# Patient Record
Sex: Female | Born: 1935
Health system: Southern US, Community
[De-identification: ages and names within clinical notes are randomized; demographics above are authoritative.]

## PROBLEM LIST (undated history)

## (undated) DIAGNOSIS — F419 Anxiety disorder, unspecified: Secondary | ICD-10-CM

## (undated) DIAGNOSIS — I779 Disorder of arteries and arterioles, unspecified: Secondary | ICD-10-CM

## (undated) DIAGNOSIS — F32A Depression, unspecified: Secondary | ICD-10-CM

## (undated) DIAGNOSIS — R002 Palpitations: Secondary | ICD-10-CM

## (undated) DIAGNOSIS — S42009A Fracture of unspecified part of unspecified clavicle, initial encounter for closed fracture: Secondary | ICD-10-CM

## (undated) DIAGNOSIS — K589 Irritable bowel syndrome without diarrhea: Secondary | ICD-10-CM

## (undated) DIAGNOSIS — I10 Essential (primary) hypertension: Secondary | ICD-10-CM

## (undated) DIAGNOSIS — F329 Major depressive disorder, single episode, unspecified: Secondary | ICD-10-CM

## (undated) DIAGNOSIS — S2249XA Multiple fractures of ribs, unspecified side, initial encounter for closed fracture: Secondary | ICD-10-CM

## (undated) DIAGNOSIS — K449 Diaphragmatic hernia without obstruction or gangrene: Secondary | ICD-10-CM

## (undated) DIAGNOSIS — K2980 Duodenitis without bleeding: Secondary | ICD-10-CM

## (undated) DIAGNOSIS — K5731 Diverticulosis of large intestine without perforation or abscess with bleeding: Secondary | ICD-10-CM

## (undated) DIAGNOSIS — I739 Peripheral vascular disease, unspecified: Secondary | ICD-10-CM

## (undated) DIAGNOSIS — K219 Gastro-esophageal reflux disease without esophagitis: Secondary | ICD-10-CM

## (undated) DIAGNOSIS — M1990 Unspecified osteoarthritis, unspecified site: Secondary | ICD-10-CM

## (undated) DIAGNOSIS — Z87442 Personal history of urinary calculi: Secondary | ICD-10-CM

## (undated) DIAGNOSIS — S2239XA Fracture of one rib, unspecified side, initial encounter for closed fracture: Secondary | ICD-10-CM

## (undated) DIAGNOSIS — E785 Hyperlipidemia, unspecified: Secondary | ICD-10-CM

## (undated) DIAGNOSIS — N390 Urinary tract infection, site not specified: Secondary | ICD-10-CM

## (undated) DIAGNOSIS — K222 Esophageal obstruction: Secondary | ICD-10-CM

## (undated) HISTORY — PX: UPPER GASTROINTESTINAL ENDOSCOPY: SHX188

## (undated) HISTORY — DX: Fracture of unspecified part of unspecified clavicle, initial encounter for closed fracture: S42.009A

## (undated) HISTORY — DX: Urinary tract infection, site not specified: N39.0

## (undated) HISTORY — DX: Disorder of arteries and arterioles, unspecified: I77.9

## (undated) HISTORY — DX: Hyperlipidemia, unspecified: E78.5

## (undated) HISTORY — DX: Unspecified osteoarthritis, unspecified site: M19.90

## (undated) HISTORY — PX: DILATION AND CURETTAGE OF UTERUS: SHX78

## (undated) HISTORY — DX: Gastro-esophageal reflux disease without esophagitis: K21.9

## (undated) HISTORY — PX: CHOLECYSTECTOMY: SHX55

## (undated) HISTORY — PX: OTHER SURGICAL HISTORY: SHX169

## (undated) HISTORY — DX: Anxiety disorder, unspecified: F41.9

## (undated) HISTORY — DX: Fracture of one rib, unspecified side, initial encounter for closed fracture: S22.39XA

## (undated) HISTORY — DX: Palpitations: R00.2

## (undated) HISTORY — DX: Essential (primary) hypertension: I10

## (undated) HISTORY — DX: Major depressive disorder, single episode, unspecified: F32.9

## (undated) HISTORY — DX: Diverticulosis of large intestine without perforation or abscess with bleeding: K57.31

## (undated) HISTORY — PX: EYE SURGERY: SHX253

## (undated) HISTORY — DX: Duodenitis without bleeding: K29.80

## (undated) HISTORY — DX: Multiple fractures of ribs, unspecified side, initial encounter for closed fracture: S22.49XA

## (undated) HISTORY — DX: Irritable bowel syndrome, unspecified: K58.9

## (undated) HISTORY — DX: Diaphragmatic hernia without obstruction or gangrene: K44.9

## (undated) HISTORY — DX: Depression, unspecified: F32.A

## (undated) HISTORY — DX: Peripheral vascular disease, unspecified: I73.9

## (undated) HISTORY — DX: Esophageal obstruction: K22.2

## (undated) HISTORY — PX: COLONOSCOPY: SHX174

---

## 1998-01-09 ENCOUNTER — Ambulatory Visit (HOSPITAL_COMMUNITY): Admission: RE | Admit: 1998-01-09 | Discharge: 1998-01-09 | Payer: Self-pay | Admitting: Urology

## 1998-01-16 ENCOUNTER — Ambulatory Visit (HOSPITAL_COMMUNITY): Admission: RE | Admit: 1998-01-16 | Discharge: 1998-01-16 | Payer: Self-pay | Admitting: Urology

## 1998-06-06 ENCOUNTER — Other Ambulatory Visit: Admission: RE | Admit: 1998-06-06 | Discharge: 1998-06-06 | Payer: Self-pay | Admitting: Obstetrics and Gynecology

## 1999-11-12 ENCOUNTER — Other Ambulatory Visit: Admission: RE | Admit: 1999-11-12 | Discharge: 1999-11-12 | Payer: Self-pay | Admitting: Obstetrics and Gynecology

## 2000-11-12 ENCOUNTER — Other Ambulatory Visit: Admission: RE | Admit: 2000-11-12 | Discharge: 2000-11-12 | Payer: Self-pay | Admitting: Obstetrics and Gynecology

## 2002-03-07 ENCOUNTER — Other Ambulatory Visit: Admission: RE | Admit: 2002-03-07 | Discharge: 2002-03-07 | Payer: Self-pay | Admitting: Family Medicine

## 2002-06-26 ENCOUNTER — Encounter: Admission: RE | Admit: 2002-06-26 | Discharge: 2002-07-20 | Payer: Self-pay | Admitting: Orthopedic Surgery

## 2002-08-01 ENCOUNTER — Encounter: Admission: RE | Admit: 2002-08-01 | Discharge: 2002-08-01 | Payer: Self-pay | Admitting: Orthopedic Surgery

## 2002-08-01 ENCOUNTER — Encounter: Payer: Self-pay | Admitting: Orthopedic Surgery

## 2003-01-19 ENCOUNTER — Inpatient Hospital Stay (HOSPITAL_COMMUNITY): Admission: RE | Admit: 2003-01-19 | Discharge: 2003-01-20 | Payer: Self-pay | Admitting: Orthopedic Surgery

## 2003-01-19 ENCOUNTER — Encounter: Payer: Self-pay | Admitting: Orthopedic Surgery

## 2003-08-13 ENCOUNTER — Other Ambulatory Visit: Admission: RE | Admit: 2003-08-13 | Discharge: 2003-08-13 | Payer: Self-pay | Admitting: Family Medicine

## 2003-08-17 ENCOUNTER — Ambulatory Visit (HOSPITAL_COMMUNITY): Admission: RE | Admit: 2003-08-17 | Discharge: 2003-08-17 | Payer: Self-pay | Admitting: Family Medicine

## 2005-08-07 ENCOUNTER — Other Ambulatory Visit: Admission: RE | Admit: 2005-08-07 | Discharge: 2005-08-07 | Payer: Self-pay | Admitting: Family Medicine

## 2005-08-11 ENCOUNTER — Ambulatory Visit: Payer: Self-pay | Admitting: Gastroenterology

## 2005-08-31 ENCOUNTER — Ambulatory Visit: Payer: Self-pay | Admitting: Gastroenterology

## 2006-05-10 ENCOUNTER — Emergency Department (HOSPITAL_COMMUNITY): Admission: EM | Admit: 2006-05-10 | Discharge: 2006-05-10 | Payer: Self-pay | Admitting: Emergency Medicine

## 2007-01-19 ENCOUNTER — Ambulatory Visit: Payer: Self-pay | Admitting: Vascular Surgery

## 2007-09-16 ENCOUNTER — Encounter: Admission: RE | Admit: 2007-09-16 | Discharge: 2007-09-16 | Payer: Self-pay | Admitting: Orthopedic Surgery

## 2007-10-13 ENCOUNTER — Inpatient Hospital Stay (HOSPITAL_COMMUNITY): Admission: RE | Admit: 2007-10-13 | Discharge: 2007-10-15 | Payer: Self-pay | Admitting: Orthopedic Surgery

## 2007-10-13 ENCOUNTER — Encounter (INDEPENDENT_AMBULATORY_CARE_PROVIDER_SITE_OTHER): Payer: Self-pay | Admitting: Orthopedic Surgery

## 2008-01-03 ENCOUNTER — Ambulatory Visit: Payer: Self-pay | Admitting: Vascular Surgery

## 2008-02-02 ENCOUNTER — Telehealth: Payer: Self-pay | Admitting: Gastroenterology

## 2008-02-03 ENCOUNTER — Ambulatory Visit: Payer: Self-pay | Admitting: Gastroenterology

## 2008-02-03 DIAGNOSIS — K921 Melena: Secondary | ICD-10-CM

## 2008-02-03 DIAGNOSIS — K219 Gastro-esophageal reflux disease without esophagitis: Secondary | ICD-10-CM

## 2008-02-03 DIAGNOSIS — K5730 Diverticulosis of large intestine without perforation or abscess without bleeding: Secondary | ICD-10-CM | POA: Insufficient documentation

## 2008-02-03 LAB — CONVERTED CEMR LAB
Eosinophils Absolute: 0.2 10*3/uL (ref 0.0–0.7)
Eosinophils Relative: 2.5 % (ref 0.0–5.0)
Ferritin: 28.2 ng/mL (ref 10.0–291.0)
HCT: 35.8 % — ABNORMAL LOW (ref 36.0–46.0)
Hemoglobin: 12.1 g/dL (ref 12.0–15.0)
MCV: 97.9 fL (ref 78.0–100.0)
Monocytes Absolute: 0.3 10*3/uL (ref 0.1–1.0)
Monocytes Relative: 5 % (ref 3.0–12.0)
Neutro Abs: 3.8 10*3/uL (ref 1.4–7.7)
RDW: 11.6 % (ref 11.5–14.6)
Saturation Ratios: 27.6 % (ref 20.0–50.0)
Vitamin B-12: 201 pg/mL — ABNORMAL LOW (ref 211–911)

## 2008-02-06 ENCOUNTER — Ambulatory Visit: Payer: Self-pay | Admitting: Gastroenterology

## 2008-02-06 ENCOUNTER — Encounter: Payer: Self-pay | Admitting: Gastroenterology

## 2008-02-08 ENCOUNTER — Telehealth: Payer: Self-pay | Admitting: Gastroenterology

## 2008-02-08 ENCOUNTER — Encounter: Payer: Self-pay | Admitting: Gastroenterology

## 2008-07-10 ENCOUNTER — Ambulatory Visit: Payer: Self-pay | Admitting: Vascular Surgery

## 2008-07-20 ENCOUNTER — Telehealth: Payer: Self-pay | Admitting: Gastroenterology

## 2008-11-20 ENCOUNTER — Encounter: Admission: RE | Admit: 2008-11-20 | Discharge: 2008-11-20 | Payer: Self-pay | Admitting: Orthopedic Surgery

## 2008-12-06 ENCOUNTER — Inpatient Hospital Stay (HOSPITAL_COMMUNITY): Admission: RE | Admit: 2008-12-06 | Discharge: 2008-12-08 | Payer: Self-pay | Admitting: Orthopedic Surgery

## 2009-01-14 ENCOUNTER — Encounter: Payer: Self-pay | Admitting: Gastroenterology

## 2009-01-15 ENCOUNTER — Ambulatory Visit: Payer: Self-pay | Admitting: Vascular Surgery

## 2009-07-30 ENCOUNTER — Ambulatory Visit: Payer: Self-pay | Admitting: Vascular Surgery

## 2010-01-27 IMAGING — CR DG SHOULDER 1V*R*
1 series · 1 of 1 positions shown · non-contrast
Comparison: CT of the right shoulder of 11/20/2008

CLINICAL DATA: Right shoulder replacement

RIGHT SHOULDER - 1 VIEW

[view not recorded]
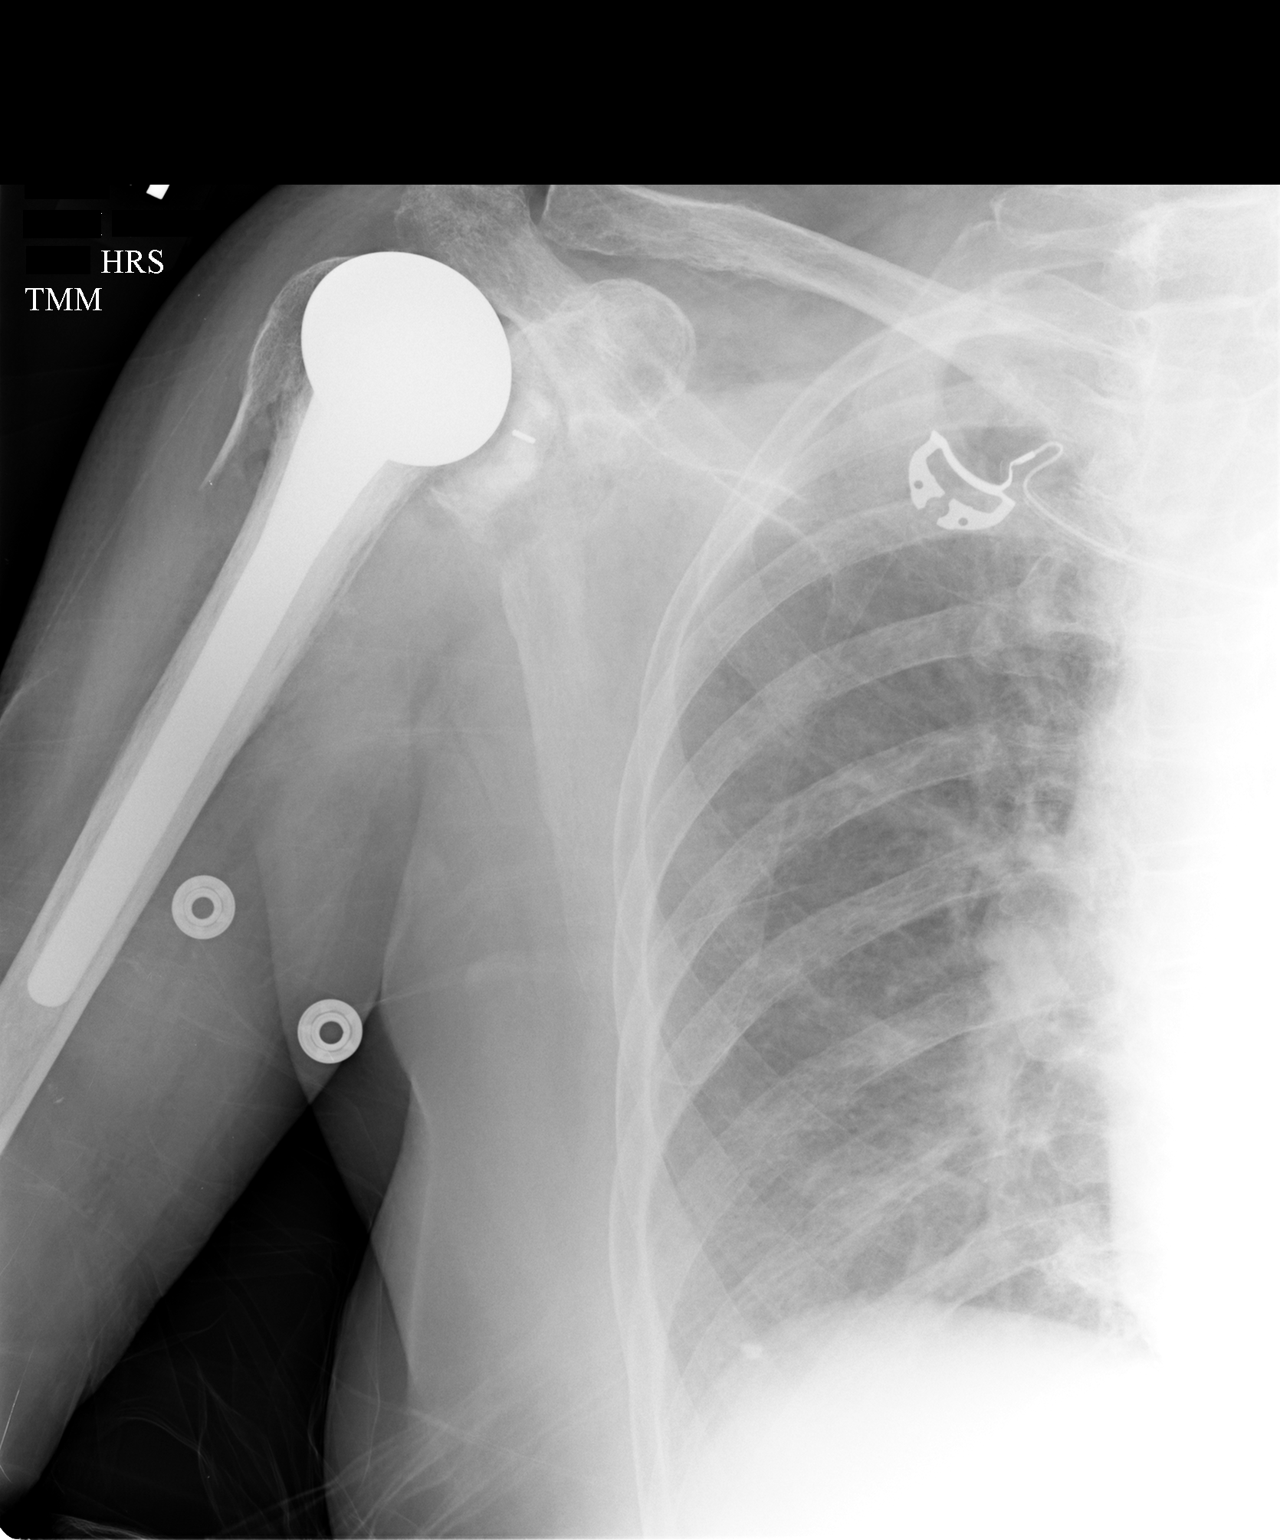

[1 of 1 positions shown; findings below may reference images not displayed]

FINDINGS: Right humeral head prosthesis is now present.  There is
however fracture of the lateral right humeral head and neck and
greater tuberosity.  The humeral head prosthesis appears to be in
good position.  The bones are diffusely osteopenic.
IMPRESSION: Right humeral head prosthesis present.  There is fracture of the
lateral right humeral head and neck probably involving the greater
tuberosity.

## 2010-01-28 ENCOUNTER — Ambulatory Visit: Payer: Self-pay | Admitting: Vascular Surgery

## 2010-05-01 ENCOUNTER — Ambulatory Visit: Payer: Self-pay | Admitting: Cardiology

## 2010-08-29 ENCOUNTER — Ambulatory Visit: Payer: Self-pay | Admitting: Cardiology

## 2010-09-01 ENCOUNTER — Ambulatory Visit: Payer: Self-pay | Admitting: Vascular Surgery

## 2010-11-19 ENCOUNTER — Ambulatory Visit (INDEPENDENT_AMBULATORY_CARE_PROVIDER_SITE_OTHER): Payer: Medicare Other | Admitting: Cardiology

## 2010-11-19 DIAGNOSIS — Z79899 Other long term (current) drug therapy: Secondary | ICD-10-CM

## 2010-11-19 DIAGNOSIS — E7800 Pure hypercholesterolemia, unspecified: Secondary | ICD-10-CM

## 2010-11-19 DIAGNOSIS — I495 Sick sinus syndrome: Secondary | ICD-10-CM

## 2010-12-04 ENCOUNTER — Ambulatory Visit (INDEPENDENT_AMBULATORY_CARE_PROVIDER_SITE_OTHER): Payer: Medicare Other | Admitting: Cardiology

## 2010-12-04 DIAGNOSIS — E7800 Pure hypercholesterolemia, unspecified: Secondary | ICD-10-CM

## 2011-01-08 LAB — DIFFERENTIAL
Eosinophils Relative: 2 % (ref 0–5)
Lymphocytes Relative: 31 % (ref 12–46)
Lymphs Abs: 2.7 10*3/uL (ref 0.7–4.0)
Monocytes Absolute: 0.7 10*3/uL (ref 0.1–1.0)
Monocytes Relative: 8 % (ref 3–12)

## 2011-01-08 LAB — BASIC METABOLIC PANEL
BUN: 20 mg/dL (ref 6–23)
CO2: 26 mEq/L (ref 19–32)
Calcium: 8.9 mg/dL (ref 8.4–10.5)
Calcium: 9 mg/dL (ref 8.4–10.5)
Creatinine, Ser: 0.63 mg/dL (ref 0.4–1.2)
Creatinine, Ser: 0.78 mg/dL (ref 0.4–1.2)
GFR calc Af Amer: >60 mL/min (ref >60)
GFR calc non Af Amer: 56 mL/min — ABNORMAL LOW (ref >60)
GFR calc non Af Amer: >60 mL/min (ref >60)
Glucose, Bld: 74 mg/dL (ref 70–99)
Potassium: 4.1 mEq/L (ref 3.5–5.1)
Sodium: 140 mEq/L (ref 135–145)
Sodium: 142 mEq/L (ref 135–145)

## 2011-01-08 LAB — CBC
HCT: 35.9 % — ABNORMAL LOW (ref 36.0–46.0)
Hemoglobin: 12.5 g/dL (ref 12.0–15.0)
MCHC: 35.7 g/dL (ref 30.0–36.0)
Platelets: 273 10*3/uL (ref 150–400)
Platelets: 288 10*3/uL (ref 150–400)
RBC: 3.16 MIL/uL — ABNORMAL LOW (ref 3.87–5.11)
RDW: 12.4 % (ref 11.5–15.5)
RDW: 12.8 % (ref 11.5–15.5)
WBC: 8.6 10*3/uL (ref 4.0–10.5)

## 2011-01-08 LAB — URINALYSIS, ROUTINE W REFLEX MICROSCOPIC
Glucose, UA: NEGATIVE mg/dL
Protein, ur: NEGATIVE mg/dL
pH: 5 (ref 5.0–8.0)

## 2011-01-08 LAB — GLUCOSE, CAPILLARY: Glucose-Capillary: 100 mg/dL — ABNORMAL HIGH (ref 70–99)

## 2011-01-08 LAB — TYPE AND SCREEN: ABO/RH(D): O POS

## 2011-01-27 ENCOUNTER — Other Ambulatory Visit: Payer: Self-pay | Admitting: *Deleted

## 2011-01-27 DIAGNOSIS — I10 Essential (primary) hypertension: Secondary | ICD-10-CM

## 2011-01-27 MED ORDER — DILTIAZEM HCL ER COATED BEADS 240 MG PO CP24: 240.0000 mg | ORAL_CAPSULE | Freq: Every day | ORAL | Status: DC

## 2011-01-27 NOTE — Telephone Encounter (Signed)
Refilled meds per fax request.  

## 2011-02-10 NOTE — Discharge Summary (Signed)
NAME:  Valerie [name], Valerie [name]                  ACCOUNT NO.:  [id number]   MEDICAL RECORD NO.:  [id number]          PATIENT TYPE:  INP   LOCATION:  5020                         FACILITY:  [facility]   PHYSICIAN:  Almedia Balls. Ranell Patrick, M.D. DATE OF BIRTH:  Nov 16, 1927   DATE OF ADMISSION:  12/06/2008  DATE OF DISCHARGE:  12/07/2008                               DISCHARGE SUMMARY   ADMISSION DIAGNOSIS:  Right shoulder end-stage osteoarthritis.   DISCHARGE DIAGNOSIS:  Right shoulder end-stage osteoarthritis status  post total shoulder arthroplasty.   BRIEF HISTORY:  The patient is a 77 year old female with worsening right  shoulder pain secondary to end-stage osteoarthritis.  The patient  elected to have a total shoulder replacement.   PROCEDURE:  The patient had a right total shoulder arthroplasty by Dr.  Malon Kindle on December 06, 2008.  Assistant was [employer], PA-C.  She  had general anesthesia plus interscalene block and no complications.   HOSPITAL COURSE:  The patient was admitted on December 06, 2008 for the  above-stated procedure which she tolerated well.  After adequate time in  Postanesthesia Care Unit, she was transferred up to 5000.  Postop day  #1, the patient complained of some minimal pain earlier this morning but  completely relieved with oral pain medicine.  The patient was doing  quite well, actually working with physical therapy quite well and want  to go home to recover more at home which was adequate considering her  labs were within normal limits.  She had no other complications.   DISCHARGE PLAN:  The patient will be discharged home on December 07, 2008.   CONDITION ON DISCHARGE:  Stable.   DIET:  Regular.   ALLERGIES:  The patient has allergies to SULFA and [employer] INHIBITORS.   DISCHARGE MEDICATIONS:  1. Robaxin 500 mg p.o. q.6 h.  2. Norco 1-2 tablets q.4-6 h. p.r.n. pain and the dose is 5/325.   FOLLOWUP:  The patient will follow back up with Dr. Malon Kindle in 2   weeks.      Thomas B. Durwin Nora, P.A.      Almedia Balls. Ranell Patrick, M.D.  Electronically Signed    TBD/MEDQ  D:  12/07/2008  T:  12/07/2008  Job:  [id number]

## 2011-02-10 NOTE — Procedures (Signed)
CAROTID DUPLEX EXAM   INDICATION:  Carotid stenosis   HISTORY:  Diabetes:  No  Cardiac:  Murmur  Hypertension:  Yes  Smoking:  No  Previous Surgery:  No  CV History:  Asymptomatic  Amaurosis Fugax No, Paresthesias No, Hemiparesis  No                                       RIGHT             LEFT  Brachial systolic pressure:         140               140  Brachial Doppler waveforms:         M                 Normal  Vertebral direction of flow:        Antegrade         Antegrade  DUPLEX VELOCITIES (cm/sec)  CCA peak systolic                   69                72  ECA peak systolic                   72                88  ICA peak systolic                   162 in the mid/distal               178 in the mid/distal  ICA end diastolic                   39                39  PLAQUE MORPHOLOGY:                  Calcific          Calcific  PLAQUE AMOUNT:                      Moderate          Moderate  PLAQUE LOCATION:                    ICA/ECA           ICA   IMPRESSION:  1. Doppler velocities suggests 40% to 59% stenosis in bilateral      internal carotid arteries; however, vessels are tortuous in the      areas of highest stenosis.  2. Antegrade flow noted in bilateral vertebral arteries.  3. No significant changes from previous exams.   ___________________________________________  [name]. Edilia Bo, M.D.   NT/MEDQ  D:  09/01/2010  T:  09/01/2010  Job:  [id number]

## 2011-02-10 NOTE — Procedures (Signed)
CAROTID DUPLEX EXAM   INDICATION:  Followup, carotid artery disease.   HISTORY:  Diabetes:  No.  Cardiac:  Murmur.  Hypertension:  Yes.  Smoking:  No.  Previous Surgery:  No.  CV History:  Asymptomatic.  Amaurosis Fugax No, Paresthesias No, Hemiparesis No                                       RIGHT             LEFT  Brachial systolic pressure:         142               140  Brachial Doppler waveforms:         Biphasic          Biphasic  Vertebral direction of flow:        Antegrade         Antegrade  DUPLEX VELOCITIES (cm/sec)  CCA peak systolic                   77                105  ECA peak systolic                   100               110  ICA peak systolic                   P = 79, D = 143   214  ICA end diastolic                   P = 17, D = 33    54  PLAQUE MORPHOLOGY:                  Mixed             Calcified  PLAQUE AMOUNT:                      Mild              Moderate  PLAQUE LOCATION:                    ICA               ICA   IMPRESSION:  1. Bilateral tortuous internal carotid arteries.  2. Right internal carotid artery shows evidence of minimal stenosis,      proximal to mid; however, distally after sharp turn, velocities are      suggestive of 40-59% stenosis.  3. Left internal carotid artery shows evidence of 60-79% stenosis,      showing an increase from the previous study.   ___________________________________________  [name]. Edilia Bo, M.D.   AS/MEDQ  D:  01/03/2008  T:  01/03/2008  Job:  [id number]

## 2011-02-10 NOTE — Procedures (Signed)
CAROTID DUPLEX EXAM   INDICATION:  Follow up known carotid disease.   HISTORY:  Diabetes:  No.  Cardiac:  Murmur.  Hypertension:  Yes.  Smoking:  No.  Previous Surgery:  No.  CV History:  No.  Amaurosis Fugax No, Paresthesias No, Hemiparesis No.                                       RIGHT             LEFT  Brachial systolic pressure:         190               180  Brachial Doppler waveforms:         Within normal limits                Within normal limits  Vertebral direction of flow:        Antegrade         Antegrade  DUPLEX VELOCITIES (cm/sec)  CCA peak systolic                   72                87  ECA peak systolic                   78                80  ICA peak systolic                   210               232  ICA end diastolic                   45                22  PLAQUE MORPHOLOGY:                  Calcific, heterogenous              Heterogenous  PLAQUE AMOUNT:                      Mild-to-moderate  Moderate  PLAQUE LOCATION:                    ICA/ECA/Bulb      ICA   IMPRESSION:  1. The right internal carotid artery suggests 40-59% stenosis at mid      level of the internal carotid artery.  2. The left internal carotid artery suggests 40-59% stenosis.  3. Bilateral vertebrals noted with antegrade flow.   ___________________________________________  [name]. Edilia Bo, M.D.   CB/MEDQ  D:  07/30/2009  T:  07/30/2009  Job:  [id number]

## 2011-02-10 NOTE — Procedures (Signed)
CAROTID DUPLEX EXAM   INDICATION:  Followup carotid artery disease.   HISTORY:  Diabetes:  No.  Cardiac:  Murmur.  Hypertension:  Yes.  Smoking:  No.  Previous Surgery:  No.  CV History:  Asymptomatic.  Amaurosis Fugax No, Paresthesias No, Hemiparesis No                                       RIGHT             LEFT  Brachial systolic pressure:         142               140  Brachial Doppler waveforms:         WNL               WNL  Vertebral direction of flow:        Antegrade         Antegrade  DUPLEX VELOCITIES (cm/sec)  CCA peak systolic                   78                92  ECA peak systolic                   95                109  ICA peak systolic                   200               270  ICA end diastolic                   41                54  PLAQUE MORPHOLOGY:                  Calcific          Calcific  PLAQUE AMOUNT:                      Moderate          Moderate  PLAQUE LOCATION:                    ICA / ECA / CCA   ICA / ECA / CCA   IMPRESSION:  1. Right internal carotid artery shows evidence of 40%-59% stenosis      mid / distal.  2. Left internal carotid artery shows evidence of 60%-79% stenosis mid      / distal.  3. Bilateral internal carotid arteries are tortuous.  4. No significant changes in internal carotid artery velocities from      previous study.   ___________________________________________  [name]. Edilia Bo, M.D.   AS/MEDQ  D:  01/29/2010  T:  01/29/2010  Job:  [id number]

## 2011-02-10 NOTE — Procedures (Signed)
CAROTID DUPLEX EXAM   INDICATION:  Follow up of known coronary artery disease.   HISTORY:  Diabetes:  No.  Cardiac:  Murmur.  Hypertension:  Yes.  Smoking:  No.  Previous Surgery:  No.  CV History:  No.  Amaurosis Fugax No, Paresthesias No, Hemiparesis No.                                       RIGHT             LEFT  Brachial systolic pressure:         156               148  Brachial Doppler waveforms:         Triphasic         Biphasic  Vertebral direction of flow:        Antegrade         Antegrade  DUPLEX VELOCITIES (cm/sec)  CCA peak systolic                   85                108  ECA peak systolic                   82                110  ICA peak systolic                   78                250  ICA end diastolic                   21                52  PLAQUE MORPHOLOGY:                  Heterogenous      Heterogenous  PLAQUE AMOUNT:                      Mild              Moderate  PLAQUE LOCATION:                    BIF, ICA          BIF, ICA   IMPRESSION:  1. 20-39% right internal carotid artery stenosis; however, light      increase in velocity of 128 cm/s noted at the distal right internal      carotid artery.  2. Left proximal to mid internal carotid artery velocities suggest 40-      59% stenosis.  3. Bilateral tortuous internal carotid artery noted.   ___________________________________________  [name]. Edilia Bo, M.D.   AC/MEDQ  D:  01/15/2009  T:  01/15/2009  Job:  [id number]

## 2011-02-10 NOTE — Assessment & Plan Note (Signed)
OFFICE VISIT   Valerie Valerie [name], Valerie Valerie [name]  DOB:  04-Aug-1928                                       01/03/2008  ZOXWR#:[id number]   I saw the patient in the office today for continued followup of her  carotid disease.  Since I saw her last in April of 2007 she has had no  history of stroke, TIAs, expressive or receptive aphasia or amaurosis  fugax .   REVIEW OF SYSTEMS:  On review of systems she has had no chest pain,  chest pressure, palpitations or arrhythmias.  She has had no bronchitis,  asthma or wheezing.   SOCIAL HISTORY:  On social history she is not Valerie [name] smoker.   PHYSICAL EXAMINATION:  This is Valerie [name] pleasant 77 year old woman who appears  her stated age.  Blood pressure is 145/61, heart rate is 68.  She has  bilateral carotid bruits.  Lungs are clear bilaterally to auscultation.  On cardiac exam she has Valerie [name] regular rate and rhythm.   [name]. Edilia Bo, M.D.  Electronically Signed   CSD/MEDQ  D:  01/03/2008  T:  01/04/2008  Job:  [id number]

## 2011-02-10 NOTE — Op Note (Signed)
NAME:  Valerie [name], Valerie [name]                  ACCOUNT NO.:  [id number]   MEDICAL RECORD NO.:  [id number]          PATIENT TYPE:  INP   LOCATION:  2550                         FACILITY:  [facility]   PHYSICIAN:  Almedia Balls. Ranell Patrick, M.D. DATE OF BIRTH:  15-Dec-1927   DATE OF PROCEDURE:  DATE OF DISCHARGE:                               OPERATIVE REPORT   PREOPERATIVE DIAGNOSIS:  Right shoulder osteoarthritis, end stage.   POSTOPERATIVE DIAGNOSIS:  Right shoulder osteoarthritis, end stage.   PROCEDURE PERFORMED:  Right total shoulder replacement using DePuy  prosthesis, is a [employer] prosthesis with a keeled glenoid.   ATTENDING SURGEON:  Almedia Balls. Ranell Patrick, MD   ASSISTANT:  Donnie Coffin. [place], PA-C   ANESTHESIA:  General anesthesia was used plus interscalene block.   ESTIMATED BLOOD LOSS:  100 mL.   FLUID REPLACEMENT:  1500 mL crystalloid.   URINE OUTPUT:  250 mL.   INSTRUMENT COUNTS:  Correct.   COMPLICATIONS:  Perioperative antibiotics were given.   INDICATIONS:  The patient is a 77 year old female with a history of  right shoulder arthritis.  The patient has failed conservative  management consisting of injections, activity modifications, anti-  inflammatories, and the patient presents now for operative treatment to  restore function and eliminate pain.  She has had a prior total shoulder  in the left and is doing well with that.  Informed consent was obtained.   DESCRIPTION OF PROCEDURE:  After an adequate level of anesthesia was  achieved, the patient was positioned in a modified beach chair position.  Right shoulder was sterilely prepped and draped in usual manner.  Deltopectoral approach was utilized starting at the coracoid down the  anterior humeral shaft.  Dissection down through the subcutaneous  tissues using Bovie, cephalic vein identified and taken laterally with  the deltoid.  The pectoralis was taken medially.  Conjoint tendon taken  medially.  Upper pec was released about  a centimeter width.  We  performed a release of the subscapularis and placed two #2 FiberWire  sutures in that tendon in a modified Mason-Allen technique.  We freed up  from the underlying capsule and from the coracoid process.  We released  the soft tissue off the humeral shaft and then progressively externally  rotated the humerus freeing that up.  We then went ahead and made a neck  cut with about 10-15 degrees of retroversion using a neck cut guide.  Once we have done that, we prepared the humerus with sequential reaming  up to size 10.  We broached up to a size 10 and left the broach in place  to protect the humeral bone.  We then did soft tissue releases about the  glenoids.  We had 360 degree exposure moving the glenoid labrum.  We had  a 360 degree look and at this point marked center of glenoid which was  sized to 40, and we went ahead and drilled that central hole.  We then  went ahead and reamed with a 40 reamer.  Next, we went ahead and drilled  our superior 12 o'clock and  6 o'clock hole for the keeled glenoid and  then used rongeur to remove excess bone.  We used a keel punch and then  placed a 40 keel trial.  We then went ahead and placed on a 44 x 18  eccentric humeral head component directly up towards the rotator cuff  and little bit posteriorly.  We then reduced the shoulder and the  shoulder was attached on tight side.  We made a few more releases around  the posterior aspect of the shoulder that seemed to loosen up a little  bit better.  We were happy with our version, we had removed all trial  components.  It should be noted that at this stage, we were starting to  notice that there was some crumbling and some bone loss around the  posterior aspect of the humerus and a crack in the greater tuberosity.  I was concerned about this.  We made sure we freed up the rotator cuff  from under the acromion really well and freed it up off the scapular  neck.  At this point, we  went ahead and placed #2 FiberWires into the  rotator cuff tendon just lateral to the greater tuberosity.  Placed also  #2 FiberWire through the lesser tuberosity and then went ahead and  cemented the humeral stem in place with appropriate version.  Once that  was allowed to harden, then we displaced the shoulder posteriorly and  then cemented the glenoid in place after drying out the glenoid vault  thoroughly.  Once that cement was allowed to harden, we placed a 44 x 18  dilating it towards posterosuperiorly and we were happy with that.  We  reduced the shoulder and we had a nice soft tissue balance.  We then  bone grafted extensively the anterior shaft up around the greater  tuberosity where it cracked a little and where the subscapularis was  going to come in we placed 3 #2 FiberWire sutures in the rotator  interval.  We tied the subscapularis suture to the greater tuberosity  sutures and this closed down the anterior defect nicely.  We used a  quarter-inch curved osteotome to remove excess cement.  We thoroughly  irrigated prior to closing.  We had a nice soft tissue repair gaining  anatomic position of the subscapularis and the rotator cuff and greater  tuberosity.  At this point, we took the shoulder through a full range of  motion.  No relative motion was noted between the tuberosity and the  subscapularis, and we had excellent soft tissue balance.  At this point,  we concluded the surgery, suturing the wounds with 0 Vicryl, 2-0 Vicryl,  and 4-0 Monocryl.      Almedia Balls. Ranell Patrick, M.D.  Electronically Signed     SRN/MEDQ  D:  12/06/2008  T:  12/07/2008  Job:  [id number]

## 2011-02-10 NOTE — Op Note (Signed)
NAME:  Valerie [name], Valerie [name]                  ACCOUNT NO.:  [id number]   MEDICAL RECORD NO.:  [id number]          PATIENT TYPE:  OIB   LOCATION:  2550                         FACILITY:  [facility]   PHYSICIAN:  Almedia Balls. Ranell Patrick, M.D. DATE OF BIRTH:  30-Sep-1927   DATE OF PROCEDURE:  10/13/2007  DATE OF DISCHARGE:                               OPERATIVE REPORT   PREOPERATIVE DIAGNOSIS:  Left shoulder painful hemiarthroplasty.   POSTOPERATIVE DIAGNOSIS:  Left shoulder painful hemiarthroplasty.   PROCEDURE PERFORMED:  Left shoulder conversion to total shoulder  arthroplasty/revision arthroplasty.   ATTENDING SURGEON:  Dr. Malon Kindle   ASSISTANT:  Donnie Coffin. Durwin Nora, P.A.   ANESTHESIA:  General anesthesia plus interscalene block anesthesia was  used.   ESTIMATED BLOOD LOSS:  Minimal.   FLUIDS REPLACED:  1800 mL crystalloids.   URINE OUTPUT:  200 mL.   INSTRUMENT COUNT:  Correct.   COMPLICATIONS:  None.   Perioperative antibiotics were given.   INDICATIONS:  The patient is a 77 year old female with a history of a  shoulder hemiarthroplasty done for arthritis 3 years ago.  The patient  has had pain and somewhat limited function with the shoulder since her  surgery.  It has been progressive recently to the point where she is  desiring conversion to total shoulder arthroplasty.  The risks and  benefits of the surgery were discussed.  She would like to proceed.  Informed consent was obtained.   DESCRIPTION OF PROCEDURE:  After an adequate level of anesthesia was  achieved, the patient was positioned in the modified beach chair  position.  The shoulder was examined under anesthesia.  The patient had  a benign wound with no signs of infection and no swelling.  Reported  elevation was limited to about 100 degrees and external rotation was out  to about 10 to 15.  Internal rotation was to her abdomen.  After sterile  prep and drape of the left shoulder and arm, we entered the shoulder  through  the prior deltopectoral approach.  The cephalic vein  was not  visualized.  We identified the deltopectoral interval and divided that  from the coracoid process down to the anterior humeral attachment of the  deltoid.  We freed up the deltoid off the rotator cuff.  We freed off  the conjoined tendon off the subscapularis and retracted the conjoined  medially and the deltoid laterally.  The patient's rotator cuff had been  scarred up to under the deltoid, and the subscapular scarred up under  the conjoined tendon and up to the coracoid.  We freed all of that off  so that the subscapular would glide smoothly and the rotator cuff would  glide smoothly.  The rotator cuff was intact.  The subscapular was  intact.  We then made an incision outlining the subscapularis taking off  as far laterally as possible near the bicipital groove.  We peeled that  off as a full thickness soft tissue sleeve starting in the proximal  corner near the rotator interval.  We peeled that down keeping the  inferior attachment intact.  We placed a #2 barbwire suture in [employer] suture technique with the 2 strands coming out over the top of the  subscapular tendon.  We removed the synovitis that was encountered.  We  freed up the subscapular from the scapular neck so that we could  visualize the scapular neck.  I palpated the axillary nerve which was  out of the way medially.  Next, we went ahead and knocked off the  humeral head which was in good condition.  There was some synovitis  underneath the head which we removed.  This was a 44x18 eccentric head  from the [employer] system.  We next went ahead and retracted the  humerus posteriorly and then obtained a 360-degree exposure of the  glenoid surface.  This was sized to a size 40.  We could not get a 44 on  there due to it not being big enough.  There was an inferior osteophyte  which was removed.  At this point we went ahead and noted there to be  some  erosion in the glenoid side but otherwise preserved bone stock.  We  drilled our central hole for the glenoid after identifying the available  anatomy to determine where the 12:00, 6:00, 3:00, and 9:00 positions  were.  Once I drilled the central hole, we tried the anchor peg glenoid  drill guide on there, but it looked like the anterior inferior hole was  going to drill directly into the edge of the glenoid and potentially  compromise the edge support anterior inferiorly; thus, we went with the  guide for the key-holed glenoid.  We drilled the [place] and south holes  for that and then used a bur to open up the central portion and then the  tap to prepare the glenoid vault for the keeled glenoid.  We trialed the  40 keeled glenoid.  This was seated nicely.  We had reamed also with the  40 reamer once we had the central hole in down to bleeding bone.  With  the glenoid reams prepared for the keeled glenoid, we went ahead and  mixed the P1cement and then cemented the 40 glenoid in place.  This was  all the way down to bone nicely.  We removed excess cement, and once it  was allowed to harden, we went ahead and trialed the 15 and 18 humeral  head trials.  The 18 had better soft tissue balancing being able to  translate at 50 percent anteroposteriorly as well as inferiorly.  We  selected the same head that we had removed, a 44x18 eccentric, dialed  that  around posterior superiorly and impacted it in place.  Prior to  that we had actually placed two #2 barbwire sutures in the left  tuberosity and brought those up through the subscapular.  The  subscapular with those sutures through bone also oversewing the rotator  interval and anterior superior corner and taking the Mason-Allen suture  and tying that over the top to the rotator cuff in a mattress fashion.  We made a nice, secure subscapular repair which we were easily able to  externally rotate out to about 25 degrees with no gapping or  tension,  internal rotation to the abdomen without problems and excellent range of  motion.  We had irrigated everything prior to closing.  We then closed  the deltopectoral interval with 0 Vicryl suture followed by 2-0 Vicryl  subcutaneous closure and 4-0 Monocryl for skin.  The patient  tolerated  the surgery well.      Almedia Balls. Ranell Patrick, M.D.  Electronically Signed     SRN/MEDQ  D:  10/13/2007  T:  10/13/2007  Job:  [id number]

## 2011-02-10 NOTE — Procedures (Signed)
CAROTID DUPLEX EXAM   INDICATION:  Followup carotid artery disease.   HISTORY:  Diabetes:  No.  Cardiac:  Murmur.  Hypertension:  Yes.  Smoking:  No.  Previous Surgery:  No.  CV History:  No.  Amaurosis Fugax No, Paresthesias No, Hemiparesis No                                       RIGHT             LEFT  Brachial systolic pressure:         140               144  Brachial Doppler waveforms:         Biphasic          Biphasic  Vertebral direction of flow:        Antegrade         Antegrade  DUPLEX VELOCITIES (cm/sec)  CCA peak systolic                   64                85  ECA peak systolic                   92                113  ICA peak systolic                   P=58, D=116       228  ICA end diastolic                   P=17, D=20        50  PLAQUE MORPHOLOGY:                  Mixed             Calcified  PLAQUE AMOUNT:                      Mild              Moderate  PLAQUE LOCATION:                    ICA               ICA/CCA   IMPRESSION:  1. Bilateral tortuous ICAs.  2. Right ICA shows evidence of minimal stenosis proximal to mid,      however, distally velocities are suggestive of 40-59% stenosis.  3. Left ICA shows evidence of 60-79% stenosis.  4. No significant changes from previous study.   ___________________________________________  [name]. Edilia Bo, M.D.   AS/MEDQ  D:  07/10/2008  T:  07/10/2008  Job:  [id number]

## 2011-02-10 NOTE — H&P (Signed)
NAME:  Valerie [name], Valerie [name]                  ACCOUNT NO.:  [id number]   MEDICAL RECORD NO.:  [id number]          PATIENT TYPE:  INP   LOCATION:  NA                           FACILITY:  [facility]   PHYSICIAN:  Almedia Balls. Ranell Patrick, M.D. DATE OF BIRTH:  1927/10/14   DATE OF ADMISSION:  DATE OF DISCHARGE:                              HISTORY & PHYSICAL   CHIEF COMPLAINT:  Right shoulder pain.   HISTORY OF PRESENT ILLNESS:  The patient is a 77 year old female with  worsening right shoulder pain secondary to osteoarthritis.  The patient  elected to have a right total shoulder arthroplasty by Dr. Malon Kindle.   PAST MEDICAL HISTORY:  Hypertension.   FAMILY MEDICAL HISTORY:  Negative.   SOCIAL HISTORY:  The patient is married.  The patient of Dr. Paulene Floor.  She does not smoke or use alcohol.   ALLERGIES:  SULFA.   CURRENT MEDICATIONS:  1. Omeprazole 40 mg p.o. daily.  2. Pravastatin 20 mg p.o. daily.  3. Metoprolol 50 mg p.o. daily.  4. TriCor 145 mg p.o. daily.  5. Hydrochlorothiazide 25 mg p.o. daily.  6. Diltiazem 240 mg p.o. daily.  7. Ramipril 10 mg p.o. daily.  8. Alprazolam 0.25 mg t.i.d. p.r.n.   REVIEW OF SYSTEMS:  Painful range of motion of right upper extremity.   PHYSICAL EXAMINATION:  VITAL SIGNS:  Pulse 76, respirations 16, and  blood pressure 108/68.  GENERAL:  The patient is healthy-appearing 77 year old female in no  acute distress.  Pleasant mood and affect, oriented x3.  HEENT/NECK:  Full range of motion without difficulty.  Cranial nerves II  through XII grossly intact.  CHEST:  Active breath sounds bilaterally.  No wheezes, rhonchi, or  rales.  HEART:  Regular rate and rhythm.  No murmur.  ABDOMEN:  Nontender, nondistended with active bowel sounds.  EXTREMITIES:  Shoulder exam shows mild tenderness with range of motion  of shoulder shows forward flexion 70 degrees, external rotation of 10  degrees, internal rotation __________.  Capillary refill less 2 seconds  distally.  NEUROLOGIC:  She is intact.  SKIN:  No rashes or edema.   X-rays show end-stage osteoarthritis of the right shoulder.   IMPRESSION:  End-stage osteoarthritis of the right shoulder.   PLAN:  Plan of action is to have right total shoulder arthroplasty by  Dr. Malon Kindle.      Thomas B. Durwin Nora, P.A.      Almedia Balls. Ranell Patrick, M.D.  Electronically Signed    TBD/MEDQ  D:  11/30/2008  T:  12/01/2008  Job:  [id number]

## 2011-02-13 NOTE — Discharge Summary (Signed)
NAME:  GIOVANNINA, MUN                  ACCOUNT NO.:  [id number]   MEDICAL RECORD NO.:  [id number]          PATIENT TYPE:  INP   LOCATION:  5028                         FACILITY:  [facility]   PHYSICIAN:  Almedia Balls. Ranell Patrick, M.D. DATE OF BIRTH:  [birth year]   DATE OF ADMISSION:  10/13/2007  DATE OF DISCHARGE:  10/15/2007                               DISCHARGE SUMMARY   ADMITTING DIAGNOSES:  1. Left shoulder pain following shoulder hemiarthroplasty with      worsening arthritis.  2. Hypertension.  3. Hyperlipidemia.   DISCHARGE DIAGNOSES:  1. Left shoulder pain following shoulder hemiarthroplasty with      worsening arthritis.  2. Hypertension.  3. Hyperlipidemia.   PROCEDURE PERFORMED:  Conversion of left shoulder hemiarthroplasty to  total shoulder arthroplasty performed on September 30, 2007, by Dr. Malon Kindle.   CONSULTING SERVICE:  Occupational therapy, discharge planning.   HISTORY OF PRESENT ILLNESS:  The patient is a 77 year old female with a  history of left shoulder pain following a shoulder hemiarthroplasty done  several years ago for arthritis.  The patient had some pain relief but  has had progressive pain recently felt likely due to gradual erosion of  her remaining cartilage on her glenoid side.  The patient's humeral  prosthesis appeared to be in good position, well-fixed, no signs of  infection.  However, due to the patient's persistent pain and functional  deficits, the patient elected to proceed with conversion to total  shoulder arthroplasty replacing the glenoid.  For further details of the  patient's past medical history and physical examination, please see the  medical record.   HOSPITAL COURSE:  The patient admitted to orthopedics on October 13, 2007, and taken to surgery on January 15 for a total shoulder  replacement.  The patient had a hemiarthroplasty converted to total  shoulder and did well with surgery, postoperatively taken to the floor  where she  remained afebrile and was tolerating a regular diet.  Labs  were stable.  Vitals were stable.  The patient was instructed with  occupational therapy on the appropriate exercises and sling-wear.  The  patient was stable for discharge on October 15, 2007, discharged in  stable condition, a regular diet with her preadmission medications as  well as Percocet and Robaxin and will have instructions for home health  therapy and will be following up with [place] in 2 weeks.      Almedia Balls. Ranell Patrick, M.D.  Electronically Signed     SRN/MEDQ  D:  11/05/2007  T:  11/07/2007  Job:  [id number]

## 2011-02-13 NOTE — H&P (Signed)
NAME:  Valerie [name], Valerie [name] NO.:  [id number]   MEDICAL RECORD NO.:  [id number]                   PATIENT TYPE:   LOCATION:                                       FACILITY:   PHYSICIAN:  Almedia Balls. Ranell Patrick, M.D.              DATE OF BIRTH:  02/04/1928   DATE OF ADMISSION:  01/19/2003  DATE OF DISCHARGE:                                HISTORY & PHYSICAL   CHIEF COMPLAINT:  Pain in my left shoulder.   HISTORY OF PRESENT ILLNESS:  This 77 year old lady seen by Dr. Almedia Balls.  Norris for continuing and progressive problems concerning her left shoulder.  The patient has had problems with the shoulder for some time now, and most  recently it has begun to markedly interfere with her day-to-day activities  as well as sleep.  She has difficulty getting in comfortable position and  essentially cannot use the left arm other than at waist level.  She has  developed limited range of motion into the left shoulder.  She has  significant osteoarthritis by MRI of the humerus, and large loose body is  seen as well.  She also has rather marked superior labral tear.  She has had  several injections into the shoulder which only have been giving her short-  term relief.  After much discussion and failure of conservative care, it was  felt this patient would benefit from surgical intervention and is scheduled  for a left shoulder hemiarthroplasty.  This again is due to the advanced  glenohumeral arthritis.   The patient is seen in [facility] in [place].  She  last saw Dr. Reola Calkins, who noticed a heart murmur and has asked Dr. Ronny Flurry to give her cardiac clearance.  She will be seen by Dr. Patty Sermons  this coming Friday on April 16.   PAST MEDICAL HISTORY:  1. History of anxiety and depression.  2. History of hypertension.  3. History of hiatal hernia with reflux disease.   CURRENT MEDICATIONS:  1. Cardizem LA 240 mg 1 daily.  2. Paxil CR 25 mg 1  daily.  3. TriCor 160 mg 1 daily.  4. Nexium 40 mg 1 daily.  5. Pravachol 10 mg 1 daily.  6. Aspirin 81 mg 1 a day (will stop prior to surgery).  7. Darvocet for pain.   ALLERGIES:  According to records, she has no medical allergies.   PAST SURGICAL HISTORY:  Cholecystectomy in 1970.   FAMILY HISTORY:  Positive for colon cancer in father.  Her sister had a  stroke.  The father also had ulcer disease.   SOCIAL HISTORY:  The patient is married x 54 years.  She is currently  retired.  No intake of tobacco or alcohol products.  She has four children,  and her husband will be care giver after the surgery.   REVIEW OF SYSTEMS:  CNS:  No seizures, paralysis, numbness, double vision.  RESPIRATORY:  No productive cough, no hemoptysis, no shortness of breath.  CARDIOVASCULAR:  No chest pain, no angina, no orthopnea.  GASTROINTESTINAL:  No nausea, vomiting, melena, bloody stool.  The patient does well with her  current medications for reflux.  GENITOURINARY:  No discharge or hematuria.  MUSCULOSKELETAL:  Primarily as in History of Present Illness with her left  shoulder.  She also has some pain and discomfort in the right shoulder but  not nearly as bad as the left.   PHYSICAL EXAMINATION:  GENERAL:  Alert and cooperative, friendly, 74-year-  old white female who looks slightly younger than her stated age.  VITAL SIGNS:  Blood pressure 144/80, pulse 72, respirations 12.  HEENT:  Normocephalic.  PERRLA.  EOMs intact.  CHEST:  Clear to auscultation.  No rhonchi or rales.  HEART:  Regular rate and rhythm.  Grade 3/6 murmur heard best at the apex,  holosystolic.  ABDOMEN:  Soft, nontender.  Liver and spleen not felt.  GENITALIA/RECTAL/PELVIC/BREASTS:  Not done and not pertinent for present  illness.  EXTREMITIES:  The patient has pain and crepitus range of motion of left  shoulder and limited range of motion secondary to pain.   ADMISSION DIAGNOSES:  1. Advanced glenohumeral arthritis of  the left shoulder.  2. Hypertension.  3. Recent discovery of murmur.  4. Gastroesophageal reflux disease.   PLAN:  The patient will be admitted for left shoulder hemiarthroplasty.  Again, she is to be seen by Dr. Cassell Clement for cardiac clearance this  Friday.  If all goes well, we will proceed with the surgery as mentioned  above.     Dooley L. Cherlynn June.                 Almedia Balls. Ranell Patrick, M.D.    DLU/MEDQ  D:  01/09/2003  T:  01/09/2003  Job:  [id number]   cc:   Ernestina Penna, M.D.  [address] [place]  [place] [address]  Fax: [phone]   Gaynelle Cage, MD  [phone] [address]  [place]  [place] [address]  Fax: [phone]

## 2011-02-13 NOTE — Op Note (Signed)
NAME:  Valerie [name], Valerie [name]                            ACCOUNT NO.:  [id number]   MEDICAL RECORD NO.:  [id number]                   PATIENT TYPE:  INP   LOCATION:  NA                                   [facility]   PHYSICIAN:  Almedia Balls. Ranell Patrick, M.D.              DATE OF BIRTH:  June 17, 1928   DATE OF PROCEDURE:  01/19/2003  DATE OF DISCHARGE:                                 OPERATIVE REPORT   PREOPERATIVE DIAGNOSIS:  Left shoulder osteoarthritis, severe.   POSTOPERATIVE DIAGNOSIS:  Left shoulder osteoarthritis, severe.   PROCEDURE:  Left shoulder hemiarthroplasty.   SURGEON:  Almedia Balls. Ranell Patrick, M.D.   FIRST ASSISTANT:  Clarene Reamer, P.A.-C.   ANESTHESIA:  General anesthesia plus interscalene block anesthesia was used.   ESTIMATED BLOOD LOSS:  Less than 50 mL.   FLUIDS REPLACED:  1200 mL crystalloid.   INSTRUMENT COUNT:  Correct.   COMPLICATIONS:  None.   ANTIBIOTICS:  Perioperative antibiotics were given.   INDICATIONS FOR PROCEDURE:  The patient is a 77 year old female who presents  complaining of severe left sided shoulder pain. She has had worsening  symptoms over the last several years and has failed conservative management  consisting of injections, narcotic pain medications and activity  modification. The patient has pain at rest and at night and essentially is  disabled by it. Radiographs demonstrate advanced glenohumeral arthritis as  well as an MRI scan demonstrating leading edge supraspinatus tear and  arthritic changes in the shoulder. After counseling the patient regarding  further options for management including surgical treatment, she has elected  to proceed with surgical arthroplasty of the shoulder.   DESCRIPTION OF PROCEDURE:  After an adequate level was achieved, the patient  was positioned in the modified beach chair position and all vascular  structures padded appropriately. The left shoulder was examined under  anesthesia, forward flexion was limited  to 85 degrees, abduction  approximately 60 degrees, external rotation 5 degrees, and internal rotation  about 30. After completion of the exam under anesthesia, the left shoulder  was prepped and draped in its entirety in the usual sterile fashion. A  deltopectoral incision was created sharply utilizing a 10 blade scalpel from  the coracoid process down to the anterior portion of the proximal arm near  the insertion of the deltoid muscle. Dissection was carried sharply down to  through the subcutaneous tissues using Bovie electrocautery. The cephalic  vein was identified and noted to be crossing from the deltoid over to the  pectoralis directly across the field rather than staying attached to either  the pectoralis or the deltoid, thus it was decided to find the cephalic.  This was done proximally and distally in the wound for good exposure. The  deltopectoral interval was widened and the top 50% pectoralis showed the  pectoralis was released sharply at the humerus with a finger underneath the  intact tendon.  At this point, the clavipectoral fascia was incised, the  conjoined tendon was mobilized and taken medially and self retaining  retractors placed under the conjoined and under the deltoid giving good  exposure of the scapularis and the three sisters which were divided and  ligated utilizing suture ligation. At this point, because of the patient's  loss of external rotation, it was decided to take the subscapularis off as  lateral as possible and this came directly off the lesser tuberosity and  tags were placed in the subscapularis and it was retracted medially. The  capsule was excised anteriorly and released down to the 6 o'clock position  to facilitated subluxation of the head. The arm was extended and externally  rotated giving good exposure of the proximal humerus. At this point, a  humeral resection guide was placed along the humerus and a curved Crego  elevator was placed around  the posterior portion of the humeral head at the  level of the rotator cuff insertion both as a guide for aiming of the saw  and as a protector of the rotator cuff. The saw was exiting directly at the  insertion of the supraspinatus. At this point, the elbow was placed at the  patient's side, the arm was placed at 30 degrees of external rotation and a  cut was made perpendicular to the floor with the oscillating saw.  Appropriate resection was achieved. A finger was used to check the rotator  cuff insertion which was preserved and was coming down right at the edge of  the cut verifying appropriate level of neck and head resection. The head was  sized on the back table and was a size 44 before it was templated. Large  osteophytes were identified and these were left until a little bit later. A  large loose body was removed from the inferior capsule. The axillary nerve  was identified and protected during all the capsular work and the release.  This was identified only with the tip of the surgeon's finger. At this  point, with the arm extended and externally rotated. A 6 mm reamer was  placed laterally in the cut edge of the humerus and used to find the humeral  canal. Progressive hand reaming was done up to a size 10 where we got some  bite plus a 10 mm implant was to be used. We were going to be going  noncemented. This was a Depuy global advantage system. The 10 mm box cutter  was then used to appropriately align the fins for the trial broach and  prosthesis and the box cut was engaged. Both the front and posterior fins  engaged at the same time indicating a flush cut and this married up nicely  with the flange on the box cutter. The box cutter was impacted gently, some  bone was taken out of the medial calcar where the box cutter was made to cut  with the rongeur and placed in the cup for later bone grafting. At this point, the broach was placed again with attention towards placing it not  in  the varus but in the appropriate straight up and down positioning keeping  the surgeon's hand out laterally and the calcar of the prosthesis came down  nice and flush on the cut edge of the proximal humerus. At this point,  osteophytes removed sharply utilizing a rongeur all the way around. A trial  44 centralized KED was then inserted and the shoulder was taken through a  full range of motion. Appropriate soft tissue balancing was noted. There was  noted to be a little lack of coverage superiorly and it was decided to trial  an eccentric  head, thus the 44 x 18 head was removed and a 44 x 18  eccentric head with 4 mm offset was introduced and the offset was placed  superiorly which brought the edge of the prosthesis flush with the edge of  the cut and adjacent to the rotator cuff. At this point, the shoulder was  taken through a full range of motion. The patient's arm could be touched to  the opposite shoulder, fully forward flexed to 140 degrees, abducted to 110  degrees and externally rotated with the subscapularis, brought out to the  lesser tuberosity to about 45 degrees. At this point, trial components were  removed, thorough irrigation was performed. The final inspection for loose  bodies and cartilage was noted. Of note, the patient's biceps had been  sacrificed at the superior labrum and was retracted out of the way. As the  real size 10 Depuy global advantage shoulder implant was inserted into  place, some impaction bone grafting was performed around the medial calcar.  The patient did have excellent cancellous bone; however, this was placed to  prevent any subsidence or settling on the medial side and the excellent fit  and fill was obtained with the prosthesis which was impacted down into  place. At this point, the 44 x 18 with the offset was inserted with again  the offset superiorly with perfect coverage of the patient's cut edge of the  humerus. The shoulder was taken  through a full range of motion. I could  sublux her 50% posteriorly, 50% inferiorly, again touch the opposite  shoulder and it forward flexed nicely. After thorough irrigation, the  subscap was repaired at the lesser tuberosity through drill holes with 1 mm  cotton and Dacron tapes in a modified Mason-Allen suture technique. Three of  these sutures were utilized with a total of six crossing strands. The  rotator interval was closed using #1 Ethibond suture. The biceps was painted  east in a neutral position with the elbow at 90 with sutures only through  adjacent soft tissue. At this point, the deltopectoral interval  was closed utilizing a 2-0 Vicryl followed by 2-0 Vicryl closure of the  subcu and skin with 4-0 running Monocryl. Steri-Strips were applied followed  by a sterile dressing. The patient tolerated the surgery well and was taken  to the recovery room in stable condition.                                              Almedia Balls. Ranell Patrick, M.D.    SRN/MEDQ  D:  01/19/2003  T:  01/19/2003  Job:  [id number]

## 2011-02-17 ENCOUNTER — Other Ambulatory Visit: Payer: Self-pay | Admitting: Gastroenterology

## 2011-03-17 ENCOUNTER — Other Ambulatory Visit: Payer: Self-pay | Admitting: Internal Medicine

## 2011-03-30 ENCOUNTER — Encounter: Payer: Self-pay | Admitting: Cardiology

## 2011-04-03 ENCOUNTER — Other Ambulatory Visit: Payer: Self-pay | Admitting: *Deleted

## 2011-04-03 DIAGNOSIS — E785 Hyperlipidemia, unspecified: Secondary | ICD-10-CM

## 2011-04-06 ENCOUNTER — Other Ambulatory Visit: Payer: Self-pay | Admitting: Internal Medicine

## 2011-04-08 ENCOUNTER — Other Ambulatory Visit: Payer: Self-pay | Admitting: Internal Medicine

## 2011-04-08 ENCOUNTER — Telehealth: Payer: Self-pay | Admitting: *Deleted

## 2011-04-08 ENCOUNTER — Ambulatory Visit: Payer: Medicare Other | Admitting: Cardiology

## 2011-04-08 ENCOUNTER — Other Ambulatory Visit: Payer: Medicare Other | Admitting: *Deleted

## 2011-04-08 NOTE — Telephone Encounter (Signed)
Mailed missed app letter. Alfonso Ramus RN

## 2011-04-09 ENCOUNTER — Telehealth: Payer: Self-pay | Admitting: Gastroenterology

## 2011-04-09 ENCOUNTER — Encounter: Payer: Self-pay | Admitting: Gastroenterology

## 2011-04-09 NOTE — Telephone Encounter (Signed)
Pt aware she needs an appt she can get otc prilosec until office visit

## 2011-04-21 ENCOUNTER — Other Ambulatory Visit: Payer: Self-pay | Admitting: *Deleted

## 2011-04-21 DIAGNOSIS — F419 Anxiety disorder, unspecified: Secondary | ICD-10-CM

## 2011-04-21 NOTE — Telephone Encounter (Signed)
Refilled meds per fax request.  

## 2011-04-26 MED ORDER — ALPRAZOLAM 0.25 MG PO TABS: 0.2500 mg | ORAL_TABLET | Freq: Every evening | ORAL | Status: DC | PRN

## 2011-05-13 ENCOUNTER — Encounter: Payer: Self-pay | Admitting: *Deleted

## 2011-05-15 ENCOUNTER — Encounter: Payer: Self-pay | Admitting: Gastroenterology

## 2011-05-15 ENCOUNTER — Encounter: Payer: Self-pay | Admitting: *Deleted

## 2011-05-15 ENCOUNTER — Telehealth: Payer: Self-pay | Admitting: *Deleted

## 2011-05-15 ENCOUNTER — Other Ambulatory Visit (INDEPENDENT_AMBULATORY_CARE_PROVIDER_SITE_OTHER): Payer: Medicare Other

## 2011-05-15 ENCOUNTER — Ambulatory Visit (INDEPENDENT_AMBULATORY_CARE_PROVIDER_SITE_OTHER): Payer: Medicare Other | Admitting: Gastroenterology

## 2011-05-15 DIAGNOSIS — Z800 Family history of malignant neoplasm of digestive organs: Secondary | ICD-10-CM

## 2011-05-15 DIAGNOSIS — R6889 Other general symptoms and signs: Secondary | ICD-10-CM

## 2011-05-15 DIAGNOSIS — K219 Gastro-esophageal reflux disease without esophagitis: Secondary | ICD-10-CM

## 2011-05-15 DIAGNOSIS — K625 Hemorrhage of anus and rectum: Secondary | ICD-10-CM | POA: Insufficient documentation

## 2011-05-15 DIAGNOSIS — R1310 Dysphagia, unspecified: Secondary | ICD-10-CM | POA: Insufficient documentation

## 2011-05-15 DIAGNOSIS — D509 Iron deficiency anemia, unspecified: Secondary | ICD-10-CM | POA: Insufficient documentation

## 2011-05-15 LAB — HEPATIC FUNCTION PANEL
ALT: 21 U/L (ref 0–35)
AST: 30 U/L (ref 0–37)
Albumin: 4.5 g/dL (ref 3.5–5.2)
Total Bilirubin: 0.8 mg/dL (ref 0.3–1.2)
Total Protein: 7.8 g/dL (ref 6.0–8.3)

## 2011-05-15 LAB — CBC WITH DIFFERENTIAL/PLATELET
Eosinophils Relative: 4 % (ref 0.0–5.0)
Lymphocytes Relative: 28.9 % (ref 12.0–46.0)
MCV: 95.2 fl (ref 78.0–100.0)
Monocytes Absolute: 0.5 10*3/uL (ref 0.1–1.0)
Neutrophils Relative %: 59 % (ref 43.0–77.0)
Platelets: 337 10*3/uL (ref 150.0–400.0)
WBC: 6.4 10*3/uL (ref 4.5–10.5)

## 2011-05-15 LAB — IBC PANEL: Transferrin: 412.7 mg/dL — ABNORMAL HIGH (ref 212.0–360.0)

## 2011-05-15 LAB — FERRITIN: Ferritin: 83.7 ng/mL (ref 10.0–291.0)

## 2011-05-15 LAB — BASIC METABOLIC PANEL
BUN: 26 mg/dL — ABNORMAL HIGH (ref 6–23)
CO2: 28 mEq/L (ref 19–32)
Chloride: 107 mEq/L (ref 96–112)
Creatinine, Ser: 0.9 mg/dL (ref 0.4–1.2)
Glucose, Bld: 91 mg/dL (ref 70–99)

## 2011-05-15 LAB — FOLATE: Folate: 17.4 ng/mL (ref >5.9)

## 2011-05-15 MED ORDER — PEG-KCL-NACL-NASULF-NA ASC-C 100 G PO SOLR: 1.0000 | Freq: Once | ORAL | Status: DC

## 2011-05-15 MED ORDER — ESOMEPRAZOLE MAGNESIUM 40 MG PO CPDR: 40.0000 mg | DELAYED_RELEASE_CAPSULE | Freq: Every day | ORAL | Status: DC

## 2011-05-15 NOTE — Telephone Encounter (Signed)
Message copied by Florene Glen on Fri May 15, 2011  1:35 PM ------      Message from: PATTERSON, DAVID R      Created: Fri May 15, 2011 12:22 PM       b12 shots and spray.Marland KitchenMarland Kitchen

## 2011-05-15 NOTE — Progress Notes (Signed)
This is a   Current Medications, Allergies, Past Medical History, Past Surgical History, Family History and Social History were reviewed in [employer] record.  Pertinent Review of Systems Negative   Physical Exam:    Assessment and Plan: Encounter Diagnoses  Name Primary?  Marland Kitchen Dysphagia   . Family history of malignant neoplasm of gastrointestinal tract   . Iron deficiency anemia, unspecified    . Other general symptoms

## 2011-05-15 NOTE — Telephone Encounter (Signed)
Spoke with pt to inform her Dr Jarold Motto would like for her to begin Vitamin B12 injections weekly x3 then monthly for a one year total. If she chooses, she may use Nascobal instead of the monthly injections. Pt requests the injections be given at Dr Dorinda Hill [facility] office d/t she lives 50 miles from here. Pt is hesitant about getting a COLON. Explained to her Dr Jarold Motto is doing the COLON d/t the rectal bleeding episode and her family hx. Added a note on EGD for 05/18/11 that pt would like to speak to Dr Jarold Motto about the procedure. Pt stated understanding.

## 2011-05-15 NOTE — Progress Notes (Signed)
History of Present Illness:  This is a very pleasant 77 year old Caucasian female with many years of acid reflux and recurrent peptic strictures of her esophagus. She improved clinically and was off of PPI therapy for several years. She now reports several months of burning substernal chest pain with rather frequent solid food dysphagia. He said no weight loss, nausea vomiting, and is status post cholecystectomy. She does have regular bowel movements but periodic bright red blood per rectum. Her father had colon cancer at age 45. Her last colonoscopy was in 2009 and there was a very difficult exam because of marked tortuosity and redundancy. She is on 81 mg of aspirin a day but denies other NSAID use, alcohol or cigarette use. She generally has regular bowel movements without abdominal pain.  I have reviewed this patient's present history, medical and surgical past history, allergies and medications.     ROS: The remainder of the 10 point ROS is negative.Marland Kitchen assessment leg no cardiovascular or pulmonary complaints. She is followed by Dr. Cassell Clement in cardiology for her hypertension.   Past Medical History  Diagnosis Date  . Hypertension   . Hyperlipidemia   . Carotid bruit     BILATERAL  . Palpitations   . Heart murmur   . Hiatal hernia   . Stricture and stenosis of esophagus   . Esophageal reflux   . Duodenitis without mention of hemorrhage   . Diverticulosis of colon with hemorrhage   . IBS (irritable bowel syndrome)   . Family history of malignant neoplasm of gastrointestinal tract   . Arthritis    Past Surgical History  Procedure Date  . US echocardiography 06/21/2008    EF 55-60%  . US echocardiography 01/12/2003    EF 55-60%  . Cholecystectomy   . Dilation and curettage of uterus     reports that she has never smoked. She has never used smokeless tobacco. She reports that she does not drink alcohol or use illicit drugs. family history includes Cancer in her brother; Colon  cancer (age of onset:94) in her father; Kidney cancer in her brother; Leukemia in her mother; and Stroke in her sister. Allergies not on file     Physical Exam: General well developed well nourished patient in no acute distress, appearing younger than her stated age Eyes PERRLA, no icterus, fundoscopic exam per opthamologist Skin no lesions noted Neck supple, no adenopathy, no thyroid enlargement, no tenderness Chest clear to percussion and auscultation Heart no significant murmurs, gallops or rubs noted Abdomen no hepatosplenomegaly masses or tenderness, BS normal.  Rectal inspection normal no fissures, or fistulae noted.  No masses or tenderness on digital exam. Stool guaiac negative. Extremities no acute joint lesions, edema, phlebitis or evidence of cellulitis. Neurologic patient oriented x 3, cranial nerves intact, no focal neurologic deficits noted. Psychological mental status normal and normal affect.  Assessment and plan: Chronic GERD with recurrent peptic stricture  I scheduled an endoscopy with probable esophageal dilatation, and placed her on daily Dexilant 60 mg 30 minutes before breakfast. Standard reflex regime reviewed with patient. Because of her family history and rectal bleeding, colonoscopy has been scheduled separately with propofol sedation. Screening laboratory parameters ordered for review. I have asked her to continue her cardiac medications as reviewed.   Please copy her primary care physician, referring physician, and pertinent subspecialists. Encounter Diagnoses  Name Primary?  Marland Kitchen Dysphagia   . Family history of malignant neoplasm of gastrointestinal tract   . Iron deficiency anemia, unspecified    .  Other general symptoms     fffffffffffffffffff

## 2011-05-15 NOTE — Telephone Encounter (Signed)
Opened in error

## 2011-05-15 NOTE — Patient Instructions (Signed)
Your procedure has been scheduled for 05/18/2011 (Endoscopy) & 06/03/2011 (Colonoscopy), please follow the seperate instructions.  Please go to the basement today for your labs.  Your prescription(s) have been sent to you pharmacy.  Stop Prilosec and start Nexium, we have given samples and sent a rx to your pharmacy.

## 2011-05-18 ENCOUNTER — Encounter: Payer: Self-pay | Admitting: Gastroenterology

## 2011-05-18 ENCOUNTER — Other Ambulatory Visit: Payer: Self-pay | Admitting: Gastroenterology

## 2011-05-18 ENCOUNTER — Ambulatory Visit (AMBULATORY_SURGERY_CENTER): Payer: Medicare Other | Admitting: Gastroenterology

## 2011-05-18 VITALS — BP 190/75 | HR 65 | Temp 97.9°F | Resp 16 | Ht 64.0 in | Wt 125.0 lb

## 2011-05-18 DIAGNOSIS — D509 Iron deficiency anemia, unspecified: Secondary | ICD-10-CM

## 2011-05-18 DIAGNOSIS — K222 Esophageal obstruction: Secondary | ICD-10-CM | POA: Insufficient documentation

## 2011-05-18 DIAGNOSIS — R1310 Dysphagia, unspecified: Secondary | ICD-10-CM | POA: Insufficient documentation

## 2011-05-18 DIAGNOSIS — K315 Obstruction of duodenum: Secondary | ICD-10-CM | POA: Insufficient documentation

## 2011-05-18 DIAGNOSIS — K219 Gastro-esophageal reflux disease without esophagitis: Secondary | ICD-10-CM

## 2011-05-18 MED ORDER — SODIUM CHLORIDE 0.9 % IV SOLN: 500.0000 mL | INTRAVENOUS | Status: DC

## 2011-05-18 NOTE — Patient Instructions (Signed)
   Follow post dilatation diet.  Follow discharge instructions.  Continue your medications.

## 2011-05-18 NOTE — Progress Notes (Signed)
Pt needs to have her dentures removed. MAW

## 2011-05-19 ENCOUNTER — Telehealth: Payer: Self-pay | Admitting: *Deleted

## 2011-05-19 NOTE — Telephone Encounter (Signed)

## 2011-05-22 ENCOUNTER — Ambulatory Visit (INDEPENDENT_AMBULATORY_CARE_PROVIDER_SITE_OTHER): Payer: Medicare Other | Admitting: Cardiology

## 2011-05-22 ENCOUNTER — Encounter: Payer: Self-pay | Admitting: Cardiology

## 2011-05-22 VITALS — BP 152/70 | HR 76 | Wt 123.0 lb

## 2011-05-22 DIAGNOSIS — I6529 Occlusion and stenosis of unspecified carotid artery: Secondary | ICD-10-CM | POA: Insufficient documentation

## 2011-05-22 DIAGNOSIS — E7800 Pure hypercholesterolemia, unspecified: Secondary | ICD-10-CM | POA: Insufficient documentation

## 2011-05-22 DIAGNOSIS — I119 Hypertensive heart disease without heart failure: Secondary | ICD-10-CM | POA: Insufficient documentation

## 2011-05-22 NOTE — Assessment & Plan Note (Signed)
The patient has not had any TIA symptoms.  She is on a baby aspirin daily.  She is followed closely by [facility] with followup Dopplers.

## 2011-05-22 NOTE — Assessment & Plan Note (Signed)
The patient has a past history of hypercholesterolemia and is on Pravachol.  She's not having any adverse side effects from the Pravachol.  Her last LDL cholesterol in March 2012 was 109.  Her glycerides were normal.

## 2011-05-22 NOTE — Progress Notes (Signed)
Valerie [name] Date of Birth:  29-Jun-1928 [facility] [address]   [address] [place], [place]  [address] [phone]           Fax   [phone]  History of Present Illness: This pleasant 77 year old woman is seen for a scheduled followup office visit.  She has a history of essential hypertension.  She also has a history of dyslipidemia.  She has known bilateral carotid bruits which are followed by [facility].  She has not been having any TIA symptoms.  She denies any chest pain or angina.  She does not have any history of ischemic heart disease and she had a normal adenosine Cardiolite stress test on 11/28/08 with an ejection fraction of 72%.  GEN an ultrasound of her heart on 06/21/08 showing normal left ventricular systolic function and mild aortic sclerosis with mild aortic insufficiency and she did have diastolic impaired relaxation.  She has not any symptoms of congestive heart failure.  He has had some recent problems with dysphagia and earlier this week she underwent a esophageal dilatation procedure by Dr. Sheryn Bison.  Current Outpatient Prescriptions  Medication Sig Dispense Refill  . ALPRAZolam (XANAX) 0.25 MG tablet as needed.      Marland Kitchen aspirin 81 MG tablet Take 81 mg by mouth daily.        Marland Kitchen diltiazem (CARTIA XT) 240 MG 24 hr capsule Take 1 capsule (240 mg total) by mouth daily.  30 capsule  11  . esomeprazole (NEXIUM) 40 MG capsule Take 1 capsule (40 mg total) by mouth daily before breakfast.  30 capsule  3  . fenofibrate 160 MG tablet Take 160 mg by mouth daily.        . hydrochlorothiazide 25 MG tablet Take 12.5 mg by mouth daily.       Marland Kitchen latanoprost (XALATAN) 0.005 % ophthalmic solution Place 1 drop into both eyes Twice daily.      Marland Kitchen lisinopril (PRINIVIL,ZESTRIL) 20 MG tablet Take 1 tablet by mouth 1 day or 1 dose.      . metoprolol (LOPRESSOR) 50 MG tablet Take 50 mg by mouth 3 (three) times daily.        . Multiple Vitamin (MULTIVITAMIN) tablet Take 1  tablet by mouth daily.        . pravastatin (PRAVACHOL) 20 MG tablet Take 20 mg by mouth daily.          Allergies  Allergen Reactions  . Sulfa Antibiotics Nausea Only    Patient Active Problem List  Diagnoses  . GERD  . DIVERTICULOSIS-COLON  . HEMOCCULT POSITIVE STOOL  . Dysphagia  . Family history of malignant neoplasm of gastrointestinal tract  . Iron deficiency anemia, unspecified   . Other general symptoms   . GERD (gastroesophageal reflux disease)  . BRBPR (bright red blood per rectum)  . Dysphagia, unspecified  . Stricture esophagus  . Stricture of duodenum    History  Smoking status  . Never Smoker   Smokeless tobacco  . Never Used    History  Alcohol Use No    Family History  Problem Relation Age of Onset  . Leukemia Mother   . Colon cancer Father 61  . Kidney cancer Brother   . Cancer Brother     bladder  . Stroke Sister   . Esophageal cancer Neg Hx   . Stomach cancer Neg Hx     Review of Systems: Constitutional: no fever chills diaphoresis or fatigue or change  in weight.  Head and neck: no hearing loss, no epistaxis, no photophobia or visual disturbance. Respiratory: No cough, shortness of breath or wheezing. Cardiovascular: No chest pain peripheral edema, palpitations. Gastrointestinal: No abdominal distention, no abdominal pain, no change in bowel habits hematochezia or melena. Genitourinary: No dysuria, no frequency, no urgency, no nocturia. Musculoskeletal:No arthralgias, no back pain, no gait disturbance or myalgias. Neurological: No dizziness, no headaches, no numbness, no seizures, no syncope, no weakness, no tremors. Hematologic: No lymphadenopathy, no easy bruising. Psychiatric: No confusion, no hallucinations, no sleep disturbance.    Physical Exam: Filed Vitals:   05/22/11 1117  BP: 152/70  Pulse: 76  The general appearance reveals a well-developed well-nourished woman in no distress.Pupils equal and reactive.   Extraocular  Movements are full.  There is no scleral icterus.  The mouth and pharynx are normal.  The neck is supple.  The carotids reveal Bilateral soft bruits.  The jugular venous pressure is normal.  The thyroid is not enlarged.  There is no lymphadenopathy.The chest is clear to percussion and auscultation. There are no rales or rhonchi. Expansion of the chest is symmetrical.  The precordium is quiet.  The first heart sound is normal.  The second heart sound is physiologically split.  There is no murmur gallop rub or click.  There is no abnormal lift or heave.  The abdomen is soft and nontender. Bowel sounds are normal. The liver and spleen are not enlarged. There Are no abdominal masses. There are no bruits.  The pedal pulses are good.  There is no phlebitis or edema.  There is no cyanosis or clubbing.  Strength is normal and symmetrical in all extremities.  There is no lateralizing weakness.  There are no sensory deficits.  The skin is warm and dry.  There is no rash.       Assessment / Plan: Continue same medication.  Recheck in 6 months for office visit and followup fasting lab work

## 2011-05-22 NOTE — Assessment & Plan Note (Signed)
The patient has a past history of essential hypertension predominantly systolic.  She has not been expressing any headaches or dizzy spells.  No symptoms of congestive heart failure.  No syncope.

## 2011-06-03 ENCOUNTER — Encounter: Payer: Medicare Other | Admitting: Gastroenterology

## 2011-06-18 LAB — CBC
HCT: 31.3 — ABNORMAL LOW
HCT: 32.9 — ABNORMAL LOW
Hemoglobin: 13.7
Hemoglobin: 11.3 — ABNORMAL LOW
MCHC: 34.2
MCV: 97.8
MCV: 99.3
Platelets: 330
RBC: 4.12
RBC: 3.36 — ABNORMAL LOW
RDW: 12.8
WBC: 9

## 2011-06-18 LAB — DIFFERENTIAL
Basophils Absolute: 0
Lymphocytes Relative: 28
Monocytes Absolute: 0.7
Monocytes Relative: 8
Neutro Abs: 5.4

## 2011-06-18 LAB — BASIC METABOLIC PANEL
BUN: 23
CO2: 29
Calcium: 9.9
Chloride: 105
Chloride: 105
Creatinine, Ser: 0.8
GFR calc Af Amer: >60
GFR calc Af Amer: >60
GFR calc non Af Amer: >60
GFR calc non Af Amer: >60
Potassium: 4
Potassium: 4.3
Sodium: 141
Sodium: 138

## 2011-06-18 LAB — ANAEROBIC CULTURE

## 2011-06-18 LAB — WOUND CULTURE: Culture: NO GROWTH

## 2011-06-18 LAB — URINALYSIS, ROUTINE W REFLEX MICROSCOPIC
Ketones, ur: NEGATIVE
Nitrite: NEGATIVE
Urobilinogen, UA: 1
pH: 6.5

## 2011-06-18 LAB — APTT: aPTT: 22 — ABNORMAL LOW

## 2011-06-18 LAB — GRAM STAIN

## 2011-06-18 LAB — TYPE AND SCREEN

## 2011-09-07 ENCOUNTER — Other Ambulatory Visit: Payer: Self-pay | Admitting: Cardiology

## 2011-09-14 ENCOUNTER — Telehealth: Payer: Self-pay | Admitting: Gastroenterology

## 2011-09-14 NOTE — Telephone Encounter (Signed)
Notified Valerie [name] Dr Jarold Motto would like to see her tomorrow- she will come in at 1:30pm.

## 2011-09-14 NOTE — Telephone Encounter (Signed)
SEE ME TOMORROW

## 2011-09-14 NOTE — Telephone Encounter (Signed)
Pt with hx of GERD, Peptic and Duodenal Strictures requiring dilation, HH, Duodenitis, IBS, Diverticulosis, Family Hx of GI Neoplasm. Last COLON 2009 that was difficult d/t being tortuous and redundant. Last OV 05/15/11 started on Dexilant and EGD scheduled for 05/18/11 with Dilation. Today, pt reports pain in her stomach for 3-4 weeks. The pain is at her navel and moves from side to side with a sharp pain. Yesterday and today she has had very loose stools; she reports she usually has daily BM's. Pt is on Nexium; denies a temp. When asked if she's ever had this pain before, she replied years ago and Dr Jarold Motto told her it was Diverticulitis.  Pt given an appt for 09/18/11 at 09:30am. Any orders; antibiotic or just keep appt? Thanks.

## 2011-09-15 ENCOUNTER — Encounter: Payer: Self-pay | Admitting: Gastroenterology

## 2011-09-15 ENCOUNTER — Ambulatory Visit (INDEPENDENT_AMBULATORY_CARE_PROVIDER_SITE_OTHER): Payer: Medicare Other | Admitting: Gastroenterology

## 2011-09-15 DIAGNOSIS — K219 Gastro-esophageal reflux disease without esophagitis: Secondary | ICD-10-CM

## 2011-09-15 DIAGNOSIS — Z800 Family history of malignant neoplasm of digestive organs: Secondary | ICD-10-CM

## 2011-09-15 DIAGNOSIS — K315 Obstruction of duodenum: Secondary | ICD-10-CM

## 2011-09-15 DIAGNOSIS — R109 Unspecified abdominal pain: Secondary | ICD-10-CM | POA: Insufficient documentation

## 2011-09-15 DIAGNOSIS — R634 Abnormal weight loss: Secondary | ICD-10-CM

## 2011-09-15 DIAGNOSIS — K5730 Diverticulosis of large intestine without perforation or abscess without bleeding: Secondary | ICD-10-CM

## 2011-09-15 DIAGNOSIS — E538 Deficiency of other specified B group vitamins: Secondary | ICD-10-CM | POA: Insufficient documentation

## 2011-09-15 MED ORDER — PEG-KCL-NACL-NASULF-NA ASC-C 100 G PO SOLR: 1.0000 | Freq: Once | ORAL | Status: DC

## 2011-09-15 MED ORDER — CYANOCOBALAMIN 1000 MCG/ML IJ SOLN
1000.0000 ug | INTRAMUSCULAR | Status: AC
  Administered 2011-09-15: 1000 ug via INTRAMUSCULAR

## 2011-09-15 MED ORDER — HYOSCYAMINE SULFATE 0.125 MG SL SUBL: 0.1250 mg | SUBLINGUAL_TABLET | SUBLINGUAL | Status: AC | PRN

## 2011-09-15 NOTE — Patient Instructions (Signed)
You were given the first of three weekly b12 injections please make you next weeks b12 injection appt when you pass the front desk. You will needs three weekly injections then you can get the injections monthly at our office or Dr Buel Ream. Your procedure has been scheduled for 10/05/2011, please follow the seperate instructions.  Your prescription(s) have been sent to you pharmacy.  Handout on propofol given.

## 2011-09-15 NOTE — Progress Notes (Signed)
This is a long-term patient of mine with chronic diverticulosis, IBS constipation predominant, chronic GERD with recurrent peptic strictures, a history of peptic ulcer disease and previous duodenal strictures, who now presents with intermittent lower bowel pain described as a sharp brief pain on both lower quadrants without real precipitating or alleviating elements. The patient admits to chronic constipation with associated gas and bloating. She is on daily Nexium 40 mg and antihypertensive medications. She denies melena, rectal bleeding, upper GI or hepatobiliary complaints at this time. With her illness has been anorexia and a 5 pound weight loss. Her last colonoscopy exam was several years ago was a very difficult exam because of marked tortuosity and adhesions. Recent lab data has shown evidence of B12 deficiency, and she has not started B12 shots to date. Her medical care provided by [facility]  Current Medications, Allergies, Past Medical History, Past Surgical History, Family History and Social History were reviewed in [employer] record.  Pertinent Review of Systems Negative   Physical Exam: Awake and alert no acute distress appearing younger than her stated age. No appreciated stigmata of chronic liver disease. Chest is clear she appears to be in a regular rhythm without murmurs gallops or rubs. There is no abnormal distention, organomegaly, masses or tenderness. Bowel sounds are normal. Inspection the rectum is normal as is rectal exam without evidence of fissures or fistulae, masses or tenderness. There is also tumor present which is guaiac negative. Mental status is normal and peripheral extremities are unremarkable.    Assessment and Plan: Constipation predominant IBS with associated chronic abdominal pain, gas and bloating. He is due for followup colonoscopy exam with propofol sedation. She is to continue when necessary MiraLax with when  necessary sublingual Levsin as tolerated. I have asked her to begin B12 shots. She may have an elbow bacterial overgrowth syndrome with associated B12 deficiency. Blood pressure today is 172/66 and pulse was 80 and regular. Encounter Diagnoses  Name Primary?  . Abdominal pain   . Vitamin B12 deficiency   . Family history of malignant neoplasm of gastrointestinal tract   . GERD (gastroesophageal reflux disease)   . Stricture of duodenum   . Diverticulosis of colon (without mention of hemorrhage)   . Abnormal weight loss

## 2011-09-18 ENCOUNTER — Ambulatory Visit: Payer: Medicare Other | Admitting: Gastroenterology

## 2011-09-23 ENCOUNTER — Ambulatory Visit (INDEPENDENT_AMBULATORY_CARE_PROVIDER_SITE_OTHER): Payer: Medicare Other | Admitting: *Deleted

## 2011-09-23 DIAGNOSIS — I6529 Occlusion and stenosis of unspecified carotid artery: Secondary | ICD-10-CM

## 2011-10-05 ENCOUNTER — Encounter: Payer: Self-pay | Admitting: Gastroenterology

## 2011-10-05 ENCOUNTER — Other Ambulatory Visit: Payer: Self-pay | Admitting: *Deleted

## 2011-10-05 ENCOUNTER — Ambulatory Visit (AMBULATORY_SURGERY_CENTER): Payer: Medicare Other | Admitting: Gastroenterology

## 2011-10-05 DIAGNOSIS — K5730 Diverticulosis of large intestine without perforation or abscess without bleeding: Secondary | ICD-10-CM

## 2011-10-05 DIAGNOSIS — I6529 Occlusion and stenosis of unspecified carotid artery: Secondary | ICD-10-CM

## 2011-10-05 DIAGNOSIS — K644 Residual hemorrhoidal skin tags: Secondary | ICD-10-CM

## 2011-10-05 DIAGNOSIS — K648 Other hemorrhoids: Secondary | ICD-10-CM | POA: Insufficient documentation

## 2011-10-05 DIAGNOSIS — R634 Abnormal weight loss: Secondary | ICD-10-CM

## 2011-10-05 DIAGNOSIS — K625 Hemorrhage of anus and rectum: Secondary | ICD-10-CM

## 2011-10-05 DIAGNOSIS — R109 Unspecified abdominal pain: Secondary | ICD-10-CM

## 2011-10-05 DIAGNOSIS — K921 Melena: Secondary | ICD-10-CM

## 2011-10-05 MED ORDER — SODIUM CHLORIDE 0.9 % IV SOLN: 500.0000 mL | INTRAVENOUS | Status: DC

## 2011-10-05 NOTE — Progress Notes (Signed)
Patient did not experience any of the following events: a burn prior to discharge; a fall within the facility; wrong site/side/patient/procedure/implant event; or a hospital transfer or hospital admission upon discharge from the facility. (G8907) Patient did not have preoperative order for IV antibiotic SSI prophylaxis. (G8918)  

## 2011-10-05 NOTE — Progress Notes (Signed)
Patient walked around,burping and expelling some air. States pain level 0-3,wants to go home.

## 2011-10-05 NOTE — Patient Instructions (Signed)
Please follow discharge instructions given today. Resume current medications today. Diverticulosis and hemorrhoids seen, try to follow high fiber diet with liberal fluid intake. See handouts. Also use over the counter metamucil or benefiber as directed. Call us with any questions or concerns. Thank you!!

## 2011-10-05 NOTE — Op Note (Signed)
 [facility] [address] [place], [place]  [address]  COLONOSCOPY PROCEDURE REPORT  PATIENT:  Valerie [name], Valerie [name]  MR#:  [id number] BIRTHDATE:  Nov 14, 1927, 83 yrs. old  GENDER:  female ENDOSCOPIST:  Vania Rea. Jarold Motto, MD, [facility] REF. BY: PROCEDURE DATE:  10/05/2011 PROCEDURE:  Surveillance Colonoscopy ASA CLASS:  Class III INDICATIONS:  Abdominal pain gas and bloating MEDICATIONS:   propofol (Diprivan) 250 mg IV  DESCRIPTION OF PROCEDURE:   After the risks and benefits and of the procedure were explained, informed consent was obtained. Digital rectal exam was performed and revealed no abnormalities. The [id number] endoscope was introduced through the anus and advanced to the cecum, which was identified by both the appendix and ileocecal valve.  The quality of the prep was excellent, using MoviPrep.  The instrument was then slowly withdrawn as the colon was fully examined. <<PROCEDUREIMAGES>>  FINDINGS:  There were mild diverticular changes in left colon. diverticulosis was found.  No polyps or cancers were seen. Internal and external Hemorrhoids were found. see pictures. Retroflexed views in the rectum revealed hemorrhoids.    The scope was then withdrawn from the patient and the procedure completed.  COMPLICATIONS:  None ENDOSCOPIC IMPRESSION: 1) Diverticulosis,mild,left sided diverticulosis 2) No polyps or cancers 3) Internal and external hemorrhoids 4) Hemorrhoids RECOMMENDATIONS: 1) High fiber diet with liberal fluid intake. 2) metamucil or benefiber 3) Titrate to need  REPEAT EXAM:  No  ______________________________ Vania Rea. Jarold Motto, MD, [facility]  CC:  Rudi Heap, MD  n. Rosalie Doctor:   Vania Rea. Lamaj Metoyer at 10/05/2011 03:50 PM  Gertie Fey, [id number]

## 2011-10-06 ENCOUNTER — Telehealth: Payer: Self-pay

## 2011-10-06 NOTE — Telephone Encounter (Signed)

## 2011-10-12 ENCOUNTER — Encounter: Payer: Self-pay | Admitting: Vascular Surgery

## 2011-10-12 NOTE — Procedures (Unsigned)
CAROTID DUPLEX EXAM  INDICATION:  Follow up carotid disease.  HISTORY: Diabetes:  No Cardiac:  No Hypertension:  Yes Smoking:  No Previous Surgery:  No Amaurosis Fugax No, Paresthesias No, Hemiparesis No                                      RIGHT             LEFT Brachial systolic pressure:         182               182 Brachial Doppler waveforms:         WNL               WNL Vertebral direction of flow:        antegrade         antegrade DUPLEX VELOCITIES (cm/sec) CCA peak systolic                   72                93 ECA peak systolic                   67                85 ICA peak systolic                   46/112            98/178 ICA end diastolic                   10/21             9/21 PLAQUE MORPHOLOGY:                  heterogeneous     heterogeneous PLAQUE AMOUNT:                      mild              mild PLAQUE LOCATION:                    ICA               ICA  IMPRESSION: 1. 1% to 39% bilateral internal carotid artery plaquing with     tortuosity and disease observed in the mid segment. 2. Bilateral vertebral arteries are within normal limits. 3. Stable results compared to previous exam 1 year ago.  ___________________________________________ [name]. Edilia Bo, M.D.  LT/MEDQ  D:  09/23/2011  T:  09/24/2011  Job:  [id number]

## 2011-11-04 ENCOUNTER — Telehealth: Payer: Self-pay | Admitting: Gastroenterology

## 2011-11-04 NOTE — Telephone Encounter (Signed)
Pt would like her B12 injections at Dr Kathi Der of[facility]. Will check with the CMA and call tomorrow.

## 2011-11-05 MED ORDER — CYANOCOBALAMIN 1000 MCG/ML IJ SOLN: INTRAMUSCULAR | Status: DC

## 2011-11-05 NOTE — Telephone Encounter (Signed)
Faxed order to Dr Kath[facility] for B12 injections: im weekly x 2, then monthly for one year. Fax [phone]

## 2011-11-11 ENCOUNTER — Other Ambulatory Visit: Payer: Self-pay | Admitting: *Deleted

## 2011-11-11 MED ORDER — HYDROCHLOROTHIAZIDE 25 MG PO TABS: 12.5000 mg | ORAL_TABLET | Freq: Every day | ORAL | Status: DC

## 2011-11-11 NOTE — Telephone Encounter (Signed)
Refilled hctz 

## 2011-11-27 DEATH — deceased

## 2011-12-16 ENCOUNTER — Other Ambulatory Visit (INDEPENDENT_AMBULATORY_CARE_PROVIDER_SITE_OTHER): Payer: Medicare Other

## 2011-12-16 DIAGNOSIS — I119 Hypertensive heart disease without heart failure: Secondary | ICD-10-CM

## 2011-12-16 DIAGNOSIS — E7800 Pure hypercholesterolemia, unspecified: Secondary | ICD-10-CM

## 2011-12-16 LAB — LIPID PANEL
LDL Cholesterol: 98 mg/dL (ref 0–99)
VLDL: 27 mg/dL (ref 0.0–40.0)

## 2011-12-16 LAB — BASIC METABOLIC PANEL
BUN: 28 mg/dL — ABNORMAL HIGH (ref 6–23)
Chloride: 108 mEq/L (ref 96–112)
Creatinine, Ser: 0.9 mg/dL (ref 0.4–1.2)
Glucose, Bld: 80 mg/dL (ref 70–99)
Potassium: 4.2 mEq/L (ref 3.5–5.1)

## 2011-12-16 LAB — HEPATIC FUNCTION PANEL
ALT: 25 U/L (ref 0–35)
Alkaline Phosphatase: 134 U/L — ABNORMAL HIGH (ref 39–117)
Bilirubin, Direct: 0 mg/dL (ref 0.0–0.3)
Total Bilirubin: 0.4 mg/dL (ref 0.3–1.2)

## 2011-12-16 NOTE — Progress Notes (Signed)
Quick Note:  Please make copy of labs for patient visit. ______ 

## 2011-12-17 ENCOUNTER — Encounter: Payer: Self-pay | Admitting: Cardiology

## 2011-12-17 ENCOUNTER — Ambulatory Visit (INDEPENDENT_AMBULATORY_CARE_PROVIDER_SITE_OTHER): Payer: Medicare Other | Admitting: Cardiology

## 2011-12-17 ENCOUNTER — Other Ambulatory Visit: Payer: Medicare Other

## 2011-12-17 VITALS — BP 120/70 | Ht 65.0 in | Wt 121.0 lb

## 2011-12-17 DIAGNOSIS — I6529 Occlusion and stenosis of unspecified carotid artery: Secondary | ICD-10-CM

## 2011-12-17 DIAGNOSIS — I119 Hypertensive heart disease without heart failure: Secondary | ICD-10-CM

## 2011-12-17 DIAGNOSIS — R002 Palpitations: Secondary | ICD-10-CM

## 2011-12-17 DIAGNOSIS — E7800 Pure hypercholesterolemia, unspecified: Secondary | ICD-10-CM

## 2011-12-17 NOTE — Progress Notes (Signed)
Valerie [name] Date of Birth:  31-Aug-1928 [facility] [address] [place], [place]  [address] [phone]         Fax   [phone]  History of Present Illness: This pleasant 78 year old woman is seen for a follow up 6 months for office visit.  He has a past history of hypertension and a past history of asymptomatic bilateral carotid artery stenosis.  She's also had hypercholesterolemia.  She does not have any history of ischemic heart disease and she had a normal adenosine Cardiolite stress test in March 2010.  Her ejection fraction was 72%.  In 2009 she had an ultrasound of her heart showing mild aortic sclerosis with mild aortic insufficiency and diastolic dysfunction.  Current Outpatient Prescriptions  Medication Sig Dispense Refill  . ALPRAZolam (XANAX) 0.25 MG tablet as needed.      Marland Kitchen aspirin 81 MG tablet Take 81 mg by mouth daily.        . cyanocobalamin (,VITAMIN B-12,) 1000 MCG/ML injection Please give injection into muscle weekly for 2 weeks and then begin monthly injections for one year.  1 mL  0  . diltiazem (CARTIA XT) 240 MG 24 hr capsule Take 1 capsule (240 mg total) by mouth daily.  30 capsule  11  . esomeprazole (NEXIUM) 40 MG capsule Take 1 capsule (40 mg total) by mouth daily before breakfast.  30 capsule  3  . hydrochlorothiazide (HYDRODIURIL) 25 MG tablet Take 0.5 tablets (12.5 mg total) by mouth daily.  30 tablet  11  . latanoprost (XALATAN) 0.005 % ophthalmic solution Place 1 drop into both eyes Twice daily.      Marland Kitchen lisinopril (PRINIVIL,ZESTRIL) 20 MG tablet Take 1 tablet by mouth 1 day or 1 dose.      . metoprolol (LOPRESSOR) 50 MG tablet Take 50 mg by mouth 3 (three) times daily.        . Multiple Vitamin (MULTIVITAMIN) tablet Take 1 tablet by mouth daily.          Allergies  Allergen Reactions  . Sulfa Antibiotics Nausea Only    Patient Active Problem List  Diagnoses  . GERD  . DIVERTICULOSIS-COLON  . HEMOCCULT POSITIVE STOOL  .  Dysphagia  . Family history of malignant neoplasm of gastrointestinal tract  . Iron deficiency anemia, unspecified   . Other general symptoms   . GERD (gastroesophageal reflux disease)  . BRBPR (bright red blood per rectum)  . Dysphagia, unspecified  . Stricture esophagus  . Stricture of duodenum  . Benign hypertensive heart disease without heart failure  . Pure hypercholesterolemia  . Carotid stenosis, asymptomatic  . Abdominal pain  . Vitamin B12 deficiency  . Abnormal weight loss  . Internal and external hemorrhoids without complication    History  Smoking status  . Never Smoker   Smokeless tobacco  . Never Used    History  Alcohol Use No    Family History  Problem Relation Age of Onset  . Leukemia Mother   . Colon cancer Father 77  . Kidney cancer Brother   . Cancer Brother     bladder  . Stroke Sister   . Esophageal cancer Neg Hx   . Stomach cancer Neg Hx     Review of Systems: Constitutional: no fever chills diaphoresis or fatigue or change in weight.  Head and neck: no hearing loss, no epistaxis, no photophobia or visual disturbance. Respiratory: No cough, shortness of breath or wheezing. Cardiovascular: No chest pain peripheral edema,  palpitations. Gastrointestinal: No abdominal distention, no abdominal pain, no change in bowel habits hematochezia or melena. Genitourinary: No dysuria, no frequency, no urgency, no nocturia. Musculoskeletal:No arthralgias, no back pain, no gait disturbance or myalgias. Neurological: No dizziness, no headaches, no numbness, no seizures, no syncope, no weakness, no tremors. Hematologic: No lymphadenopathy, no easy bruising. Psychiatric: No confusion, no hallucinations, no sleep disturbance.    Physical Exam: Filed Vitals:   12/17/11 0943  BP: 120/70   the general appearance reveals a well-developed thin woman in no distress.  Head and neck exam revealed bilateral carotid bruits.  The chest is clear the heart reveals no  murmur gallop rub or click the abdomen is soft and nontender without hepatosplenomegaly or masses toThe pedal pulses are good.  There is no phlebitis or edema.  There is no cyanosis or clubbing. Strength is normal and symmetrical in all extremities.  There is no lateralizing weakness.  There are no sensory deficits.  The skin is warm and dry.  There is no rash.    Assessment / Plan:  Continue same medication.  Recheck in 6 months for followup office visit and lab work and EKG

## 2011-12-17 NOTE — Assessment & Plan Note (Signed)
The patient is controlling her cholesterol with diet and exercise.  She is not on any statin therapy.  Her lipids are satisfactory.

## 2011-12-17 NOTE — Assessment & Plan Note (Addendum)
Her blood pressure has been remaining stable on current therapy.  She is on an unknown blood pressure pill that she does not know the name of but it caused her to lose her hair and she will stop taking it and she will call us with the name.

## 2011-12-17 NOTE — Patient Instructions (Signed)
Hold your blood pressure medication ( call with the name so we can put in your chart causes hair loss- [phone])  Your physician wants you to follow-up in: 6 months You will receive a reminder letter in the mail two months in advance. If you don't receive a letter, please call our office to schedule the follow-up appointment.

## 2011-12-17 NOTE — Assessment & Plan Note (Signed)
The patient is followed annually by the EMS in regard to her carotid stenosis.  She's not having any TIA symptoms.

## 2011-12-18 ENCOUNTER — Telehealth: Payer: Self-pay | Admitting: Cardiology

## 2011-12-18 NOTE — Telephone Encounter (Signed)
Pt was in yesterday and was to call back re medication

## 2011-12-18 NOTE — Telephone Encounter (Signed)
The Lisinopril is what she was taking for her blood pressure.  This is what she thinks is making her hair fall out.  Was advised to call us with the name.  Put in her chart under allergies so she will not receive anymore.  Will let  Dr. Patty Sermons know

## 2011-12-23 ENCOUNTER — Other Ambulatory Visit: Payer: Self-pay | Admitting: *Deleted

## 2011-12-23 MED ORDER — METOPROLOL TARTRATE 50 MG PO TABS: 50.0000 mg | ORAL_TABLET | Freq: Three times a day (TID) | ORAL | Status: DC

## 2012-01-03 ENCOUNTER — Other Ambulatory Visit: Payer: Self-pay | Admitting: Gastroenterology

## 2012-02-26 ENCOUNTER — Other Ambulatory Visit: Payer: Self-pay | Admitting: *Deleted

## 2012-02-26 MED ORDER — DILTIAZEM HCL ER COATED BEADS 240 MG PO CP24: 240.0000 mg | ORAL_CAPSULE | Freq: Every day | ORAL | Status: DC

## 2012-02-26 NOTE — Telephone Encounter (Signed)
Fax Received. Refill Completed. Emileo Semel Chowoe (R.M.A)   

## 2012-03-14 ENCOUNTER — Other Ambulatory Visit (HOSPITAL_COMMUNITY): Payer: Self-pay | Admitting: *Deleted

## 2012-03-24 ENCOUNTER — Telehealth: Payer: Self-pay | Admitting: Cardiology

## 2012-03-24 DIAGNOSIS — E7800 Pure hypercholesterolemia, unspecified: Secondary | ICD-10-CM

## 2012-03-24 MED ORDER — PRAVASTATIN SODIUM 20 MG PO TABS: 20.0000 mg | ORAL_TABLET | Freq: Every day | ORAL | Status: DC

## 2012-03-24 NOTE — Telephone Encounter (Signed)
New msg Pt was calling about pravastatin. She wants to talk to you

## 2012-03-24 NOTE — Telephone Encounter (Signed)
Patient phoned requesting refill, no longer on med list.  Patient states she has been taking this all along.  Discussed with  Dr. Patty Sermons and and her to continue.  Will send Rx to [facility]

## 2012-05-12 ENCOUNTER — Other Ambulatory Visit: Payer: Self-pay

## 2012-05-12 ENCOUNTER — Ambulatory Visit (INDEPENDENT_AMBULATORY_CARE_PROVIDER_SITE_OTHER): Payer: Medicare Other | Admitting: Cardiology

## 2012-05-12 ENCOUNTER — Encounter: Payer: Self-pay | Admitting: Cardiology

## 2012-05-12 VITALS — BP 150/70 | HR 50 | Ht 65.0 in | Wt 126.0 lb

## 2012-05-12 DIAGNOSIS — I119 Hypertensive heart disease without heart failure: Secondary | ICD-10-CM

## 2012-05-12 DIAGNOSIS — R5383 Other fatigue: Secondary | ICD-10-CM | POA: Insufficient documentation

## 2012-05-12 DIAGNOSIS — I6529 Occlusion and stenosis of unspecified carotid artery: Secondary | ICD-10-CM

## 2012-05-12 DIAGNOSIS — E7800 Pure hypercholesterolemia, unspecified: Secondary | ICD-10-CM

## 2012-05-12 DIAGNOSIS — R5381 Other malaise: Secondary | ICD-10-CM

## 2012-05-12 DIAGNOSIS — I10 Essential (primary) hypertension: Secondary | ICD-10-CM

## 2012-05-12 DIAGNOSIS — I498 Other specified cardiac arrhythmias: Secondary | ICD-10-CM

## 2012-05-12 DIAGNOSIS — R001 Bradycardia, unspecified: Secondary | ICD-10-CM

## 2012-05-12 LAB — CBC WITH DIFFERENTIAL/PLATELET
Basophils Relative: 0.2 % (ref 0.0–3.0)
Eosinophils Relative: 2.2 % (ref 0.0–5.0)
Hemoglobin: 13.7 g/dL (ref 12.0–15.0)
Lymphocytes Relative: 33.1 % (ref 12.0–46.0)
Monocytes Relative: 7 % (ref 3.0–12.0)
Neutro Abs: 4.1 10*3/uL (ref 1.4–7.7)
RBC: 4.26 Mil/uL (ref 3.87–5.11)

## 2012-05-12 LAB — TSH: TSH: 1.43 u[IU]/mL (ref 0.35–5.50)

## 2012-05-12 MED ORDER — DILTIAZEM HCL ER COATED BEADS 120 MG PO CP24: 120.0000 mg | ORAL_CAPSULE | Freq: Every day | ORAL | Status: DC

## 2012-05-12 NOTE — Progress Notes (Addendum)
Valerie [name] Date of Birth:  October 27, 1927 [facility] [address] [place], [place]  [address] [phone]         Fax   [phone]  History of Present Illness: This pleasant 78 year old woman is seen for a scheduled followup office visit.  He has a past history of carotid artery disease and a past history of essential hypertension and a history of hypercholesterolemia.  She has not been feeling as well recently.  She has complained of feeling tired and having no energy and feeling weak.  She has not been taking her metoprolol.  Despite being off metoprolol her heart rate is only 50 and this could be contributing to her malaise and fatigue.  She is not known to have any problem with thyroid or anemia.  Current Outpatient Prescriptions  Medication Sig Dispense Refill  . ALPRAZolam (XANAX) 0.25 MG tablet as needed.      Marland Kitchen aspirin 81 MG tablet Take 81 mg by mouth daily.        . cyanocobalamin (,VITAMIN B-12,) 1000 MCG/ML injection Please give injection into muscle weekly for 2 weeks and then begin monthly injections for one year.  1 mL  0  . diltiazem (CARDIZEM CD) 120 MG 24 hr capsule Take 1 capsule (120 mg total) by mouth daily.  30 capsule  5  . hydrochlorothiazide (HYDRODIURIL) 25 MG tablet Take 0.5 tablets (12.5 mg total) by mouth daily.  30 tablet  11  . latanoprost (XALATAN) 0.005 % ophthalmic solution Place 1 drop into both eyes Twice daily.      . Multiple Vitamin (MULTIVITAMIN) tablet Take 1 tablet by mouth daily.        Marland Kitchen NEXIUM 40 MG capsule TAKE 1 CAPSULE BY MOUTH DAILY BEFORE BREAKFAST  30 each  2  . pravastatin (PRAVACHOL) 20 MG tablet Take 1 tablet (20 mg total) by mouth daily.  30 tablet  11  . metoprolol (LOPRESSOR) 50 MG tablet Take 1 tablet (50 mg total) by mouth 3 (three) times daily.  90 tablet  7  . timolol (TIMOPTIC) 0.25 % ophthalmic solution as directed.      Marland Kitchen DISCONTD: lisinopril (PRINIVIL,ZESTRIL) 20 MG tablet Take 1 tablet by mouth 1 day or 1  dose.        Allergies  Allergen Reactions  . Sulfa Antibiotics Nausea Only  . Lisinopril     Hair loss    Patient Active Problem List  Diagnosis  . GERD  . DIVERTICULOSIS-COLON  . HEMOCCULT POSITIVE STOOL  . Dysphagia  . Family history of malignant neoplasm of gastrointestinal tract  . Iron deficiency anemia, unspecified   . Other general symptoms   . GERD (gastroesophageal reflux disease)  . BRBPR (bright red blood per rectum)  . Dysphagia, unspecified  . Stricture esophagus  . Stricture of duodenum  . Benign hypertensive heart disease without heart failure  . Pure hypercholesterolemia  . Carotid stenosis, asymptomatic  . Abdominal pain  . Vitamin B12 deficiency  . Abnormal weight loss  . Internal and external hemorrhoids without complication    History  Smoking status  . Never Smoker   Smokeless tobacco  . Never Used    History  Alcohol Use No    Family History  Problem Relation Age of Onset  . Leukemia Mother   . Colon cancer Father 29  . Kidney cancer Brother   . Cancer Brother     bladder  . Stroke Sister   . Esophageal cancer Neg  Hx   . Stomach cancer Neg Hx     Review of Systems: Constitutional: no fever chills diaphoresis or fatigue or change in weight.  Head and neck: no hearing loss, no epistaxis, no photophobia or visual disturbance. Respiratory: No cough, shortness of breath or wheezing. Cardiovascular: No chest pain peripheral edema, palpitations. Gastrointestinal: No abdominal distention, no abdominal pain, no change in bowel habits hematochezia or melena. Genitourinary: No dysuria, no frequency, no urgency, no nocturia. Musculoskeletal:No arthralgias, no back pain, no gait disturbance or myalgias. Neurological: No dizziness, no headaches, no numbness, no seizures, no syncope, no weakness, no tremors. Hematologic: No lymphadenopathy, no easy bruising. Psychiatric: No confusion, no hallucinations, no sleep disturbance.    Physical  Exam: Filed Vitals:   05/12/12 1005  BP: 150/70  Pulse: 50   the general appearance reveals a well-developed well-nourished woman in no distress.The head and neck exam reveals pupils equal and reactive.  Extraocular movements are full.  There is no scleral icterus.  The mouth and pharynx are normal.  The neck is supple.  The carotids reveal soft carotid bruits. The jugular venous pressure is normal.  The  thyroid is not enlarged.  There is no lymphadenopathy.  The chest is clear to percussion and auscultation.  There are no rales or rhonchi.  Expansion of the chest is symmetrical.  The precordium is quiet.  The first heart sound is normal.  The second heart sound is physiologically split.  There is no murmur gallop rub or click.  There is no abnormal lift or heave.  The abdomen is soft and nontender.  The bowel sounds are normal.  The liver and spleen are not enlarged.  There are no abdominal masses.  There are no abdominal bruits.  Extremities reveal good pedal pulses.  There is no phlebitis or edema.  There is no cyanosis or clubbing.  Strength is normal and symmetrical in all extremities.  There is no lateralizing weakness.  There are no sensory deficits.  The skin is warm and dry.  There is no rash.  EKG shows marked sinus bradycardia at 50 per minute with first degree AV block and with premature atrial complexes.  No ischemic changes   Assessment / Plan: Reduce diltiazem CD to 120 mg daily.  She is already off beta blocker.  Return in 4 months for followup office visit EKG lipid panel hepatic function panel and basal metabolic panel.

## 2012-05-12 NOTE — Assessment & Plan Note (Signed)
It is likely that her lack of energy may be secondary to her marked bradycardia.  This should be improved by cutting back on her diltiazem.  We are also checking a CBC and a TSH today

## 2012-05-12 NOTE — Assessment & Plan Note (Signed)
Her weight has been stable.  She is watching her diet.  She is on pravastatin 20 mg daily and appears to be tolerating it okay

## 2012-05-12 NOTE — Patient Instructions (Addendum)
Your physician recommends that you schedule a follow-up appointment in: 4 months with fasting labs  Your physician recommends that you return for lab work in: today (lipid, liver, bmet, cbc, tsh) and in 4 months (ekg, liver, lipid, bmet)  Your physician has recommended you make the following change in your medication: DECREASE your Diltiazem to 120mg  daily

## 2012-05-12 NOTE — Assessment & Plan Note (Signed)
Patient has a past history of essential hypertension.  No history of congestive heart failure.  She has been on diltiazem CD 240 mg daily which may be contributing to her bradycardia and we will reduce her dose to just 120 mg daily to allow a faster heart rate.  If her pulse remains low we may consider switch off diltiazem altogether to something like amlodipine instead.

## 2012-05-12 NOTE — Progress Notes (Signed)
Quick Note:  Please report to patient. The recent labs are stable. Continue same medication and careful diet. ______ 

## 2012-05-12 NOTE — Assessment & Plan Note (Signed)
The patient has a history of bilateral carotid bruits and is followed for this by Dr. Cari Caraway.  She has not been experiencing any TIA symptoms.

## 2012-05-13 ENCOUNTER — Telehealth: Payer: Self-pay | Admitting: *Deleted

## 2012-05-13 NOTE — Telephone Encounter (Signed)
Advised patient of lab results  

## 2012-05-13 NOTE — Telephone Encounter (Signed)
Message copied by Burnell Blanks on Fri May 13, 2012  5:57 PM ------      Message from: Cassell Clement      Created: Thu May 12, 2012  3:31 PM       Please report to patient.  The recent labs are stable. Continue same medication and careful diet.

## 2012-06-03 ENCOUNTER — Other Ambulatory Visit: Payer: Self-pay | Admitting: *Deleted

## 2012-06-03 DIAGNOSIS — F419 Anxiety disorder, unspecified: Secondary | ICD-10-CM

## 2012-06-03 MED ORDER — ALPRAZOLAM 0.25 MG PO TABS: 0.2500 mg | ORAL_TABLET | ORAL | Status: DC | PRN

## 2012-06-03 NOTE — Telephone Encounter (Signed)
Refilled alprazolam 

## 2012-06-24 ENCOUNTER — Other Ambulatory Visit: Payer: Self-pay | Admitting: Gastroenterology

## 2012-09-06 ENCOUNTER — Ambulatory Visit: Payer: Medicare Other | Admitting: Cardiology

## 2012-09-06 ENCOUNTER — Other Ambulatory Visit: Payer: Medicare Other

## 2012-09-13 ENCOUNTER — Other Ambulatory Visit: Payer: Self-pay | Admitting: *Deleted

## 2012-09-13 ENCOUNTER — Encounter: Payer: Self-pay | Admitting: Vascular Surgery

## 2012-09-13 DIAGNOSIS — R5381 Other malaise: Secondary | ICD-10-CM

## 2012-09-13 DIAGNOSIS — R5383 Other fatigue: Secondary | ICD-10-CM

## 2012-09-13 DIAGNOSIS — E7800 Pure hypercholesterolemia, unspecified: Secondary | ICD-10-CM

## 2012-09-13 DIAGNOSIS — I119 Hypertensive heart disease without heart failure: Secondary | ICD-10-CM

## 2012-09-14 ENCOUNTER — Ambulatory Visit (INDEPENDENT_AMBULATORY_CARE_PROVIDER_SITE_OTHER): Payer: Medicare Other | Admitting: Cardiology

## 2012-09-14 ENCOUNTER — Other Ambulatory Visit (INDEPENDENT_AMBULATORY_CARE_PROVIDER_SITE_OTHER): Payer: Medicare Other

## 2012-09-14 ENCOUNTER — Encounter: Payer: Self-pay | Admitting: Vascular Surgery

## 2012-09-14 ENCOUNTER — Encounter: Payer: Self-pay | Admitting: Cardiology

## 2012-09-14 ENCOUNTER — Ambulatory Visit (INDEPENDENT_AMBULATORY_CARE_PROVIDER_SITE_OTHER): Payer: Medicare Other | Admitting: Vascular Surgery

## 2012-09-14 ENCOUNTER — Other Ambulatory Visit (INDEPENDENT_AMBULATORY_CARE_PROVIDER_SITE_OTHER): Payer: Medicare Other | Admitting: *Deleted

## 2012-09-14 VITALS — BP 154/59 | HR 59 | Resp 18 | Ht 65.0 in | Wt 125.1 lb

## 2012-09-14 VITALS — BP 176/54 | HR 84 | Resp 18 | Ht 64.0 in | Wt 123.0 lb

## 2012-09-14 DIAGNOSIS — R5381 Other malaise: Secondary | ICD-10-CM

## 2012-09-14 DIAGNOSIS — I6529 Occlusion and stenosis of unspecified carotid artery: Secondary | ICD-10-CM

## 2012-09-14 DIAGNOSIS — I119 Hypertensive heart disease without heart failure: Secondary | ICD-10-CM

## 2012-09-14 DIAGNOSIS — E7800 Pure hypercholesterolemia, unspecified: Secondary | ICD-10-CM

## 2012-09-14 DIAGNOSIS — R0989 Other specified symptoms and signs involving the circulatory and respiratory systems: Secondary | ICD-10-CM

## 2012-09-14 DIAGNOSIS — R002 Palpitations: Secondary | ICD-10-CM

## 2012-09-14 LAB — CBC WITH DIFFERENTIAL/PLATELET
Eosinophils Relative: 0.8 % (ref 0.0–5.0)
HCT: 41.9 % (ref 36.0–46.0)
Hemoglobin: 14.1 g/dL (ref 12.0–15.0)
Lymphs Abs: 2.8 10*3/uL (ref 0.7–4.0)
MCV: 93.8 fl (ref 78.0–100.0)
Monocytes Absolute: 0.5 10*3/uL (ref 0.1–1.0)
Neutro Abs: 7.1 10*3/uL (ref 1.4–7.7)
Platelets: 304 10*3/uL (ref 150.0–400.0)
RDW: 13.2 % (ref 11.5–14.6)
WBC: 10.6 10*3/uL — ABNORMAL HIGH (ref 4.5–10.5)

## 2012-09-14 LAB — LIPID PANEL
LDL Cholesterol: 119 mg/dL — ABNORMAL HIGH (ref 0–99)
VLDL: 24.2 mg/dL (ref 0.0–40.0)

## 2012-09-14 LAB — HEPATIC FUNCTION PANEL
Bilirubin, Direct: 0.1 mg/dL (ref 0.0–0.3)
Total Bilirubin: 0.7 mg/dL (ref 0.3–1.2)

## 2012-09-14 LAB — BASIC METABOLIC PANEL
BUN: 17 mg/dL (ref 6–23)
Chloride: 105 mEq/L (ref 96–112)
Glucose, Bld: 96 mg/dL (ref 70–99)
Potassium: 4 mEq/L (ref 3.5–5.1)

## 2012-09-14 LAB — TSH: TSH: 1.59 u[IU]/mL (ref 0.35–5.50)

## 2012-09-14 NOTE — Assessment & Plan Note (Signed)
The patient has not had any TIA symptoms.  He has bilateral soft carotid bruits and she is getting carotid Doppler study today

## 2012-09-14 NOTE — Progress Notes (Signed)
[facility]  Patient name: Valerie [name] MRN: [id number] DOB: 1927/12/09 Sex: female  REASON FOR VISIT: follow up of carotid disease  HPI: Valerie [name] is a 78 y.o. female with known mild bilateral carotid disease. She comes in for a routine yearly duplex scan. Since she was seen last, she denies any history of stroke, TIAs, expressive or receptive aphasia, or amaurosis fugax. There has been no significant change in her medical history. She does take 81 mg of aspirin a day.   REVIEW OF SYSTEMS: Arly.Keller ] denotes positive finding; [  ] denotes negative finding  CARDIOVASCULAR:  [ ]  chest pain   [ ]  dyspnea on exertion    CONSTITUTIONAL:  [ ]  fever   [ ]  chills  PHYSICAL EXAM: Filed Vitals:   09/14/12 1421 09/14/12 1422  BP: 155/53 176/54  Pulse: 80 84  Resp: 18   Height: 5\' 4"  (1.626 m)   Weight: 123 lb (55.792 kg)    Body mass index is 21.11 kg/(m^2). GENERAL: The patient is a well-nourished female, in no acute distress. The vital signs are documented above. CARDIOVASCULAR: There is a regular rate and rhythm. She has bilateral carotid bruits.  PULMONARY: There is good air exchange bilaterally without wheezing or rales. NEURO: There is no focal weakness or paresthesias.  I have independently interpreted her carotid duplex scan shows less than 39% carotid stenosis bilaterally. She does have some tortuosity bilaterally in the mid segments.  MEDICAL ISSUES:  Occlusion and stenosis of carotid artery without mention of cerebral infarction This patient has mild carotid disease bilaterally and is asymptomatic. I think it would be safe to stretch her followed up out to 18 months. I ordered follow up carotid duplex scan in 18 months. We will see her back at that time. She knows to call sooner if she has problems. In the meantime she knows to continue taking her aspirin.   DICKSON,CHRISTOPHER S [facility] Beeper: [phone]

## 2012-09-14 NOTE — Assessment & Plan Note (Signed)
Patient is on pravastatin for her hypercholesterolemia.  Blood work is pending.  No myalgias.

## 2012-09-14 NOTE — Assessment & Plan Note (Signed)
Patient has not had any dizziness or syncope.  No symptoms of CHF

## 2012-09-14 NOTE — Assessment & Plan Note (Signed)
This patient has mild carotid disease bilaterally and is asymptomatic. I think it would be safe to stretch her followed up out to 18 months. I ordered follow up carotid duplex scan in 18 months. We will see her back at that time. She knows to call sooner if she has problems. In the meantime she knows to continue taking her aspirin.

## 2012-09-14 NOTE — Progress Notes (Signed)
Valerie [name] Date of Birth:  Jul 16, 1928 [facility] [address] [place], [place]  [address] [phone]         Fax   [phone]  History of Present Illness: This pleasant 78 year old woman is seen for a scheduled followup office visit. He has a past history of carotid artery disease and a past history of essential hypertension and a history of hypercholesterolemia.  His last visit she's been doing fair.  She's having occasional palpitations.  Not had any prolonged chest pain.  She had recent colonoscopy which was okay.   Current Outpatient Prescriptions  Medication Sig Dispense Refill  . ALPRAZolam (XANAX) 0.25 MG tablet Take 1 tablet (0.25 mg total) by mouth as needed.  30 tablet  3  . aspirin 81 MG tablet Take 81 mg by mouth daily.        . cyanocobalamin (,VITAMIN B-12,) 1000 MCG/ML injection Please give injection into muscle weekly for 2 weeks and then begin monthly injections for one year.  1 mL  0  . diltiazem (CARDIZEM CD) 120 MG 24 hr capsule Take 1 capsule (120 mg total) by mouth daily.  30 capsule  5  . hydrochlorothiazide (HYDRODIURIL) 25 MG tablet Take 0.5 tablets (12.5 mg total) by mouth daily.  30 tablet  11  . latanoprost (XALATAN) 0.005 % ophthalmic solution Place 1 drop into both eyes Twice daily.      . metoprolol (LOPRESSOR) 50 MG tablet Take 1 tablet (50 mg total) by mouth 3 (three) times daily.  90 tablet  7  . Multiple Vitamin (MULTIVITAMIN) tablet Take 1 tablet by mouth daily.        Marland Kitchen NEXIUM 40 MG capsule TAKE 1 CAPSULE BY MOUTH DAILY BEFORE BREAKFAST  30 capsule  3  . pravastatin (PRAVACHOL) 20 MG tablet Take 1 tablet (20 mg total) by mouth daily.  30 tablet  11  . timolol (TIMOPTIC) 0.25 % ophthalmic solution as directed.      . [DISCONTINUED] lisinopril (PRINIVIL,ZESTRIL) 20 MG tablet Take 1 tablet by mouth 1 day or 1 dose.        Allergies  Allergen Reactions  . Sulfa Antibiotics Nausea Only  . Lisinopril     Hair loss     Patient Active Problem List  Diagnosis  . GERD  . DIVERTICULOSIS-COLON  . HEMOCCULT POSITIVE STOOL  . Dysphagia  . Family history of malignant neoplasm of gastrointestinal tract  . Iron deficiency anemia, unspecified   . Other general symptoms   . GERD (gastroesophageal reflux disease)  . BRBPR (bright red blood per rectum)  . Dysphagia, unspecified  . Stricture esophagus  . Stricture of duodenum  . Benign hypertensive heart disease without heart failure  . Pure hypercholesterolemia  . Carotid stenosis, asymptomatic  . Abdominal pain  . Vitamin B12 deficiency  . Abnormal weight loss  . Internal and external hemorrhoids without complication  . Malaise and fatigue  . Occlusion and stenosis of carotid artery without mention of cerebral infarction    History  Smoking status  . Never Smoker   Smokeless tobacco  . Never Used    History  Alcohol Use No    Family History  Problem Relation Age of Onset  . Leukemia Mother   . Colon cancer Father 53  . Kidney cancer Brother   . Cancer Brother     bladder  . Stroke Sister   . Esophageal cancer Neg Hx   . Stomach cancer Neg Hx  Review of Systems: Constitutional: no fever chills diaphoresis or fatigue or change in weight.  Head and neck: no hearing loss, no epistaxis, no photophobia or visual disturbance. Respiratory: No cough, shortness of breath or wheezing. Cardiovascular: No chest pain peripheral edema, palpitations. Gastrointestinal: No abdominal distention, no abdominal pain, no change in bowel habits hematochezia or melena. Genitourinary: No dysuria, no frequency, no urgency, no nocturia. Musculoskeletal:No arthralgias, no back pain, no gait disturbance or myalgias. Neurological: No dizziness, no headaches, no numbness, no seizures, no syncope, no weakness, no tremors. Hematologic: No lymphadenopathy, no easy bruising. Psychiatric: No confusion, no hallucinations, no sleep disturbance.    Physical  Exam: Filed Vitals:   09/14/12 0950  BP: 154/59  Pulse: 59  Resp: 18   appearance reveals a well-developed well-nourished woman in no distress.The head and neck exam reveals pupils equal and reactive.  Extraocular movements are full.  There is no scleral icterus.  The mouth and pharynx are normal.  The neck is supple.  The carotids reveal soft bruits bilaterally.  The jugular venous pressure is normal.  The  thyroid is not enlarged.  There is no lymphadenopathy.  The chest is clear to percussion and auscultation.  There are no rales or rhonchi.  Expansion of the chest is symmetrical.  The precordium is quiet.  The first heart sound is normal.  The second heart sound is physiologically split.  There is no murmur gallop rub or click.  There is no abnormal lift or heave.  The abdomen is soft and nontender.  The bowel sounds are normal.  The liver and spleen are not enlarged.  There are no abdominal masses.  There are no abdominal bruits.  Extremities reveal good pedal pulses.  There is no phlebitis or edema.  There is no cyanosis or clubbing.  Strength is normal and symmetrical in all extremities.  There is no lateralizing weakness.  There are no sensory deficits.  The skin is warm and dry.  There is no rash.   EKG shows sinus bradycardia and is otherwise within normal limits  Assessment / Plan: Continue same medication.  Recheck in 4 months for office visit lipid panel hepatic function panel and basal metabolic panel

## 2012-09-14 NOTE — Patient Instructions (Addendum)
Your physician recommends that you continue on your current medications as directed. Please refer to the Current Medication list given to you today.  Your physician wants you to follow-up in: 4 months with fasting labs (lp/bmet/hfp)  You will receive a reminder letter in the mail two months in advance. If you don't receive a letter, please call our office to schedule the follow-up appointment.   Will obtain labs today and call you with the results (lp/bmet/hfp/cbc/tsh)  Keep your appointment this afternoon for your doppler

## 2012-09-14 NOTE — Progress Notes (Signed)
Quick Note:  Please report to patient. The recent labs are stable. Continue same medication and careful diet.Thyroid normal. LDL higher. Watch diet. ______

## 2012-09-15 NOTE — Addendum Note (Signed)
Addended by: Sharee Pimple on: 09/15/2012 08:34 AM   Modules accepted: Orders

## 2012-09-20 ENCOUNTER — Telehealth: Payer: Self-pay | Admitting: *Deleted

## 2012-09-20 NOTE — Telephone Encounter (Signed)
Message copied by Burnell Blanks on Tue Sep 20, 2012  9:11 AM ------      Message from: Cassell Clement      Created: Wed Sep 14, 2012  9:22 PM       Please report to patient.  The recent labs are stable. Continue same medication and careful diet.Thyroid normal. LDL higher.  Watch diet.

## 2012-09-20 NOTE — Telephone Encounter (Signed)
Advised patient of lab results  

## 2012-10-20 ENCOUNTER — Other Ambulatory Visit: Payer: Self-pay | Admitting: Gastroenterology

## 2012-10-20 MED ORDER — ESOMEPRAZOLE MAGNESIUM 40 MG PO CPDR: 40.0000 mg | DELAYED_RELEASE_CAPSULE | Freq: Every day | ORAL | Status: DC

## 2012-10-20 NOTE — Telephone Encounter (Signed)
Prescription mailed.

## 2012-11-15 ENCOUNTER — Other Ambulatory Visit (HOSPITAL_COMMUNITY): Payer: Self-pay | Admitting: Family Medicine

## 2012-11-15 DIAGNOSIS — Z139 Encounter for screening, unspecified: Secondary | ICD-10-CM

## 2012-11-21 ENCOUNTER — Ambulatory Visit (HOSPITAL_COMMUNITY)
Admission: RE | Admit: 2012-11-21 | Discharge: 2012-11-21 | Disposition: A | Discharge status: Home / Self Care | Payer: Medicare PPO | Source: Ambulatory Visit | Attending: Family Medicine | Admitting: Family Medicine

## 2012-11-21 DIAGNOSIS — Z139 Encounter for screening, unspecified: Secondary | ICD-10-CM

## 2012-11-21 DIAGNOSIS — Z1231 Encounter for screening mammogram for malignant neoplasm of breast: Secondary | ICD-10-CM | POA: Insufficient documentation

## 2012-11-25 ENCOUNTER — Other Ambulatory Visit: Payer: Self-pay | Admitting: Family Medicine

## 2012-11-25 DIAGNOSIS — R928 Other abnormal and inconclusive findings on diagnostic imaging of breast: Secondary | ICD-10-CM

## 2012-12-05 ENCOUNTER — Other Ambulatory Visit: Payer: Self-pay | Admitting: *Deleted

## 2012-12-05 MED ORDER — HYDROCHLOROTHIAZIDE 25 MG PO TABS: 12.5000 mg | ORAL_TABLET | Freq: Every day | ORAL | Status: DC

## 2012-12-07 ENCOUNTER — Ambulatory Visit (HOSPITAL_COMMUNITY)
Admission: RE | Admit: 2012-12-07 | Discharge: 2012-12-07 | Disposition: A | Discharge status: Home / Self Care | Payer: Medicare PPO | Source: Ambulatory Visit | Attending: Family Medicine | Admitting: Family Medicine

## 2012-12-07 DIAGNOSIS — R928 Other abnormal and inconclusive findings on diagnostic imaging of breast: Secondary | ICD-10-CM | POA: Insufficient documentation

## 2012-12-12 ENCOUNTER — Telehealth: Payer: Self-pay | Admitting: Cardiology

## 2012-12-12 MED ORDER — DILTIAZEM HCL ER COATED BEADS 120 MG PO CP24: 120.0000 mg | ORAL_CAPSULE | Freq: Every day | ORAL | Status: DC

## 2012-12-12 NOTE — Telephone Encounter (Signed)
Both feet swelling but left is worse than right. Around ankle is red and stinging, goes up into leg. Feels warm to touch.  Someone at Dr [facility] office increased Diltiazem to 240 mg, increased Pravachol to 40 mg, and started Amlodipine 10 mg daily. Blood pressure 195/70 at visit.  All these changes about 2 weeks, swelling started after. Blood pressure has improved some. Will forward to  Dr. Patty Sermons for review.

## 2012-12-12 NOTE — Telephone Encounter (Signed)
Advised patient

## 2012-12-12 NOTE — Telephone Encounter (Signed)
New problem   Pt is having swelling and redness on left foot and ankle for 2wks. Pt want to know what does she need to do about this matter.

## 2012-12-12 NOTE — Telephone Encounter (Signed)
The swelling is probably from the amlodipine.  Try leaving it off for several days and then restart it at just a half a tablet a day and see if that helps the swelling.  Avoid dietary salt.

## 2012-12-13 ENCOUNTER — Other Ambulatory Visit: Payer: Self-pay | Admitting: *Deleted

## 2012-12-13 MED ORDER — METOPROLOL TARTRATE 50 MG PO TABS: 50.0000 mg | ORAL_TABLET | Freq: Three times a day (TID) | ORAL | Status: DC

## 2012-12-14 ENCOUNTER — Other Ambulatory Visit: Payer: Self-pay

## 2012-12-14 MED ORDER — DILTIAZEM HCL ER COATED BEADS 120 MG PO CP24: 120.0000 mg | ORAL_CAPSULE | Freq: Every day | ORAL | Status: DC

## 2012-12-19 ENCOUNTER — Ambulatory Visit (INDEPENDENT_AMBULATORY_CARE_PROVIDER_SITE_OTHER): Payer: Medicare HMO

## 2012-12-19 DIAGNOSIS — E538 Deficiency of other specified B group vitamins: Secondary | ICD-10-CM

## 2012-12-19 MED ORDER — CYANOCOBALAMIN 1000 MCG/ML IJ SOLN
1000.0000 ug | INTRAMUSCULAR | Status: DC
  Administered 2012-12-19 – 2014-05-11 (×15): 1000 ug via INTRAMUSCULAR

## 2012-12-21 ENCOUNTER — Other Ambulatory Visit: Payer: Self-pay | Admitting: *Deleted

## 2012-12-21 DIAGNOSIS — F419 Anxiety disorder, unspecified: Secondary | ICD-10-CM

## 2012-12-21 MED ORDER — ALPRAZOLAM 0.25 MG PO TABS: 0.2500 mg | ORAL_TABLET | ORAL | Status: DC | PRN

## 2013-01-16 ENCOUNTER — Encounter: Payer: Self-pay | Admitting: Cardiology

## 2013-01-16 ENCOUNTER — Ambulatory Visit (INDEPENDENT_AMBULATORY_CARE_PROVIDER_SITE_OTHER): Payer: Medicare PPO | Admitting: Cardiology

## 2013-01-16 VITALS — BP 138/68 | HR 64 | Ht 65.0 in | Wt 129.1 lb

## 2013-01-16 DIAGNOSIS — R5383 Other fatigue: Secondary | ICD-10-CM

## 2013-01-16 DIAGNOSIS — E7800 Pure hypercholesterolemia, unspecified: Secondary | ICD-10-CM

## 2013-01-16 DIAGNOSIS — R5381 Other malaise: Secondary | ICD-10-CM

## 2013-01-16 DIAGNOSIS — I119 Hypertensive heart disease without heart failure: Secondary | ICD-10-CM

## 2013-01-16 LAB — BASIC METABOLIC PANEL
BUN: 18 mg/dL (ref 6–23)
CO2: 30 mEq/L (ref 19–32)
Chloride: 103 mEq/L (ref 96–112)
Creatinine, Ser: 0.7 mg/dL (ref 0.4–1.2)

## 2013-01-16 LAB — LIPID PANEL
Cholesterol: 176 mg/dL (ref 0–200)
LDL Cholesterol: 104 mg/dL — ABNORMAL HIGH (ref 0–99)

## 2013-01-16 LAB — HEPATIC FUNCTION PANEL
Alkaline Phosphatase: 84 U/L (ref 39–117)
Bilirubin, Direct: 0.1 mg/dL (ref 0.0–0.3)
Total Protein: 7.7 g/dL (ref 6.0–8.3)

## 2013-01-16 MED ORDER — AMLODIPINE BESYLATE 5 MG PO TABS: 5.0000 mg | ORAL_TABLET | Freq: Every day | ORAL | Status: DC

## 2013-01-16 NOTE — Progress Notes (Signed)
Quick Note:  Please report to patient. The recent labs are stable. Continue same medication and careful diet. ______ 

## 2013-01-16 NOTE — Progress Notes (Signed)
Valerie [name] Date of Birth:  05-05-1928 [facility] [address] [place], [place]  [address] [phone]         Fax   [phone]  History of Present Illness: This pleasant 79 year old woman is seen for a scheduled followup office visit. He has a past history of carotid artery disease and a past history of essential hypertension and a history of hypercholesterolemia. His last visit she's been doing fair. She's having occasional palpitations. Not had any prolonged chest pain.  Since last visit she had called [place] about some increased ankle edema.  This improved after we cut her dose of amlodipine in half.   Current Outpatient Prescriptions  Medication Sig Dispense Refill  . ALPRAZolam (XANAX) 0.25 MG tablet Take 1 tablet (0.25 mg total) by mouth as needed.  30 tablet  3  . amLODipine (NORVASC) 5 MG tablet Take 1 tablet (5 mg total) by mouth daily.  30 tablet  11  . aspirin 81 MG tablet Take 81 mg by mouth daily.        . cyanocobalamin (,VITAMIN B-12,) 1000 MCG/ML injection Please give injection into muscle weekly for 2 weeks and then begin monthly injections for one year.  1 mL  0  . diltiazem (CARDIZEM CD) 120 MG 24 hr capsule Take 1 capsule (120 mg total) by mouth daily.  30 capsule  5  . esomeprazole (NEXIUM) 40 MG capsule Take 1 capsule (40 mg total) by mouth daily before breakfast.  90 capsule  3  . hydrochlorothiazide (HYDRODIURIL) 25 MG tablet Take 0.5 tablets (12.5 mg total) by mouth daily.  30 tablet  1  . latanoprost (XALATAN) 0.005 % ophthalmic solution Place 1 drop into both eyes Twice daily.      . metoprolol (LOPRESSOR) 50 MG tablet Take 1 tablet (50 mg total) by mouth 3 (three) times daily.  90 tablet  3  . pravastatin (PRAVACHOL) 40 MG tablet Take 40 mg by mouth daily.      . timolol (TIMOPTIC) 0.25 % ophthalmic solution as directed.      . [DISCONTINUED] lisinopril (PRINIVIL,ZESTRIL) 20 MG tablet Take 1 tablet by mouth 1 day or 1 dose.       Current  Facility-Administered Medications  Medication Dose Route Frequency Provider Last Rate Last Dose  . cyanocobalamin ((VITAMIN B-12)) injection 1,000 mcg  1,000 mcg Intramuscular Q30 days Raymon Mutton, NP   1,000 mcg at 12/19/12 5643    Allergies  Allergen Reactions  . Sulfa Antibiotics Nausea Only  . Lisinopril     Hair loss    Patient Active Problem List  Diagnosis  . GERD  . DIVERTICULOSIS-COLON  . HEMOCCULT POSITIVE STOOL  . Dysphagia  . Family history of malignant neoplasm of gastrointestinal tract  . Iron deficiency anemia, unspecified   . Other general symptoms   . GERD (gastroesophageal reflux disease)  . BRBPR (bright red blood per rectum)  . Dysphagia, unspecified  . Stricture esophagus  . Stricture of duodenum  . Benign hypertensive heart disease without heart failure  . Pure hypercholesterolemia  . Carotid stenosis, asymptomatic  . Abdominal pain  . Vitamin B12 deficiency  . Abnormal weight loss  . Internal and external hemorrhoids without complication  . Malaise and fatigue  . Occlusion and stenosis of carotid artery without mention of cerebral infarction    History  Smoking status  . Never Smoker   Smokeless tobacco  . Never Used    History  Alcohol Use No  Family History  Problem Relation Age of Onset  . Leukemia Mother   . Colon cancer Father 43  . Kidney cancer Brother   . Cancer Brother     bladder  . Stroke Sister   . Esophageal cancer Neg Hx   . Stomach cancer Neg Hx     Review of Systems: Constitutional: no fever chills diaphoresis or fatigue or change in weight.  Head and neck: no hearing loss, no epistaxis, no photophobia or visual disturbance. Respiratory: No cough, shortness of breath or wheezing. Cardiovascular: No chest pain peripheral edema, palpitations. Gastrointestinal: No abdominal distention, no abdominal pain, no change in bowel habits hematochezia or melena. Genitourinary: No dysuria, no frequency, no urgency, no  nocturia. Musculoskeletal:No arthralgias, no back pain, no gait disturbance or myalgias. Neurological: No dizziness, no headaches, no numbness, no seizures, no syncope, no weakness, no tremors. Hematologic: No lymphadenopathy, no easy bruising. Psychiatric: No confusion, no hallucinations, no sleep disturbance.    Physical Exam: Filed Vitals:   01/16/13 1010  BP: 138/68  Pulse: 64   the general appearance reveals a well-developed well-nourished elderly woman in no distress.The head and neck exam reveals pupils equal and reactive.  Extraocular movements are full.  There is no scleral icterus.  The mouth and pharynx are normal.  The neck is supple.  The carotids reveal soft bruits bilaterally.  The jugular venous pressure is normal.  The  thyroid is not enlarged.  There is no lymphadenopathy.  The chest is clear to percussion and auscultation.  There are no rales or rhonchi.  Expansion of the chest is symmetrical.  The precordium is quiet.  The first heart sound is normal.  The second heart sound is physiologically split.  There is no murmur gallop rub or click.  There is no abnormal lift or heave.  The abdomen is soft and nontender.  The bowel sounds are normal.  The liver and spleen are not enlarged.  There are no abdominal masses.  There are no abdominal bruits.  Extremities reveal good pedal pulses.  There is no phlebitis or edema.  There is no cyanosis or clubbing.  Strength is normal and symmetrical in all extremities.  There is no lateralizing weakness.  There are no sensory deficits.  The skin is warm and dry.  There is no rash.     Assessment / Plan: Continue same medication.  Recheck in 4 months for followup office visit lipid panel hepatic function panel basal metabolic panel and CBC

## 2013-01-16 NOTE — Assessment & Plan Note (Signed)
The patient complains of lack of energy.  We're checking lab work today.

## 2013-01-16 NOTE — Assessment & Plan Note (Signed)
The patient has a history of hypercholesterolemia.  She is tolerating pravastatin 40 mg daily without side effects.

## 2013-01-16 NOTE — Patient Instructions (Signed)
Will obtain labs today and call you with the results (lp.bmet/hfp)  Your physician recommends that you continue on your current medications as directed. Please refer to the Current Medication list given to you today.  Your physician recommends that you schedule a follow-up appointment in: 4 months with fasting labs (lp/bmet/hfp/cbc)  

## 2013-01-16 NOTE — Assessment & Plan Note (Signed)
Her blood pressure has been remaining adequately controlled on lower dose of amlodipine 5 mg daily and her pedal edema  has improved.

## 2013-01-17 ENCOUNTER — Telehealth: Payer: Self-pay | Admitting: *Deleted

## 2013-01-17 NOTE — Telephone Encounter (Signed)
Message copied by Burnell Blanks on Tue Jan 17, 2013  5:13 PM ------      Message from: Cassell Clement      Created: Mon Jan 16, 2013  2:13 PM       Please report to patient.  The recent labs are stable. Continue same medication and careful diet. ------

## 2013-01-17 NOTE — Telephone Encounter (Signed)
Advised patient of lab results  

## 2013-01-20 ENCOUNTER — Ambulatory Visit (INDEPENDENT_AMBULATORY_CARE_PROVIDER_SITE_OTHER): Payer: Medicare HMO | Admitting: Family Medicine

## 2013-01-20 DIAGNOSIS — E538 Deficiency of other specified B group vitamins: Secondary | ICD-10-CM

## 2013-01-20 NOTE — Progress Notes (Signed)
Patient ID: Valerie [name], female   DOB: [birth year], 79 y.o.   MRN: [id number] Patient got b12 shot tolerated well

## 2013-02-22 ENCOUNTER — Ambulatory Visit (INDEPENDENT_AMBULATORY_CARE_PROVIDER_SITE_OTHER): Payer: Medicare HMO | Admitting: *Deleted

## 2013-02-22 DIAGNOSIS — E538 Deficiency of other specified B group vitamins: Secondary | ICD-10-CM

## 2013-03-27 ENCOUNTER — Ambulatory Visit (INDEPENDENT_AMBULATORY_CARE_PROVIDER_SITE_OTHER): Payer: Medicare HMO | Admitting: *Deleted

## 2013-03-27 DIAGNOSIS — D649 Anemia, unspecified: Secondary | ICD-10-CM

## 2013-03-27 DIAGNOSIS — E538 Deficiency of other specified B group vitamins: Secondary | ICD-10-CM

## 2013-04-04 ENCOUNTER — Other Ambulatory Visit: Payer: Self-pay | Admitting: Cardiology

## 2013-04-27 ENCOUNTER — Ambulatory Visit: Payer: Medicare HMO

## 2013-05-01 ENCOUNTER — Ambulatory Visit (INDEPENDENT_AMBULATORY_CARE_PROVIDER_SITE_OTHER): Payer: Medicare HMO | Admitting: *Deleted

## 2013-05-01 DIAGNOSIS — E538 Deficiency of other specified B group vitamins: Secondary | ICD-10-CM

## 2013-05-01 NOTE — Patient Instructions (Signed)
Vitamin B12 Injections Every person needs vitamin B12. A deficiency develops when the body does not get enough of it. One way to overcome this is by getting B12 shots (injections). A B12 shot puts the vitamin directly into muscle tissue. This avoids any problems your body might have in absorbing it from food or a pill. In some people, the body has trouble using the vitamin correctly. This can cause a B12 deficiency. Not consuming enough of the vitamin can also cause a deficiency. Getting enough vitamin B12 can be hard for elderly people. Sometimes, they do not eat a well-balanced diet. The elderly are also more likely than younger people to have medical conditions or take medications that can lead to a deficiency. WHAT DOES VITAMIN B12 DO? Vitamin B12 does many things to help the body work right:  It helps the body make healthy red blood cells.  It helps maintain nerve cells.  It is involved in the body's process of converting food into energy (metabolism).  It is needed to make the genetic material in all cells (DNA). VITAMIN B12 FOOD SOURCES Most people get plenty of vitamin B12 through the foods they eat. It is present in:  Meat, fish, poultry, and eggs.  Milk and milk products.  It also is added when certain foods are made, including some breads, cereals and yogurts. The food is then called "fortified". CAUSES The most common causes of vitamin B12 deficiency are:  Pernicious anemia. The condition develops when the body cannot make enough healthy red blood cells. This stems from a lack of a protein made in the stomach (intrinsic factor). People without this protein cannot absorb enough vitamin B12 from food.  Malabsorption. This is when the body cannot absorb the vitamin. It can be caused by:  Pernicious anemia.  Surgery to remove part or all of the stomach can lead to malabsorption. Removal of part or all of the small intestine can also cause malabsorption.  Vegetarian diet.  People who are strict about not eating foods from animals could have trouble taking in enough vitamin B12 from diet alone.  Medications. Some medicines have been linked to B12 deficiency, such as Metformin (a drug prescribed for type 2 diabetes). Long-term use of stomach acid suppressants also can keep the vitamin from being absorbed.  Intestinal problems such as inflammatory bowel disease. If there are problems in the digestive tract, vitamin B12 may not be absorbed in good enough amounts. SYMPTOMS People who do not get enough B12 can develop problems. These can include:  Anemia. This is when the body has too few red blood cells. Red blood cells carry oxygen to the rest of the body. Without a healthy supply of red blood cells, people can feel:  Tired (fatigued).  Weak.  Severe anemia can cause:  Shortness of breath.  Dizziness.  Rapid heart rate.  Paleness.  Other Vitamin B12 deficiency symptoms include:  Diarrhea.  Numbness or tingling in the hands or feet.  Loss of appetite.  Confusion.  Sores on the tongue or in the mouth. LET YOUR CAREGIVER KNOW ABOUT:  Any allergies. It is very important to know if you are allergic or sensitive to cobalt. Vitamin B12 contains cobalt.  Any history of kidney disease.  All medications you are taking. Include prescription and over-the-counter medicines, herbs and creams.  Whether you are pregnant or breast-feeding.  If you have Leber's disease, a hereditary eye condition, vitamin B12 could make it worse. RISKS AND COMPLICATIONS Reactions to an injection are   usually temporary. They might include:  Pain at the injection site.  Redness, swelling or tenderness at the site.  Headache, dizziness or weakness.  Nausea, upset stomach or diarrhea.  Numbness or tingling.  Fever.  Joint pain.  Itching or rash. If a reaction does not go away in a short while, talk with your healthcare provider. A change in the way the shots are  given, or where they are given, might need to be made. BEFORE AN INJECTION To decide whether B12 injections are right for you, your healthcare provider will probably:  Ask about your medical history.  Ask questions about your diet.  Ask about symptoms such as:  Have you felt weak?  Do you feel unusually tired?  Do you get dizzy?  Order blood tests. These may include a test to:  Check the level of red cells in your blood.  Measure B12 levels.  Check for the presence of intrinsic factor. VITAMIN B12 INJECTIONS How often you will need a vitamin B12 injection will depend on how severe your deficiency is. This also will affect how long you will need to get them. People with pernicious anemia usually get injections for their entire life. Others might get them for a shorter period. For many people, injections are given daily or weekly for several weeks. Then, once B12 levels are normal, injections are given just once a month. If the cause of the deficiency can be fixed, the injections can be stopped. Talk with your healthcare provider about what you should expect. For an injection:  The injection site will be cleaned with an alcohol swab.  Your healthcare provider will insert a needle directly into a muscle. Most any muscle can be used. Most often, an arm muscle is used. A buttocks muscle can also be used. Many people say shots in that area are less painful.  A small adhesive bandage may be put over the injection site. It usually can be taken off in an hour or less. Injections can be given by your healthcare provider. In some cases, family members give them. Sometimes, people give them to themselves. Talk with your healthcare provider about what would be best for you. If someone other than your healthcare provider will be giving the shots, the person will need to be trained to give them correctly. HOME CARE INSTRUCTIONS   You can remove the adhesive bandage within an hour of getting a  shot.  You should be able to go about your normal activities right away.  Avoid drinking large amounts of alcohol while taking vitamin B12 shots. Alcohol can interfere with the body's use of the vitamin. SEEK MEDICAL CARE IF:   Pain, redness, swelling or tenderness at the injection site does not get better or gets worse.  Headache, dizziness or weakness does not go away.  You develop a fever of more than 100.5 F (38.1 C). SEEK IMMEDIATE MEDICAL CARE IF:   You have chest pain.  You develop shortness of breath.  You have muscle weakness that gets worse.  You develop numbness, weakness or tingling on one side or one area of the body.  You have symptoms of an allergic reaction, such as:  Hives.  Difficulty breathing.  Swelling of the lips, face, tongue or throat.  You develop a fever of more than 102.0 F (38.9 C). MAKE SURE YOU:   Understand these instructions.  Will watch your condition.  Will get help right away if you are not doing well or get worse. Document   Released: 12/11/2008 Document Revised: 12/07/2011 Document Reviewed: 12/11/2008 [employer] Patient Information 2014 [employer].  

## 2013-05-01 NOTE — Progress Notes (Signed)
Patient tolerated well.

## 2013-05-19 ENCOUNTER — Other Ambulatory Visit (INDEPENDENT_AMBULATORY_CARE_PROVIDER_SITE_OTHER): Payer: Medicare PPO

## 2013-05-19 ENCOUNTER — Encounter: Payer: Self-pay | Admitting: Cardiology

## 2013-05-19 ENCOUNTER — Ambulatory Visit (INDEPENDENT_AMBULATORY_CARE_PROVIDER_SITE_OTHER): Payer: Medicare PPO | Admitting: Cardiology

## 2013-05-19 VITALS — BP 168/76 | HR 58 | Ht 65.0 in | Wt 127.4 lb

## 2013-05-19 DIAGNOSIS — I119 Hypertensive heart disease without heart failure: Secondary | ICD-10-CM

## 2013-05-19 DIAGNOSIS — I6529 Occlusion and stenosis of unspecified carotid artery: Secondary | ICD-10-CM

## 2013-05-19 DIAGNOSIS — E7800 Pure hypercholesterolemia, unspecified: Secondary | ICD-10-CM

## 2013-05-19 LAB — BASIC METABOLIC PANEL
CO2: 26 mEq/L (ref 19–32)
Calcium: 9.4 mg/dL (ref 8.4–10.5)
Creatinine, Ser: 0.8 mg/dL (ref 0.4–1.2)

## 2013-05-19 LAB — HEPATIC FUNCTION PANEL
AST: 17 U/L (ref 0–37)
Albumin: 3.9 g/dL (ref 3.5–5.2)
Alkaline Phosphatase: 71 U/L (ref 39–117)
Total Protein: 7.1 g/dL (ref 6.0–8.3)

## 2013-05-19 LAB — CBC WITH DIFFERENTIAL/PLATELET
Basophils Relative: 0.2 % (ref 0.0–3.0)
Hemoglobin: 13.1 g/dL (ref 12.0–15.0)
Lymphocytes Relative: 29.1 % (ref 12.0–46.0)
Monocytes Relative: 7.1 % (ref 3.0–12.0)
Neutro Abs: 5.2 10*3/uL (ref 1.4–7.7)
RBC: 4.11 Mil/uL (ref 3.87–5.11)
WBC: 8.6 10*3/uL (ref 4.5–10.5)

## 2013-05-19 LAB — LIPID PANEL
LDL Cholesterol: 94 mg/dL (ref 0–99)
Total CHOL/HDL Ratio: 4

## 2013-05-19 MED ORDER — AMLODIPINE BESYLATE 5 MG PO TABS: 10.0000 mg | ORAL_TABLET | Freq: Every day | ORAL | Status: DC

## 2013-05-19 NOTE — Assessment & Plan Note (Signed)
Patient is having no myalgias from her pravastatin.  Blood work is pending.
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
## 2013-05-19 NOTE — Patient Instructions (Addendum)
Your physician has recommended you make the following change in your medication:   1. Increase Amlodipine to 10 mg daily.  Your physician recommends that you return for lab work in: 4 months , CBC , LP, BMET, HFP,   Your physician wants you to follow-up in: 4 months with Dr. Patty Sermons. You will receive a reminder letter in the mail two months in advance. If you don't receive a letter, please call our office to schedule the follow-up appointment. EKG

## 2013-05-19 NOTE — Assessment & Plan Note (Signed)
The patient is not having any headaches or dizzy spells.  Blood pressure has generally been satisfactory.

## 2013-05-19 NOTE — Assessment & Plan Note (Signed)
The patient has not been having any TIA or stroke symptoms.  She remains on daily aspirin

## 2013-05-21 NOTE — Progress Notes (Signed)
Quick Note:  Please report to patient. The recent labs are stable. Continue same medication and careful diet.Potassium borderline low so increase potassium in diet. ______

## 2013-05-30 ENCOUNTER — Telehealth: Payer: Self-pay | Admitting: *Deleted

## 2013-05-30 NOTE — Telephone Encounter (Signed)
Message copied by Burnell Blanks on Tue May 30, 2013  2:51 PM ------      Message from: Cassell Clement      Created: Sun May 21, 2013  8:07 PM       Please report to patient.  The recent labs are stable. Continue same medication and careful diet.Potassium borderline low so increase potassium in diet. ------

## 2013-05-30 NOTE — Telephone Encounter (Signed)
Advised patient of lab results  

## 2013-06-08 ENCOUNTER — Ambulatory Visit (INDEPENDENT_AMBULATORY_CARE_PROVIDER_SITE_OTHER): Payer: Medicare HMO | Admitting: *Deleted

## 2013-06-08 DIAGNOSIS — E538 Deficiency of other specified B group vitamins: Secondary | ICD-10-CM

## 2013-06-08 NOTE — Patient Instructions (Signed)
Vitamin B12 Injections Every person needs vitamin B12. A deficiency develops when the body does not get enough of it. One way to overcome this is by getting B12 shots (injections). A B12 shot puts the vitamin directly into muscle tissue. This avoids any problems your body might have in absorbing it from food or a pill. In some people, the body has trouble using the vitamin correctly. This can cause a B12 deficiency. Not consuming enough of the vitamin can also cause a deficiency. Getting enough vitamin B12 can be hard for elderly people. Sometimes, they do not eat a well-balanced diet. The elderly are also more likely than younger people to have medical conditions or take medications that can lead to a deficiency. WHAT DOES VITAMIN B12 DO? Vitamin B12 does many things to help the body work right:  It helps the body make healthy red blood cells.  It helps maintain nerve cells.  It is involved in the body's process of converting food into energy (metabolism).  It is needed to make the genetic material in all cells (DNA). VITAMIN B12 FOOD SOURCES Most people get plenty of vitamin B12 through the foods they eat. It is present in:  Meat, fish, poultry, and eggs.  Milk and milk products.  It also is added when certain foods are made, including some breads, cereals and yogurts. The food is then called "fortified". CAUSES The most common causes of vitamin B12 deficiency are:  Pernicious anemia. The condition develops when the body cannot make enough healthy red blood cells. This stems from a lack of a protein made in the stomach (intrinsic factor). People without this protein cannot absorb enough vitamin B12 from food.  Malabsorption. This is when the body cannot absorb the vitamin. It can be caused by:  Pernicious anemia.  Surgery to remove part or all of the stomach can lead to malabsorption. Removal of part or all of the small intestine can also cause malabsorption.  Vegetarian diet.  People who are strict about not eating foods from animals could have trouble taking in enough vitamin B12 from diet alone.  Medications. Some medicines have been linked to B12 deficiency, such as Metformin (a drug prescribed for type 2 diabetes). Long-term use of stomach acid suppressants also can keep the vitamin from being absorbed.  Intestinal problems such as inflammatory bowel disease. If there are problems in the digestive tract, vitamin B12 may not be absorbed in good enough amounts. SYMPTOMS People who do not get enough B12 can develop problems. These can include:  Anemia. This is when the body has too few red blood cells. Red blood cells carry oxygen to the rest of the body. Without a healthy supply of red blood cells, people can feel:  Tired (fatigued).  Weak.  Severe anemia can cause:  Shortness of breath.  Dizziness.  Rapid heart rate.  Paleness.  Other Vitamin B12 deficiency symptoms include:  Diarrhea.  Numbness or tingling in the hands or feet.  Loss of appetite.  Confusion.  Sores on the tongue or in the mouth. LET YOUR CAREGIVER KNOW ABOUT:  Any allergies. It is very important to know if you are allergic or sensitive to cobalt. Vitamin B12 contains cobalt.  Any history of kidney disease.  All medications you are taking. Include prescription and over-the-counter medicines, herbs and creams.  Whether you are pregnant or breast-feeding.  If you have Leber's disease, a hereditary eye condition, vitamin B12 could make it worse. RISKS AND COMPLICATIONS Reactions to an injection are   usually temporary. They might include:  Pain at the injection site.  Redness, swelling or tenderness at the site.  Headache, dizziness or weakness.  Nausea, upset stomach or diarrhea.  Numbness or tingling.  Fever.  Joint pain.  Itching or rash. If a reaction does not go away in a short while, talk with your healthcare provider. A change in the way the shots are  given, or where they are given, might need to be made. BEFORE AN INJECTION To decide whether B12 injections are right for you, your healthcare provider will probably:  Ask about your medical history.  Ask questions about your diet.  Ask about symptoms such as:  Have you felt weak?  Do you feel unusually tired?  Do you get dizzy?  Order blood tests. These may include a test to:  Check the level of red cells in your blood.  Measure B12 levels.  Check for the presence of intrinsic factor. VITAMIN B12 INJECTIONS How often you will need a vitamin B12 injection will depend on how severe your deficiency is. This also will affect how long you will need to get them. People with pernicious anemia usually get injections for their entire life. Others might get them for a shorter period. For many people, injections are given daily or weekly for several weeks. Then, once B12 levels are normal, injections are given just once a month. If the cause of the deficiency can be fixed, the injections can be stopped. Talk with your healthcare provider about what you should expect. For an injection:  The injection site will be cleaned with an alcohol swab.  Your healthcare provider will insert a needle directly into a muscle. Most any muscle can be used. Most often, an arm muscle is used. A buttocks muscle can also be used. Many people say shots in that area are less painful.  A small adhesive bandage may be put over the injection site. It usually can be taken off in an hour or less. Injections can be given by your healthcare provider. In some cases, family members give them. Sometimes, people give them to themselves. Talk with your healthcare provider about what would be best for you. If someone other than your healthcare provider will be giving the shots, the person will need to be trained to give them correctly. HOME CARE INSTRUCTIONS   You can remove the adhesive bandage within an hour of getting a  shot.  You should be able to go about your normal activities right away.  Avoid drinking large amounts of alcohol while taking vitamin B12 shots. Alcohol can interfere with the body's use of the vitamin. SEEK MEDICAL CARE IF:   Pain, redness, swelling or tenderness at the injection site does not get better or gets worse.  Headache, dizziness or weakness does not go away.  You develop a fever of more than 100.5 F (38.1 C). SEEK IMMEDIATE MEDICAL CARE IF:   You have chest pain.  You develop shortness of breath.  You have muscle weakness that gets worse.  You develop numbness, weakness or tingling on one side or one area of the body.  You have symptoms of an allergic reaction, such as:  Hives.  Difficulty breathing.  Swelling of the lips, face, tongue or throat.  You develop a fever of more than 102.0 F (38.9 C). MAKE SURE YOU:   Understand these instructions.  Will watch your condition.  Will get help right away if you are not doing well or get worse. Document   Released: 12/11/2008 Document Revised: 12/07/2011 Document Reviewed: 12/11/2008 [employer] Patient Information 2014 [employer].  

## 2013-06-08 NOTE — Progress Notes (Signed)
Patient tolerated well.

## 2013-06-13 ENCOUNTER — Other Ambulatory Visit: Payer: Self-pay | Admitting: Cardiology

## 2013-06-22 ENCOUNTER — Other Ambulatory Visit: Payer: Self-pay | Admitting: Cardiology

## 2013-06-22 ENCOUNTER — Other Ambulatory Visit: Payer: Self-pay | Admitting: Nurse Practitioner

## 2013-06-23 NOTE — Telephone Encounter (Signed)
Last lipids 11/23/12  MMM

## 2013-07-10 ENCOUNTER — Encounter (INDEPENDENT_AMBULATORY_CARE_PROVIDER_SITE_OTHER): Payer: Self-pay

## 2013-07-10 ENCOUNTER — Ambulatory Visit (INDEPENDENT_AMBULATORY_CARE_PROVIDER_SITE_OTHER): Payer: Medicare HMO | Admitting: *Deleted

## 2013-07-10 DIAGNOSIS — E538 Deficiency of other specified B group vitamins: Secondary | ICD-10-CM

## 2013-07-10 DIAGNOSIS — Z23 Encounter for immunization: Secondary | ICD-10-CM

## 2013-07-10 NOTE — Patient Instructions (Signed)
Influenza Virus Vaccine injection (Fluarix) What is this medicine? INFLUENZA VIRUS VACCINE (in floo EN zuh VAHY ruhs vak SEEN) helps to reduce the risk of getting influenza also known as the flu. This medicine may be used for other purposes; ask your health care provider or pharmacist if you have questions. What should I tell my health care provider before I take this medicine? They need to know if you have any of these conditions: -bleeding disorder like hemophilia -fever or infection -Guillain-Barre syndrome or other neurological problems -immune system problems -infection with the human immunodeficiency virus (HIV) or AIDS -low blood platelet counts -multiple sclerosis -an unusual or allergic reaction to influenza virus vaccine, eggs, chicken proteins, latex, gentamicin, other medicines, foods, dyes or preservatives -pregnant or trying to get pregnant -breast-feeding How should I use this medicine? This vaccine is for injection into a muscle. It is given by a health care professional. A copy of Vaccine Information Statements will be given before each vaccination. Read this sheet carefully each time. The sheet may change frequently. Talk to your pediatrician regarding the use of this medicine in children. Special care may be needed. Overdosage: If you think you have taken too much of this medicine contact a poison control center or emergency room at once. NOTE: This medicine is only for you. Do not share this medicine with others. What if I miss a dose? This does not apply. What may interact with this medicine? -chemotherapy or radiation therapy -medicines that lower your immune system like etanercept, anakinra, infliximab, and adalimumab -medicines that treat or prevent blood clots like warfarin -phenytoin -steroid medicines like prednisone or cortisone -theophylline -vaccines This list may not describe all possible interactions. Give your health care provider a list of all the  medicines, herbs, non-prescription drugs, or dietary supplements you use. Also tell them if you smoke, drink alcohol, or use illegal drugs. Some items may interact with your medicine. What should I watch for while using this medicine? Report any side effects that do not go away within 3 days to your doctor or health care professional. Call your health care provider if any unusual symptoms occur within 6 weeks of receiving this vaccine. You may still catch the flu, but the illness is not usually as bad. You cannot get the flu from the vaccine. The vaccine will not protect against colds or other illnesses that may cause fever. The vaccine is needed every year. What side effects may I notice from receiving this medicine? Side effects that you should report to your doctor or health care professional as soon as possible: -allergic reactions like skin rash, itching or hives, swelling of the face, lips, or tongue Side effects that usually do not require medical attention (report to your doctor or health care professional if they continue or are bothersome): -fever -headache -muscle aches and pains -pain, tenderness, redness, or swelling at site where injected -weak or tired This list may not describe all possible side effects. Call your doctor for medical advice about side effects. You may report side effects to FDA at 1-800-FDA-1088. Where should I keep my medicine? This vaccine is only given in a clinic, pharmacy, doctor's office, or other health care setting and will not be stored at home. NOTE: This sheet is a summary. It may not cover all possible information. If you have questions about this medicine, talk to your doctor, pharmacist, or health care provider.  2013, Elsevier/Gold Standard. (04/11/2008 9:30:40 AM) Cyanocobalamin, Vitamin B12 injection What is this medicine? CYANOCOBALAMIN (  sye an oh koe BAL a min) is a man made form of vitamin B12. Vitamin B12 is used in the growth of healthy blood  cells, nerve cells, and proteins in the body. It also helps with the metabolism of fats and carbohydrates. This medicine is used to treat people who can not absorb vitamin B12. This medicine may be used for other purposes; ask your health care provider or pharmacist if you have questions. What should I tell my health care provider before I take this medicine? They need to know if you have any of these conditions: -kidney disease -Leber's disease -megaloblastic anemia -an unusual or allergic reaction to cyanocobalamin, cobalt, other medicines, foods, dyes, or preservatives -pregnant or trying to get pregnant -breast-feeding How should I use this medicine? This medicine is injected into a muscle or deeply under the skin. It is usually given by a health care professional in a clinic or doctor's office. However, your doctor may teach you how to inject yourself. Follow all instructions. Talk to your pediatrician regarding the use of this medicine in children. Special care may be needed. Overdosage: If you think you have taken too much of this medicine contact a poison control center or emergency room at once. NOTE: This medicine is only for you. Do not share this medicine with others. What if I miss a dose? If you are given your dose at a clinic or doctor's office, call to reschedule your appointment. If you give your own injections and you miss a dose, take it as soon as you can. If it is almost time for your next dose, take only that dose. Do not take double or extra doses. What may interact with this medicine? -colchicine -heavy alcohol intake This list may not describe all possible interactions. Give your health care provider a list of all the medicines, herbs, non-prescription drugs, or dietary supplements you use. Also tell them if you smoke, drink alcohol, or use illegal drugs. Some items may interact with your medicine. What should I watch for while using this medicine? Visit your doctor or  health care professional regularly. You may need blood work done while you are taking this medicine. You may need to follow a special diet. Talk to your doctor. Limit your alcohol intake and avoid smoking to get the best benefit. What side effects may I notice from receiving this medicine? Side effects that you should report to your doctor or health care professional as soon as possible: -allergic reactions like skin rash, itching or hives, swelling of the face, lips, or tongue -blue tint to skin -chest tightness, pain -difficulty breathing, wheezing -dizziness -red, swollen painful area on the leg Side effects that usually do not require medical attention (report to your doctor or health care professional if they continue or are bothersome): -diarrhea -headache This list may not describe all possible side effects. Call your doctor for medical advice about side effects. You may report side effects to FDA at 1-800-FDA-1088. Where should I keep my medicine? Keep out of the reach of children. Store at room temperature between 15 and 30 degrees C (59 and 85 degrees F). Protect from light. Throw away any unused medicine after the expiration date. NOTE: This sheet is a summary. It may not cover all possible information. If you have questions about this medicine, talk to your doctor, pharmacist, or health care provider.  2012, Elsevier/Gold Standard. (12/26/2007 10:10:20 PM)

## 2013-07-10 NOTE — Progress Notes (Signed)
b12 and flu shot given pt tolerated well

## 2013-07-12 ENCOUNTER — Other Ambulatory Visit: Payer: Self-pay | Admitting: Nurse Practitioner

## 2013-08-11 ENCOUNTER — Ambulatory Visit (INDEPENDENT_AMBULATORY_CARE_PROVIDER_SITE_OTHER): Payer: Medicare HMO | Admitting: *Deleted

## 2013-08-11 ENCOUNTER — Telehealth: Payer: Self-pay

## 2013-08-11 DIAGNOSIS — E538 Deficiency of other specified B group vitamins: Secondary | ICD-10-CM

## 2013-08-11 DIAGNOSIS — F419 Anxiety disorder, unspecified: Secondary | ICD-10-CM

## 2013-08-11 MED ORDER — ALPRAZOLAM 0.25 MG PO TABS: 0.2500 mg | ORAL_TABLET | ORAL | Status: DC | PRN

## 2013-08-11 NOTE — Telephone Encounter (Signed)
Okay to refill times 3 per Dr Patty Sermons.  I spoke with the pharmacist and called in Rx.

## 2013-08-11 NOTE — Progress Notes (Signed)
Vitamin b12 injection given and tolerated well.  

## 2013-08-11 NOTE — Patient Instructions (Signed)
Vitamin B12 Injections Every person needs vitamin B12. A deficiency develops when the body does not get enough of it. One way to overcome this is by getting B12 shots (injections). A B12 shot puts the vitamin directly into muscle tissue. This avoids any problems your body might have in absorbing it from food or a pill. In some people, the body has trouble using the vitamin correctly. This can cause a B12 deficiency. Not consuming enough of the vitamin can also cause a deficiency. Getting enough vitamin B12 can be hard for elderly people. Sometimes, they do not eat a well-balanced diet. The elderly are also more likely than younger people to have medical conditions or take medications that can lead to a deficiency. WHAT DOES VITAMIN B12 DO? Vitamin B12 does many things to help the body work right:  It helps the body make healthy red blood cells.  It helps maintain nerve cells.  It is involved in the body's process of converting food into energy (metabolism).  It is needed to make the genetic material in all cells (DNA). VITAMIN B12 FOOD SOURCES Most people get plenty of vitamin B12 through the foods they eat. It is present in:  Meat, fish, poultry, and eggs.  Milk and milk products.  It also is added when certain foods are made, including some breads, cereals and yogurts. The food is then called "fortified". CAUSES The most common causes of vitamin B12 deficiency are:  Pernicious anemia. The condition develops when the body cannot make enough healthy red blood cells. This stems from a lack of a protein made in the stomach (intrinsic factor). People without this protein cannot absorb enough vitamin B12 from food.  Malabsorption. This is when the body cannot absorb the vitamin. It can be caused by:  Pernicious anemia.  Surgery to remove part or all of the stomach can lead to malabsorption. Removal of part or all of the small intestine can also cause malabsorption.  Vegetarian diet.  People who are strict about not eating foods from animals could have trouble taking in enough vitamin B12 from diet alone.  Medications. Some medicines have been linked to B12 deficiency, such as Metformin (a drug prescribed for type 2 diabetes). Long-term use of stomach acid suppressants also can keep the vitamin from being absorbed.  Intestinal problems such as inflammatory bowel disease. If there are problems in the digestive tract, vitamin B12 may not be absorbed in good enough amounts. SYMPTOMS People who do not get enough B12 can develop problems. These can include:  Anemia. This is when the body has too few red blood cells. Red blood cells carry oxygen to the rest of the body. Without a healthy supply of red blood cells, people can feel:  Tired (fatigued).  Weak.  Severe anemia can cause:  Shortness of breath.  Dizziness.  Rapid heart rate.  Paleness.  Other Vitamin B12 deficiency symptoms include:  Diarrhea.  Numbness or tingling in the hands or feet.  Loss of appetite.  Confusion.  Sores on the tongue or in the mouth. LET YOUR CAREGIVER KNOW ABOUT:  Any allergies. It is very important to know if you are allergic or sensitive to cobalt. Vitamin B12 contains cobalt.  Any history of kidney disease.  All medications you are taking. Include prescription and over-the-counter medicines, herbs and creams.  Whether you are pregnant or breast-feeding.  If you have Leber's disease, a hereditary eye condition, vitamin B12 could make it worse. RISKS AND COMPLICATIONS Reactions to an injection are   usually temporary. They might include:  Pain at the injection site.  Redness, swelling or tenderness at the site.  Headache, dizziness or weakness.  Nausea, upset stomach or diarrhea.  Numbness or tingling.  Fever.  Joint pain.  Itching or rash. If a reaction does not go away in a short while, talk with your healthcare provider. A change in the way the shots are  given, or where they are given, might need to be made. BEFORE AN INJECTION To decide whether B12 injections are right for you, your healthcare provider will probably:  Ask about your medical history.  Ask questions about your diet.  Ask about symptoms such as:  Have you felt weak?  Do you feel unusually tired?  Do you get dizzy?  Order blood tests. These may include a test to:  Check the level of red cells in your blood.  Measure B12 levels.  Check for the presence of intrinsic factor. VITAMIN B12 INJECTIONS How often you will need a vitamin B12 injection will depend on how severe your deficiency is. This also will affect how long you will need to get them. People with pernicious anemia usually get injections for their entire life. Others might get them for a shorter period. For many people, injections are given daily or weekly for several weeks. Then, once B12 levels are normal, injections are given just once a month. If the cause of the deficiency can be fixed, the injections can be stopped. Talk with your healthcare provider about what you should expect. For an injection:  The injection site will be cleaned with an alcohol swab.  Your healthcare provider will insert a needle directly into a muscle. Most any muscle can be used. Most often, an arm muscle is used. A buttocks muscle can also be used. Many people say shots in that area are less painful.  A small adhesive bandage may be put over the injection site. It usually can be taken off in an hour or less. Injections can be given by your healthcare provider. In some cases, family members give them. Sometimes, people give them to themselves. Talk with your healthcare provider about what would be best for you. If someone other than your healthcare provider will be giving the shots, the person will need to be trained to give them correctly. HOME CARE INSTRUCTIONS   You can remove the adhesive bandage within an hour of getting a  shot.  You should be able to go about your normal activities right away.  Avoid drinking large amounts of alcohol while taking vitamin B12 shots. Alcohol can interfere with the body's use of the vitamin. SEEK MEDICAL CARE IF:   Pain, redness, swelling or tenderness at the injection site does not get better or gets worse.  Headache, dizziness or weakness does not go away.  You develop a fever of more than 100.5 F (38.1 C). SEEK IMMEDIATE MEDICAL CARE IF:   You have chest pain.  You develop shortness of breath.  You have muscle weakness that gets worse.  You develop numbness, weakness or tingling on one side or one area of the body.  You have symptoms of an allergic reaction, such as:  Hives.  Difficulty breathing.  Swelling of the lips, face, tongue or throat.  You develop a fever of more than 102.0 F (38.9 C). MAKE SURE YOU:   Understand these instructions.  Will watch your condition.  Will get help right away if you are not doing well or get worse. Document   Released: 12/11/2008 Document Revised: 12/07/2011 Document Reviewed: 12/11/2008 [employer] Patient Information 2014 [employer].  

## 2013-08-11 NOTE — Addendum Note (Signed)
Addended by: Iona Coach on: 08/11/2013 05:46 PM   Modules accepted: Orders

## 2013-08-11 NOTE — Telephone Encounter (Signed)
patient called wanting a refill on her xanax 0.25 mg gave to [employer] [facility]

## 2013-09-08 ENCOUNTER — Other Ambulatory Visit: Payer: Self-pay

## 2013-09-11 ENCOUNTER — Ambulatory Visit (INDEPENDENT_AMBULATORY_CARE_PROVIDER_SITE_OTHER): Payer: Medicare HMO | Admitting: *Deleted

## 2013-09-11 DIAGNOSIS — E538 Deficiency of other specified B group vitamins: Secondary | ICD-10-CM

## 2013-09-11 NOTE — Progress Notes (Signed)
Patient ID: Valerie [name], female   DOB: 1928/01/28, 79 y.o.   MRN: [id number] Pt tolerated inj well

## 2013-09-12 ENCOUNTER — Other Ambulatory Visit: Payer: Self-pay | Admitting: Nurse Practitioner

## 2013-09-13 ENCOUNTER — Telehealth: Payer: Self-pay | Admitting: Gastroenterology

## 2013-09-13 MED ORDER — ESOMEPRAZOLE MAGNESIUM 40 MG PO CPDR: 40.0000 mg | DELAYED_RELEASE_CAPSULE | Freq: Every day | ORAL | Status: DC

## 2013-09-13 NOTE — Telephone Encounter (Signed)
RX SENT

## 2013-09-14 NOTE — Telephone Encounter (Signed)
NTBS.

## 2013-09-14 NOTE — Telephone Encounter (Signed)
Last seen 11/23/12  MMM 

## 2013-09-15 ENCOUNTER — Other Ambulatory Visit: Payer: Medicare PPO

## 2013-09-15 ENCOUNTER — Encounter: Payer: Self-pay | Admitting: Cardiology

## 2013-09-15 ENCOUNTER — Ambulatory Visit (INDEPENDENT_AMBULATORY_CARE_PROVIDER_SITE_OTHER): Payer: Medicare PPO | Admitting: Cardiology

## 2013-09-15 VITALS — BP 147/77 | HR 53 | Ht 65.0 in | Wt 123.0 lb

## 2013-09-15 DIAGNOSIS — E7800 Pure hypercholesterolemia, unspecified: Secondary | ICD-10-CM

## 2013-09-15 DIAGNOSIS — I6529 Occlusion and stenosis of unspecified carotid artery: Secondary | ICD-10-CM

## 2013-09-15 DIAGNOSIS — I119 Hypertensive heart disease without heart failure: Secondary | ICD-10-CM

## 2013-09-15 LAB — HEPATIC FUNCTION PANEL
AST: 18 U/L (ref 0–37)
Albumin: 4.3 g/dL (ref 3.5–5.2)
Alkaline Phosphatase: 76 U/L (ref 39–117)
Bilirubin, Direct: 0 mg/dL (ref 0.0–0.3)

## 2013-09-15 LAB — BASIC METABOLIC PANEL
BUN: 15 mg/dL (ref 6–23)
CO2: 27 mEq/L (ref 19–32)
Calcium: 9.4 mg/dL (ref 8.4–10.5)
Creatinine, Ser: 0.7 mg/dL (ref 0.4–1.2)

## 2013-09-15 LAB — LIPID PANEL
Cholesterol: 176 mg/dL (ref 0–200)
Total CHOL/HDL Ratio: 3
Triglycerides: 163 mg/dL — ABNORMAL HIGH (ref 0.0–149.0)

## 2013-09-15 NOTE — Assessment & Plan Note (Signed)
Patient is on pravastatin for her hypercholesterolemia.  She's not having any myalgias.  Blood work today is pending

## 2013-09-15 NOTE — Assessment & Plan Note (Signed)
The patient has not been having any symptoms of CHF.  She has occasional chest discomfort which resolved spontaneously.  She does not have to take sublingual nitroglycerin.

## 2013-09-15 NOTE — Patient Instructions (Signed)

## 2013-09-15 NOTE — Progress Notes (Signed)
Quick Note:  Please report to patient. The recent labs are stable. Continue same medication and careful diet. ______ 

## 2013-09-15 NOTE — Assessment & Plan Note (Signed)
The patient has not been having any TIA symptoms. 

## 2013-09-15 NOTE — Progress Notes (Signed)
Valerie [name] Date of Birth:  Dec 28, 1927 [address] [place], [place]  [address] [phone]         Fax   [phone]  History of Present Illness: This pleasant 79 year old woman is seen for a scheduled followup office visit.  She has a past history of carotid artery disease and a past history of essential hypertension and a history of hypercholesterolemia.  Since her last visit she's been doing fair. She's having occasional palpitations. Not had any prolonged chest pain.  The patient stays very busy.  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  She also keeps a 58 year old grandson.  She has a history of mild carotid artery disease.  Her last carotid duplex on 09/14/12 showed less than 39% stenosis bilaterally. Current Outpatient Prescriptions  Medication Sig Dispense Refill  . ALPRAZolam (XANAX) 0.25 MG tablet Take 1 tablet (0.25 mg total) by mouth as needed.  30 tablet  3  . amLODipine (NORVASC) 5 MG tablet Take 2 tablets (10 mg total) by mouth daily.  60 tablet  06  . aspirin 81 MG tablet Take 81 mg by mouth daily.        . cyanocobalamin (,VITAMIN B-12,) 1000 MCG/ML injection Please give injection into muscle weekly for 2 weeks and then begin monthly injections for one year.  1 mL  0  . diltiazem (CARDIZEM CD) 120 MG 24 hr capsule Take 1 capsule (120 mg total) by mouth daily.  30 capsule  5  . esomeprazole (NEXIUM) 40 MG capsule Take 1 capsule (40 mg total) by mouth daily before breakfast.  30 capsule  0  . hydrochlorothiazide (HYDRODIURIL) 25 MG tablet TAKE ONE HALF TABLET BY MOUTH EVERY DAY  30 tablet  3  . latanoprost (XALATAN) 0.005 % ophthalmic solution Place 1 drop into both eyes Twice daily.      . metoprolol (LOPRESSOR) 50 MG tablet TAKE 1 TABLET (50 MG TOTAL) BY MOUTH 3 (THREE) TIMES DAILY.  90 tablet  2  . pravastatin (PRAVACHOL) 40 MG tablet TAKE ONE TABLET BY MOUTH ONE TIME DAILY  30 tablet  3  . timolol (TIMOPTIC) 0.25 % ophthalmic solution as directed.       . [DISCONTINUED] lisinopril (PRINIVIL,ZESTRIL) 20 MG tablet Take 1 tablet by mouth 1 day or 1 dose.       Current Facility-Administered Medications  Medication Dose Route Frequency Provider Last Rate Last Dose  . cyanocobalamin ((VITAMIN B-12)) injection 1,000 mcg  1,000 mcg Intramuscular Q30 days Coralie Keens, FNP   1,000 mcg at 09/11/13 1308    Allergies  Allergen Reactions  . Sulfa Antibiotics Nausea Only  . Lisinopril     Hair loss    Patient Active Problem List   Diagnosis Date Noted  . Occlusion and stenosis of carotid artery without mention of cerebral infarction 09/14/2012  . Malaise and fatigue 05/12/2012  . Internal and external hemorrhoids without complication 10/05/2011  . Abdominal pain 09/15/2011  . Vitamin B12 deficiency 09/15/2011  . Abnormal weight loss 09/15/2011  . Benign hypertensive heart disease without heart failure 05/22/2011  . Pure hypercholesterolemia 05/22/2011  . Carotid stenosis, asymptomatic 05/22/2011  . Dysphagia, unspecified 05/18/2011  . Stricture esophagus 05/18/2011  . Stricture of duodenum 05/18/2011  . Dysphagia 05/15/2011  . Family history of malignant neoplasm of gastrointestinal tract 05/15/2011  . Iron deficiency anemia, unspecified  05/15/2011  . Other general symptoms  05/15/2011  . GERD (gastroesophageal reflux disease) 05/15/2011  . BRBPR (  bright red blood per rectum) 05/15/2011  . GERD 02/03/2008  . DIVERTICULOSIS-COLON 02/03/2008  . HEMOCCULT POSITIVE STOOL 02/03/2008    History  Smoking status  . Never Smoker   Smokeless tobacco  . Never Used    History  Alcohol Use No    Family History  Problem Relation Age of Onset  . Leukemia Mother   . Colon cancer Father 25  . Kidney cancer Brother   . Cancer Brother     bladder  . Stroke Sister   . Esophageal cancer Neg Hx   . Stomach cancer Neg Hx     Review of Systems: Constitutional: no fever chills diaphoresis or fatigue or change in weight.  Head and  neck: no hearing loss, no epistaxis, no photophobia or visual disturbance. Respiratory: No cough, shortness of breath or wheezing. Cardiovascular: No chest pain peripheral edema, palpitations. Gastrointestinal: No abdominal distention, no abdominal pain, no change in bowel habits hematochezia or melena. Genitourinary: No dysuria, no frequency, no urgency, no nocturia. Musculoskeletal:No arthralgias, no back pain, no gait disturbance or myalgias. Neurological: No dizziness, no headaches, no numbness, no seizures, no syncope, no weakness, no tremors. Hematologic: No lymphadenopathy, no easy bruising. Psychiatric: No confusion, no hallucinations, no sleep disturbance.    Physical Exam: Filed Vitals:   09/15/13 1040  BP: 147/77  Pulse: 53     Assessment / Plan: The general appearance reveals a well-developed well-nourished woman in no distress.  Blood pressure is slightly high today.The head and neck exam reveals pupils equal and reactive.  Extraocular movements are full.  There is no scleral icterus.  The mouth and pharynx are normal.  The neck is supple.  The carotids reveal no bruits.  The jugular venous pressure is normal.  The  thyroid is not enlarged.  There is no lymphadenopathy.  The chest is clear to percussion and auscultation.  There are no rales or rhonchi.  Expansion of the chest is symmetrical.  The precordium is quiet.  The first heart sound is normal.  The second heart sound is physiologically split.  There is no murmur gallop rub or click.  There is no abnormal lift or heave.  The abdomen is soft and nontender.  The bowel sounds are normal.  The liver and spleen are not enlarged.  There are no abdominal masses.  There are no abdominal bruits.  Extremities reveal good pedal pulses.  There is no phlebitis or edema.  There is no cyanosis or clubbing.  Strength is normal and symmetrical in all extremities.  There is no lateralizing weakness.  There are no sensory deficits.  The skin is  warm and dry.  There is no rash.    EKG today shows sinus bradycardia and no ischemic changes.  Plan: The patient is doing well.  Continue same medication.  Recheck in 4 months for followup office visit lipid panel hepatic function panel and basal metabolic panel.

## 2013-09-18 ENCOUNTER — Telehealth: Payer: Self-pay | Admitting: *Deleted

## 2013-09-18 ENCOUNTER — Other Ambulatory Visit: Payer: Self-pay

## 2013-09-18 MED ORDER — AMLODIPINE BESYLATE 5 MG PO TABS: 10.0000 mg | ORAL_TABLET | Freq: Every day | ORAL | Status: DC

## 2013-09-18 NOTE — Telephone Encounter (Signed)
NTBS.

## 2013-09-18 NOTE — Telephone Encounter (Signed)
Last seen 11/23/12  MMM

## 2013-09-18 NOTE — Telephone Encounter (Signed)
Advised patient of lab results  

## 2013-09-18 NOTE — Telephone Encounter (Signed)
Message copied by Burnell Blanks on Mon Sep 18, 2013  5:13 PM ------      Message from: Cassell Clement      Created: Fri Sep 15, 2013  9:04 PM       Please report to patient.  The recent labs are stable. Continue same medication and careful diet. ------

## 2013-10-13 ENCOUNTER — Ambulatory Visit (INDEPENDENT_AMBULATORY_CARE_PROVIDER_SITE_OTHER): Payer: Medicare HMO | Admitting: *Deleted

## 2013-10-13 DIAGNOSIS — E538 Deficiency of other specified B group vitamins: Secondary | ICD-10-CM

## 2013-10-13 NOTE — Progress Notes (Signed)
Patient given vitamin b12 injection and tolerated well.  

## 2013-10-13 NOTE — Patient Instructions (Signed)
Vitamin B12 Injections Every person needs vitamin B12. A deficiency develops when the body does not get enough of it. One way to overcome this is by getting B12 shots (injections). A B12 shot puts the vitamin directly into muscle tissue. This avoids any problems your body might have in absorbing it from food or a pill. In some people, the body has trouble using the vitamin correctly. This can cause a B12 deficiency. Not consuming enough of the vitamin can also cause a deficiency. Getting enough vitamin B12 can be hard for elderly people. Sometimes, they do not eat a well-balanced diet. The elderly are also more likely than younger people to have medical conditions or take medications that can lead to a deficiency. WHAT DOES VITAMIN B12 DO? Vitamin B12 does many things to help the body work right:  It helps the body make healthy red blood cells.  It helps maintain nerve cells.  It is involved in the body's process of converting food into energy (metabolism).  It is needed to make the genetic material in all cells (DNA). VITAMIN B12 FOOD SOURCES Most people get plenty of vitamin B12 through the foods they eat. It is present in:  Meat, fish, poultry, and eggs.  Milk and milk products.  It also is added when certain foods are made, including some breads, cereals and yogurts. The food is then called "fortified". CAUSES The most common causes of vitamin B12 deficiency are:  Pernicious anemia. The condition develops when the body cannot make enough healthy red blood cells. This stems from a lack of a protein made in the stomach (intrinsic factor). People without this protein cannot absorb enough vitamin B12 from food.  Malabsorption. This is when the body cannot absorb the vitamin. It can be caused by:  Pernicious anemia.  Surgery to remove part or all of the stomach can lead to malabsorption. Removal of part or all of the small intestine can also cause malabsorption.  Vegetarian diet.  People who are strict about not eating foods from animals could have trouble taking in enough vitamin B12 from diet alone.  Medications. Some medicines have been linked to B12 deficiency, such as Metformin (a drug prescribed for type 2 diabetes). Long-term use of stomach acid suppressants also can keep the vitamin from being absorbed.  Intestinal problems such as inflammatory bowel disease. If there are problems in the digestive tract, vitamin B12 may not be absorbed in good enough amounts. SYMPTOMS People who do not get enough B12 can develop problems. These can include:  Anemia. This is when the body has too few red blood cells. Red blood cells carry oxygen to the rest of the body. Without a healthy supply of red blood cells, people can feel:  Tired (fatigued).  Weak.  Severe anemia can cause:  Shortness of breath.  Dizziness.  Rapid heart rate.  Paleness.  Other Vitamin B12 deficiency symptoms include:  Diarrhea.  Numbness or tingling in the hands or feet.  Loss of appetite.  Confusion.  Sores on the tongue or in the mouth. LET YOUR CAREGIVER KNOW ABOUT:  Any allergies. It is very important to know if you are allergic or sensitive to cobalt. Vitamin B12 contains cobalt.  Any history of kidney disease.  All medications you are taking. Include prescription and over-the-counter medicines, herbs and creams.  Whether you are pregnant or breast-feeding.  If you have Leber's disease, a hereditary eye condition, vitamin B12 could make it worse. RISKS AND COMPLICATIONS Reactions to an injection are   usually temporary. They might include:  Pain at the injection site.  Redness, swelling or tenderness at the site.  Headache, dizziness or weakness.  Nausea, upset stomach or diarrhea.  Numbness or tingling.  Fever.  Joint pain.  Itching or rash. If a reaction does not go away in a short while, talk with your healthcare provider. A change in the way the shots are  given, or where they are given, might need to be made. BEFORE AN INJECTION To decide whether B12 injections are right for you, your healthcare provider will probably:  Ask about your medical history.  Ask questions about your diet.  Ask about symptoms such as:  Have you felt weak?  Do you feel unusually tired?  Do you get dizzy?  Order blood tests. These may include a test to:  Check the level of red cells in your blood.  Measure B12 levels.  Check for the presence of intrinsic factor. VITAMIN B12 INJECTIONS How often you will need a vitamin B12 injection will depend on how severe your deficiency is. This also will affect how long you will need to get them. People with pernicious anemia usually get injections for their entire life. Others might get them for a shorter period. For many people, injections are given daily or weekly for several weeks. Then, once B12 levels are normal, injections are given just once a month. If the cause of the deficiency can be fixed, the injections can be stopped. Talk with your healthcare provider about what you should expect. For an injection:  The injection site will be cleaned with an alcohol swab.  Your healthcare provider will insert a needle directly into a muscle. Most any muscle can be used. Most often, an arm muscle is used. A buttocks muscle can also be used. Many people say shots in that area are less painful.  A small adhesive bandage may be put over the injection site. It usually can be taken off in an hour or less. Injections can be given by your healthcare provider. In some cases, family members give them. Sometimes, people give them to themselves. Talk with your healthcare provider about what would be best for you. If someone other than your healthcare provider will be giving the shots, the person will need to be trained to give them correctly. HOME CARE INSTRUCTIONS   You can remove the adhesive bandage within an hour of getting a  shot.  You should be able to go about your normal activities right away.  Avoid drinking large amounts of alcohol while taking vitamin B12 shots. Alcohol can interfere with the body's use of the vitamin. SEEK MEDICAL CARE IF:   Pain, redness, swelling or tenderness at the injection site does not get better or gets worse.  Headache, dizziness or weakness does not go away.  You develop a fever of more than 100.5 F (38.1 C). SEEK IMMEDIATE MEDICAL CARE IF:   You have chest pain.  You develop shortness of breath.  You have muscle weakness that gets worse.  You develop numbness, weakness or tingling on one side or one area of the body.  You have symptoms of an allergic reaction, such as:  Hives.  Difficulty breathing.  Swelling of the lips, face, tongue or throat.  You develop a fever of more than 102.0 F (38.9 C). MAKE SURE YOU:   Understand these instructions.  Will watch your condition.  Will get help right away if you are not doing well or get worse. Document   Released: 12/11/2008 Document Revised: 12/07/2011 Document Reviewed: 12/11/2008 [employer] Patient Information 2014 [employer].  

## 2013-10-25 ENCOUNTER — Ambulatory Visit (INDEPENDENT_AMBULATORY_CARE_PROVIDER_SITE_OTHER): Payer: Medicare HMO | Admitting: Nurse Practitioner

## 2013-10-25 ENCOUNTER — Encounter: Payer: Self-pay | Admitting: Nurse Practitioner

## 2013-10-25 VITALS — BP 141/62 | HR 56 | Temp 97.5°F | Ht 65.0 in | Wt 120.0 lb

## 2013-10-25 DIAGNOSIS — R300 Dysuria: Secondary | ICD-10-CM

## 2013-10-25 DIAGNOSIS — N390 Urinary tract infection, site not specified: Secondary | ICD-10-CM

## 2013-10-25 LAB — POCT URINALYSIS DIPSTICK
BILIRUBIN UA: NEGATIVE
GLUCOSE UA: NEGATIVE
KETONES UA: NEGATIVE
NITRITE UA: NEGATIVE
Spec Grav, UA: 1.025
Urobilinogen, UA: NEGATIVE
pH, UA: 5

## 2013-10-25 LAB — POCT UA - MICROSCOPIC ONLY
Casts, Ur, LPF, POC: NEGATIVE
Crystals, Ur, HPF, POC: NEGATIVE
Yeast, UA: NEGATIVE

## 2013-10-25 MED ORDER — CIPROFLOXACIN HCL 500 MG PO TABS: 500.0000 mg | ORAL_TABLET | Freq: Two times a day (BID) | ORAL | Status: DC

## 2013-10-25 NOTE — Progress Notes (Signed)
   Subjective:    Patient ID: Valerie [name], female    DOB: 05/28/1928, 80 y.o.   MRN: [id number]  HPI Patient in c/o dysuria and nocturia that started 2 nights ago- slight suprapubic pain    Review of Systems  Respiratory: Negative.   Cardiovascular: Negative.   Genitourinary: Positive for dysuria, urgency and frequency.  All other systems reviewed and are negative.       Objective:   Physical Exam  Constitutional: She appears well-developed and well-nourished.  Cardiovascular: Normal rate, regular rhythm and normal heart sounds.   Pulmonary/Chest: Effort normal and breath sounds normal.  Abdominal: There is tenderness (mild suprapubic pain).  Skin: Skin is warm and dry.    BP 141/62  Pulse 56  Temp(Src) 97.5 F (36.4 C) (Oral)  Ht 5\' 5"  (1.651 m)  Wt 120 lb (54.432 kg)  BMI 19.97 kg/m2 Results for orders placed in visit on 10/25/13  POCT URINALYSIS DIPSTICK      Result Value Range   Color, UA AMBER     Clarity, UA CLEAR     Glucose, UA NEG     Bilirubin, UA NEG     Ketones, UA NEG     Spec Grav, UA 1.025     Blood, UA MOD     pH, UA 5.0     Protein, UA 4+     Urobilinogen, UA negative     Nitrite, UA NEG     Leukocytes, UA Trace    POCT UA - MICROSCOPIC ONLY      Result Value Range   WBC, Ur, HPF, POC 5-10     RBC, urine, microscopic 5-10     Bacteria, U Microscopic MANY     Mucus, UA FEW     Epithelial cells, urine per micros FEW     Crystals, Ur, HPF, POC NEG     Casts, Ur, LPF, POC NEG     Yeast, UA NEG           Assessment & Plan:   1. Dysuria   2. UTI (urinary tract infection)    Meds ordered this encounter  Medications  . ciprofloxacin (CIPRO) 500 MG tablet    Sig: Take 1 tablet (500 mg total) by mouth 2 (two) times daily.    Dispense:  6 tablet    Refill:  0    Order Specific Question:  Supervising Provider    Answer:  Chipper Herb [[id number]]   Force fluids AZO over the counter X2 days RTO prn Culture pending Mary-Margaret Hassell Done,  FNP

## 2013-10-25 NOTE — Patient Instructions (Signed)
Urinary Tract Infection  Urinary tract infections (UTIs) can develop anywhere along your urinary tract. Your urinary tract is your body's drainage system for removing wastes and extra water. Your urinary tract includes two kidneys, two ureters, a bladder, and a urethra. Your kidneys are a pair of bean-shaped organs. Each kidney is about the size of your fist. They are located below your ribs, one on each side of your spine.  CAUSES  Infections are caused by microbes, which are microscopic organisms, including fungi, viruses, and bacteria. These organisms are so small that they can only be seen through a microscope. Bacteria are the microbes that most commonly cause UTIs.  SYMPTOMS   Symptoms of UTIs may vary by age and gender of the patient and by the location of the infection. Symptoms in young women typically include a frequent and intense urge to urinate and a painful, burning feeling in the bladder or urethra during urination. Older women and men are more likely to be tired, shaky, and weak and have muscle aches and abdominal pain. A fever may mean the infection is in your kidneys. Other symptoms of a kidney infection include pain in your back or sides below the ribs, nausea, and vomiting.  DIAGNOSIS  To diagnose a UTI, your caregiver will ask you about your symptoms. Your caregiver also will ask to provide a urine sample. The urine sample will be tested for bacteria and white blood cells. White blood cells are made by your body to help fight infection.  TREATMENT   Typically, UTIs can be treated with medication. Because most UTIs are caused by a bacterial infection, they usually can be treated with the use of antibiotics. The choice of antibiotic and length of treatment depend on your symptoms and the type of bacteria causing your infection.  HOME CARE INSTRUCTIONS   If you were prescribed antibiotics, take them exactly as your caregiver instructs you. Finish the medication even if you feel better after you  have only taken some of the medication.   Drink enough water and fluids to keep your urine clear or pale yellow.   Avoid caffeine, tea, and carbonated beverages. They tend to irritate your bladder.   Empty your bladder often. Avoid holding urine for long periods of time.   Empty your bladder before and after sexual intercourse.   After a bowel movement, women should cleanse from front to back. Use each tissue only once.  SEEK MEDICAL CARE IF:    You have back pain.   You develop a fever.   Your symptoms do not begin to resolve within 3 days.  SEEK IMMEDIATE MEDICAL CARE IF:    You have severe back pain or lower abdominal pain.   You develop chills.   You have nausea or vomiting.   You have continued burning or discomfort with urination.  MAKE SURE YOU:    Understand these instructions.   Will watch your condition.   Will get help right away if you are not doing well or get worse.  Document Released: 06/24/2005 Document Revised: 03/15/2012 Document Reviewed: 10/23/2011  Exit[employer] Patient Information 2014 [employer].

## 2013-10-28 ENCOUNTER — Other Ambulatory Visit: Payer: Self-pay | Admitting: Cardiology

## 2013-10-28 ENCOUNTER — Other Ambulatory Visit: Payer: Self-pay | Admitting: Nurse Practitioner

## 2013-11-06 ENCOUNTER — Telehealth: Payer: Self-pay | Admitting: Nurse Practitioner

## 2013-11-06 NOTE — Telephone Encounter (Signed)
Will need to be seen

## 2013-11-06 NOTE — Telephone Encounter (Signed)
To call back and make an appointment per mmm

## 2013-11-08 ENCOUNTER — Ambulatory Visit (INDEPENDENT_AMBULATORY_CARE_PROVIDER_SITE_OTHER): Payer: Medicare HMO | Admitting: Family Medicine

## 2013-11-08 DIAGNOSIS — N390 Urinary tract infection, site not specified: Secondary | ICD-10-CM

## 2013-11-08 LAB — POCT URINALYSIS DIPSTICK
Bilirubin, UA: NEGATIVE
Glucose, UA: NEGATIVE
Ketones, UA: NEGATIVE
Nitrite, UA: NEGATIVE
Spec Grav, UA: 1.02
Urobilinogen, UA: NEGATIVE
pH, UA: 6

## 2013-11-08 LAB — POCT UA - MICROSCOPIC ONLY
Casts, Ur, LPF, POC: NEGATIVE
Crystals, Ur, HPF, POC: NEGATIVE
Mucus, UA: NEGATIVE
Yeast, UA: NEGATIVE

## 2013-11-08 NOTE — Progress Notes (Signed)
   Subjective:    Patient ID: Valerie [name], female    DOB: 1928-01-06, 80 y.o.   MRN: [id number]  HPI  This 80 y.o. female presents for evaluation of UTI.  She was recently tx for uti and she states it is not better.  Review of Systems No chest pain, SOB, HA, dizziness, vision change, N/V, diarrhea, constipation, dysuria, urinary urgency or frequency, myalgias, arthralgias or rash.     Objective:   Physical Exam  Vital signs noted  Well developed well nourished female.  HEENT - Head atraumatic Normocephalic Respiratory - Lungs CTA bilateral Cardiac - RRR S1 and S2 without murmur GI - Abdomen soft Nontender and bowel sounds active x 4 Extremities - No edema. Neuro - Grossly intact.      Assessment & Plan:  Urinary tract infection, site not specified - Plan: POCT urinalysis dipstick, POCT UA - Microscopic Only, Urine culture Macrobid 100mg  one po bid x 10 days #20  Push po fluids, rest, tylenol and motrin otc prn as directed for fever, arthralgias, and myalgias.  Follow up prn if sx's continue or persist.  Lysbeth Penner FNP

## 2013-11-09 LAB — URINE CULTURE

## 2013-11-21 ENCOUNTER — Other Ambulatory Visit: Payer: Medicare PPO

## 2013-11-26 DIAGNOSIS — N390 Urinary tract infection, site not specified: Secondary | ICD-10-CM

## 2013-11-26 HISTORY — DX: Urinary tract infection, site not specified: N39.0

## 2013-11-27 ENCOUNTER — Ambulatory Visit (INDEPENDENT_AMBULATORY_CARE_PROVIDER_SITE_OTHER): Payer: Medicare HMO | Admitting: *Deleted

## 2013-11-27 DIAGNOSIS — E538 Deficiency of other specified B group vitamins: Secondary | ICD-10-CM

## 2013-11-27 NOTE — Progress Notes (Signed)
Vitamin b12 given and tolerated well. 

## 2013-11-27 NOTE — Patient Instructions (Signed)
Vitamin B12 Injections Every person needs vitamin B12. A deficiency develops when the body does not get enough of it. One way to overcome this is by getting B12 shots (injections). A B12 shot puts the vitamin directly into muscle tissue. This avoids any problems your body might have in absorbing it from food or a pill. In some people, the body has trouble using the vitamin correctly. This can cause a B12 deficiency. Not consuming enough of the vitamin can also cause a deficiency. Getting enough vitamin B12 can be hard for elderly people. Sometimes, they do not eat a well-balanced diet. The elderly are also more likely than younger people to have medical conditions or take medications that can lead to a deficiency. WHAT DOES VITAMIN B12 DO? Vitamin B12 does many things to help the body work right:  It helps the body make healthy red blood cells.  It helps maintain nerve cells.  It is involved in the body's process of converting food into energy (metabolism).  It is needed to make the genetic material in all cells (DNA). VITAMIN B12 FOOD SOURCES Most people get plenty of vitamin B12 through the foods they eat. It is present in:  Meat, fish, poultry, and eggs.  Milk and milk products.  It also is added when certain foods are made, including some breads, cereals and yogurts. The food is then called "fortified". CAUSES The most common causes of vitamin B12 deficiency are:  Pernicious anemia. The condition develops when the body cannot make enough healthy red blood cells. This stems from a lack of a protein made in the stomach (intrinsic factor). People without this protein cannot absorb enough vitamin B12 from food.  Malabsorption. This is when the body cannot absorb the vitamin. It can be caused by:  Pernicious anemia.  Surgery to remove part or all of the stomach can lead to malabsorption. Removal of part or all of the small intestine can also cause malabsorption.  Vegetarian diet.  People who are strict about not eating foods from animals could have trouble taking in enough vitamin B12 from diet alone.  Medications. Some medicines have been linked to B12 deficiency, such as Metformin (a drug prescribed for type 2 diabetes). Long-term use of stomach acid suppressants also can keep the vitamin from being absorbed.  Intestinal problems such as inflammatory bowel disease. If there are problems in the digestive tract, vitamin B12 may not be absorbed in good enough amounts. SYMPTOMS People who do not get enough B12 can develop problems. These can include:  Anemia. This is when the body has too few red blood cells. Red blood cells carry oxygen to the rest of the body. Without a healthy supply of red blood cells, people can feel:  Tired (fatigued).  Weak.  Severe anemia can cause:  Shortness of breath.  Dizziness.  Rapid heart rate.  Paleness.  Other Vitamin B12 deficiency symptoms include:  Diarrhea.  Numbness or tingling in the hands or feet.  Loss of appetite.  Confusion.  Sores on the tongue or in the mouth. LET YOUR CAREGIVER KNOW ABOUT:  Any allergies. It is very important to know if you are allergic or sensitive to cobalt. Vitamin B12 contains cobalt.  Any history of kidney disease.  All medications you are taking. Include prescription and over-the-counter medicines, herbs and creams.  Whether you are pregnant or breast-feeding.  If you have Leber's disease, a hereditary eye condition, vitamin B12 could make it worse. RISKS AND COMPLICATIONS Reactions to an injection are   usually temporary. They might include:  Pain at the injection site.  Redness, swelling or tenderness at the site.  Headache, dizziness or weakness.  Nausea, upset stomach or diarrhea.  Numbness or tingling.  Fever.  Joint pain.  Itching or rash. If a reaction does not go away in a short while, talk with your healthcare provider. A change in the way the shots are  given, or where they are given, might need to be made. BEFORE AN INJECTION To decide whether B12 injections are right for you, your healthcare provider will probably:  Ask about your medical history.  Ask questions about your diet.  Ask about symptoms such as:  Have you felt weak?  Do you feel unusually tired?  Do you get dizzy?  Order blood tests. These may include a test to:  Check the level of red cells in your blood.  Measure B12 levels.  Check for the presence of intrinsic factor. VITAMIN B12 INJECTIONS How often you will need a vitamin B12 injection will depend on how severe your deficiency is. This also will affect how long you will need to get them. People with pernicious anemia usually get injections for their entire life. Others might get them for a shorter period. For many people, injections are given daily or weekly for several weeks. Then, once B12 levels are normal, injections are given just once a month. If the cause of the deficiency can be fixed, the injections can be stopped. Talk with your healthcare provider about what you should expect. For an injection:  The injection site will be cleaned with an alcohol swab.  Your healthcare provider will insert a needle directly into a muscle. Most any muscle can be used. Most often, an arm muscle is used. A buttocks muscle can also be used. Many people say shots in that area are less painful.  A small adhesive bandage may be put over the injection site. It usually can be taken off in an hour or less. Injections can be given by your healthcare provider. In some cases, family members give them. Sometimes, people give them to themselves. Talk with your healthcare provider about what would be best for you. If someone other than your healthcare provider will be giving the shots, the person will need to be trained to give them correctly. HOME CARE INSTRUCTIONS   You can remove the adhesive bandage within an hour of getting a  shot.  You should be able to go about your normal activities right away.  Avoid drinking large amounts of alcohol while taking vitamin B12 shots. Alcohol can interfere with the body's use of the vitamin. SEEK MEDICAL CARE IF:   Pain, redness, swelling or tenderness at the injection site does not get better or gets worse.  Headache, dizziness or weakness does not go away.  You develop a fever of more than 100.5 F (38.1 C). SEEK IMMEDIATE MEDICAL CARE IF:   You have chest pain.  You develop shortness of breath.  You have muscle weakness that gets worse.  You develop numbness, weakness or tingling on one side or one area of the body.  You have symptoms of an allergic reaction, such as:  Hives.  Difficulty breathing.  Swelling of the lips, face, tongue or throat.  You develop a fever of more than 102.0 F (38.9 C). MAKE SURE YOU:   Understand these instructions.  Will watch your condition.  Will get help right away if you are not doing well or get worse. Document   Released: 12/11/2008 Document Revised: 12/07/2011 Document Reviewed: 12/11/2008 [employer] Patient Information 2014 [employer].  

## 2013-12-04 ENCOUNTER — Telehealth: Payer: Self-pay | Admitting: Cardiology

## 2013-12-04 MED ORDER — POTASSIUM CHLORIDE CRYS ER 20 MEQ PO TBCR: 20.0000 meq | EXTENDED_RELEASE_TABLET | Freq: Every day | ORAL | Status: DC

## 2013-12-04 MED ORDER — AMLODIPINE BESYLATE 5 MG PO TABS: 5.0000 mg | ORAL_TABLET | Freq: Every day | ORAL | Status: DC

## 2013-12-04 NOTE — Telephone Encounter (Signed)
Left message to call back  

## 2013-12-04 NOTE — Telephone Encounter (Signed)
°  Patient is returning your call, please call back.  °

## 2013-12-04 NOTE — Telephone Encounter (Signed)
New message  Patient is taking Lasix and she is having increased swelling in her feet. She wants to know if she should take more the medication or come in and see Dr Mare Ferrari? Please call and advise.

## 2013-12-04 NOTE — Telephone Encounter (Signed)
Swelling in feet, warm to touch, bumps, red and itch. On both feet. Has had a couple of blisters that will drain with clear drainage. Swelling does go down some at night. Has a stinging like pain at times. Patient take HCTZ 25 mg 1/2 tablet daily. Will forward to  Dr. Mare Ferrari for review.

## 2013-12-04 NOTE — Telephone Encounter (Signed)
Decrease amlodipine to 5 mg daily to help with edema.  Increase hydrochlorothiazide up to 25 mg daily.  Add K. Dur 20 mEq one daily since potassium has been borderline low.

## 2013-12-04 NOTE — Telephone Encounter (Signed)
Advised patient of changes. She will call back if not better

## 2014-01-05 ENCOUNTER — Ambulatory Visit: Payer: Medicare HMO

## 2014-01-08 ENCOUNTER — Ambulatory Visit (INDEPENDENT_AMBULATORY_CARE_PROVIDER_SITE_OTHER): Payer: Medicare HMO

## 2014-01-08 DIAGNOSIS — E538 Deficiency of other specified B group vitamins: Secondary | ICD-10-CM

## 2014-01-10 ENCOUNTER — Other Ambulatory Visit: Payer: Self-pay | Admitting: Cardiology

## 2014-01-22 ENCOUNTER — Encounter: Payer: Self-pay | Admitting: Cardiology

## 2014-01-22 ENCOUNTER — Other Ambulatory Visit: Payer: Medicare PPO

## 2014-01-22 ENCOUNTER — Ambulatory Visit (INDEPENDENT_AMBULATORY_CARE_PROVIDER_SITE_OTHER): Payer: Medicare PPO | Admitting: Cardiology

## 2014-01-22 VITALS — BP 118/68 | HR 61 | Ht 65.0 in | Wt 120.0 lb

## 2014-01-22 DIAGNOSIS — R079 Chest pain, unspecified: Secondary | ICD-10-CM

## 2014-01-22 DIAGNOSIS — E7800 Pure hypercholesterolemia, unspecified: Secondary | ICD-10-CM

## 2014-01-22 DIAGNOSIS — R0989 Other specified symptoms and signs involving the circulatory and respiratory systems: Secondary | ICD-10-CM

## 2014-01-22 DIAGNOSIS — R0789 Other chest pain: Secondary | ICD-10-CM | POA: Insufficient documentation

## 2014-01-22 DIAGNOSIS — I119 Hypertensive heart disease without heart failure: Secondary | ICD-10-CM

## 2014-01-22 DIAGNOSIS — I6529 Occlusion and stenosis of unspecified carotid artery: Secondary | ICD-10-CM

## 2014-01-22 LAB — BASIC METABOLIC PANEL
BUN: 15 mg/dL (ref 6–23)
CO2: 28 mEq/L (ref 19–32)
Calcium: 9.9 mg/dL (ref 8.4–10.5)
Chloride: 104 mEq/L (ref 96–112)
Creatinine, Ser: 0.7 mg/dL (ref 0.4–1.2)
GFR: 80.71 mL/min (ref >60.00)
Glucose, Bld: 84 mg/dL (ref 70–99)
Potassium: 3.3 mEq/L — ABNORMAL LOW (ref 3.5–5.1)
SODIUM: 142 meq/L (ref 135–145)

## 2014-01-22 LAB — HEPATIC FUNCTION PANEL
ALT: 11 U/L (ref 0–35)
AST: 15 U/L (ref 0–37)
Albumin: 4.1 g/dL (ref 3.5–5.2)
Alkaline Phosphatase: 70 U/L (ref 39–117)
BILIRUBIN TOTAL: 0.6 mg/dL (ref 0.3–1.2)
Bilirubin, Direct: 0.1 mg/dL (ref 0.0–0.3)
TOTAL PROTEIN: 7.4 g/dL (ref 6.0–8.3)

## 2014-01-22 LAB — LIPID PANEL
CHOLESTEROL: 158 mg/dL (ref 0–200)
HDL: 55.5 mg/dL (ref >39.00)
LDL Cholesterol: 83 mg/dL (ref 0–99)
TRIGLYCERIDES: 98 mg/dL (ref 0.0–149.0)
Total CHOL/HDL Ratio: 3
VLDL: 19.6 mg/dL (ref 0.0–40.0)

## 2014-01-22 MED ORDER — NITROGLYCERIN 0.4 MG SL SUBL: 0.4000 mg | SUBLINGUAL_TABLET | SUBLINGUAL | Status: DC | PRN

## 2014-01-22 NOTE — Patient Instructions (Signed)
Your physician has requested that you have a lexiscan myoview. For further information please visit [email or web address]. Please follow instruction sheet, as given.  Use your NTG under your tongue for recurrent chest pain. May take one tablet every 5 minutes. If you are still having discomfort after 3 tablets in 15 minutes, call 911. RX HAS BEEN SENT TO [facility]  Your physician wants you to follow-up in: 4 months with fasting labs (lp/bmet/hfp)  You will receive a reminder letter in the mail two months in advance. If you don't receive a letter, please call our office to schedule the follow-up appointment.

## 2014-01-22 NOTE — Assessment & Plan Note (Signed)
Blood pressure has been generally stable.  No dizziness or syncope.  She still notes occasional fluttering of her heart.

## 2014-01-22 NOTE — Progress Notes (Signed)
Valerie [name] Date of Birth:  21-Jul-1928 [address] [place], [place]  [address] [phone]         Fax   [phone]  History of Present Illness: This pleasant 80 year old woman is seen for a scheduled followup office visit.  She has a past history of carotid artery disease and a past history of essential hypertension and a history of hypercholesterolemia..  Her last carotid duplex on 09/14/12 showed less than 39% stenosis bilaterally.  Since last visit she has not felt as well.  She has been experiencing some heaviness in her chest.  She has lack of energy.  She notes occasional fluttering of her heart.  She recently cut her left leg while cutting shrubbery and requires 13 sutures to her leg.  She has a history of a left carotid bruit and is followed by Dr. Gae Gallop.  Current Outpatient Prescriptions  Medication Sig Dispense Refill  . ALPRAZolam (XANAX) 0.25 MG tablet Take 1 tablet (0.25 mg total) by mouth as needed.  30 tablet  3  . amLODipine (NORVASC) 5 MG tablet Take 1 tablet (5 mg total) by mouth daily.  30 tablet  5  . aspirin 81 MG tablet Take 81 mg by mouth daily.        . ciprofloxacin (CIPRO) 500 MG tablet Take 1 tablet (500 mg total) by mouth 2 (two) times daily.  6 tablet  0  . cyanocobalamin (,VITAMIN B-12,) 1000 MCG/ML injection Please give injection into muscle weekly for 2 weeks and then begin monthly injections for one year.  1 mL  0  . diltiazem (CARDIZEM CD) 120 MG 24 hr capsule TAKE 1 CAPSULE (120 MG TOTAL) BY MOUTH DAILY.  30 capsule  1  . esomeprazole (NEXIUM) 40 MG capsule Take 1 capsule (40 mg total) by mouth daily before breakfast.  30 capsule  0  . hydrochlorothiazide (HYDRODIURIL) 25 MG tablet one tablet daily      . latanoprost (XALATAN) 0.005 % ophthalmic solution Place 1 drop into both eyes Twice daily.      . metoprolol (LOPRESSOR) 50 MG tablet TAKE ONE TABLET BY MOUTH THREE TIMES DAILY  90 tablet  1  . potassium chloride SA  (K-DUR,KLOR-CON) 20 MEQ tablet Take 1 tablet (20 mEq total) by mouth daily.  30 tablet  5  . pravastatin (PRAVACHOL) 40 MG tablet TAKE ONE TABLET BY MOUTH ONE TIME DAILY  30 tablet  3  . timolol (TIMOPTIC) 0.25 % ophthalmic solution as directed.      . nitroGLYCERIN (NITROSTAT) 0.4 MG SL tablet Place 1 tablet (0.4 mg total) under the tongue every 5 (five) minutes as needed for chest pain.  100 tablet  prn  . [DISCONTINUED] lisinopril (PRINIVIL,ZESTRIL) 20 MG tablet Take 1 tablet by mouth 1 day or 1 dose.       Current Facility-Administered Medications  Medication Dose Route Frequency Provider Last Rate Last Dose  . cyanocobalamin ((VITAMIN B-12)) injection 1,000 mcg  1,000 mcg Intramuscular Q30 days Erby Pian, FNP   1,000 mcg at 01/08/14 1258    Allergies  Allergen Reactions  . Sulfa Antibiotics Nausea Only  . Lisinopril     Hair loss    Patient Active Problem List   Diagnosis Date Noted  . Occlusion and stenosis of carotid artery without mention of cerebral infarction 09/14/2012  . Malaise and fatigue 05/12/2012  . Internal and external hemorrhoids without complication 41/32/4401  . Abdominal pain 09/15/2011  . Vitamin  B12 deficiency 09/15/2011  . Abnormal weight loss 09/15/2011  . Benign hypertensive heart disease without heart failure 05/22/2011  . Pure hypercholesterolemia 05/22/2011  . Carotid stenosis, asymptomatic 05/22/2011  . Dysphagia, unspecified 05/18/2011  . Stricture esophagus 05/18/2011  . Stricture of duodenum 05/18/2011  . Dysphagia 05/15/2011  . Family history of malignant neoplasm of gastrointestinal tract 05/15/2011  . Iron deficiency anemia, unspecified  05/15/2011  . Other general symptoms  05/15/2011  . GERD (gastroesophageal reflux disease) 05/15/2011  . BRBPR (bright red blood per rectum) 05/15/2011  . GERD 02/03/2008  . DIVERTICULOSIS-COLON 02/03/2008  . HEMOCCULT POSITIVE STOOL 02/03/2008    History  Smoking status  . Never Smoker     Smokeless tobacco  . Never Used    History  Alcohol Use No    Family History  Problem Relation Age of Onset  . Leukemia Mother   . Colon cancer Father 45  . Kidney cancer Brother   . Cancer Brother     bladder  . Stroke Sister   . Esophageal cancer Neg Hx   . Stomach cancer Neg Hx     Review of Systems: Constitutional: no fever chills diaphoresis or fatigue or change in weight.  Head and neck: no hearing loss, no epistaxis, no photophobia or visual disturbance. Respiratory: No cough, shortness of breath or wheezing. Cardiovascular: No chest pain peripheral edema, palpitations. Gastrointestinal: No abdominal distention, no abdominal pain, no change in bowel habits hematochezia or melena. Genitourinary: No dysuria, no frequency, no urgency, no nocturia. Musculoskeletal:No arthralgias, no back pain, no gait disturbance or myalgias. Neurological: No dizziness, no headaches, no numbness, no seizures, no syncope, no weakness, no tremors. Hematologic: No lymphadenopathy, no easy bruising. Psychiatric: No confusion, no hallucinations, no sleep disturbance.    Physical Exam: Filed Vitals:   01/22/14 0837  BP: 118/68  Pulse: 61     Assessment / Plan: The general appearance reveals a well-developed well-nourished woman in no distress.  Blood pressure is slightly high today.The head and neck exam reveals pupils equal and reactive.  Extraocular movements are full.  There is no scleral icterus.  The mouth and pharynx are normal.  The neck is supple.  The carotids reveal no bruits.  The jugular venous pressure is normal.  The  thyroid is not enlarged.  There is no lymphadenopathy.  The chest is clear to percussion and auscultation.  There are no rales or rhonchi.  Expansion of the chest is symmetrical.  The precordium is quiet.  The first heart sound is normal.  The second heart sound is physiologically split.  There is no murmur gallop rub or click.  There is no abnormal lift or heave.   The abdomen is soft and nontender.  The bowel sounds are normal.  The liver and spleen are not enlarged.  There are no abdominal masses.  There are no abdominal bruits.  Extremities reveal good pedal pulses.  There is no phlebitis or edema.  There is no cyanosis or clubbing.  Strength is normal and symmetrical in all extremities.  There is no lateralizing weakness.  There are no sensory deficits.  The skin is warm and dry.  There is no rash.      Plan: The patient will return soon for a lexiscan  Myoview stress.  Head sublingual nitroglycerin.  Continue current medication.  Recheck in 4 months for office visit lipid panel hepatic function panel and basal metabolic panel

## 2014-01-22 NOTE — Assessment & Plan Note (Signed)
The patient has been experiencing chest pain radiating to her left arm.  It is a sense of pressure in the chest.  Her last EKG in December 2014 was normal.  He has multiple risk factors for ischemic heart disease.  We will have her return soon for a Lexiscan Myoview stress test . We will also give her a prescription for sublingual nitroglycerin to use.

## 2014-01-22 NOTE — Assessment & Plan Note (Signed)
She has a left carotid bruit.  She is not having any TIA symptoms.

## 2014-01-22 NOTE — Progress Notes (Signed)
Quick Note:  Please report to patient. The recent labs are stable. Continue same medication and careful diet.Potassium is low. Increase Kdur to 20 meq BID ______

## 2014-01-23 ENCOUNTER — Encounter: Payer: Self-pay | Admitting: Cardiology

## 2014-01-24 ENCOUNTER — Ambulatory Visit (HOSPITAL_COMMUNITY): Payer: Medicare PPO | Attending: Cardiovascular Disease | Admitting: Radiology

## 2014-01-24 VITALS — BP 169/67 | Ht 65.0 in | Wt 118.0 lb

## 2014-01-24 DIAGNOSIS — I4949 Other premature depolarization: Secondary | ICD-10-CM

## 2014-01-24 DIAGNOSIS — R079 Chest pain, unspecified: Secondary | ICD-10-CM

## 2014-01-24 DIAGNOSIS — R0602 Shortness of breath: Secondary | ICD-10-CM

## 2014-01-24 MED ORDER — TECHNETIUM TC 99M SESTAMIBI GENERIC - CARDIOLITE
11.0000 | Freq: Once | INTRAVENOUS | Status: AC | PRN
  Administered 2014-01-24: 11 via INTRAVENOUS

## 2014-01-24 MED ORDER — REGADENOSON 0.4 MG/5ML IV SOLN
0.4000 mg | Freq: Once | INTRAVENOUS | Status: AC
  Administered 2014-01-24: 0.4 mg via INTRAVENOUS

## 2014-01-24 MED ORDER — TECHNETIUM TC 99M SESTAMIBI GENERIC - CARDIOLITE
33.0000 | Freq: Once | INTRAVENOUS | Status: AC | PRN
  Administered 2014-01-24: 33 via INTRAVENOUS

## 2014-01-24 NOTE — Progress Notes (Addendum)
[facility] [address] [place], [place] [address] [phone]    Cardiology Nuclear Med Study  Valerie [name] is a 80 y.o. female     MRN : [id number]     DOB: [birth year]  Procedure Date: 01/24/2014  Nuclear Med Background Indication for Stress Test:  Evaluation for Ischemia History: No Hx of CAD, MPI: 10 yrs ago Cardiac Risk Factors: Carotid Disease, Hypertension and Lipids  Symptoms:  Palpitations and Chest Heaviness   Nuclear Pre-Procedure Caffeine/Decaff Intake:  None NPO After: 6 pm   Lungs:  clear O2 Sat: 98% on room air. IV 0.9% NS with Angio Cath:  22g  IV Site: R Forearm  IV Started by:  Perrin Maltese, EMT-P  Chest Size (in):  34 Cup Size: C  Height: 5\' 5"  (1.651 m)  Weight:  118 lb (53.524 kg)  BMI:  Body mass index is 19.64 kg/(m^2). Tech Comments:RX this am    Nuclear Med Study 1 or 2 day study: 1 day  Stress Test Type:  Carlton Adam  Reading MD: n/a  Order Authorizing Provider:  T.Brackbill  Resting Radionuclide: Technetium 56m Sestamibi  Resting Radionuclide Dose: 11.0 mCi   Stress Radionuclide:  Technetium 47m Sestamibi  Stress Radionuclide Dose: 33.0 mCi           Stress Protocol Rest HR: 75 Stress HR: 102  Rest BP: 169/67 Stress BP: 181/57  Exercise Time (min): n/a METS: n/a   Predicted Max HR: 136 bpm % Max HR: 75 bpm Rate Pressure Product: 18462   Dose of Adenosine (mg):  n/a Dose of Lexiscan: 0.4 mg  Dose of Atropine (mg): n/a Dose of Dobutamine: n/a mcg/kg/min (at max HR)  Stress Test Technologist: Ileene Hutchinson EMT-P  Nuclear Technologist:  Charlton Amor, CNMT     Rest Procedure:  Myocardial perfusion imaging was performed at rest 45 minutes following the intravenous administration of Technetium 63m Sestamibi. Rest ECG: NSR with non-specific ST-T wave changes. Occasional PAC and PVC s.  Stress Procedure:  The patient received IV Lexiscan 0.4 mg over 15-seconds.  Technetium 62m Sestamibi injected at  30-seconds.  Quantitative spect images were obtained after a 45 minute delay. Stress ECG: No significant change from baseline ECG  QPS Raw Data Images:  Image quality is affected by the fact that both arms were left by the patient's sides during scanning. There is substantial artifact along the lateral wall Stress Images:  There is decreased uptake in the lateral wall. Rest Images:  Comparison with the stress images reveals no significant change. Subtraction (SDS):  No evidence of ischemia. Transient Ischemic Dilatation (Normal <1.22):  0.82 Lung/Heart Ratio (Normal <0.45):  0.28  Quantitative Gated Spect Images QGS EDV:  n/a ml QGS ESV:  n/a ml  Impression Exercise Capacity:  Lexiscan with no exercise. BP Response:  Normal blood pressure response. Clinical Symptoms:  No significant symptoms noted. ECG Impression:  No significant ST segment change suggestive of ischemia. Comparison with Prior Nuclear Study: No previous nuclear study performed  Overall Impression:  Low risk stress nuclear study with lateral wall attenuation artifact. Gated images are not available, so a non-transmural lateral wall infarction cannot be entirely excluded.. The defect is extensive, but mild and corresponds to expected arm attenuation.   Sanda Klein, MD, [facility] [phone] office [phone] pager

## 2014-01-29 ENCOUNTER — Telehealth: Payer: Self-pay | Admitting: Cardiology

## 2014-01-29 DIAGNOSIS — E876 Hypokalemia: Secondary | ICD-10-CM

## 2014-01-29 MED ORDER — POTASSIUM CHLORIDE CRYS ER 20 MEQ PO TBCR: 20.0000 meq | EXTENDED_RELEASE_TABLET | Freq: Every day | ORAL | Status: DC

## 2014-01-29 MED ORDER — POTASSIUM CHLORIDE CRYS ER 20 MEQ PO TBCR: 20.0000 meq | EXTENDED_RELEASE_TABLET | Freq: Two times a day (BID) | ORAL | Status: DC

## 2014-01-29 NOTE — Telephone Encounter (Signed)
Advised patient of myoview, labs, and medication (K+) change.

## 2014-01-29 NOTE — Telephone Encounter (Signed)
Message copied by Noreta Kue B on Mon Jan 29, 2014  9:20 AM ------      Message from: BRACKBILL, THOMAS      Created: Fri Jan 26, 2014  7:50 AM       Please report. The stress test was low risk. No ischemia. Continue current meds. Use SL NTG if needed for chest discomfort. ------ 

## 2014-01-29 NOTE — Telephone Encounter (Signed)
New message    Returning a nurses call from this am

## 2014-01-29 NOTE — Telephone Encounter (Signed)
Message copied by Earvin Hansen on Mon Jan 29, 2014  9:20 AM ------      Message from: Darlin Coco      Created: Fri Jan 26, 2014  7:50 AM       Please report. The stress test was low risk. No ischemia. Continue current meds. Use SL NTG if needed for chest discomfort. ------

## 2014-01-29 NOTE — Telephone Encounter (Signed)
Message copied by Earvin Hansen on Mon Jan 29, 2014  9:16 AM ------      Message from: Darlin Coco      Created: Mon Jan 22, 2014  7:26 PM       Please report to patient.  The recent labs are stable. Continue same medication and careful diet.Potassium is low.  Increase Kdur to 20 meq BID ------

## 2014-02-07 ENCOUNTER — Other Ambulatory Visit: Payer: Self-pay | Admitting: Cardiology

## 2014-02-12 ENCOUNTER — Encounter (HOSPITAL_COMMUNITY): Payer: Self-pay | Admitting: Emergency Medicine

## 2014-02-12 ENCOUNTER — Emergency Department (HOSPITAL_COMMUNITY)
Admission: EM | Admit: 2014-02-12 | Discharge: 2014-02-12 | Disposition: A | Discharge status: Home / Self Care | Payer: Medicare PPO | Attending: Emergency Medicine | Admitting: Emergency Medicine

## 2014-02-12 ENCOUNTER — Emergency Department (HOSPITAL_COMMUNITY): Payer: Medicare PPO

## 2014-02-12 DIAGNOSIS — E785 Hyperlipidemia, unspecified: Secondary | ICD-10-CM | POA: Insufficient documentation

## 2014-02-12 DIAGNOSIS — R011 Cardiac murmur, unspecified: Secondary | ICD-10-CM | POA: Insufficient documentation

## 2014-02-12 DIAGNOSIS — Z7982 Long term (current) use of aspirin: Secondary | ICD-10-CM | POA: Insufficient documentation

## 2014-02-12 DIAGNOSIS — R079 Chest pain, unspecified: Secondary | ICD-10-CM | POA: Insufficient documentation

## 2014-02-12 DIAGNOSIS — R1013 Epigastric pain: Secondary | ICD-10-CM | POA: Insufficient documentation

## 2014-02-12 DIAGNOSIS — Z9889 Other specified postprocedural states: Secondary | ICD-10-CM | POA: Insufficient documentation

## 2014-02-12 DIAGNOSIS — M129 Arthropathy, unspecified: Secondary | ICD-10-CM | POA: Insufficient documentation

## 2014-02-12 DIAGNOSIS — F411 Generalized anxiety disorder: Secondary | ICD-10-CM | POA: Insufficient documentation

## 2014-02-12 DIAGNOSIS — R141 Gas pain: Secondary | ICD-10-CM | POA: Insufficient documentation

## 2014-02-12 DIAGNOSIS — Z9089 Acquired absence of other organs: Secondary | ICD-10-CM | POA: Insufficient documentation

## 2014-02-12 DIAGNOSIS — R143 Flatulence: Secondary | ICD-10-CM

## 2014-02-12 DIAGNOSIS — H409 Unspecified glaucoma: Secondary | ICD-10-CM | POA: Insufficient documentation

## 2014-02-12 DIAGNOSIS — R142 Eructation: Secondary | ICD-10-CM

## 2014-02-12 DIAGNOSIS — K219 Gastro-esophageal reflux disease without esophagitis: Secondary | ICD-10-CM | POA: Insufficient documentation

## 2014-02-12 DIAGNOSIS — Z79899 Other long term (current) drug therapy: Secondary | ICD-10-CM | POA: Insufficient documentation

## 2014-02-12 DIAGNOSIS — F3289 Other specified depressive episodes: Secondary | ICD-10-CM | POA: Insufficient documentation

## 2014-02-12 DIAGNOSIS — I10 Essential (primary) hypertension: Secondary | ICD-10-CM | POA: Insufficient documentation

## 2014-02-12 DIAGNOSIS — H269 Unspecified cataract: Secondary | ICD-10-CM | POA: Insufficient documentation

## 2014-02-12 DIAGNOSIS — F329 Major depressive disorder, single episode, unspecified: Secondary | ICD-10-CM | POA: Insufficient documentation

## 2014-02-12 LAB — BASIC METABOLIC PANEL
BUN: 21 mg/dL (ref 6–23)
CALCIUM: 9.4 mg/dL (ref 8.4–10.5)
CO2: 25 mEq/L (ref 19–32)
Chloride: 101 mEq/L (ref 96–112)
Creatinine, Ser: 0.78 mg/dL (ref 0.50–1.10)
GFR calc Af Amer: 87 mL/min — ABNORMAL LOW (ref >90)
GFR, EST NON AFRICAN AMERICAN: 75 mL/min — AB (ref >90)
GLUCOSE: 108 mg/dL — AB (ref 70–99)
Potassium: 3.6 mEq/L — ABNORMAL LOW (ref 3.7–5.3)
Sodium: 140 mEq/L (ref 137–147)

## 2014-02-12 LAB — CBC
HEMATOCRIT: 40.2 % (ref 36.0–46.0)
HEMOGLOBIN: 13.7 g/dL (ref 12.0–15.0)
MCH: 32.4 pg (ref 26.0–34.0)
MCHC: 34.1 g/dL (ref 30.0–36.0)
MCV: 95 fL (ref 78.0–100.0)
Platelets: 274 10*3/uL (ref 150–400)
RBC: 4.23 MIL/uL (ref 3.87–5.11)
RDW: 12.6 % (ref 11.5–15.5)
WBC: 10.3 10*3/uL (ref 4.0–10.5)

## 2014-02-12 LAB — I-STAT TROPONIN, ED: Troponin i, poc: 0 ng/mL (ref 0.00–0.08)

## 2014-02-12 NOTE — ED Notes (Signed)
Per ems-- pt from home.  pt reports sharp mid chest pain with back pain starting 2 hours ago. 3/10 pain. ems administered 3 nitro and 1in nitro paste. Pt took pain pill that she normally takes for her leg. Pt sees cardiologist (Dr. Mare Ferrari) for fluttering. Pt had stress test done last week. Pt reports that she felt she couldn't get her breath as well as she should.

## 2014-02-12 NOTE — ED Notes (Signed)
Discharge instructions reviewed with pt. Pt verbalized understanding.   

## 2014-02-12 NOTE — ED Notes (Signed)
Pt took 4 baby asa pta.

## 2014-02-12 NOTE — ED Provider Notes (Signed)
CSN: [id number]     Arrival date & time 02/12/14  0154 History   First MD Initiated Contact with Patient 02/12/14 [phone]     Chief Complaint  Patient presents with  . Chest Pain     (Consider location/radiation/quality/duration/timing/severity/associated sxs/prior Treatment) HPI  This patient is a very pleasant elderly woman with a history of hypertension, hyperlipidemia, referral arterial disease, GERD, hiatal hernia and careful bowel syndrome. Chills has a remote history of duodenitis.  The patient presents after experiencing epigastric pain which radiated to her back. Her symptoms began around 10 PM last night. Prior to this, she had a cheese sandwich for dinner at 8 PM. She describes pain as similar in nature but more severe than previous episodes of similar sx which have, in the past, been diagnosed as hiatal hernia related. She notes that she had quite a bit of belching associated with pain and belching seemed to improve the pain.   Of note, the patient had an outpatient cardiac stress test 2 weeks ago and this study was reported to her as negative for any signs of ischemia. She denies SOB. Her pain lasted a total of approx 1 hour. She is currently pain free.   Past Medical History  Diagnosis Date  . Hypertension   . Hyperlipidemia   . Carotid bruit     BILATERAL  . Palpitations   . Stricture and stenosis of esophagus   . Esophageal reflux   . Duodenitis without mention of hemorrhage   . Diverticulosis of colon with hemorrhage   . IBS (irritable bowel syndrome)   . Family history of malignant neoplasm of gastrointestinal tract   . Arthritis   . Anxiety   . Cataract     rt cateract removed  . Depression   . Heart murmur   . Glaucoma     bil eyes  . Hiatal hernia    Past Surgical History  Procedure Laterality Date  . US echocardiography  06/21/2008    EF 55-60%  . US echocardiography  01/12/2003    EF 55-60%  . Cholecystectomy    . Dilation and curettage of uterus     . Right rotator cuff repair x1    . Left rotator cuff repair x2     Family History  Problem Relation Age of Onset  . Leukemia Mother   . Colon cancer Father 76  . Kidney cancer Brother   . Cancer Brother     bladder  . Stroke Sister   . Esophageal cancer Neg Hx   . Stomach cancer Neg Hx    History  Substance Use Topics  . Smoking status: Never Smoker   . Smokeless tobacco: Never Used  . Alcohol Use: No   OB History   Grav Para Term Preterm Abortions TAB SAB Ect Mult Living                 Review of Systems Ten point review of symptoms performed and is negative with the exception of symptoms noted above.     Allergies  Sulfa antibiotics and Lisinopril  Home Medications   Prior to Admission medications   Medication Sig Start Date End Date Taking? Authorizing Provider  ALPRAZolam Duanne Moron) 0.25 MG tablet Take 0.25 mg by mouth daily as needed for anxiety.   Yes Historical Provider, MD  amLODipine (NORVASC) 5 MG tablet Take 1 tablet (5 mg total) by mouth daily. 12/04/13  Yes Darlin Coco, MD  aspirin 81 MG tablet Take 81 mg by  mouth daily.     Yes Historical Provider, MD  Cyanocobalamin (VITAMIN B-12 IJ) Inject 1 Applicatorful as directed every 30 (thirty) days.   Yes Historical Provider, MD  diltiazem (CARDIZEM CD) 120 MG 24 hr capsule Take 120 mg by mouth daily.   Yes Historical Provider, MD  esomeprazole (NEXIUM) 40 MG capsule Take 1 capsule (40 mg total) by mouth daily before breakfast. 09/13/13  Yes Sable Feil, MD  hydrochlorothiazide (HYDRODIURIL) 25 MG tablet Take 12.5 mg by mouth daily.   Yes Historical Provider, MD  HYDROcodone-acetaminophen (NORCO/VICODIN) 5-325 MG per tablet Take 1 tablet by mouth every 6 (six) hours as needed for moderate pain.   Yes Historical Provider, MD  latanoprost (XALATAN) 0.005 % ophthalmic solution Place 1 drop into both eyes at bedtime.   Yes Historical Provider, MD  metoprolol (LOPRESSOR) 50 MG tablet Take 50 mg by mouth 2  (two) times daily.   Yes Historical Provider, MD  nitroGLYCERIN (NITROSTAT) 0.4 MG SL tablet Place 1 tablet (0.4 mg total) under the tongue every 5 (five) minutes as needed for chest pain. 01/22/14  Yes Darlin Coco, MD  potassium chloride SA (K-DUR,KLOR-CON) 20 MEQ tablet Take 1 tablet (20 mEq total) by mouth 2 (two) times daily. 01/29/14  Yes Darlin Coco, MD  pravastatin (PRAVACHOL) 40 MG tablet Take 40 mg by mouth daily.   Yes Historical Provider, MD  PRESCRIPTION MEDICATION Place 1 drop into both eyes every morning. Eye drop   Yes Historical Provider, MD   BP 137/65  Pulse 62  Temp(Src) 98 F (36.7 C) (Oral)  Resp 16  SpO2 98% Physical Exam Gen: well developed and well nourished appearing Head: NCAT Eyes: PERL, EOMI Nose: no epistaixis or rhinorrhea Mouth/throat: mucosa is moist and pink Neck: supple, no stridor Lungs: CTA B, no wheezing, rhonchi or rales CV: RRR, no murmur, extremities appear well perfused.  Abd: soft, notender, nondistended Back: no ttp, no cva ttp Skin: warm and dry Ext: normal to inspection, no dependent edema Neuro: CN ii-xii grossly intact, no focal deficits Psyche; normal affect,  calm and cooperative.   ED Course  Procedures (including critical care time) Labs Review  Results for orders placed during the hospital encounter of 02/12/14 (from the past 24 hour(s))  CBC     Status: None   Collection Time    02/12/14  2:25 AM      Result Value Ref Range   WBC 10.3  4.0 - 10.5 K/uL   RBC 4.23  3.87 - 5.11 MIL/uL   Hemoglobin 13.7  12.0 - 15.0 g/dL   HCT 40.2  36.0 - 46.0 %   MCV 95.0  78.0 - 100.0 fL   MCH 32.4  26.0 - 34.0 pg   MCHC 34.1  30.0 - 36.0 g/dL   RDW 12.6  11.5 - 15.5 %   Platelets 274  150 - 400 K/uL  BASIC METABOLIC PANEL     Status: Abnormal   Collection Time    02/12/14  2:25 AM      Result Value Ref Range   Sodium 140  137 - 147 mEq/L   Potassium 3.6 (*) 3.7 - 5.3 mEq/L   Chloride 101  96 - 112 mEq/L   CO2 25  19 - 32  mEq/L   Glucose, Bld 108 (*) 70 - 99 mg/dL   BUN 21  6 - 23 mg/dL   Creatinine, Ser 0.78  0.50 - 1.10 mg/dL   Calcium 9.4  8.4 - 10.5  mg/dL   GFR calc non Af Amer 75 (*) >90 mL/min   GFR calc Af Amer 87 (*) >90 mL/min  I-STAT TROPOININ, ED     Status: None   Collection Time    02/12/14  2:31 AM      Result Value Ref Range   Troponin i, poc 0.00  0.00 - 0.08 ng/mL   Comment 3               Imaging Review Dg Chest Port 1 View  02/12/2014   CLINICAL DATA:  Chest pain.  EXAM: PORTABLE CHEST - 1 VIEW  COMPARISON:  Chest x-ray 11/30/2008.  FINDINGS: Lung volumes are low. No consolidative airspace disease. No pleural effusions. No pneumothorax. No pulmonary nodule or mass noted. Pulmonary vasculature and the cardiomediastinal silhouette are within normal limits. Atherosclerosis. Status post bilateral shoulder arthroplasty.  IMPRESSION: 1. Low lung volumes without radiographic evidence of acute cardiopulmonary disease. 2. Atherosclerosis.   Electronically Signed   By: Vinnie Langton M.D.   On: 02/12/2014 02:48    Results for orders placed during the hospital encounter of 02/12/14 (from the past 24 hour(s))  CBC     Status: None   Collection Time    02/12/14  2:25 AM      Result Value Ref Range   WBC 10.3  4.0 - 10.5 K/uL   RBC 4.23  3.87 - 5.11 MIL/uL   Hemoglobin 13.7  12.0 - 15.0 g/dL   HCT 40.2  36.0 - 46.0 %   MCV 95.0  78.0 - 100.0 fL   MCH 32.4  26.0 - 34.0 pg   MCHC 34.1  30.0 - 36.0 g/dL   RDW 12.6  11.5 - 15.5 %   Platelets 274  150 - 400 K/uL  BASIC METABOLIC PANEL     Status: Abnormal   Collection Time    02/12/14  2:25 AM      Result Value Ref Range   Sodium 140  137 - 147 mEq/L   Potassium 3.6 (*) 3.7 - 5.3 mEq/L   Chloride 101  96 - 112 mEq/L   CO2 25  19 - 32 mEq/L   Glucose, Bld 108 (*) 70 - 99 mg/dL   BUN 21  6 - 23 mg/dL   Creatinine, Ser 0.78  0.50 - 1.10 mg/dL   Calcium 9.4  8.4 - 10.5 mg/dL   GFR calc non Af Amer 75 (*) >90 mL/min   GFR calc Af Amer 87  (*) >90 mL/min  I-STAT TROPOININ, ED     Status: None   Collection Time    02/12/14  2:31 AM      Result Value Ref Range   Troponin i, poc 0.00  0.00 - 0.08 ng/mL   Comment 3            EKG: nsr, no acute ischemic changes, normal intervals, normal axis, normal qrs complex  MDM    Patient s/p episode of epigastric pain radiating into chest. History of hiatal hernia, GERD and remote history of duodenitis. CXR has ruled out pneumonia, ptx, pleural effusion. Do not suspect pulmonary embolus as cause of sx which spontaneously resolved. We can confidently rule out ACS in light of negative cardiac stress test performed within the past two weeks. The patient is currently taking Nexium. I have suggested that she continue this, follow up with her PCP in 1 to 2 days as well as her gastroenterologist. Counseled re: return precautions as well.   Almyra Free  Cheri Guppy, MD 02/12/14 [phone]

## 2014-02-15 ENCOUNTER — Ambulatory Visit (INDEPENDENT_AMBULATORY_CARE_PROVIDER_SITE_OTHER): Payer: Medicare HMO | Admitting: *Deleted

## 2014-02-15 DIAGNOSIS — E538 Deficiency of other specified B group vitamins: Secondary | ICD-10-CM

## 2014-02-15 NOTE — Progress Notes (Signed)
Vitamin b12 given and tolerated well. 

## 2014-02-15 NOTE — Patient Instructions (Signed)
Vitamin B12 Injections Every person needs vitamin B12. A deficiency develops when the body does not get enough of it. One way to overcome this is by getting B12 shots (injections). A B12 shot puts the vitamin directly into muscle tissue. This avoids any problems your body might have in absorbing it from food or a pill. In some people, the body has trouble using the vitamin correctly. This can cause a B12 deficiency. Not consuming enough of the vitamin can also cause a deficiency. Getting enough vitamin B12 can be hard for elderly people. Sometimes, they do not eat a well-balanced diet. The elderly are also more likely than younger people to have medical conditions or take medications that can lead to a deficiency. WHAT DOES VITAMIN B12 DO? Vitamin B12 does many things to help the body work right:  It helps the body make healthy red blood cells.  It helps maintain nerve cells.  It is involved in the body's process of converting food into energy (metabolism).  It is needed to make the genetic material in all cells (DNA). VITAMIN B12 FOOD SOURCES Most people get plenty of vitamin B12 through the foods they eat. It is present in:  Meat, fish, poultry, and eggs.  Milk and milk products.  It also is added when certain foods are made, including some breads, cereals and yogurts. The food is then called "fortified". CAUSES The most common causes of vitamin B12 deficiency are:  Pernicious anemia. The condition develops when the body cannot make enough healthy red blood cells. This stems from a lack of a protein made in the stomach (intrinsic factor). People without this protein cannot absorb enough vitamin B12 from food.  Malabsorption. This is when the body cannot absorb the vitamin. It can be caused by:  Pernicious anemia.  Surgery to remove part or all of the stomach can lead to malabsorption. Removal of part or all of the small intestine can also cause malabsorption.  Vegetarian diet.  People who are strict about not eating foods from animals could have trouble taking in enough vitamin B12 from diet alone.  Medications. Some medicines have been linked to B12 deficiency, such as Metformin (a drug prescribed for type 2 diabetes). Long-term use of stomach acid suppressants also can keep the vitamin from being absorbed.  Intestinal problems such as inflammatory bowel disease. If there are problems in the digestive tract, vitamin B12 may not be absorbed in good enough amounts. SYMPTOMS People who do not get enough B12 can develop problems. These can include:  Anemia. This is when the body has too few red blood cells. Red blood cells carry oxygen to the rest of the body. Without a healthy supply of red blood cells, people can feel:  Tired (fatigued).  Weak.  Severe anemia can cause:  Shortness of breath.  Dizziness.  Rapid heart rate.  Paleness.  Other Vitamin B12 deficiency symptoms include:  Diarrhea.  Numbness or tingling in the hands or feet.  Loss of appetite.  Confusion.  Sores on the tongue or in the mouth. LET YOUR CAREGIVER KNOW ABOUT:  Any allergies. It is very important to know if you are allergic or sensitive to cobalt. Vitamin B12 contains cobalt.  Any history of kidney disease.  All medications you are taking. Include prescription and over-the-counter medicines, herbs and creams.  Whether you are pregnant or breast-feeding.  If you have Leber's disease, a hereditary eye condition, vitamin B12 could make it worse. RISKS AND COMPLICATIONS Reactions to an injection are   usually temporary. They might include:  Pain at the injection site.  Redness, swelling or tenderness at the site.  Headache, dizziness or weakness.  Nausea, upset stomach or diarrhea.  Numbness or tingling.  Fever.  Joint pain.  Itching or rash. If a reaction does not go away in a short while, talk with your healthcare provider. A change in the way the shots are  given, or where they are given, might need to be made. BEFORE AN INJECTION To decide whether B12 injections are right for you, your healthcare provider will probably:  Ask about your medical history.  Ask questions about your diet.  Ask about symptoms such as:  Have you felt weak?  Do you feel unusually tired?  Do you get dizzy?  Order blood tests. These may include a test to:  Check the level of red cells in your blood.  Measure B12 levels.  Check for the presence of intrinsic factor. VITAMIN B12 INJECTIONS How often you will need a vitamin B12 injection will depend on how severe your deficiency is. This also will affect how long you will need to get them. People with pernicious anemia usually get injections for their entire life. Others might get them for a shorter period. For many people, injections are given daily or weekly for several weeks. Then, once B12 levels are normal, injections are given just once a month. If the cause of the deficiency can be fixed, the injections can be stopped. Talk with your healthcare provider about what you should expect. For an injection:  The injection site will be cleaned with an alcohol swab.  Your healthcare provider will insert a needle directly into a muscle. Most any muscle can be used. Most often, an arm muscle is used. A buttocks muscle can also be used. Many people say shots in that area are less painful.  A small adhesive bandage may be put over the injection site. It usually can be taken off in an hour or less. Injections can be given by your healthcare provider. In some cases, family members give them. Sometimes, people give them to themselves. Talk with your healthcare provider about what would be best for you. If someone other than your healthcare provider will be giving the shots, the person will need to be trained to give them correctly. HOME CARE INSTRUCTIONS   You can remove the adhesive bandage within an hour of getting a  shot.  You should be able to go about your normal activities right away.  Avoid drinking large amounts of alcohol while taking vitamin B12 shots. Alcohol can interfere with the body's use of the vitamin. SEEK MEDICAL CARE IF:   Pain, redness, swelling or tenderness at the injection site does not get better or gets worse.  Headache, dizziness or weakness does not go away.  You develop a fever of more than 100.5 F (38.1 C). SEEK IMMEDIATE MEDICAL CARE IF:   You have chest pain.  You develop shortness of breath.  You have muscle weakness that gets worse.  You develop numbness, weakness or tingling on one side or one area of the body.  You have symptoms of an allergic reaction, such as:  Hives.  Difficulty breathing.  Swelling of the lips, face, tongue or throat.  You develop a fever of more than 102.0 F (38.9 C). MAKE SURE YOU:   Understand these instructions.  Will watch your condition.  Will get help right away if you are not doing well or get worse. Document   Released: 12/11/2008 Document Revised: 12/07/2011 Document Reviewed: 12/11/2008 [employer] Patient Information 2014 [employer].  

## 2014-02-16 ENCOUNTER — Ambulatory Visit: Payer: Medicare HMO

## 2014-02-17 ENCOUNTER — Other Ambulatory Visit: Payer: Self-pay | Admitting: Nurse Practitioner

## 2014-03-09 ENCOUNTER — Other Ambulatory Visit: Payer: Self-pay | Admitting: Cardiology

## 2014-03-13 ENCOUNTER — Encounter: Payer: Self-pay | Admitting: Family

## 2014-03-14 ENCOUNTER — Ambulatory Visit (HOSPITAL_COMMUNITY)
Admission: RE | Admit: 2014-03-14 | Discharge: 2014-03-14 | Disposition: A | Discharge status: Home / Self Care | Payer: Medicare PPO | Source: Ambulatory Visit | Attending: Family | Admitting: Family

## 2014-03-14 ENCOUNTER — Ambulatory Visit (INDEPENDENT_AMBULATORY_CARE_PROVIDER_SITE_OTHER): Payer: Medicare PPO | Admitting: Family

## 2014-03-14 ENCOUNTER — Encounter: Payer: Self-pay | Admitting: Family

## 2014-03-14 VITALS — BP 147/65 | HR 43 | Resp 14 | Ht 61.0 in | Wt 118.0 lb

## 2014-03-14 DIAGNOSIS — I6529 Occlusion and stenosis of unspecified carotid artery: Secondary | ICD-10-CM

## 2014-03-14 NOTE — Progress Notes (Signed)
Established Carotid Patient   History of Present Illness  Valerie [name] is a 80 y.o. female patient of Dr. Scot Dock with mild carotid disease bilaterally. At her last visit Dr. Scot Dock indicated that it would be safe to stretch her followed up out to 18 months, she returns today for follow up.  Patient has not had previous carotid artery intervention.  Patient has Negative history of TIA or stroke symptom.  The patient denies amaurosis fugax or monocular blindness.  The patient  denies facial drooping.  Pt. denies hemiplegia.  The patient denies receptive or expressive aphasia.  Pt. denies extremity weakness. She denies any claudication symptoms with walking, denies non healing wounds.  Pt  denies New Medical or Surgical History.  Pt Diabetic: No Pt smoker: non-smoker She does not use ETOH, is not sedentary.  Pt meds include: Statin : Yes ASA: Yes Other anticoagulants/antiplatelets: no   Past Medical History  Diagnosis Date  . Hypertension   . Hyperlipidemia   . Carotid bruit     BILATERAL  . Palpitations   . Stricture and stenosis of esophagus   . Esophageal reflux   . Duodenitis without mention of hemorrhage   . Diverticulosis of colon with hemorrhage   . IBS (irritable bowel syndrome)   . Family history of malignant neoplasm of gastrointestinal tract   . Arthritis   . Anxiety   . Cataract     rt cateract removed  . Depression   . Heart murmur   . Glaucoma     bil eyes  . Hiatal hernia   . UTI (urinary tract infection) March 2015  . Carotid artery occlusion     Social History History  Substance Use Topics  . Smoking status: Never Smoker   . Smokeless tobacco: Never Used  . Alcohol Use: No    Family History Family History  Problem Relation Age of Onset  . Leukemia Mother   . Colon cancer Father 21  . Kidney cancer Brother   . Cancer Brother     bladder  . Stroke Sister   . Esophageal cancer Neg Hx   . Stomach cancer Neg Hx     Surgical  History Past Surgical History  Procedure Laterality Date  . US echocardiography  06/21/2008    EF 55-60%  . US echocardiography  01/12/2003    EF 55-60%  . Dilation and curettage of uterus    . Right rotator cuff repair x1    . Left rotator cuff repair x2    . Cholecystectomy      Gall Bladder    Allergies  Allergen Reactions  . Lisinopril Other (See Comments)    Hair loss  . Sulfa Antibiotics Nausea Only    Current Outpatient Prescriptions  Medication Sig Dispense Refill  . ALPRAZolam (XANAX) 0.25 MG tablet TAKE ONE TABLET BY MOUTH DAILY AS NEEDED  30 tablet  2  . amLODipine (NORVASC) 5 MG tablet Take 1 tablet (5 mg total) by mouth daily.  30 tablet  5  . aspirin 81 MG tablet Take 81 mg by mouth daily.        . Cyanocobalamin (VITAMIN B-12 IJ) Inject 1 Applicatorful as directed every 30 (thirty) days.      Marland Kitchen diltiazem (CARDIZEM CD) 120 MG 24 hr capsule Take 120 mg by mouth daily.      Marland Kitchen esomeprazole (NEXIUM) 40 MG capsule Take 1 capsule (40 mg total) by mouth daily before breakfast.  30 capsule  0  .  hydrochlorothiazide (HYDRODIURIL) 25 MG tablet Take 12.5 mg by mouth daily.      Marland Kitchen HYDROcodone-acetaminophen (NORCO/VICODIN) 5-325 MG per tablet Take 1 tablet by mouth every 6 (six) hours as needed for moderate pain.      Marland Kitchen latanoprost (XALATAN) 0.005 % ophthalmic solution Place 1 drop into both eyes at bedtime.      . metoprolol (LOPRESSOR) 50 MG tablet Take 50 mg by mouth 2 (two) times daily.      . nitroGLYCERIN (NITROSTAT) 0.4 MG SL tablet Place 1 tablet (0.4 mg total) under the tongue every 5 (five) minutes as needed for chest pain.  100 tablet  prn  . Potassium 75 MG TABS Take by mouth 2 (two) times daily.      . potassium chloride SA (K-DUR,KLOR-CON) 20 MEQ tablet Take 1 tablet (20 mEq total) by mouth 2 (two) times daily.  60 tablet  5  . pravastatin (PRAVACHOL) 40 MG tablet TAKE ONE TABLET BY MOUTH ONE TIME DAILY  30 tablet  4  . PRESCRIPTION MEDICATION Place 1 drop into both  eyes every morning. Eye drop      . [DISCONTINUED] lisinopril (PRINIVIL,ZESTRIL) 20 MG tablet Take 1 tablet by mouth 1 day or 1 dose.       Current Facility-Administered Medications  Medication Dose Route Frequency Provider Last Rate Last Dose  . cyanocobalamin ((VITAMIN B-12)) injection 1,000 mcg  1,000 mcg Intramuscular Q30 days Erby Pian, FNP   1,000 mcg at 02/15/14 1015    Review of Systems : See HPI for pertinent positives and negatives.  Physical Examination  Filed Vitals:   03/14/14 1215  BP: 147/65  Pulse: 43  Resp: 14    General: WDWN female in NAD GAIT: normal Eyes: PERRLA Pulmonary:  Non-labored, CTAB, Negative  Rales, Negative rhonchi, & Negative wheezing.  Cardiac: regular Rhythm ,  Negative detected murmur.  VASCULAR EXAM Carotid Bruits Left Right   Positive, prominent  Positive, moderate    Aorta is not palpable. Radial pulses are 2+ palpable and equal.                                                                                                                            LE Pulses LEFT RIGHT       POPLITEAL  not palpable   not palpable       POSTERIOR TIBIAL  not palpable   2+ palpable        DORSALIS PEDIS      ANTERIOR TIBIAL 2+ palpable  1+ palpable     Gastrointestinal: soft, nontender, BS WNL, no r/g,  negative masses.  Musculoskeletal: Negative muscle atrophy/wasting. M/S 5/5 throughout, Extremities without ischemic changes, bunions.  Neurologic: A&O X 3; Appropriate Affect ; SENSATION ;normal;  Speech is normal CN 2-12 intact, Pain and light touch intact in extremities, Motor exam as listed above.   Non-Invasive Vascular Imaging CAROTID DUPLEX 03/14/2014   CEREBROVASCULAR DUPLEX EVALUATION    INDICATION:  Carotid artery disease     PREVIOUS INTERVENTION(S): None.    DUPLEX EXAM:     RIGHT  LEFT  Peak Systolic Velocities (cm/s) End Diastolic Velocities (cm/s) Plaque LOCATION Peak Systolic Velocities (cm/s) End Diastolic  Velocities (cm/s) Plaque  52 8  CCA PROXIMAL 60 10   44 9 HT CCA MID 70 12   63 12 HT CCA DISTAL 63 12 HT  39 0 HT ECA 72 0   46 10 HT ICA PROXIMAL 33 7 HT  63 16  ICA MID 128 21   88 22  ICA DISTAL 140 21     2.0 ICA / CCA Ratio (PSV) 2.0  Antegrade  Vertebral Flow Antegrade   831 Brachial Systolic Pressure (mmHg) 517  Triphasic  Brachial Artery Waveforms Triphasic     Plaque Morphology:  HM = Homogeneous, HT = Heterogeneous, CP = Calcific Plaque, SP = Smooth Plaque, IP = Irregular Plaque     ADDITIONAL FINDINGS:     IMPRESSION: Bilateral internal carotid artery velocities suggest a <40% stenosis.     Compared to the previous exam:  No significant change in comparison to the last exam on 09/14/2012.    Assessment: LAJUNE PERINE is a 80 y.o. female who presents with asymptomatic minimal bilateral ICA stenosis. No significant change in comparison to the last exam on 09/14/2012.  Plan: Follow-up in 18  months with Carotid Duplex scan.   I discussed in depth with the patient the nature of atherosclerosis, and emphasized the importance of maximal medical management including strict control of blood pressure, blood glucose, and lipid levels, obtaining regular exercise, and continued cessation of smoking.  The patient is aware that without maximal medical management the underlying atherosclerotic disease process will progress, limiting the benefit of any interventions. The patient was given information about stroke prevention and what symptoms should prompt the patient to seek immediate medical care. Thank you for allowing [place] to participate in this patient's care.  Clemon Chambers, RN, MSN, FNP-C [facility] Office: [phone]  Clinic Physician: Scot Dock  03/14/2014 12:24 PM

## 2014-03-14 NOTE — Patient Instructions (Signed)
Stroke Prevention Some medical conditions and behaviors are associated with an increased chance of having a stroke. You may prevent a stroke by making healthy choices and managing medical conditions. HOW CAN I REDUCE MY RISK OF HAVING A STROKE?   Stay physically active. Get at least 30 minutes of activity on most or all days.  Do not smoke. It may also be helpful to avoid exposure to secondhand smoke.  Limit alcohol use. Moderate alcohol use is considered to be:  No more than 2 drinks per day for men.  No more than 1 drink per day for nonpregnant women.  Eat healthy foods. This involves  Eating 5 or more servings of fruits and vegetables a day.  Following a diet that addresses high blood pressure (hypertension), high cholesterol, diabetes, or obesity.  Manage your cholesterol levels.  A diet low in saturated fat, trans fat, and cholesterol and high in fiber may control cholesterol levels.  Take any prescribed medicines to control cholesterol as directed by your health care Valerie [name].  Manage your diabetes.  A controlled-carbohydrate, controlled-sugar diet is recommended to manage diabetes.  Take any prescribed medicines to control diabetes as directed by your health care Valerie [name].  Control your hypertension.  A low-salt (sodium), low-saturated fat, low-trans fat, and low-cholesterol diet is recommended to manage hypertension.  Take any prescribed medicines to control hypertension as directed by your health care Valerie [name].  Maintain a healthy weight.  A reduced-calorie, low-sodium, low-saturated fat, low-trans fat, low-cholesterol diet is recommended to manage weight.  Stop drug abuse.  Avoid taking birth control pills.  Talk to your health care Valerie [name] about the risks of taking birth control pills if you are over 35 years old, smoke, get migraines, or have ever had a blood clot.  Get evaluated for sleep disorders (sleep apnea).  Talk to your health care Valerie [name] about  getting a sleep evaluation if you snore a lot or have excessive sleepiness.  Take medicines as directed by your health care Valerie [name].  For some people, aspirin or blood thinners (anticoagulants) are helpful in reducing the risk of forming abnormal blood clots that can lead to stroke. If you have the irregular heart rhythm of atrial fibrillation, you should be on a blood thinner unless there is a good reason you cannot take them.  Understand all your medicine instructions.  Make sure that other other conditions (such as anemia or atherosclerosis) are addressed. SEEK IMMEDIATE MEDICAL CARE IF:   You have sudden weakness or numbness of the face, arm, or leg, especially on one side of the body.  Your face or eyelid droops to one side.  You have sudden confusion.  You have trouble speaking (aphasia) or understanding.  You have sudden trouble seeing in one or both eyes.  You have sudden trouble walking.  You have dizziness.  You have a loss of balance or coordination.  You have a sudden, severe headache with no known cause.  You have new chest pain or an irregular heartbeat. Any of these symptoms may represent a serious problem that is an emergency. Do not wait to see if the symptoms will go away. Get medical help at once. Call your local emergency services  (911 in [place] Do not drive yourself to the hospital. Document Released: 10/22/2004 Document Revised: 07/05/2013 Document Reviewed: 03/17/2013 Exit[employer] Patient Information 2015 [employer]. This information is not intended to replace advice given to you by your health care Valerie [name]. Make sure you discuss any questions you have with your health   care Valerie [name].  

## 2014-03-22 ENCOUNTER — Other Ambulatory Visit: Payer: Self-pay | Admitting: Cardiology

## 2014-03-27 ENCOUNTER — Ambulatory Visit (INDEPENDENT_AMBULATORY_CARE_PROVIDER_SITE_OTHER): Payer: Medicare HMO | Admitting: *Deleted

## 2014-03-27 ENCOUNTER — Other Ambulatory Visit: Payer: Self-pay | Admitting: Cardiology

## 2014-03-27 DIAGNOSIS — E538 Deficiency of other specified B group vitamins: Secondary | ICD-10-CM

## 2014-03-27 NOTE — Patient Instructions (Signed)
Vitamin B12 Injections Every person needs vitamin B12. A deficiency develops when the body does not get enough of it. One way to overcome this is by getting B12 shots (injections). A B12 shot puts the vitamin directly into muscle tissue. This avoids any problems your body might have in absorbing it from food or a pill. In some people, the body has trouble using the vitamin correctly. This can cause a B12 deficiency. Not consuming enough of the vitamin can also cause a deficiency. Getting enough vitamin B12 can be hard for elderly people. Sometimes, they do not eat a well-balanced diet. The elderly are also more likely than younger people to have medical conditions or take medications that can lead to a deficiency. WHAT DOES VITAMIN B12 DO? Vitamin B12 does many things to help the body work right:  It helps the body make healthy red blood cells.  It helps maintain nerve cells.  It is involved in the body's process of converting food into energy (metabolism).  It is needed to make the genetic material in all cells (DNA). VITAMIN B12 FOOD SOURCES Most people get plenty of vitamin B12 through the foods they eat. It is present in:  Meat, fish, poultry, and eggs.  Milk and milk products.  It also is added when certain foods are made, including some breads, cereals and yogurts. The food is then called "fortified". CAUSES The most common causes of vitamin B12 deficiency are:  Pernicious anemia. The condition develops when the body cannot make enough healthy red blood cells. This stems from a lack of a protein made in the stomach (intrinsic factor). People without this protein cannot absorb enough vitamin B12 from food.  Malabsorption. This is when the body cannot absorb the vitamin. It can be caused by:  Pernicious anemia.  Surgery to remove part or all of the stomach can lead to malabsorption. Removal of part or all of the small intestine can also cause malabsorption.  Vegetarian diet.  People who are strict about not eating foods from animals could have trouble taking in enough vitamin B12 from diet alone.  Medications. Some medicines have been linked to B12 deficiency, such as Metformin (a drug prescribed for type 2 diabetes). Long-term use of stomach acid suppressants also can keep the vitamin from being absorbed.  Intestinal problems such as inflammatory bowel disease. If there are problems in the digestive tract, vitamin B12 may not be absorbed in good enough amounts. SYMPTOMS People who do not get enough B12 can develop problems. These can include:  Anemia. This is when the body has too few red blood cells. Red blood cells carry oxygen to the rest of the body. Without a healthy supply of red blood cells, people can feel:  Tired (fatigued).  Weak.  Severe anemia can cause:  Shortness of breath.  Dizziness.  Rapid heart rate.  Paleness.  Other Vitamin B12 deficiency symptoms include:  Diarrhea.  Numbness or tingling in the hands or feet.  Loss of appetite.  Confusion.  Sores on the tongue or in the mouth. LET YOUR CAREGIVER KNOW ABOUT:  Any allergies. It is very important to know if you are allergic or sensitive to cobalt. Vitamin B12 contains cobalt.  Any history of kidney disease.  All medications you are taking. Include prescription and over-the-counter medicines, herbs and creams.  Whether you are pregnant or breast-feeding.  If you have Leber's disease, a hereditary eye condition, vitamin B12 could make it worse. RISKS AND COMPLICATIONS Reactions to an injection are   usually temporary. They might include:  Pain at the injection site.  Redness, swelling or tenderness at the site.  Headache, dizziness or weakness.  Nausea, upset stomach or diarrhea.  Numbness or tingling.  Fever.  Joint pain.  Itching or rash. If a reaction does not go away in a short while, talk with your healthcare provider. A change in the way the shots are  given, or where they are given, might need to be made. BEFORE AN INJECTION To decide whether B12 injections are right for you, your healthcare provider will probably:  Ask about your medical history.  Ask questions about your diet.  Ask about symptoms such as:  Have you felt weak?  Do you feel unusually tired?  Do you get dizzy?  Order blood tests. These may include a test to:  Check the level of red cells in your blood.  Measure B12 levels.  Check for the presence of intrinsic factor. VITAMIN B12 INJECTIONS How often you will need a vitamin B12 injection will depend on how severe your deficiency is. This also will affect how long you will need to get them. People with pernicious anemia usually get injections for their entire life. Others might get them for a shorter period. For many people, injections are given daily or weekly for several weeks. Then, once B12 levels are normal, injections are given just once a month. If the cause of the deficiency can be fixed, the injections can be stopped. Talk with your healthcare provider about what you should expect. For an injection:  The injection site will be cleaned with an alcohol swab.  Your healthcare provider will insert a needle directly into a muscle. Most any muscle can be used. Most often, an arm muscle is used. A buttocks muscle can also be used. Many people say shots in that area are less painful.  A small adhesive bandage may be put over the injection site. It usually can be taken off in an hour or less. Injections can be given by your healthcare provider. In some cases, family members give them. Sometimes, people give them to themselves. Talk with your healthcare provider about what would be best for you. If someone other than your healthcare provider will be giving the shots, the person will need to be trained to give them correctly. HOME CARE INSTRUCTIONS   You can remove the adhesive bandage within an hour of getting a  shot.  You should be able to go about your normal activities right away.  Avoid drinking large amounts of alcohol while taking vitamin B12 shots. Alcohol can interfere with the body's use of the vitamin. SEEK MEDICAL CARE IF:   Pain, redness, swelling or tenderness at the injection site does not get better or gets worse.  Headache, dizziness or weakness does not go away.  You develop a fever of more than 100.5 F (38.1 C). SEEK IMMEDIATE MEDICAL CARE IF:   You have chest pain.  You develop shortness of breath.  You have muscle weakness that gets worse.  You develop numbness, weakness or tingling on one side or one area of the body.  You have symptoms of an allergic reaction, such as:  Hives.  Difficulty breathing.  Swelling of the lips, face, tongue or throat.  You develop a fever of more than 102.0 F (38.9 C). MAKE SURE YOU:   Understand these instructions.  Will watch your condition.  Will get help right away if you are not doing well or get worse. Document   Released: 12/11/2008 Document Revised: 12/07/2011 Document Reviewed: 12/11/2008 Exit[employer] Patient Information 2015 [employer]. This information is not intended to replace advice given to you by your health care provider. Make sure you discuss any questions you have with your health care provider.  

## 2014-03-27 NOTE — Progress Notes (Signed)
Vitamin b12 given and tolerated well. 

## 2014-05-03 ENCOUNTER — Other Ambulatory Visit: Payer: Self-pay | Admitting: Cardiology

## 2014-05-11 ENCOUNTER — Ambulatory Visit (INDEPENDENT_AMBULATORY_CARE_PROVIDER_SITE_OTHER): Payer: Medicare HMO | Admitting: *Deleted

## 2014-05-11 DIAGNOSIS — E538 Deficiency of other specified B group vitamins: Secondary | ICD-10-CM

## 2014-05-11 NOTE — Progress Notes (Signed)
Patient ID: Valerie [name], female   DOB: 12/04/1927, 80 y.o.   MRN: [id number] Pt tolerated inj well 

## 2014-05-24 ENCOUNTER — Ambulatory Visit (INDEPENDENT_AMBULATORY_CARE_PROVIDER_SITE_OTHER): Payer: Medicare PPO | Admitting: Cardiology

## 2014-05-24 ENCOUNTER — Other Ambulatory Visit: Payer: Medicare PPO

## 2014-05-24 ENCOUNTER — Encounter: Payer: Self-pay | Admitting: Cardiology

## 2014-05-24 VITALS — BP 146/68 | HR 72 | Ht 64.5 in | Wt 121.0 lb

## 2014-05-24 DIAGNOSIS — I6523 Occlusion and stenosis of bilateral carotid arteries: Secondary | ICD-10-CM

## 2014-05-24 DIAGNOSIS — I119 Hypertensive heart disease without heart failure: Secondary | ICD-10-CM

## 2014-05-24 DIAGNOSIS — I6529 Occlusion and stenosis of unspecified carotid artery: Secondary | ICD-10-CM

## 2014-05-24 DIAGNOSIS — E7800 Pure hypercholesterolemia, unspecified: Secondary | ICD-10-CM

## 2014-05-24 DIAGNOSIS — K219 Gastro-esophageal reflux disease without esophagitis: Secondary | ICD-10-CM

## 2014-05-24 DIAGNOSIS — I658 Occlusion and stenosis of other precerebral arteries: Secondary | ICD-10-CM

## 2014-05-24 DIAGNOSIS — E876 Hypokalemia: Secondary | ICD-10-CM

## 2014-05-24 DIAGNOSIS — R0989 Other specified symptoms and signs involving the circulatory and respiratory systems: Secondary | ICD-10-CM

## 2014-05-24 LAB — HEPATIC FUNCTION PANEL
ALBUMIN: 3.8 g/dL (ref 3.5–5.2)
ALT: 123 U/L — AB (ref 0–35)
AST: 43 U/L — ABNORMAL HIGH (ref 0–37)
Alkaline Phosphatase: 112 U/L (ref 39–117)
BILIRUBIN TOTAL: 0.9 mg/dL (ref 0.2–1.2)
Bilirubin, Direct: 0.1 mg/dL (ref 0.0–0.3)
Total Protein: 7.3 g/dL (ref 6.0–8.3)

## 2014-05-24 LAB — LIPID PANEL
Cholesterol: 158 mg/dL (ref 0–200)
HDL: 52.4 mg/dL (ref >39.00)
LDL Cholesterol: 88 mg/dL (ref 0–99)
NonHDL: 105.6
Total CHOL/HDL Ratio: 3
Triglycerides: 88 mg/dL (ref 0.0–149.0)
VLDL: 17.6 mg/dL (ref 0.0–40.0)

## 2014-05-24 LAB — BASIC METABOLIC PANEL
BUN: 18 mg/dL (ref 6–23)
CO2: 27 mEq/L (ref 19–32)
Calcium: 9.5 mg/dL (ref 8.4–10.5)
Chloride: 106 mEq/L (ref 96–112)
Creatinine, Ser: 0.8 mg/dL (ref 0.4–1.2)
GFR: 78.17 mL/min (ref >60.00)
GLUCOSE: 88 mg/dL (ref 70–99)
POTASSIUM: 3.8 meq/L (ref 3.5–5.1)
SODIUM: 139 meq/L (ref 135–145)

## 2014-05-24 NOTE — Assessment & Plan Note (Signed)
The patient has not been experiencing any symptoms of CHF

## 2014-05-24 NOTE — Assessment & Plan Note (Signed)
The patient has not had any TIA symptoms.

## 2014-05-24 NOTE — Assessment & Plan Note (Signed)
Her symptoms of GERD had improved on Nexium and over-the-counter antacids

## 2014-05-24 NOTE — Patient Instructions (Signed)
Your physician recommends that you continue on your current medications as directed. Please refer to the Current Medication list given to you today.  Labs Today: Lipid, Hepatic, Bmet  Your physician recommends that you return for a FASTING lipid profile, hepatic, bmet in 4 months  Your physician wants you to follow-up in: 4 months with Dr.Brackbill You will receive a reminder letter in the mail two months in advance. If you don't receive a letter, please call our office to schedule the follow-up appointment.

## 2014-05-24 NOTE — Progress Notes (Signed)
Valerie [name] Date of Birth:  Dec 20, 1927 [facility] [address] [place], [place]  [address] [phone]        Fax   [phone]   History of Present Illness: This pleasant 80 year old woman is seen for a scheduled followup office visit. She has a past history of carotid artery disease and a past history of essential hypertension and a history of hypercholesterolemia..  She notes occasional fluttering of her heart. . She has a history of a left carotid bruit and is followed by Dr. Gae Gallop.  She was last seen in June 2015 which time her bilateral carotid artery stenoses were still less than 40% and she is now being checked every 18 months.  She complains of lack of energy.  Her PCP he recently started her on monthly vitamin B12 shots and she feels slightly improved.  On 02/12/14 she went to the emergency room with what she thought was chest pain.  She was told that it was from her GI tract.  Her cardiac workup was unremarkable.  She is on Nexium.  She states that since last visit she was told that her glaucoma is worsening.   Current Outpatient Prescriptions  Medication Sig Dispense Refill  . ALPRAZolam (XANAX) 0.25 MG tablet TAKE ONE TABLET BY MOUTH DAILY AS NEEDED  30 tablet  2  . amLODipine (NORVASC) 5 MG tablet Take 1 tablet (5 mg total) by mouth daily.  30 tablet  5  . aspirin 81 MG tablet Take 81 mg by mouth daily.        . Cyanocobalamin (VITAMIN B-12 IJ) Inject 1 Applicatorful as directed every 30 (thirty) days.      Marland Kitchen diltiazem (CARDIZEM CD) 120 MG 24 hr capsule TAKE ONE CAPSULE BY MOUTH ONE TIME DAILY  30 capsule  0  . esomeprazole (NEXIUM) 40 MG capsule Take 1 capsule (40 mg total) by mouth daily before breakfast.  30 capsule  0  . hydrochlorothiazide (HYDRODIURIL) 25 MG tablet Take 1 tablet (25 mg total) by mouth daily.  30 tablet  3  . HYDROcodone-acetaminophen (NORCO/VICODIN) 5-325 MG per tablet Take 1 tablet by mouth every 6 (six) hours as needed  for moderate pain.      Marland Kitchen latanoprost (XALATAN) 0.005 % ophthalmic solution Place 1 drop into both eyes at bedtime.      . metoprolol (LOPRESSOR) 50 MG tablet Take 50 mg by mouth 2 (two) times daily.      . nitroGLYCERIN (NITROSTAT) 0.4 MG SL tablet Place 1 tablet (0.4 mg total) under the tongue every 5 (five) minutes as needed for chest pain.  100 tablet  prn  . Potassium 75 MG TABS Take by mouth 2 (two) times daily.      . potassium chloride SA (K-DUR,KLOR-CON) 20 MEQ tablet Take 1 tablet (20 mEq total) by mouth 2 (two) times daily.  60 tablet  5  . pravastatin (PRAVACHOL) 40 MG tablet TAKE ONE TABLET BY MOUTH ONE TIME DAILY  30 tablet  4  . PRESCRIPTION MEDICATION Place 1 drop into both eyes every morning. Eye drop      . [DISCONTINUED] lisinopril (PRINIVIL,ZESTRIL) 20 MG tablet Take 1 tablet by mouth 1 day or 1 dose.       No current facility-administered medications for this visit.    Allergies  Allergen Reactions  . Lisinopril Other (See Comments)    Hair loss  . Sulfa Antibiotics Nausea Only    Patient Active Problem  List   Diagnosis Date Noted  . Chest pain radiating to arm 01/22/2014  . Occlusion and stenosis of carotid artery without mention of cerebral infarction 09/14/2012  . Malaise and fatigue 05/12/2012  . Internal and external hemorrhoids without complication 41/74/0814  . Abdominal pain 09/15/2011  . Vitamin B12 deficiency 09/15/2011  . Abnormal weight loss 09/15/2011  . Benign hypertensive heart disease without heart failure 05/22/2011  . Pure hypercholesterolemia 05/22/2011  . Carotid stenosis, asymptomatic 05/22/2011  . Dysphagia, unspecified 05/18/2011  . Stricture esophagus 05/18/2011  . Stricture of duodenum 05/18/2011  . Dysphagia 05/15/2011  . Family history of malignant neoplasm of gastrointestinal tract 05/15/2011  . Iron deficiency anemia, unspecified  05/15/2011  . Other general symptoms  05/15/2011  . GERD (gastroesophageal reflux disease)  05/15/2011  . BRBPR (bright red blood per rectum) 05/15/2011  . GERD 02/03/2008  . DIVERTICULOSIS-COLON 02/03/2008  . HEMOCCULT POSITIVE STOOL 02/03/2008    History  Smoking status  . Never Smoker   Smokeless tobacco  . Never Used    History  Alcohol Use No    Family History  Problem Relation Age of Onset  . Leukemia Mother   . Colon cancer Father 66  . Kidney cancer Brother   . Cancer Brother     bladder  . Stroke Sister   . Esophageal cancer Neg Hx   . Stomach cancer Neg Hx     Review of Systems: Constitutional: no fever chills diaphoresis or fatigue or change in weight.  Head and neck: no hearing loss, no epistaxis, no photophobia or visual disturbance. Respiratory: No cough, shortness of breath or wheezing. Cardiovascular: No chest pain peripheral edema, palpitations. Gastrointestinal: No abdominal distention, no abdominal pain, no change in bowel habits hematochezia or melena. Genitourinary: No dysuria, no frequency, no urgency, no nocturia. Musculoskeletal:No arthralgias, no back pain, no gait disturbance or myalgias. Neurological: No dizziness, no headaches, no numbness, no seizures, no syncope, no weakness, no tremors. Hematologic: No lymphadenopathy, no easy bruising. Psychiatric: No confusion, no hallucinations, no sleep disturbance.    Physical Exam: Filed Vitals:   05/24/14 0835  BP: 146/68  Pulse: 72   The patient appears to be in no distress.  Head and neck exam reveals that the pupils are equal and reactive.  The extraocular movements are full.  There is no scleral icterus.  Mouth and pharynx are benign.  No lymphadenopathy.  Bilateral soft carotid bruits. The jugular venous pressure is normal.  Thyroid is not enlarged or tender.  Chest is clear to percussion and auscultation.  No rales or rhonchi.  Expansion of the chest is symmetrical.  Heart reveals no abnormal lift or heave.  First and second heart sounds are normal.  There is no murmur  gallop rub or click.  The abdomen is soft and nontender.  Bowel sounds are normoactive.  There is no hepatosplenomegaly or mass.  There are no abdominal bruits.  Extremities reveal no phlebitis or edema.  Pedal pulses are good.  There is no cyanosis or clubbing.  Neurologic exam is normal strength and no lateralizing weakness.  No sensory deficits.  Integument reveals no rash   Assessment / Plan: 1. essential hypertension without heart failure 2. bilateral mild carotid artery stenosis. 3. Hypercholesterolemia 4. GERD 5. vitamin B12 deficiency  Disposition: We are getting labs today.  Recheck in 4 months for office visit lipid panel, hepatic function panel and basal metabolic panel

## 2014-05-25 ENCOUNTER — Other Ambulatory Visit: Payer: Self-pay | Admitting: *Deleted

## 2014-05-25 ENCOUNTER — Telehealth: Payer: Self-pay | Admitting: Cardiology

## 2014-05-25 DIAGNOSIS — E7800 Pure hypercholesterolemia, unspecified: Secondary | ICD-10-CM

## 2014-05-25 NOTE — Telephone Encounter (Signed)
Notified of lab results.  Advised to stop Pravastatin.  Will have repeat HFP on 9/25.

## 2014-05-25 NOTE — Telephone Encounter (Signed)
New message ° ° ° ° ° °Returning a nurses call from yesterday °

## 2014-06-13 ENCOUNTER — Other Ambulatory Visit (INDEPENDENT_AMBULATORY_CARE_PROVIDER_SITE_OTHER): Payer: Medicare PPO

## 2014-06-13 DIAGNOSIS — E7800 Pure hypercholesterolemia, unspecified: Secondary | ICD-10-CM

## 2014-06-13 DIAGNOSIS — E876 Hypokalemia: Secondary | ICD-10-CM

## 2014-06-14 LAB — BASIC METABOLIC PANEL
BUN: 20 mg/dL (ref 6–23)
CO2: 26 mEq/L (ref 19–32)
CREATININE: 0.9 mg/dL (ref 0.4–1.2)
Calcium: 9.5 mg/dL (ref 8.4–10.5)
Chloride: 106 mEq/L (ref 96–112)
GFR: 62.53 mL/min (ref >60.00)
Glucose, Bld: 95 mg/dL (ref 70–99)
Potassium: 3.8 mEq/L (ref 3.5–5.1)
SODIUM: 141 meq/L (ref 135–145)

## 2014-06-14 LAB — LIPID PANEL
Cholesterol: 217 mg/dL — ABNORMAL HIGH (ref 0–200)
HDL: 43.4 mg/dL (ref >39.00)
LDL Cholesterol: 154 mg/dL — ABNORMAL HIGH (ref 0–99)
NONHDL: 173.6
TRIGLYCERIDES: 100 mg/dL (ref 0.0–149.0)
Total CHOL/HDL Ratio: 5
VLDL: 20 mg/dL (ref 0.0–40.0)

## 2014-06-14 LAB — HEPATIC FUNCTION PANEL
ALBUMIN: 4 g/dL (ref 3.5–5.2)
ALK PHOS: 71 U/L (ref 39–117)
ALT: 13 U/L (ref 0–35)
AST: 22 U/L (ref 0–37)
Bilirubin, Direct: 0 mg/dL (ref 0.0–0.3)
TOTAL PROTEIN: 7.2 g/dL (ref 6.0–8.3)
Total Bilirubin: 0.7 mg/dL (ref 0.2–1.2)

## 2014-06-18 ENCOUNTER — Other Ambulatory Visit: Payer: Self-pay | Admitting: *Deleted

## 2014-06-18 MED ORDER — DILTIAZEM HCL ER COATED BEADS 120 MG PO CP24: ORAL_CAPSULE | ORAL | Status: DC

## 2014-06-19 ENCOUNTER — Ambulatory Visit: Payer: Medicare HMO

## 2014-06-19 ENCOUNTER — Other Ambulatory Visit: Payer: Self-pay | Admitting: *Deleted

## 2014-06-19 MED ORDER — PRAVASTATIN SODIUM 10 MG PO TABS
10.0000 mg | ORAL_TABLET | Freq: Every day | ORAL | Status: DC
Start: 2014-06-19 — End: 2015-07-10

## 2014-06-22 ENCOUNTER — Ambulatory Visit (INDEPENDENT_AMBULATORY_CARE_PROVIDER_SITE_OTHER): Payer: Medicare HMO | Admitting: *Deleted

## 2014-06-22 ENCOUNTER — Other Ambulatory Visit: Payer: Medicare PPO

## 2014-06-22 DIAGNOSIS — E538 Deficiency of other specified B group vitamins: Secondary | ICD-10-CM

## 2014-06-22 MED ORDER — CYANOCOBALAMIN 1000 MCG/ML IJ SOLN
1000.0000 ug | INTRAMUSCULAR | Status: AC
  Administered 2014-06-22 – 2015-05-20 (×10): 1000 ug via INTRAMUSCULAR

## 2014-06-22 NOTE — Progress Notes (Signed)
Patient ID: Valerie [name], female   DOB: Feb 24, 1928, 80 y.o.   MRN: [id number] Vitamin b12 given and tolerated well.

## 2014-06-22 NOTE — Patient Instructions (Signed)
Vitamin B12 Injections Every person needs vitamin B12. A deficiency develops when the body does not get enough of it. One way to overcome this is by getting B12 shots (injections). A B12 shot puts the vitamin directly into muscle tissue. This avoids any problems your body might have in absorbing it from food or a pill. In some people, the body has trouble using the vitamin correctly. This can cause a B12 deficiency. Not consuming enough of the vitamin can also cause a deficiency. Getting enough vitamin B12 can be hard for elderly people. Sometimes, they do not eat a well-balanced diet. The elderly are also more likely than younger people to have medical conditions or take medications that can lead to a deficiency. WHAT DOES VITAMIN B12 DO? Vitamin B12 does many things to help the body work right:  It helps the body make healthy red blood cells.  It helps maintain nerve cells.  It is involved in the body's process of converting food into energy (metabolism).  It is needed to make the genetic material in all cells (DNA). VITAMIN B12 FOOD SOURCES Most people get plenty of vitamin B12 through the foods they eat. It is present in:  Meat, fish, poultry, and eggs.  Milk and milk products.  It also is added when certain foods are made, including some breads, cereals and yogurts. The food is then called "fortified". CAUSES The most common causes of vitamin B12 deficiency are:  Pernicious anemia. The condition develops when the body cannot make enough healthy red blood cells. This stems from a lack of a protein made in the stomach (intrinsic factor). People without this protein cannot absorb enough vitamin B12 from food.  Malabsorption. This is when the body cannot absorb the vitamin. It can be caused by:  Pernicious anemia.  Surgery to remove part or all of the stomach can lead to malabsorption. Removal of part or all of the small intestine can also cause malabsorption.  Vegetarian diet.  People who are strict about not eating foods from animals could have trouble taking in enough vitamin B12 from diet alone.  Medications. Some medicines have been linked to B12 deficiency, such as Metformin (a drug prescribed for type 2 diabetes). Long-term use of stomach acid suppressants also can keep the vitamin from being absorbed.  Intestinal problems such as inflammatory bowel disease. If there are problems in the digestive tract, vitamin B12 may not be absorbed in good enough amounts. SYMPTOMS People who do not get enough B12 can develop problems. These can include:  Anemia. This is when the body has too few red blood cells. Red blood cells carry oxygen to the rest of the body. Without a healthy supply of red blood cells, people can feel:  Tired (fatigued).  Weak.  Severe anemia can cause:  Shortness of breath.  Dizziness.  Rapid heart rate.  Paleness.  Other Vitamin B12 deficiency symptoms include:  Diarrhea.  Numbness or tingling in the hands or feet.  Loss of appetite.  Confusion.  Sores on the tongue or in the mouth. LET YOUR CAREGIVER KNOW ABOUT:  Any allergies. It is very important to know if you are allergic or sensitive to cobalt. Vitamin B12 contains cobalt.  Any history of kidney disease.  All medications you are taking. Include prescription and over-the-counter medicines, herbs and creams.  Whether you are pregnant or breast-feeding.  If you have Leber's disease, a hereditary eye condition, vitamin B12 could make it worse. RISKS AND COMPLICATIONS Reactions to an injection are   usually temporary. They might include:  Pain at the injection site.  Redness, swelling or tenderness at the site.  Headache, dizziness or weakness.  Nausea, upset stomach or diarrhea.  Numbness or tingling.  Fever.  Joint pain.  Itching or rash. If a reaction does not go away in a short while, talk with your healthcare provider. A change in the way the shots are  given, or where they are given, might need to be made. BEFORE AN INJECTION To decide whether B12 injections are right for you, your healthcare provider will probably:  Ask about your medical history.  Ask questions about your diet.  Ask about symptoms such as:  Have you felt weak?  Do you feel unusually tired?  Do you get dizzy?  Order blood tests. These may include a test to:  Check the level of red cells in your blood.  Measure B12 levels.  Check for the presence of intrinsic factor. VITAMIN B12 INJECTIONS How often you will need a vitamin B12 injection will depend on how severe your deficiency is. This also will affect how long you will need to get them. People with pernicious anemia usually get injections for their entire life. Others might get them for a shorter period. For many people, injections are given daily or weekly for several weeks. Then, once B12 levels are normal, injections are given just once a month. If the cause of the deficiency can be fixed, the injections can be stopped. Talk with your healthcare provider about what you should expect. For an injection:  The injection site will be cleaned with an alcohol swab.  Your healthcare provider will insert a needle directly into a muscle. Most any muscle can be used. Most often, an arm muscle is used. A buttocks muscle can also be used. Many people say shots in that area are less painful.  A small adhesive bandage may be put over the injection site. It usually can be taken off in an hour or less. Injections can be given by your healthcare provider. In some cases, family members give them. Sometimes, people give them to themselves. Talk with your healthcare provider about what would be best for you. If someone other than your healthcare provider will be giving the shots, the person will need to be trained to give them correctly. HOME CARE INSTRUCTIONS   You can remove the adhesive bandage within an hour of getting a  shot.  You should be able to go about your normal activities right away.  Avoid drinking large amounts of alcohol while taking vitamin B12 shots. Alcohol can interfere with the body's use of the vitamin. SEEK MEDICAL CARE IF:   Pain, redness, swelling or tenderness at the injection site does not get better or gets worse.  Headache, dizziness or weakness does not go away.  You develop a fever of more than 100.5 F (38.1 C). SEEK IMMEDIATE MEDICAL CARE IF:   You have chest pain.  You develop shortness of breath.  You have muscle weakness that gets worse.  You develop numbness, weakness or tingling on one side or one area of the body.  You have symptoms of an allergic reaction, such as:  Hives.  Difficulty breathing.  Swelling of the lips, face, tongue or throat.  You develop a fever of more than 102.0 F (38.9 C). MAKE SURE YOU:   Understand these instructions.  Will watch your condition.  Will get help right away if you are not doing well or get worse. Document   Released: 12/11/2008 Document Revised: 12/07/2011 Document Reviewed: 12/11/2008 Exit[employer] Patient Information 2015 [employer]. This information is not intended to replace advice given to you by your health care provider. Make sure you discuss any questions you have with your health care provider.  

## 2014-07-26 ENCOUNTER — Ambulatory Visit: Payer: Medicare HMO

## 2014-07-27 ENCOUNTER — Ambulatory Visit (INDEPENDENT_AMBULATORY_CARE_PROVIDER_SITE_OTHER): Payer: Medicare HMO | Admitting: *Deleted

## 2014-07-27 DIAGNOSIS — E538 Deficiency of other specified B group vitamins: Secondary | ICD-10-CM

## 2014-07-27 NOTE — Patient Instructions (Signed)

## 2014-07-27 NOTE — Progress Notes (Signed)
Patient ID: Valerie [name], female   DOB: 1927-11-04, 80 y.o.   MRN: [id number] Pt given B12 injection IM left deltoid. Pt tolerated well.

## 2014-08-15 ENCOUNTER — Other Ambulatory Visit: Payer: Self-pay | Admitting: Cardiology

## 2014-09-03 ENCOUNTER — Other Ambulatory Visit: Payer: Self-pay | Admitting: Cardiology

## 2014-09-04 ENCOUNTER — Other Ambulatory Visit: Payer: Self-pay | Admitting: Cardiology

## 2014-09-05 ENCOUNTER — Ambulatory Visit (INDEPENDENT_AMBULATORY_CARE_PROVIDER_SITE_OTHER): Payer: Medicare HMO | Admitting: *Deleted

## 2014-09-05 DIAGNOSIS — E538 Deficiency of other specified B group vitamins: Secondary | ICD-10-CM

## 2014-09-05 NOTE — Progress Notes (Signed)
Vitamin b12 injection given and tolerated well.  

## 2014-09-05 NOTE — Patient Instructions (Signed)

## 2014-10-08 ENCOUNTER — Ambulatory Visit: Payer: Medicare HMO

## 2014-10-09 ENCOUNTER — Ambulatory Visit (INDEPENDENT_AMBULATORY_CARE_PROVIDER_SITE_OTHER): Payer: Medicare HMO | Admitting: *Deleted

## 2014-10-09 DIAGNOSIS — H31421 Serous choroidal detachment, right eye: Secondary | ICD-10-CM | POA: Diagnosis not present

## 2014-10-09 DIAGNOSIS — E538 Deficiency of other specified B group vitamins: Secondary | ICD-10-CM

## 2014-10-09 NOTE — Progress Notes (Signed)
Patient ID: Valerie [name], female   DOB: August 02, 1928, 81 y.o.   MRN: [id number] Pt tolerated inj well

## 2014-10-11 ENCOUNTER — Other Ambulatory Visit: Payer: Self-pay | Admitting: Cardiology

## 2014-10-12 ENCOUNTER — Telehealth: Payer: Self-pay | Admitting: Cardiology

## 2014-10-12 ENCOUNTER — Other Ambulatory Visit: Payer: Self-pay | Admitting: Cardiology

## 2014-10-12 NOTE — Telephone Encounter (Signed)
New message     Pt received pravastatin 10mg  today from the mail order pharmacy.  She usually takes 40mg  and they sent 10mg .  Has her medication been changed?

## 2014-10-12 NOTE — Telephone Encounter (Signed)
Spoke with patient and she stated she had been taking 10 mg daily since change after last labs

## 2014-10-24 ENCOUNTER — Other Ambulatory Visit: Payer: Self-pay | Admitting: Cardiology

## 2014-10-24 DIAGNOSIS — F419 Anxiety disorder, unspecified: Secondary | ICD-10-CM

## 2014-10-25 DIAGNOSIS — H4311 Vitreous hemorrhage, right eye: Secondary | ICD-10-CM | POA: Diagnosis not present

## 2014-10-30 DIAGNOSIS — H4311 Vitreous hemorrhage, right eye: Secondary | ICD-10-CM | POA: Diagnosis not present

## 2014-11-15 ENCOUNTER — Ambulatory Visit (INDEPENDENT_AMBULATORY_CARE_PROVIDER_SITE_OTHER): Payer: Medicare HMO | Admitting: *Deleted

## 2014-11-15 DIAGNOSIS — E538 Deficiency of other specified B group vitamins: Secondary | ICD-10-CM

## 2014-11-15 NOTE — Progress Notes (Signed)
Vitamin b12 injection given and tolerated well.  

## 2014-11-15 NOTE — Patient Instructions (Signed)

## 2014-11-28 DIAGNOSIS — H401433 Capsular glaucoma with pseudoexfoliation of lens, bilateral, severe stage: Secondary | ICD-10-CM | POA: Diagnosis not present

## 2014-12-03 ENCOUNTER — Other Ambulatory Visit: Payer: Self-pay | Admitting: Cardiology

## 2014-12-05 ENCOUNTER — Telehealth: Payer: Self-pay

## 2014-12-05 ENCOUNTER — Ambulatory Visit (INDEPENDENT_AMBULATORY_CARE_PROVIDER_SITE_OTHER): Payer: Medicare PPO | Admitting: Cardiology

## 2014-12-05 ENCOUNTER — Other Ambulatory Visit: Payer: Medicare PPO

## 2014-12-05 ENCOUNTER — Encounter: Payer: Self-pay | Admitting: Cardiology

## 2014-12-05 VITALS — BP 134/66 | HR 63 | Ht 64.0 in | Wt 115.6 lb

## 2014-12-05 DIAGNOSIS — E7800 Pure hypercholesterolemia, unspecified: Secondary | ICD-10-CM

## 2014-12-05 DIAGNOSIS — I119 Hypertensive heart disease without heart failure: Secondary | ICD-10-CM

## 2014-12-05 DIAGNOSIS — R5381 Other malaise: Secondary | ICD-10-CM | POA: Diagnosis not present

## 2014-12-05 DIAGNOSIS — R5383 Other fatigue: Secondary | ICD-10-CM | POA: Diagnosis not present

## 2014-12-05 DIAGNOSIS — R079 Chest pain, unspecified: Secondary | ICD-10-CM | POA: Diagnosis not present

## 2014-12-05 LAB — BASIC METABOLIC PANEL
BUN: 22 mg/dL (ref 6–23)
CALCIUM: 4.1 mg/dL — AB (ref 8.4–10.5)
CO2: 25 mEq/L (ref 19–32)
CREATININE: 0.7 mg/dL (ref 0.40–1.20)
Chloride: 103 mEq/L (ref 96–112)
GFR: 84.54 mL/min (ref >60.00)
GLUCOSE: 90 mg/dL (ref 70–99)
POTASSIUM: 8.2 meq/L — AB (ref 3.5–5.1)
Sodium: 140 mEq/L (ref 135–145)

## 2014-12-05 LAB — CBC WITH DIFFERENTIAL/PLATELET
BASOS ABS: 0 10*3/uL (ref 0.0–0.1)
Basophils Relative: 0.3 % (ref 0.0–3.0)
EOS ABS: 0.1 10*3/uL (ref 0.0–0.7)
Eosinophils Relative: 1.5 % (ref 0.0–5.0)
HEMATOCRIT: 43.2 % (ref 36.0–46.0)
HEMOGLOBIN: 15.1 g/dL — AB (ref 12.0–15.0)
Lymphocytes Relative: 31.3 % (ref 12.0–46.0)
Lymphs Abs: 2.3 10*3/uL (ref 0.7–4.0)
MCHC: 34.9 g/dL (ref 30.0–36.0)
MCV: 94.2 fl (ref 78.0–100.0)
MONO ABS: 0.4 10*3/uL (ref 0.1–1.0)
Monocytes Relative: 6.1 % (ref 3.0–12.0)
NEUTROS ABS: 4.5 10*3/uL (ref 1.4–7.7)
NEUTROS PCT: 60.8 % (ref 43.0–77.0)
PLATELETS: 293 10*3/uL (ref 150.0–400.0)
RBC: 4.59 Mil/uL (ref 3.87–5.11)
RDW: 13.3 % (ref 11.5–15.5)
WBC: 7.3 10*3/uL (ref 4.0–10.5)

## 2014-12-05 LAB — HEPATIC FUNCTION PANEL
ALBUMIN: 4.4 g/dL (ref 3.5–5.2)
ALK PHOS: 64 U/L (ref 39–117)
ALT: 14 U/L (ref 0–35)
AST: 25 U/L (ref 0–37)
BILIRUBIN TOTAL: 0.5 mg/dL (ref 0.2–1.2)
Bilirubin, Direct: 0.1 mg/dL (ref 0.0–0.3)
Total Protein: 7.7 g/dL (ref 6.0–8.3)

## 2014-12-05 LAB — LIPID PANEL
CHOL/HDL RATIO: 4
CHOLESTEROL: 206 mg/dL — AB (ref 0–200)
HDL: 55.3 mg/dL (ref >39.00)
LDL Cholesterol: 125 mg/dL — ABNORMAL HIGH (ref 0–99)
NonHDL: 150.7
Triglycerides: 130 mg/dL (ref 0.0–149.0)
VLDL: 26 mg/dL (ref 0.0–40.0)

## 2014-12-05 NOTE — Progress Notes (Signed)
Cardiology Office Note   Date:  12/05/2014   ID:  Valerie [name], DOB [birth year], MRN [id number]  PCP:  Redge Gainer, MD  Cardiologist:   Darlin Coco, MD   No chief complaint on file.     History of Present Illness: Valerie [name] is a 81 y.o. female who presents for a scheduled follow-up office visit.  This pleasant 81 year old woman is seen for a scheduled followup office visit. She has a past history of carotid artery disease and a past history of essential hypertension and a history of hypercholesterolemia.. She notes occasional fluttering of her heart.  She had a Myoview stress test on 01/25/14 which showed no ischemia.  The study was not gated. . She has a history of a left carotid bruit and is followed by Dr. Gae Gallop. She was last seen in June 2015 which time her bilateral carotid artery stenoses were still less than 40% and she is now being checked every 18 months. She complains of lack of energy. Her PCP he recently started her on monthly vitamin B12 shots and she feels slightly improved. On 02/12/14 she went to the emergency room with what she thought was chest pain. She was told that it was from her GI tract. Her cardiac workup was unremarkable. She is on Nexium. Since we last saw her she has had 3 operations at the [facility] in [facility].  The surgery was for glaucoma. Since last visit she has not been experiencing any exertional chest discomfort.  She did have one episode of chest pain at rest which awoke her from sleep.  She took an aspirin.  The discomfort lasted 3 minutes and then subsided and has not recurred.  Past Medical History  Diagnosis Date  . Hypertension   . Hyperlipidemia   . Carotid bruit     BILATERAL  . Palpitations   . Stricture and stenosis of esophagus   . Esophageal reflux   . Duodenitis without mention of hemorrhage   . Diverticulosis of colon with hemorrhage   . IBS (irritable bowel syndrome)   . Family history of  malignant neoplasm of gastrointestinal tract   . Arthritis   . Anxiety   . Cataract     rt cateract removed  . Depression   . Heart murmur   . Glaucoma     bil eyes  . Hiatal hernia   . UTI (urinary tract infection) March 2015  . Carotid artery occlusion     Past Surgical History  Procedure Laterality Date  . US echocardiography  06/21/2008    EF 55-60%  . US echocardiography  01/12/2003    EF 55-60%  . Dilation and curettage of uterus    . Right rotator cuff repair x1    . Left rotator cuff repair x2    . Cholecystectomy      Gall Bladder     Current Outpatient Prescriptions  Medication Sig Dispense Refill  . ALPRAZolam (XANAX) 0.25 MG tablet TAKE ONE TABLET BY MOUTH DAILY AS NEEDED 30 tablet 1  . amLODipine (NORVASC) 5 MG tablet TAKE 1 TABLET (5 MG TOTAL) BY MOUTH DAILY. 30 tablet 4  . aspirin 81 MG tablet Take 81 mg by mouth daily.      Marland Kitchen diltiazem (CARDIZEM CD) 120 MG 24 hr capsule TAKE ONE CAPSULE BY MOUTH ONE TIME DAILY 30 capsule 0  . esomeprazole (NEXIUM) 40 MG capsule Take 1 capsule (40 mg total) by mouth daily before breakfast.  30 capsule 0  . hydrochlorothiazide (HYDRODIURIL) 25 MG tablet TAKE ONE TABLET BY MOUTH ONE TIME DAILY 30 tablet 0  . HYDROcodone-acetaminophen (NORCO/VICODIN) 5-325 MG per tablet Take 1 tablet by mouth every 6 (six) hours as needed for moderate pain.    Marland Kitchen latanoprost (XALATAN) 0.005 % ophthalmic solution Place 1 drop into both eyes at bedtime.    . metoprolol (LOPRESSOR) 50 MG tablet TAKE ONE TABLET BY MOUTH THREE TIMES DAILY 90 tablet 2  . nitroGLYCERIN (NITROSTAT) 0.4 MG SL tablet Place 1 tablet (0.4 mg total) under the tongue every 5 (five) minutes as needed for chest pain. 100 tablet prn  . pravastatin (PRAVACHOL) 10 MG tablet Take 1 tablet (10 mg total) by mouth daily. 90 tablet 3  . PRESCRIPTION MEDICATION Place 1 drop into both eyes every morning. Eye drop    . [DISCONTINUED] lisinopril (PRINIVIL,ZESTRIL) 20 MG tablet Take 1 tablet by  mouth 1 day or 1 dose.     Current Facility-Administered Medications  Medication Dose Route Frequency Provider Last Rate Last Dose  . cyanocobalamin ((VITAMIN B-12)) injection 1,000 mcg  1,000 mcg Intramuscular Q30 days Mary-Margaret Hassell Done, FNP   1,000 mcg at 11/15/14 1022    Allergies:   Lisinopril and Sulfa antibiotics    Social History:  The patient  reports that she has never smoked. She has never used smokeless tobacco. She reports that she does not drink alcohol or use illicit drugs.   Family History:  The patient's family history includes Cancer in her brother; Colon cancer (age of onset: 65) in her father; Kidney cancer in her brother; Leukemia in her mother; Stroke in her sister. There is no history of Esophageal cancer or Stomach cancer.    ROS:  Please see the history of present illness.   Otherwise, review of systems are positive for none.   All other systems are reviewed and negative.    PHYSICAL EXAM: VS:  BP 134/66 mmHg  Pulse 63  Ht 5\' 4"  (1.626 m)  Wt 115 lb 9.6 oz (52.436 kg)  BMI 19.83 kg/m2 , BMI Body mass index is 19.83 kg/(m^2). GEN: Well nourished, well developed, in no acute distress HEENT: normal Neck: no JVD, soft left carotid bruit. Cardiac: RRR; no murmurs, rubs, or gallops,no edema  Respiratory:  clear to auscultation bilaterally, normal work of breathing GI: soft, nontender, nondistended, + BS MS: no deformity or atrophy Skin: warm and dry, no rash Neuro:  Strength and sensation are intact Psych: euthymic mood, full affect   EKG:  EKG is not ordered today.    Recent Labs: 02/12/2014: Hemoglobin 13.7; Platelets 274 06/13/2014: ALT 13; BUN 20; Creatinine 0.9; Potassium 3.8; Sodium 141    Lipid Panel    Component Value Date/Time   CHOL 217* 06/13/2014 0922   TRIG 100.0 06/13/2014 0922   HDL 43.40 06/13/2014 0922   CHOLHDL 5 06/13/2014 0922   VLDL 20.0 06/13/2014 0922   LDLCALC 154* 06/13/2014 0922      Wt Readings from Last 3  Encounters:  12/05/14 115 lb 9.6 oz (52.436 kg)  05/24/14 121 lb (54.885 kg)  03/14/14 118 lb (53.524 kg)         ASSESSMENT AND PLAN:  1. essential hypertension without heart failure 2. bilateral mild carotid artery stenosis. 3. Hypercholesterolemia 4. GERD 5. vitamin B12 deficiency 6.  Atypical chest pain.  Recent normal Myoview stress test April 2015  Disposition: We are getting labs today.Continue on current medication.  Recheck in 4 months for  office visit lipid panel, hepatic function panel and basal metabolic panel   Current medicines are reviewed at length with the patient today.  The patient does not have concerns regarding medicines.  The following changes have been made:  no change  Labs/ tests ordered today include: CBC, lipid panel, hepatic function panel, basal metabolic panel   Orders Placed This Encounter  Procedures  . Lipid panel  . Hepatic function panel  . Basic metabolic panel  . CBC with Differential/Platelet     Disposition:   FU with Dr. Mare Ferrari in 4 months for office visit and fasting lab work   Signed, Darlin Coco, MD  12/05/2014 12:47 PM    [address], [place], [place]  [address] Phone: [phone]; Fax: [phone]

## 2014-12-05 NOTE — Patient Instructions (Signed)
Will obtain labs today and call you with the results ([id number].CBC)  Your physician recommends that you continue on your current medications as directed. Please refer to the Current Medication list given to you today.  Your physician wants you to follow-up in: 4 months with fasting labs (lp/bmet/hfp)  You will receive a reminder letter in the mail two months in advance. If you don't receive a letter, please call our office to schedule the follow-up appointment.

## 2014-12-05 NOTE — Telephone Encounter (Signed)
Agree with plan 

## 2014-12-05 NOTE — Telephone Encounter (Signed)
Received critical result call from lab of pt. Potassium of 8.2.  Made MD aware of results suggested pt go to nearest Emergency room and have BMP rechecked.  Contacted pt, see feels fine, but is now worried after being told needs to have blood work rechecked.  Pt agreed to go to [facility] emergency room to have BMP rechecked.  [facility] Emergency Department, spoke with Izora Gala in ED, to let her know pt was coming to be evaluated for BMP.  She was going to keep eye out for pts arrival.  Bonnita Levan, our office's fax number and Dr. Sherryl Barters name to fax results.  Also informed her that our office closes at 5pm if she needs to contact us that there is an MD on call.

## 2015-01-01 ENCOUNTER — Ambulatory Visit (INDEPENDENT_AMBULATORY_CARE_PROVIDER_SITE_OTHER): Payer: Medicare PPO | Admitting: *Deleted

## 2015-01-01 DIAGNOSIS — E538 Deficiency of other specified B group vitamins: Secondary | ICD-10-CM

## 2015-01-01 NOTE — Progress Notes (Signed)
Vitamin b12 injection given and tolerated well.  

## 2015-01-09 ENCOUNTER — Other Ambulatory Visit: Payer: Self-pay | Admitting: Cardiology

## 2015-01-21 ENCOUNTER — Other Ambulatory Visit: Payer: Self-pay | Admitting: Cardiology

## 2015-02-01 ENCOUNTER — Ambulatory Visit (INDEPENDENT_AMBULATORY_CARE_PROVIDER_SITE_OTHER): Payer: Medicare PPO | Admitting: *Deleted

## 2015-02-01 DIAGNOSIS — E538 Deficiency of other specified B group vitamins: Secondary | ICD-10-CM | POA: Diagnosis not present

## 2015-02-01 NOTE — Patient Instructions (Signed)

## 2015-02-01 NOTE — Progress Notes (Signed)
Vitamin b12 injection given and tolerated well.  

## 2015-02-08 ENCOUNTER — Encounter: Payer: Self-pay | Admitting: Gastroenterology

## 2015-02-21 DIAGNOSIS — H35351 Cystoid macular degeneration, right eye: Secondary | ICD-10-CM | POA: Diagnosis not present

## 2015-02-27 DIAGNOSIS — H401433 Capsular glaucoma with pseudoexfoliation of lens, bilateral, severe stage: Secondary | ICD-10-CM | POA: Diagnosis not present

## 2015-03-05 ENCOUNTER — Ambulatory Visit: Payer: Medicare PPO

## 2015-03-11 ENCOUNTER — Ambulatory Visit (INDEPENDENT_AMBULATORY_CARE_PROVIDER_SITE_OTHER): Payer: Medicare PPO | Admitting: *Deleted

## 2015-03-11 DIAGNOSIS — E538 Deficiency of other specified B group vitamins: Secondary | ICD-10-CM

## 2015-03-11 NOTE — Patient Instructions (Signed)

## 2015-03-11 NOTE — Progress Notes (Signed)
Vitamin b12 injection given and tolerated well.  

## 2015-04-03 DIAGNOSIS — H401433 Capsular glaucoma with pseudoexfoliation of lens, bilateral, severe stage: Secondary | ICD-10-CM | POA: Diagnosis not present

## 2015-04-11 ENCOUNTER — Ambulatory Visit (INDEPENDENT_AMBULATORY_CARE_PROVIDER_SITE_OTHER): Payer: Medicare PPO | Admitting: *Deleted

## 2015-04-11 DIAGNOSIS — E538 Deficiency of other specified B group vitamins: Secondary | ICD-10-CM | POA: Diagnosis not present

## 2015-04-11 NOTE — Progress Notes (Signed)
Pt given B12 injection IM right deltoid and tolerated well. 

## 2015-04-11 NOTE — Patient Instructions (Signed)

## 2015-04-24 ENCOUNTER — Other Ambulatory Visit: Payer: Self-pay | Admitting: Cardiology

## 2015-04-25 ENCOUNTER — Other Ambulatory Visit: Payer: Self-pay

## 2015-04-25 DIAGNOSIS — H35351 Cystoid macular degeneration, right eye: Secondary | ICD-10-CM | POA: Diagnosis not present

## 2015-04-25 MED ORDER — AMLODIPINE BESYLATE 5 MG PO TABS: ORAL_TABLET | ORAL | Status: DC

## 2015-05-01 DIAGNOSIS — H401433 Capsular glaucoma with pseudoexfoliation of lens, bilateral, severe stage: Secondary | ICD-10-CM | POA: Diagnosis not present

## 2015-05-13 ENCOUNTER — Ambulatory Visit: Payer: Medicare PPO

## 2015-05-15 ENCOUNTER — Other Ambulatory Visit: Payer: Self-pay | Admitting: Cardiology

## 2015-05-16 DIAGNOSIS — Z7982 Long term (current) use of aspirin: Secondary | ICD-10-CM | POA: Diagnosis not present

## 2015-05-16 DIAGNOSIS — I10 Essential (primary) hypertension: Secondary | ICD-10-CM | POA: Diagnosis not present

## 2015-05-16 DIAGNOSIS — E0789 Other specified disorders of thyroid: Secondary | ICD-10-CM | POA: Diagnosis not present

## 2015-05-16 DIAGNOSIS — W010XXA Fall on same level from slipping, tripping and stumbling without subsequent striking against object, initial encounter: Secondary | ICD-10-CM | POA: Diagnosis not present

## 2015-05-16 DIAGNOSIS — S81812A Laceration without foreign body, left lower leg, initial encounter: Secondary | ICD-10-CM | POA: Diagnosis not present

## 2015-05-16 DIAGNOSIS — S0003XA Contusion of scalp, initial encounter: Secondary | ICD-10-CM | POA: Diagnosis not present

## 2015-05-16 DIAGNOSIS — M5030 Other cervical disc degeneration, unspecified cervical region: Secondary | ICD-10-CM | POA: Diagnosis not present

## 2015-05-16 DIAGNOSIS — S199XXA Unspecified injury of neck, initial encounter: Secondary | ICD-10-CM | POA: Diagnosis not present

## 2015-05-16 DIAGNOSIS — Z79899 Other long term (current) drug therapy: Secondary | ICD-10-CM | POA: Diagnosis not present

## 2015-05-16 DIAGNOSIS — S0990XA Unspecified injury of head, initial encounter: Secondary | ICD-10-CM | POA: Diagnosis not present

## 2015-05-20 ENCOUNTER — Ambulatory Visit (INDEPENDENT_AMBULATORY_CARE_PROVIDER_SITE_OTHER): Payer: Medicare PPO | Admitting: Family Medicine

## 2015-05-20 ENCOUNTER — Ambulatory Visit: Payer: Medicare PPO

## 2015-05-20 ENCOUNTER — Encounter: Payer: Self-pay | Admitting: Family Medicine

## 2015-05-20 VITALS — BP 151/50 | HR 57 | Temp 96.8°F | Ht 64.0 in | Wt 113.6 lb

## 2015-05-20 DIAGNOSIS — I119 Hypertensive heart disease without heart failure: Secondary | ICD-10-CM | POA: Diagnosis not present

## 2015-05-20 DIAGNOSIS — E7800 Pure hypercholesterolemia, unspecified: Secondary | ICD-10-CM

## 2015-05-20 DIAGNOSIS — E041 Nontoxic single thyroid nodule: Secondary | ICD-10-CM | POA: Diagnosis not present

## 2015-05-20 DIAGNOSIS — D509 Iron deficiency anemia, unspecified: Secondary | ICD-10-CM | POA: Diagnosis not present

## 2015-05-20 DIAGNOSIS — K219 Gastro-esophageal reflux disease without esophagitis: Secondary | ICD-10-CM

## 2015-05-20 DIAGNOSIS — E538 Deficiency of other specified B group vitamins: Secondary | ICD-10-CM

## 2015-05-20 NOTE — Progress Notes (Signed)
Subjective:  Patient ID: Valerie [name], female    DOB: 1927-11-28  Age: 81 y.o. MRN: [id number]  CC: thyroid check   HPI [name] presents for follow-up from emergency department. She had a fall. She tripped. She did not lose consciousness. However in the emergency department she underwent CT scanning of the head. They did not find any problem with the brain but saw a spot on the thyroid they recommended she have checked out. Patient denies any history of thyroid disease. The lesion was not known to her prior to the visit to the emergency department. The CT scan report was reviewed. Did show 1 cm nodule on the left. It was uncertain what the lesion represented. Therefore follow-up was recommended. She is here for that today. Patient has no history of myxedema. No history of excessive cold intolerance. She is cold natured and has always been that way worse with older years. She has not been losing hair. She has had constipation that is moderate and controlled with over-the-counter medicines periodically. Energy for age is fairly good.  follow-up of hypertension. Patient has no history of headache chest pain or shortness of breath or recent cough. Patient also denies symptoms of TIA such as numbness weakness lateralizing. Patient checks  blood pressure at home and has not had any elevated readings recently. Patient denies side effects from his medication. States taking it regularly. Patient in for follow-up of GERD. Currently asymptomatic taking  PPI daily. There is no chest pain or heartburn. No hematemesis and no melena. No dysphagia or choking. Onset is remote. Progression is stable. Complicating factors, none.   Patient also has a deficiency of vitamin B12 that is due to be checked. She also has been noted to have iron deficiency anemia. She denies any melena or hematochezia easy bruising. She is not currently taking iron.  Patient in for follow-up of elevated cholesterol. Doing well without  complaints on current medication. Denies side effects of statin including myalgia and arthralgia and nausea. Also in today for liver function testing. Currently no chest pain, shortness of breath or other cardiovascular related symptoms noted.  Marland KitchenHistory Camry has a past medical history of Hypertension; Hyperlipidemia; Carotid bruit; Palpitations; Stricture and stenosis of esophagus; Esophageal reflux; Duodenitis without mention of hemorrhage; Diverticulosis of colon with hemorrhage; IBS (irritable bowel syndrome); Family history of malignant neoplasm of gastrointestinal tract; Arthritis; Anxiety; Cataract; Depression; Heart murmur; Glaucoma; Hiatal hernia; UTI (urinary tract infection) (March 2015); and Carotid artery occlusion.   She has past surgical history that includes US ECHOCARDIOGRAPHY (06/21/2008); US ECHOCARDIOGRAPHY (01/12/2003); Dilation and curettage of uterus; RIGHT ROTATOR CUFF REPAIR X1; LEFT ROTATOR CUFF REPAIR X2; and Cholecystectomy.   Her family history includes Cancer in her brother; Colon cancer (age of onset: 74) in her father; Kidney cancer in her brother; Leukemia in her mother; Stroke in her sister. There is no history of Esophageal cancer or Stomach cancer.She reports that she has never smoked. She has never used smokeless tobacco. She reports that she does not drink alcohol or use illicit drugs.  Outpatient Prescriptions Prior to Visit  Medication Sig Dispense Refill  . ALPRAZolam (XANAX) 0.25 MG tablet TAKE ONE TABLET BY MOUTH DAILY AS NEEDED 30 tablet 1  . amLODipine (NORVASC) 5 MG tablet TAKE 1 TABLET (5 MG TOTAL) BY MOUTH DAILY. 30 tablet 11  . aspirin 81 MG tablet Take 81 mg by mouth daily.      Marland Kitchen diltiazem (CARDIZEM CD) 120 MG 24 hr capsule TAKE  ONE CAPSULE BY MOUTH ONE TIME DAILY 30 capsule 6  . esomeprazole (NEXIUM) 40 MG capsule Take 1 capsule (40 mg total) by mouth daily before breakfast. 30 capsule 0  . hydrochlorothiazide (HYDRODIURIL) 25 MG tablet TAKE ONE TABLET  BY MOUTH ONE TIME DAILY 30 tablet 6  . latanoprost (XALATAN) 0.005 % ophthalmic solution Place 1 drop into both eyes at bedtime.    . metoprolol (LOPRESSOR) 50 MG tablet TAKE ONE TABLET BY MOUTH THREE TIMES DAILY 90 tablet 0  . nitroGLYCERIN (NITROSTAT) 0.4 MG SL tablet Place 1 tablet (0.4 mg total) under the tongue every 5 (five) minutes as needed for chest pain. 100 tablet prn  . pravastatin (PRAVACHOL) 10 MG tablet Take 1 tablet (10 mg total) by mouth daily. 90 tablet 3  . PRESCRIPTION MEDICATION Place 1 drop into both eyes every morning. Eye drop    . HYDROcodone-acetaminophen (NORCO/VICODIN) 5-325 MG per tablet Take 1 tablet by mouth every 6 (six) hours as needed for moderate pain.     Facility-Administered Medications Prior to Visit  Medication Dose Route Frequency Provider Last Rate Last Dose  . cyanocobalamin ((VITAMIN B-12)) injection 1,000 mcg  1,000 mcg Intramuscular Q30 days Mary-Margaret Hassell Done, FNP   1,000 mcg at 05/20/15 1155    ROS Review of Systems  Constitutional: Negative for fever, chills, diaphoresis, appetite change, fatigue and unexpected weight change.  HENT: Negative for congestion, ear pain, hearing loss, postnasal drip, rhinorrhea, sneezing, sore throat and trouble swallowing.   Eyes: Negative for pain.  Respiratory: Negative for cough, chest tightness and shortness of breath.   Cardiovascular: Negative for chest pain and palpitations.  Gastrointestinal: Negative for nausea, vomiting, abdominal pain, diarrhea and constipation.  Genitourinary: Negative for dysuria, frequency and menstrual problem.  Musculoskeletal: Negative for joint swelling and arthralgias.  Skin: Negative for rash.  Neurological: Negative for dizziness, weakness, numbness and headaches.  Psychiatric/Behavioral: Negative for dysphoric mood and agitation.    Objective:  BP 151/50 mmHg  Pulse 57  Temp(Src) 96.8 F (36 C) (Oral)  Ht _0  (1.626 m)  Wt 113 lb 9.6 oz (51.529 kg)  BMI 19.49  kg/m2  BP Readings from Last 3 Encounters:  05/20/15 151/50  12/05/14 134/66  05/24/14 146/68    Wt Readings from Last 3 Encounters:  05/20/15 113 lb 9.6 oz (51.529 kg)  12/05/14 115 lb 9.6 oz (52.436 kg)  05/24/14 121 lb (54.885 kg)     Physical Exam  Constitutional: She is oriented to person, place, and time. She appears well-developed and well-nourished. No distress.  HENT:  Head: Normocephalic and atraumatic.  Right Ear: External ear normal.  Left Ear: External ear normal.  Nose: Nose normal.  Mouth/Throat: Oropharynx is clear and moist.  Eyes: Conjunctivae and EOM are normal. Pupils are equal, round, and reactive to light.  Neck: Normal range of motion. Neck supple. No JVD present. No tracheal deviation present. No thyromegaly (the nodule mentioned in the CT report is not palpable today.) present.  Cardiovascular: Normal rate, regular rhythm and normal heart sounds.   No murmur heard. Pulmonary/Chest: Effort normal and breath sounds normal. No respiratory distress. She has no wheezes. She has no rales.  Abdominal: Soft. Bowel sounds are normal. She exhibits no distension. There is no tenderness.  Lymphadenopathy:    She has no cervical adenopathy.  Neurological: She is alert and oriented to person, place, and time. She has normal reflexes.  Skin: Skin is warm and dry.  Psychiatric: She has a normal mood and  affect. Her behavior is normal. Judgment and thought content normal.    No results found for: HGBA1C  Lab Results  Component Value Date   WBC 8.4 05/20/2015   HGB 15.1* 12/05/2014   HCT 40.1 05/20/2015   PLT 293.0 12/05/2014   GLUCOSE 78 05/20/2015   CHOL 191 05/20/2015   TRIG 152* 05/20/2015   HDL 55 05/20/2015   LDLCALC 106* 05/20/2015   ALT 13 05/20/2015   AST 17 05/20/2015   NA 140 05/20/2015   K 4.4 05/20/2015   CL 98 05/20/2015   CREATININE 0.74 05/20/2015   BUN 19 05/20/2015   CO2 26 05/20/2015   TSH 1.150 05/20/2015   INR 1.1 11/30/2008     No results found.  Assessment & Plan:   Liah was seen today for thyroid check.  Diagnoses and all orders for this visit:  Vitamin B12 deficiency -     CMP14+EGFR  Benign hypertensive heart disease without heart failure -     CMP14+EGFR  Gastroesophageal reflux disease without esophagitis -     CMP14+EGFR -     CBC with Differential/Platelet  Iron deficiency anemia -     CMP14+EGFR -     CBC with Differential/Platelet  Thyroid nodule -     CMP14+EGFR -     Lipid panel -     CBC with Differential/Platelet -     Thyroid Panel With TSH -     T4, Free -     US Soft Tissue Head/Neck; Future  Pure hypercholesterolemia  Other orders -     CBC with Differential/Platelet   I have discontinued Ms. Fanelli's HYDROcodone-acetaminophen. I am also having her maintain her aspirin, esomeprazole, nitroGLYCERIN, latanoprost, PRESCRIPTION MEDICATION, pravastatin, ALPRAZolam, hydrochlorothiazide, diltiazem, amLODipine, and metoprolol. We administered cyanocobalamin. We will continue to administer cyanocobalamin.  No orders of the defined types were placed in this encounter.     Follow-up: Return in about 3 months (around 08/20/2015).  Claretta Fraise, M.D.

## 2015-05-21 LAB — CBC WITH DIFFERENTIAL/PLATELET
Basophils Absolute: 0 10*3/uL (ref 0.0–0.2)
Basos: 0 %
EOS (ABSOLUTE): 0.1 10*3/uL (ref 0.0–0.4)
Eos: 2 %
HEMATOCRIT: 40.1 % (ref 34.0–46.6)
HEMOGLOBIN: 14.3 g/dL (ref 11.1–15.9)
IMMATURE GRANS (ABS): 0 10*3/uL (ref 0.0–0.1)
Immature Granulocytes: 0 %
LYMPHS: 29 %
Lymphocytes Absolute: 2.5 10*3/uL (ref 0.7–3.1)
MCH: 33.1 pg — ABNORMAL HIGH (ref 26.6–33.0)
MCHC: 35.7 g/dL (ref 31.5–35.7)
MCV: 93 fL (ref 79–97)
MONOCYTES: 8 %
Monocytes Absolute: 0.7 10*3/uL (ref 0.1–0.9)
NEUTROS ABS: 5.1 10*3/uL (ref 1.4–7.0)
Neutrophils: 61 %
Platelets: 380 10*3/uL — ABNORMAL HIGH (ref 150–379)
RBC: 4.32 x10E6/uL (ref 3.77–5.28)
RDW: 12.7 % (ref 12.3–15.4)
WBC: 8.4 10*3/uL (ref 3.4–10.8)

## 2015-05-21 LAB — CMP14+EGFR
ALT: 13 IU/L (ref 0–32)
AST: 17 IU/L (ref 0–40)
Albumin/Globulin Ratio: 1.8 (ref 1.1–2.5)
Albumin: 4.6 g/dL (ref 3.5–4.7)
Alkaline Phosphatase: 87 IU/L (ref 39–117)
BUN / CREAT RATIO: 26 (ref 11–26)
BUN: 19 mg/dL (ref 8–27)
Bilirubin Total: 0.4 mg/dL (ref 0.0–1.2)
CO2: 26 mmol/L (ref 18–29)
CREATININE: 0.74 mg/dL (ref 0.57–1.00)
Calcium: 9.9 mg/dL (ref 8.7–10.3)
Chloride: 98 mmol/L (ref 97–108)
GFR calc non Af Amer: 74 mL/min/{1.73_m2} (ref >59)
GFR, EST AFRICAN AMERICAN: 85 mL/min/{1.73_m2} (ref >59)
GLUCOSE: 78 mg/dL (ref 65–99)
Globulin, Total: 2.5 g/dL (ref 1.5–4.5)
Potassium: 4.4 mmol/L (ref 3.5–5.2)
Sodium: 140 mmol/L (ref 134–144)
TOTAL PROTEIN: 7.1 g/dL (ref 6.0–8.5)

## 2015-05-21 LAB — THYROID PANEL WITH TSH
Free Thyroxine Index: 2.6 (ref 1.2–4.9)
T3 Uptake Ratio: 25 % (ref 24–39)
T4 TOTAL: 10.5 ug/dL (ref 4.5–12.0)
TSH: 1.15 u[IU]/mL (ref 0.450–4.500)

## 2015-05-21 LAB — LIPID PANEL
Chol/HDL Ratio: 3.5 ratio units (ref 0.0–4.4)
Cholesterol, Total: 191 mg/dL (ref 100–199)
HDL: 55 mg/dL (ref >39)
LDL CALC: 106 mg/dL — AB (ref 0–99)
Triglycerides: 152 mg/dL — ABNORMAL HIGH (ref 0–149)
VLDL CHOLESTEROL CAL: 30 mg/dL (ref 5–40)

## 2015-05-21 LAB — T4, FREE: Free T4: 1.38 ng/dL (ref 0.82–1.77)

## 2015-05-23 DIAGNOSIS — H35381 Toxic maculopathy, right eye: Secondary | ICD-10-CM | POA: Diagnosis not present

## 2015-06-06 ENCOUNTER — Ambulatory Visit (HOSPITAL_COMMUNITY)
Admission: RE | Admit: 2015-06-06 | Discharge: 2015-06-06 | Disposition: A | Discharge status: Home / Self Care | Payer: Medicare PPO | Source: Ambulatory Visit | Attending: Family Medicine | Admitting: Family Medicine

## 2015-06-06 DIAGNOSIS — E041 Nontoxic single thyroid nodule: Secondary | ICD-10-CM | POA: Insufficient documentation

## 2015-06-16 DIAGNOSIS — R1030 Lower abdominal pain, unspecified: Secondary | ICD-10-CM | POA: Diagnosis not present

## 2015-06-16 DIAGNOSIS — N343 Urethral syndrome, unspecified: Secondary | ICD-10-CM | POA: Diagnosis not present

## 2015-07-02 ENCOUNTER — Other Ambulatory Visit: Payer: Self-pay | Admitting: Cardiology

## 2015-07-02 ENCOUNTER — Ambulatory Visit (INDEPENDENT_AMBULATORY_CARE_PROVIDER_SITE_OTHER): Payer: Medicare PPO | Admitting: *Deleted

## 2015-07-02 DIAGNOSIS — E538 Deficiency of other specified B group vitamins: Secondary | ICD-10-CM | POA: Diagnosis not present

## 2015-07-02 MED ORDER — CYANOCOBALAMIN 1000 MCG/ML IJ SOLN
1000.0000 ug | INTRAMUSCULAR | Status: AC
  Administered 2015-07-02 – 2015-11-28 (×4): 1000 ug via INTRAMUSCULAR

## 2015-07-02 NOTE — Patient Instructions (Signed)

## 2015-07-02 NOTE — Progress Notes (Signed)
Pt given B12 injection IM right deltoid and tolerated well. 

## 2015-07-10 ENCOUNTER — Other Ambulatory Visit: Payer: Self-pay | Admitting: Cardiology

## 2015-07-22 ENCOUNTER — Other Ambulatory Visit: Payer: Self-pay | Admitting: *Deleted

## 2015-07-22 DIAGNOSIS — F419 Anxiety disorder, unspecified: Secondary | ICD-10-CM

## 2015-07-22 NOTE — Telephone Encounter (Signed)
Routing to MP due to wanting Alprazolam refill.Marland KitchenMarland Kitchen

## 2015-07-23 NOTE — Telephone Encounter (Signed)
Okay to refill alprazolam 

## 2015-07-23 NOTE — Telephone Encounter (Signed)
Okay to refill alpraz

## 2015-07-23 NOTE — Telephone Encounter (Signed)
Spoke with pharmacy and no rx filled in October Did ok times one only, additional refills from PCP

## 2015-07-25 MED ORDER — ALPRAZOLAM 0.25 MG PO TABS: 0.2500 mg | ORAL_TABLET | Freq: Every day | ORAL | Status: DC | PRN

## 2015-08-07 ENCOUNTER — Ambulatory Visit (INDEPENDENT_AMBULATORY_CARE_PROVIDER_SITE_OTHER): Payer: Medicare PPO | Admitting: *Deleted

## 2015-08-07 DIAGNOSIS — Z23 Encounter for immunization: Secondary | ICD-10-CM

## 2015-08-07 DIAGNOSIS — E538 Deficiency of other specified B group vitamins: Secondary | ICD-10-CM

## 2015-08-07 NOTE — Patient Instructions (Signed)

## 2015-08-07 NOTE — Progress Notes (Signed)
Pt given B12 injection IM left deltoid and tolerated well. °

## 2015-08-20 ENCOUNTER — Encounter: Payer: Self-pay | Admitting: Family Medicine

## 2015-08-20 ENCOUNTER — Ambulatory Visit (INDEPENDENT_AMBULATORY_CARE_PROVIDER_SITE_OTHER): Payer: Medicare PPO | Admitting: Family Medicine

## 2015-08-20 VITALS — BP 143/59 | HR 57 | Temp 96.9°F | Ht 64.0 in | Wt 116.2 lb

## 2015-08-20 DIAGNOSIS — I119 Hypertensive heart disease without heart failure: Secondary | ICD-10-CM | POA: Diagnosis not present

## 2015-08-20 DIAGNOSIS — E7800 Pure hypercholesterolemia, unspecified: Secondary | ICD-10-CM

## 2015-08-20 DIAGNOSIS — K219 Gastro-esophageal reflux disease without esophagitis: Secondary | ICD-10-CM

## 2015-08-20 DIAGNOSIS — H9201 Otalgia, right ear: Secondary | ICD-10-CM | POA: Diagnosis not present

## 2015-08-20 DIAGNOSIS — E538 Deficiency of other specified B group vitamins: Secondary | ICD-10-CM | POA: Diagnosis not present

## 2015-08-20 DIAGNOSIS — F4321 Adjustment disorder with depressed mood: Secondary | ICD-10-CM

## 2015-08-20 MED ORDER — METOPROLOL TARTRATE 100 MG PO TABS: 100.0000 mg | ORAL_TABLET | Freq: Two times a day (BID) | ORAL | Status: DC

## 2015-08-20 MED ORDER — TRAZODONE HCL 150 MG PO TABS: 150.0000 mg | ORAL_TABLET | Freq: Every day | ORAL | Status: DC

## 2015-08-20 NOTE — Progress Notes (Signed)
Subjective:  Patient ID: Valerie [name], female    DOB: Mar 17, 1928  Age: 81 y.o. MRN: [id number]  CC: Hyperlipidemia; Hypertension; and Gastroesophageal Reflux   HPI Valerie [name] presents for  follow-up of hypertension. Patient has no history of headache chest pain or shortness of breath or recent cough. Patient also denies symptoms of TIA such as numbness weakness lateralizing. Patient checks  blood pressure at home and has not had any elevated readings recently. Patient denies side effects from his medication. States taking it regularly.  Patient also  in for follow-up of elevated cholesterol. Doing well without complaints on current medication. Denies side effects of statin including myalgia and arthralgia and nausea. Also in today for liver function testing. Currently no chest pain, shortness of breath or other cardiovascular related symptoms noted.  Patient in for follow-up of GERD. Currently asymptomatic taking  PPI daily. There is no chest pain or heartburn. No hematemesis and no melena. No dysphagia or choking. Onset is remote. Progression is stable. Complicating factors, none. Avoiding foods that cause reflux. Using nexium.prn  Lost husband recently. Using xanax to help with sleep.   History Valerie [name] has a past medical history of Hypertension; Hyperlipidemia; Carotid bruit; Palpitations; Stricture and stenosis of esophagus; Esophageal reflux; Duodenitis without mention of hemorrhage; Diverticulosis of colon with hemorrhage; IBS (irritable bowel syndrome); Family history of malignant neoplasm of gastrointestinal tract; Arthritis; Anxiety; Cataract; Depression; Heart murmur; Glaucoma; Hiatal hernia; UTI (urinary tract infection) (March 2015); and Carotid artery occlusion.   She has past surgical history that includes US ECHOCARDIOGRAPHY (06/21/2008); US ECHOCARDIOGRAPHY (01/12/2003); Dilation and curettage of uterus; RIGHT ROTATOR CUFF REPAIR X1; LEFT ROTATOR CUFF REPAIR X2; and Cholecystectomy.     Her family history includes Cancer in her brother; Colon cancer (age of onset: 56) in her father; Kidney cancer in her brother; Leukemia in her mother; Stroke in her sister. There is no history of Esophageal cancer or Stomach cancer.She reports that she has never smoked. She has never used smokeless tobacco. She reports that she does not drink alcohol or use illicit drugs.  Current Outpatient Prescriptions on File Prior to Visit  Medication Sig Dispense Refill  . amLODipine (NORVASC) 5 MG tablet TAKE 1 TABLET (5 MG TOTAL) BY MOUTH DAILY. 30 tablet 11  . aspirin 81 MG tablet Take 81 mg by mouth daily.      Marland Kitchen diltiazem (CARDIZEM CD) 120 MG 24 hr capsule TAKE ONE CAPSULE BY MOUTH ONE TIME DAILY 30 capsule 6  . esomeprazole (NEXIUM) 40 MG capsule Take 1 capsule (40 mg total) by mouth daily before breakfast. 30 capsule 0  . hydrochlorothiazide (HYDRODIURIL) 25 MG tablet TAKE ONE TABLET BY MOUTH ONE TIME DAILY 30 tablet 6  . latanoprost (XALATAN) 0.005 % ophthalmic solution Place 1 drop into both eyes at bedtime.    . nitroGLYCERIN (NITROSTAT) 0.4 MG SL tablet Place 1 tablet (0.4 mg total) under the tongue every 5 (five) minutes as needed for chest pain. 100 tablet prn  . pravastatin (PRAVACHOL) 10 MG tablet TAKE ONE TABLET BY MOUTH ONE TIME DAILY 90 tablet 0  . PRESCRIPTION MEDICATION Place 1 drop into both eyes every morning. Eye drop    . [DISCONTINUED] lisinopril (PRINIVIL,ZESTRIL) 20 MG tablet Take 1 tablet by mouth 1 day or 1 dose.     Current Facility-Administered Medications on File Prior to Visit  Medication Dose Route Frequency Provider Last Rate Last Dose  . cyanocobalamin ((VITAMIN B-12)) injection 1,000 mcg  1,000 mcg Intramuscular Q30  days Mary-Margaret Hassell Done, FNP   1,000 mcg at 08/07/15 0957    ROS Review of Systems  Constitutional: Negative for fever, chills, diaphoresis, appetite change, fatigue and unexpected weight change.  HENT: Negative for congestion, ear pain, hearing  loss, postnasal drip, rhinorrhea, sneezing, sore throat and trouble swallowing.   Eyes: Negative for pain.  Respiratory: Negative for cough, chest tightness and shortness of breath.   Cardiovascular: Negative for chest pain and palpitations.  Gastrointestinal: Negative for nausea, vomiting, abdominal pain, diarrhea and constipation.  Genitourinary: Negative for dysuria, frequency and menstrual problem.  Musculoskeletal: Negative for joint swelling and arthralgias.  Skin: Negative for rash.  Neurological: Negative for dizziness, weakness, numbness and headaches.  Psychiatric/Behavioral: Negative for dysphoric mood and agitation.    Objective:  BP 143/59 mmHg  Pulse 57  Temp(Src) 96.9 F (36.1 C) (Oral)  Ht 5' 4"  (1.626 m)  Wt 116 lb 3.2 oz (52.708 kg)  BMI 19.94 kg/m2  SpO2 97%  BP Readings from Last 3 Encounters:  08/20/15 143/59  05/20/15 151/50  12/05/14 134/66    Wt Readings from Last 3 Encounters:  08/20/15 116 lb 3.2 oz (52.708 kg)  05/20/15 113 lb 9.6 oz (51.529 kg)  12/05/14 115 lb 9.6 oz (52.436 kg)     Physical Exam  Constitutional: She is oriented to person, place, and time. She appears well-developed and well-nourished. No distress.  HENT:  Head: Normocephalic and atraumatic.  Right Ear: External ear normal.  Left Ear: External ear normal.  Nose: Nose normal.  Mouth/Throat: Oropharynx is clear and moist.  Eyes: Conjunctivae and EOM are normal. Pupils are equal, round, and reactive to light.  Neck: Normal range of motion. Neck supple. No thyromegaly present.  Cardiovascular: Normal rate, regular rhythm and normal heart sounds.   No murmur heard. Pulmonary/Chest: Effort normal and breath sounds normal. No respiratory distress. She has no wheezes. She has no rales.  Abdominal: Soft. Bowel sounds are normal. She exhibits no distension. There is no tenderness.  Lymphadenopathy:    She has no cervical adenopathy.  Neurological: She is alert and oriented to  person, place, and time. She has normal reflexes.  Skin: Skin is warm and dry.  Psychiatric: She has a normal mood and affect. Her behavior is normal. Judgment and thought content normal.    No results found for: HGBA1C  Lab Results  Component Value Date   WBC 8.4 05/20/2015   HGB 15.1* 12/05/2014   HCT 40.1 05/20/2015   PLT 293.0 12/05/2014   GLUCOSE 78 05/20/2015   CHOL 191 05/20/2015   TRIG 152* 05/20/2015   HDL 55 05/20/2015   LDLCALC 106* 05/20/2015   ALT 13 05/20/2015   AST 17 05/20/2015   NA 140 05/20/2015   K 4.4 05/20/2015   CL 98 05/20/2015   CREATININE 0.74 05/20/2015   BUN 19 05/20/2015   CO2 26 05/20/2015   TSH 1.150 05/20/2015   INR 1.1 11/30/2008    US Soft Tissue Head/neck  06/06/2015  CLINICAL DATA:  Abnormal thyroid on recent CT of the cervical spine EXAM: THYROID ULTRASOUND TECHNIQUE: Ultrasound examination of the thyroid gland and adjacent soft tissues was performed. COMPARISON:  05/16/2015 FINDINGS: Right thyroid lobe Measurements: 5.3 x 1.5 x 1.3 cm. Mild inhomogeneity is identified. No focal nodules are identified. Left thyroid lobe Measurements: 4.7 x 1.6 x 1.5 cm. Mild inhomogeneity is seen. A predominately cystic nodule is noted in the midportion posteriorly measuring 11 mm in greatest dimension. This corresponds to the area  of abnormality seen on prior CT. Isthmus Thickness: 2 mm.  No nodules visualized. Lymphadenopathy None visualized. IMPRESSION: Predominately cystic appearing left thyroid nodule measuring 11 mm. Findings do not meet current SRU consensus criteria for biopsy. Follow-up by clinical exam is recommended. If patient has known risk factors for thyroid carcinoma, consider follow-up ultrasound in 12 months. If patient is clinically hyperthyroid, consider nuclear medicine thyroid uptake and scan.Reference: Management of Thyroid Nodules Detected at [place]: Society of Radiologists in [place]. Radiology 2005; [id number].  Electronically Signed   By: Inez Catalina M.D.   On: 06/06/2015 13:45    Assessment & Plan:   Valerie [name] was seen today for hyperlipidemia, hypertension and gastroesophageal reflux.  Diagnoses and all orders for this visit:  Benign hypertensive heart disease without heart failure -     BMP8+EGFR  Vitamin B12 deficiency -     Vitamin B12 -     BMP8+EGFR  Gastroesophageal reflux disease without esophagitis -     BMP8+EGFR  Pure hypercholesterolemia -     BMP8+EGFR  Otalgia of right ear -     BMP8+EGFR  Grief -     BMP8+EGFR  Other orders -     traZODone (DESYREL) 150 MG tablet; Take 1 tablet (150 mg total) by mouth at bedtime. -     metoprolol (LOPRESSOR) 100 MG tablet; Take 1 tablet (100 mg total) by mouth 2 (two) times daily.   I have discontinued Valerie [name]'s ALPRAZolam. I have also changed her metoprolol. Additionally, I am having her start on traZODone. Lastly, I am having her maintain her aspirin, esomeprazole, nitroGLYCERIN, latanoprost, PRESCRIPTION MEDICATION, hydrochlorothiazide, diltiazem, amLODipine, pravastatin, and timolol. We will continue to administer cyanocobalamin.  Meds ordered this encounter  Medications  . timolol (TIMOPTIC) 0.25 % ophthalmic solution    Sig: Place 1 drop into the left eye 2 (two) times daily.  . traZODone (DESYREL) 150 MG tablet    Sig: Take 1 tablet (150 mg total) by mouth at bedtime.    Dispense:  30 tablet    Refill:  2  . metoprolol (LOPRESSOR) 100 MG tablet    Sig: Take 1 tablet (100 mg total) by mouth 2 (two) times daily.    Dispense:  60 tablet    Refill:  5   Earwax removal:  Debrox drops are available without a prescription at your pharmacy.  Lay on your side with the ear up that you want to treat. Place for 5 drops of the Debrox in the ear canal and lay still for 15 minutes. After that time you considered up and allow the excess to run out of the year and catch it with a Kleenex. Repeat this with the other ear if  needed.  Repeat this process daily for 1 week. By that time the ear should feel less clogged and her hearing should be better, if not, follow up in the office for recheck of the ear.  Thanks, Claretta Fraise  Re: Metoprolol: Finish the bottle that you currently have by taking 2 tablets in the morning and 2 tablets in the evening until it is gone. After that start the new strength which will be one in the morning and 1 in the evening.  Follow-up: Return in about 3 months (around 11/20/2015).  Claretta Fraise, M.D.

## 2015-08-20 NOTE — Patient Instructions (Signed)
Earwax removal:  Debrox drops are available without a prescription at your pharmacy.  Lay on your side with the ear up that you want to treat. Place for 5 drops of the Debrox in the ear canal and lay still for 15 minutes. After that time you considered up and allow the excess to run out of the year and catch it with a Kleenex. Repeat this with the other ear if needed.  Repeat this process daily for 1 week. By that time the ear should feel less clogged and her hearing should be better, if not, follow up in the office for recheck of the ear.  Thanks, Valerie [name]  Re: Metoprolol: Finish the bottle that you currently have by taking 2 tablets in the morning and 2 tablets in the evening until it is gone. After that start the new strength which will be one in the morning and 1 in the evening.

## 2015-08-21 LAB — BMP8+EGFR
BUN / CREAT RATIO: 22 (ref 11–26)
BUN: 14 mg/dL (ref 8–27)
CALCIUM: 9.9 mg/dL (ref 8.7–10.3)
CHLORIDE: 101 mmol/L (ref 97–106)
CO2: 29 mmol/L (ref 18–29)
Creatinine, Ser: 0.64 mg/dL (ref 0.57–1.00)
GFR calc non Af Amer: 82 mL/min/{1.73_m2} (ref >59)
GFR, EST AFRICAN AMERICAN: 94 mL/min/{1.73_m2} (ref >59)
GLUCOSE: 84 mg/dL (ref 65–99)
POTASSIUM: 4 mmol/L (ref 3.5–5.2)
Sodium: 142 mmol/L (ref 136–144)

## 2015-08-21 LAB — VITAMIN B12: VITAMIN B 12: 874 pg/mL (ref 211–946)

## 2015-09-04 DIAGNOSIS — H35351 Cystoid macular degeneration, right eye: Secondary | ICD-10-CM | POA: Diagnosis not present

## 2015-09-04 DIAGNOSIS — H401113 Primary open-angle glaucoma, right eye, severe stage: Secondary | ICD-10-CM | POA: Diagnosis not present

## 2015-09-09 ENCOUNTER — Other Ambulatory Visit: Payer: Self-pay | Admitting: Cardiology

## 2015-09-18 ENCOUNTER — Ambulatory Visit: Payer: Medicare PPO | Admitting: Family

## 2015-09-18 ENCOUNTER — Other Ambulatory Visit (HOSPITAL_COMMUNITY): Payer: Medicare PPO

## 2015-10-04 ENCOUNTER — Encounter: Payer: Self-pay | Admitting: Family

## 2015-10-08 ENCOUNTER — Ambulatory Visit: Payer: Medicare PPO | Admitting: Family

## 2015-10-08 ENCOUNTER — Inpatient Hospital Stay (HOSPITAL_COMMUNITY)
Admission: RE | Admit: 2015-10-08 | Discharge: 2015-10-08 | Disposition: A | Discharge status: Home / Self Care | Payer: Medicare PPO | Source: Ambulatory Visit | Attending: Family | Admitting: Family

## 2015-10-08 DIAGNOSIS — I6523 Occlusion and stenosis of bilateral carotid arteries: Secondary | ICD-10-CM

## 2015-10-10 ENCOUNTER — Ambulatory Visit: Payer: Medicare PPO | Admitting: Family

## 2015-10-10 ENCOUNTER — Encounter (HOSPITAL_COMMUNITY): Payer: Medicare PPO

## 2015-10-11 ENCOUNTER — Encounter: Payer: Self-pay | Admitting: Family

## 2015-10-15 ENCOUNTER — Other Ambulatory Visit: Payer: Self-pay | Admitting: *Deleted

## 2015-10-15 DIAGNOSIS — I6523 Occlusion and stenosis of bilateral carotid arteries: Secondary | ICD-10-CM

## 2015-10-17 ENCOUNTER — Ambulatory Visit (HOSPITAL_COMMUNITY)
Admission: RE | Admit: 2015-10-17 | Discharge: 2015-10-17 | Disposition: A | Discharge status: Home / Self Care | Payer: Medicare Other | Source: Ambulatory Visit | Attending: Family | Admitting: Family

## 2015-10-17 ENCOUNTER — Ambulatory Visit (INDEPENDENT_AMBULATORY_CARE_PROVIDER_SITE_OTHER): Payer: Medicare Other | Admitting: Family

## 2015-10-17 ENCOUNTER — Encounter: Payer: Self-pay | Admitting: Family

## 2015-10-17 VITALS — BP 139/54 | HR 50 | Temp 96.9°F | Resp 14 | Ht 64.0 in | Wt 119.0 lb

## 2015-10-17 DIAGNOSIS — I6523 Occlusion and stenosis of bilateral carotid arteries: Secondary | ICD-10-CM | POA: Insufficient documentation

## 2015-10-17 NOTE — Progress Notes (Signed)
Chief Complaint: Extracranial Carotid Artery Stenosis   History of Present Illness  Valerie [name] is a 82 y.o. female patient of Dr. Scot Dock with mild carotid disease bilaterally. At her last visit Dr. Scot Dock indicated that it would be safe to stretch her followed up out to 18 months, she returns today for follow up.  Patient has not had previous carotid artery intervention.  She has no history of TIA or stroke symptoms.Specifically the patient denies a history of amaurosis fugax or monocular blindness, unilateral facial drooping, hemiparesis, or receptive or expressive aphasia.   She denies any claudication symptoms with walking, denies non healing wounds.  Pt denies New Medical or Surgical History.  Pt Diabetic: No Pt smoker: non-smoker She does not use ETOH, is not sedentary.  Pt meds include: Statin : Yes ASA: Yes Other anticoagulants/antiplatelets: no   Past Medical History  Diagnosis Date  . Hypertension   . Hyperlipidemia   . Carotid bruit     BILATERAL  . Palpitations   . Stricture and stenosis of esophagus   . Esophageal reflux   . Duodenitis without mention of hemorrhage   . Diverticulosis of colon with hemorrhage   . IBS (irritable bowel syndrome)   . Family history of malignant neoplasm of gastrointestinal tract   . Arthritis   . Anxiety   . Cataract     rt cateract removed  . Depression   . Heart murmur   . Glaucoma     bil eyes  . Hiatal hernia   . UTI (urinary tract infection) March 2015  . Carotid artery occlusion     Social History Social History  Substance Use Topics  . Smoking status: Never Smoker   . Smokeless tobacco: Never Used  . Alcohol Use: No    Family History Family History  Problem Relation Age of Onset  . Leukemia Mother   . Colon cancer Father 28  . Kidney cancer Brother   . Cancer Brother     bladder  . Stroke Sister   . Esophageal cancer Neg Hx   . Stomach cancer Neg Hx     Surgical History Past Surgical  History  Procedure Laterality Date  . US echocardiography  06/21/2008    EF 55-60%  . US echocardiography  01/12/2003    EF 55-60%  . Dilation and curettage of uterus    . Right rotator cuff repair x1    . Left rotator cuff repair x2    . Cholecystectomy      Gall Bladder    Allergies  Allergen Reactions  . Lisinopril Other (See Comments)    Hair loss  . Sulfa Antibiotics Nausea Only    Current Outpatient Prescriptions  Medication Sig Dispense Refill  . amLODipine (NORVASC) 5 MG tablet TAKE 1 TABLET (5 MG TOTAL) BY MOUTH DAILY. 30 tablet 11  . aspirin 81 MG tablet Take 81 mg by mouth daily.      Marland Kitchen diltiazem (CARDIZEM CD) 120 MG 24 hr capsule TAKE ONE CAPSULE BY MOUTH ONE TIME DAILY 30 capsule 1  . hydrochlorothiazide (HYDRODIURIL) 25 MG tablet TAKE ONE TABLET BY MOUTH ONE TIME DAILY 30 tablet 6  . latanoprost (XALATAN) 0.005 % ophthalmic solution Place 1 drop into both eyes at bedtime.    . metoprolol (LOPRESSOR) 100 MG tablet Take 1 tablet (100 mg total) by mouth 2 (two) times daily. 60 tablet 5  . nitroGLYCERIN (NITROSTAT) 0.4 MG SL tablet Place 1 tablet (0.4 mg total) under the  tongue every 5 (five) minutes as needed for chest pain. 100 tablet prn  . pravastatin (PRAVACHOL) 10 MG tablet TAKE ONE TABLET BY MOUTH ONE TIME DAILY 90 tablet 0  . PRESCRIPTION MEDICATION Place 1 drop into both eyes every morning. Eye drop    . timolol (TIMOPTIC) 0.25 % ophthalmic solution Place 1 drop into the left eye 2 (two) times daily.    Marland Kitchen esomeprazole (NEXIUM) 40 MG capsule Take 1 capsule (40 mg total) by mouth daily before breakfast. (Patient not taking: Reported on 10/17/2015) 30 capsule 0  . traZODone (DESYREL) 150 MG tablet Take 1 tablet (150 mg total) by mouth at bedtime. (Patient not taking: Reported on 10/17/2015) 30 tablet 2  . [DISCONTINUED] lisinopril (PRINIVIL,ZESTRIL) 20 MG tablet Take 1 tablet by mouth 1 day or 1 dose.     Current Facility-Administered Medications  Medication Dose  Route Frequency Provider Last Rate Last Dose  . cyanocobalamin ((VITAMIN B-12)) injection 1,000 mcg  1,000 mcg Intramuscular Q30 days Mary-Margaret Hassell Done, FNP   1,000 mcg at 08/07/15 0957    Review of Systems : See HPI for pertinent positives and negatives.  Physical Examination  Filed Vitals:   10/17/15 1007 10/17/15 1011  BP: 125/56 139/54  Pulse: 50 50  Temp: 96.9 F (36.1 C)   TempSrc: Oral   Resp: 14   Height: 5\' 4"  (1.626 m)   Weight: 119 lb (53.978 kg)   SpO2: 98%    Body mass index is 20.42 kg/(m^2).  General: WDWN female in NAD GAIT: normal Eyes: PERRLA Pulmonary: Non-labored, CTAB, no rales,  rhonchi, or wheezing.  Cardiac: regular rhythm,no detected murmur.  VASCULAR EXAM Carotid Bruits Left Right   Positive, prominent  Positive, moderate   Aorta is not palpable. Radial pulses are 2+ palpable and equal.      LE Pulses LEFT RIGHT   POPLITEAL not palpable  not palpable   POSTERIOR TIBIAL not palpable  2+ palpable    DORSALIS PEDIS  ANTERIOR TIBIAL 2+ palpable  1+ palpable     Gastrointestinal: soft, nontender, BS WNL, no r/g, no paplable masses.  Musculoskeletal: No muscle atrophy/wasting. M/S 5/5 throughout, Extremities without ischemic changes, bunions.  Neurologic: A&O X 3; Appropriate Affect, Speech is normal CN 2-12 intact, Pain and light touch intact in extremities, Motor exam as listed above.           Non-Invasive Vascular Imaging CAROTID DUPLEX 10/17/2015   1-39% bilateral proximal ICA stenosis. No significant changed compared to exam of 03/14/14.   Assessment: Valerie [name] is a 82 y.o. female who has no history of stroke or TIA. Today's carotid duplex suggests 1-39% bilateral proximal ICA stenosis. No significant changed compared to exam of 03/14/14.    Fortunately she does not have DM, has never used tobacco, does not use ETOH, and is not sedentary.  She takes ASA and a statin on a regular basis.   Plan: Follow-up in 2 years with Carotid Duplex scan.   I discussed in depth with the patient the nature of atherosclerosis, and emphasized the importance of maximal medical management including strict control of blood pressure, blood glucose, and lipid levels, obtaining regular exercise, and continued cessation of smoking.  The patient is aware that without maximal medical management the underlying atherosclerotic disease process will progress, limiting the benefit of any interventions. The patient was given information about stroke prevention and what symptoms should prompt the patient to seek immediate medical care. Thank you for allowing [place] to participate in  this patient's care.  Clemon Chambers, RN, MSN, FNP-C [facility] Office: [phone]  Clinic Physician: Oneida Alar  10/17/2015 10:20 AM

## 2015-10-17 NOTE — Patient Instructions (Signed)
Stroke Prevention Some medical conditions and behaviors are associated with an increased chance of having a stroke. You may prevent a stroke by making healthy choices and managing medical conditions. HOW CAN I REDUCE MY RISK OF HAVING A STROKE?   Stay physically active. Get at least 30 minutes of activity on most or all days.  Do not smoke. It may also be helpful to avoid exposure to secondhand smoke.  Limit alcohol use. Moderate alcohol use is considered to be:  No more than 2 drinks per day for men.  No more than 1 drink per day for nonpregnant women.  Eat healthy foods. This involves:  Eating 5 or more servings of fruits and vegetables a day.  Making dietary changes that address high blood pressure (hypertension), high cholesterol, diabetes, or obesity.  Manage your cholesterol levels.  Making food choices that are high in fiber and low in saturated fat, trans fat, and cholesterol may control cholesterol levels.  Take any prescribed medicines to control cholesterol as directed by your health care provider.  Manage your diabetes.  Controlling your carbohydrate and sugar intake is recommended to manage diabetes.  Take any prescribed medicines to control diabetes as directed by your health care provider.  Control your hypertension.  Making food choices that are low in salt (sodium), saturated fat, trans fat, and cholesterol is recommended to manage hypertension.  Ask your health care provider if you need treatment to lower your blood pressure. Take any prescribed medicines to control hypertension as directed by your health care provider.  If you are 18-39 years of age, have your blood pressure checked every 3-5 years. If you are 40 years of age or older, have your blood pressure checked every year.  Maintain a healthy weight.  Reducing calorie intake and making food choices that are low in sodium, saturated fat, trans fat, and cholesterol are recommended to manage  weight.  Stop drug abuse.  Avoid taking birth control pills.  Talk to your health care provider about the risks of taking birth control pills if you are over 35 years old, smoke, get migraines, or have ever had a blood clot.  Get evaluated for sleep disorders (sleep apnea).  Talk to your health care provider about getting a sleep evaluation if you snore a lot or have excessive sleepiness.  Take medicines only as directed by your health care provider.  For some people, aspirin or blood thinners (anticoagulants) are helpful in reducing the risk of forming abnormal blood clots that can lead to stroke. If you have the irregular heart rhythm of atrial fibrillation, you should be on a blood thinner unless there is a good reason you cannot take them.  Understand all your medicine instructions.  Make sure that other conditions (such as anemia or atherosclerosis) are addressed. SEEK IMMEDIATE MEDICAL CARE IF:   You have sudden weakness or numbness of the face, arm, or leg, especially on one side of the body.  Your face or eyelid droops to one side.  You have sudden confusion.  You have trouble speaking (aphasia) or understanding.  You have sudden trouble seeing in one or both eyes.  You have sudden trouble walking.  You have dizziness.  You have a loss of balance or coordination.  You have a sudden, severe headache with no known cause.  You have new chest pain or an irregular heartbeat. Any of these symptoms may represent a serious problem that is an emergency. Do not wait to see if the symptoms will   go away. Get medical help at once. Call your local emergency services (911 in [place] Do not drive yourself to the hospital.   This information is not intended to replace advice given to you by your health care provider. Make sure you discuss any questions you have with your health care provider.   Document Released: 10/22/2004 Document Revised: 10/05/2014 Document Reviewed:  03/17/2013 Elsevier Interactive Patient Education 2016 Elsevier Inc.  

## 2015-10-21 ENCOUNTER — Other Ambulatory Visit: Payer: Self-pay | Admitting: Cardiology

## 2015-10-21 DIAGNOSIS — F419 Anxiety disorder, unspecified: Secondary | ICD-10-CM

## 2015-10-24 ENCOUNTER — Ambulatory Visit (INDEPENDENT_AMBULATORY_CARE_PROVIDER_SITE_OTHER): Payer: 59 | Admitting: *Deleted

## 2015-10-24 ENCOUNTER — Other Ambulatory Visit: Payer: 59

## 2015-10-24 DIAGNOSIS — E538 Deficiency of other specified B group vitamins: Secondary | ICD-10-CM | POA: Diagnosis not present

## 2015-10-24 NOTE — Patient Instructions (Signed)

## 2015-10-24 NOTE — Progress Notes (Signed)
Vitamin b12 injection given and patient tolerated well.  

## 2015-10-25 ENCOUNTER — Other Ambulatory Visit: Payer: Self-pay | Admitting: *Deleted

## 2015-10-25 DIAGNOSIS — F419 Anxiety disorder, unspecified: Secondary | ICD-10-CM

## 2015-11-15 ENCOUNTER — Ambulatory Visit (INDEPENDENT_AMBULATORY_CARE_PROVIDER_SITE_OTHER): Payer: Medicare Other | Admitting: Cardiology

## 2015-11-15 ENCOUNTER — Encounter: Payer: Self-pay | Admitting: Cardiology

## 2015-11-15 VITALS — BP 142/60 | HR 46 | Ht 64.5 in | Wt 114.1 lb

## 2015-11-15 DIAGNOSIS — I6523 Occlusion and stenosis of bilateral carotid arteries: Secondary | ICD-10-CM | POA: Diagnosis not present

## 2015-11-15 DIAGNOSIS — R001 Bradycardia, unspecified: Secondary | ICD-10-CM

## 2015-11-15 DIAGNOSIS — I119 Hypertensive heart disease without heart failure: Secondary | ICD-10-CM | POA: Diagnosis not present

## 2015-11-15 DIAGNOSIS — J019 Acute sinusitis, unspecified: Secondary | ICD-10-CM | POA: Insufficient documentation

## 2015-11-15 DIAGNOSIS — J0100 Acute maxillary sinusitis, unspecified: Secondary | ICD-10-CM | POA: Diagnosis not present

## 2015-11-15 MED ORDER — METOPROLOL TARTRATE 50 MG PO TABS: 50.0000 mg | ORAL_TABLET | Freq: Two times a day (BID) | ORAL | Status: DC

## 2015-11-15 MED ORDER — AZITHROMYCIN 250 MG PO TABS: ORAL_TABLET | ORAL | Status: DC

## 2015-11-15 NOTE — Progress Notes (Signed)
Cardiology Office Note   Date:  11/15/2015   ID:  Valerie [name], DOB 02/01/1928, MRN [id number]  PCP:  Claretta Fraise, MD  Cardiologist: Darlin Coco MD  Chief Complaint  Patient presents with  . scheduled follow up      History of Present Illness: Valerie [name] is a 82 y.o. female who presents for a scheduled follow-up visit.  This pleasant 82 year old woman is seen for a scheduled followup office visit.  She is a recent widow.  Her husband who was our patient died of complications of a stroke about 4 months ago.  She has a past history of carotid artery disease and a past history of essential hypertension and a history of hypercholesterolemia.. She notes occasional fluttering of her heart. She had a Myoview stress test on 01/25/14 which showed no ischemia. The study was not gated. . She has a history of a left carotid bruit and is followed by Dr. Gae Gallop. She had recent carotid Dopplers on 10/17/15 which did not show any progression of disease.  She complains of lack of energy. Her PCP he recently started her on monthly vitamin B12 shots and she feels slightly improved. On 02/12/14 she went to the emergency room with what she thought was chest pain. She was told that it was from her GI tract. Her cardiac workup was unremarkable. She is on Nexium. Since we last saw her she has had 3 operations at the [facility] in [facility]. The surgery was for glaucoma. Since last visit she has not been experiencing any exertional chest discomfort. She did have one episode of chest pain at rest which awoke her from sleep. She took an aspirin. The discomfort lasted 3 minutes and then subsided and has not recurred. Earlier this week she had a flulike syndrome which left her feeling very weak.  She is now having symptoms of acute sinusitis with thick nasal drainage tinged with some blood.  He is requesting an antibiotic. Has not had to take any sublingual nitroglycerin since  last visit.  Past Medical History  Diagnosis Date  . Hypertension   . Hyperlipidemia   . Carotid bruit     BILATERAL  . Palpitations   . Stricture and stenosis of esophagus   . Esophageal reflux   . Duodenitis without mention of hemorrhage   . Diverticulosis of colon with hemorrhage   . IBS (irritable bowel syndrome)   . Family history of malignant neoplasm of gastrointestinal tract   . Arthritis   . Anxiety   . Cataract     rt cateract removed  . Depression   . Heart murmur   . Glaucoma     bil eyes  . Hiatal hernia   . UTI (urinary tract infection) March 2015  . Carotid artery occlusion     Past Surgical History  Procedure Laterality Date  . US echocardiography  06/21/2008    EF 55-60%  . US echocardiography  01/12/2003    EF 55-60%  . Dilation and curettage of uterus    . Right rotator cuff repair x1    . Left rotator cuff repair x2    . Cholecystectomy      Gall Bladder     Current Outpatient Prescriptions  Medication Sig Dispense Refill  . ALPRAZolam (XANAX) 0.25 MG tablet Take 0.25 mg by mouth at bedtime as needed for sleep.    Marland Kitchen amLODipine (NORVASC) 5 MG tablet TAKE 1 TABLET (5 MG TOTAL) BY MOUTH DAILY.  30 tablet 11  . aspirin 81 MG tablet Take 81 mg by mouth daily.      Marland Kitchen diltiazem (CARDIZEM CD) 120 MG 24 hr capsule TAKE ONE CAPSULE BY MOUTH ONE TIME DAILY 30 capsule 1  . esomeprazole (NEXIUM) 40 MG capsule Take 1 capsule (40 mg total) by mouth daily before breakfast. 30 capsule 0  . guaiFENesin (MUCINEX) 600 MG 12 hr tablet Take by mouth 2 (two) times daily as needed for cough or to loosen phlegm.    . hydrochlorothiazide (HYDRODIURIL) 25 MG tablet TAKE ONE TABLET BY MOUTH ONE TIME DAILY 30 tablet 5  . latanoprost (XALATAN) 0.005 % ophthalmic solution Place 1 drop into both eyes at bedtime.    . metoprolol (LOPRESSOR) 50 MG tablet Take 1 tablet (50 mg total) by mouth 2 (two) times daily. 60 tablet 5  . nitroGLYCERIN (NITROSTAT) 0.4 MG SL tablet Place 1  tablet (0.4 mg total) under the tongue every 5 (five) minutes as needed for chest pain. 100 tablet prn  . pravastatin (PRAVACHOL) 10 MG tablet TAKE ONE TABLET BY MOUTH ONE TIME DAILY 90 tablet 1  . PRESCRIPTION MEDICATION Place 1 drop into both eyes every morning. Eye drop    . timolol (TIMOPTIC) 0.25 % ophthalmic solution Place 1 drop into the left eye 2 (two) times daily.    Marland Kitchen azithromycin (ZITHROMAX) 250 MG tablet 2 TABLETS BY MOUTH ON DAY 1 AND THEN 1 TABLET DAILY UNTIL FINISHED 6 each 0  . [DISCONTINUED] lisinopril (PRINIVIL,ZESTRIL) 20 MG tablet Take 1 tablet by mouth 1 day or 1 dose.     Current Facility-Administered Medications  Medication Dose Route Frequency Provider Last Rate Last Dose  . cyanocobalamin ((VITAMIN B-12)) injection 1,000 mcg  1,000 mcg Intramuscular Q30 days Mary-Margaret Hassell Done, FNP   1,000 mcg at 10/24/15 [id number]    Allergies:   Lisinopril and Sulfa antibiotics    Social History:  The patient  reports that she has never smoked. She has never used smokeless tobacco. She reports that she does not drink alcohol or use illicit drugs.   Family History:  The patient's family history includes Cancer in her brother; Colon cancer (age of onset: 21) in her father; Kidney cancer in her brother; Leukemia in her mother; Stroke in her sister. There is no history of Esophageal cancer or Stomach cancer.    ROS:  Please see the history of present illness.   Otherwise, review of systems are positive for none.   All other systems are reviewed and negative.    PHYSICAL EXAM: VS:  BP 142/60 mmHg  Pulse 46  Ht 5' 4.5" (1.638 m)  Wt 114 lb 1.9 oz (51.764 kg)  BMI 19.29 kg/m2 , BMI Body mass index is 19.29 kg/(m^2). GEN: Well nourished, well developed, in no acute distress HEENT: normal Neck: no JVD, there are soft bilateral carotid bruits.  or masses Cardiac: RRR; no murmurs, rubs, or gallops,no edema  Respiratory:  clear to auscultation bilaterally, normal work of breathing GI:  soft, nontender, nondistended, + BS MS: no deformity or atrophy Skin: warm and dry, no rash Neuro:  Strength and sensation are intact Psych: euthymic mood, full affect   EKG:  EKG is ordered today. The ekg ordered today demonstrates sinus bradycardia at 47 bpm.  First degree AV block.  Since last tracing of 02/12/14, heart rate is slower.   Recent Labs: 12/05/2014: Hemoglobin 15.1* 05/20/2015: ALT 13; Platelets 380*; TSH 1.150 08/20/2015: BUN 14; Creatinine, Ser 0.64; Potassium 4.0; Sodium  142    Lipid Panel    Component Value Date/Time   CHOL 191 05/20/2015 1234   CHOL 206* 12/05/2014 0946   TRIG 152* 05/20/2015 1234   HDL 55 05/20/2015 1234   HDL 55.30 12/05/2014 0946   CHOLHDL 3.5 05/20/2015 1234   CHOLHDL 4 12/05/2014 0946   VLDL 26.0 12/05/2014 0946   LDLCALC 106* 05/20/2015 1234   LDLCALC 125* 12/05/2014 0946      Wt Readings from Last 3 Encounters:  11/15/15 114 lb 1.9 oz (51.764 kg)  10/17/15 119 lb (53.978 kg)  08/20/15 116 lb 3.2 oz (52.708 kg)         ASSESSMENT AND PLAN:  1. essential hypertension without heart failure 2. bilateral mild carotid artery stenosis.  Carotid Dopplers on 10/17/15 were updated and show less than 40% stenosis bilaterally 3. Hypercholesterolemia 4. GERD 5. vitamin B12 deficiency 6. Atypical chest pain. Normal Myoview stress test April 2015 7.  Acute sinusitis   Current medicines are reviewed at length with the patient today.  The patient has concerns regarding medicines.  The following changes have been made:  Because of her bradycardia we are reducing her metoprolol to just 50 mg metoprolol tartrate twice a day  Labs/ tests ordered today include:   Orders Placed This Encounter  Procedures  . EKG 12-Lead     Disposition:  We gave her a Zithromax Z-Pak and advised her to take some plain Mucinex for her sinusitis.  We reduced her metoprolol today.  Following my retirement she will be rechecked in 4 months in [place] with  Dr. Domenic Polite  Signed, Darlin Coco MD 11/15/2015 2:01 PM    [address], [place], [place]  [address] Phone: [phone]; Fax: [phone]

## 2015-11-15 NOTE — Patient Instructions (Signed)
Medication Instructions:  START A ZPAK 2 TABLETS BY MOUTH ON DAY 1 AND THEN 1 DAILY UNTIL FINISHED  START MUCINEX PLAIN TWICE A DAY AS NEEDED FOR CONGESTION/COUGH  DECREASE YOUR METOPROLOL TO 50 MG TWICE A DAY  Labwork: NONE  Testing/Procedures: NONE  Follow-Up: Your physician recommends that you schedule a follow-up appointment in: [place]  If you need a refill on your cardiac medications before your next appointment, please call your pharmacy.

## 2015-11-25 ENCOUNTER — Ambulatory Visit: Payer: 59

## 2015-11-28 ENCOUNTER — Other Ambulatory Visit: Payer: Self-pay | Admitting: Cardiology

## 2015-11-28 ENCOUNTER — Ambulatory Visit (INDEPENDENT_AMBULATORY_CARE_PROVIDER_SITE_OTHER): Payer: 59 | Admitting: *Deleted

## 2015-11-28 DIAGNOSIS — E538 Deficiency of other specified B group vitamins: Secondary | ICD-10-CM | POA: Diagnosis not present

## 2015-11-28 NOTE — Progress Notes (Signed)
Pt given B12 injection IM left deltoid and tolerated well. °

## 2015-12-06 ENCOUNTER — Ambulatory Visit (INDEPENDENT_AMBULATORY_CARE_PROVIDER_SITE_OTHER): Payer: 59 | Admitting: Pediatrics

## 2015-12-06 ENCOUNTER — Encounter: Payer: Self-pay | Admitting: Pediatrics

## 2015-12-06 VITALS — BP 132/55 | HR 60 | Temp 96.9°F | Ht 64.5 in | Wt 118.8 lb

## 2015-12-06 DIAGNOSIS — I10 Essential (primary) hypertension: Secondary | ICD-10-CM | POA: Diagnosis not present

## 2015-12-06 DIAGNOSIS — L853 Xerosis cutis: Secondary | ICD-10-CM

## 2015-12-06 DIAGNOSIS — I872 Venous insufficiency (chronic) (peripheral): Secondary | ICD-10-CM

## 2015-12-06 NOTE — Patient Instructions (Addendum)
Eucerin or cerave cream in a jar  Compression hose during the day  Keep feet propped up

## 2015-12-06 NOTE — Progress Notes (Signed)
Subjective:    Patient ID: Valerie [name], female    DOB: [birth year], 82 y.o.   MRN: [id number]  CC: Leg Swelling   HPI: Valerie [name] is a 82 y.o. female presenting for Leg Swelling  Here with her sister today  Sometimes feels fluid/liquid on her legs, ongoing for weeks scaley rash b/l ankles Skin darkening b/l anterior shins Has skinny ankles in the morning, sometimes larger in the afternoon when she is standing for long periods of time Scar on L leg from injury, sometimes more puffy either side of it, not today Taking HCTZ daily No SOB, no limits to exertion, no chest pain, breathing is fine No pain in legs, no fevers Skin on legs never tight Feeling well otherwise   Depression screen [facility] 2/9 12/06/2015 05/20/2015  Decreased Interest 0 0  Down, Depressed, Hopeless 0 0  PHQ - 2 Score 0 0     Relevant past medical, surgical, family and social history reviewed and updated as indicated. Interim medical history since our last visit reviewed. Allergies and medications reviewed and updated.    ROS: Per HPI unless specifically indicated above  History  Smoking status  . Never Smoker   Smokeless tobacco  . Never Used    Past Medical History Patient Active Problem List   Diagnosis Date Noted  . Acute sinusitis 11/15/2015  . Thyroid nodule 05/20/2015  . Occlusion and stenosis of carotid artery without mention of cerebral infarction 09/14/2012  . Internal and external hemorrhoids without complication [id number]  . Abdominal pain 09/15/2011  . Vitamin B12 deficiency 09/15/2011  . Abnormal weight loss 09/15/2011  . Benign hypertensive heart disease without heart failure 05/22/2011  . Pure hypercholesterolemia 05/22/2011  . Dysphagia, unspecified(787.20) 05/18/2011  . Stricture esophagus 05/18/2011  . Stricture of duodenum 05/18/2011  . Dysphagia 05/15/2011  . Family history of malignant neoplasm of gastrointestinal tract 05/15/2011  . Iron deficiency anemia 05/15/2011   . GERD (gastroesophageal reflux disease) 05/15/2011  . BRBPR (bright red blood per rectum) 05/15/2011  . DIVERTICULOSIS-COLON 02/03/2008        Objective:    BP 132/55 mmHg  Pulse 60  Temp(Src) 96.9 F (36.1 C) (Oral)  Ht 5' 4.5" (1.638 m)  Wt 118 lb 12.8 oz (53.887 kg)  BMI 20.08 kg/m2  Wt Readings from Last 3 Encounters:  12/06/15 118 lb 12.8 oz (53.887 kg)  11/15/15 114 lb 1.9 oz (51.764 kg)  10/17/15 119 lb (53.978 kg)     Gen: NAD, alert, cooperative with exam, NCAT EYES: EOMI, no scleral injection or icterus CV: NRRR, normal S1/S2, no murmur, DP pulses 1+ b/l Resp: CTABL, no wheezes, normal WOB Abd: +BS, soft, NTND. no guarding or organomegaly Ext: No pitting edema, legs equal in size Neuro: Alert and oriented, strength equal b/l UE and LE, coordination grossly normal Skin: skin intact b/l lower legs, venous stasis changes ant shins, L>R, xerosis present b/l ankles, skin soft b/l lower extremities, trace non-pitting edema at ankle, no redness or heat in legs b/l, legs same size, no tenderness with palpation. No weeping/discharge from anywhere on legs.     Assessment & Plan:    Valerie [name] was seen today for skin issues on legs. Likely noticing the venous stasis changes at home. No swelling today, no signs of infection. On amlodipine, could be causing swelling but none present now. BP is well controlled. Continue current medicines for BP. Pt otherwise asymptomatic. Discussed using compression hose for swelling that occurs throughout day and  for venous stasis, thick moisturizer for xerosis.   Diagnoses and all orders for this visit:  Chronic venous insufficiency  Xerosis of skin  Essential hypertension   Follow up plan: Return in about 2 months (around 02/05/2016) for med follow up. Sooner if needed  Assunta Found, MD [facility] 12/06/2015, 2:47 PM

## 2015-12-09 ENCOUNTER — Encounter: Payer: Self-pay | Admitting: Family Medicine

## 2015-12-09 ENCOUNTER — Ambulatory Visit (INDEPENDENT_AMBULATORY_CARE_PROVIDER_SITE_OTHER): Payer: 59 | Admitting: Family Medicine

## 2015-12-09 VITALS — BP 165/64 | HR 60 | Temp 96.7°F | Ht 64.5 in | Wt 118.0 lb

## 2015-12-09 DIAGNOSIS — L259 Unspecified contact dermatitis, unspecified cause: Secondary | ICD-10-CM

## 2015-12-09 MED ORDER — TRIAMCINOLONE ACETONIDE 0.1 % EX CREA: 1.0000 "application " | TOPICAL_CREAM | Freq: Two times a day (BID) | CUTANEOUS | Status: DC

## 2015-12-09 MED ORDER — PREDNISONE 20 MG PO TABS: ORAL_TABLET | ORAL | Status: DC

## 2015-12-09 NOTE — Progress Notes (Signed)
BP 165/64 mmHg  Pulse 60  Temp(Src) 96.7 F (35.9 C) (Oral)  Ht 5' 4.5" (1.638 m)  Wt 118 lb (53.524 kg)  BMI 19.95 kg/m2   Subjective:    Patient ID: Valerie [name], female    DOB: 02/01/1928, 82 y.o.   MRN: [id number]  HPI: Valerie [name] is a 82 y.o. female presenting on 12/09/2015 for Rash   HPI Rash Patient has a rash that is covering her entire back is itchy and very pruritic and even feels like it burns a little bit. The rash is bilateral and covers from her neck down to her buttocks. She denies really having any rash anywhere else. The rash burns so much that she feels like she is on fire at times. She does say she recently had a family member that had shingles and was concerned whether or not it would be that. She has not used anything for it yet.  Relevant past medical, surgical, family and social history reviewed and updated as indicated. Interim medical history since our last visit reviewed. Allergies and medications reviewed and updated.  Review of Systems  Constitutional: Negative for fever and chills.  HENT: Negative for congestion, ear discharge and ear pain.   Eyes: Negative for redness and visual disturbance.  Respiratory: Negative for chest tightness and shortness of breath.   Cardiovascular: Negative for chest pain and leg swelling.  Genitourinary: Negative for dysuria and difficulty urinating.  Musculoskeletal: Negative for back pain and gait problem.  Skin: Positive for rash.  Neurological: Negative for light-headedness and headaches.  Psychiatric/Behavioral: Negative for behavioral problems and agitation.  All other systems reviewed and are negative.   Per HPI unless specifically indicated above     Medication List       This list is accurate as of: 12/09/15  9:06 AM.  Always use your most recent med list.               ALPRAZolam 0.25 MG tablet  Commonly known as:  XANAX  Take 0.25 mg by mouth at bedtime as needed for sleep.     amLODipine 5 MG  tablet  Commonly known as:  NORVASC  TAKE 1 TABLET (5 MG TOTAL) BY MOUTH DAILY.     aspirin 81 MG tablet  Take 81 mg by mouth daily.     diltiazem 120 MG 24 hr capsule  Commonly known as:  CARDIZEM CD  TAKE 1 CAPSULE BY MOUTH ONCE DAILY *NEEDS MD APPOINTMENT*     esomeprazole 40 MG capsule  Commonly known as:  NEXIUM  Take 1 capsule (40 mg total) by mouth daily before breakfast.     hydrochlorothiazide 25 MG tablet  Commonly known as:  HYDRODIURIL  TAKE ONE TABLET BY MOUTH ONE TIME DAILY     latanoprost 0.005 % ophthalmic solution  Commonly known as:  XALATAN  Place 1 drop into both eyes at bedtime.     metoprolol 50 MG tablet  Commonly known as:  LOPRESSOR  Take 1 tablet (50 mg total) by mouth 2 (two) times daily.     nitroGLYCERIN 0.4 MG SL tablet  Commonly known as:  NITROSTAT  Place 1 tablet (0.4 mg total) under the tongue every 5 (five) minutes as needed for chest pain.     pravastatin 10 MG tablet  Commonly known as:  PRAVACHOL  TAKE ONE TABLET BY MOUTH ONE TIME DAILY     predniSONE 20 MG tablet  Commonly known as:  DELTASONE  2  po at same time daily for 5 days     PRESCRIPTION MEDICATION  Place 1 drop into both eyes every morning. Eye drop     timolol 0.25 % ophthalmic solution  Commonly known as:  TIMOPTIC  Place 1 drop into the left eye 2 (two) times daily.     triamcinolone cream 0.1 %  Commonly known as:  KENALOG  Apply 1 application topically 2 (two) times daily.           Objective:    BP 165/64 mmHg  Pulse 60  Temp(Src) 96.7 F (35.9 C) (Oral)  Ht 5' 4.5" (1.638 m)  Wt 118 lb (53.524 kg)  BMI 19.95 kg/m2  Wt Readings from Last 3 Encounters:  12/09/15 118 lb (53.524 kg)  12/06/15 118 lb 12.8 oz (53.887 kg)  11/15/15 114 lb 1.9 oz (51.764 kg)    Physical Exam  Constitutional: She is oriented to person, place, and time. She appears well-developed and well-nourished. No distress.  Eyes: Conjunctivae and EOM are normal. Pupils are equal,  round, and reactive to light.  Cardiovascular: Normal rate, regular rhythm, normal heart sounds and intact distal pulses.   No murmur heard. Pulmonary/Chest: Effort normal and breath sounds normal. No respiratory distress. She has no wheezes.  Musculoskeletal: Normal range of motion. She exhibits no edema or tenderness.  Neurological: She is alert and oriented to person, place, and time. Coordination normal.  Skin: Skin is warm and dry. Rash noted. Rash is papular (Small fine papules covering entire back from neck down to her buttocks bilaterally). She is not diaphoretic.  Psychiatric: She has a normal mood and affect. Her behavior is normal.  Nursing note and vitals reviewed.   Results for orders placed or performed in visit on 08/20/15  Vitamin B12  Result Value Ref Range   Vitamin B-12 874 211 - 946 pg/mL  BMP8+EGFR  Result Value Ref Range   Glucose 84 65 - 99 mg/dL   BUN 14 8 - 27 mg/dL   Creatinine, Ser 0.64 0.57 - 1.00 mg/dL   GFR calc non Af Amer 82 >59 mL/min/1.73   GFR calc Af Amer 94 >59 mL/min/1.73   BUN/Creatinine Ratio 22 11 - 26   Sodium 142 136 - 144 mmol/L   Potassium 4.0 3.5 - 5.2 mmol/L   Chloride 101 97 - 106 mmol/L   CO2 29 18 - 29 mmol/L   Calcium 9.9 8.7 - 10.3 mg/dL      Assessment & Plan:   Problem List Items Addressed This Visit    None    Visit Diagnoses    Contact dermatitis    -  Primary    Relevant Medications    predniSONE (DELTASONE) 20 MG tablet    triamcinolone cream (KENALOG) 0.1 %        Follow up plan: Return if symptoms worsen or fail to improve.  Counseling provided for all of the vaccine components No orders of the defined types were placed in this encounter.    Caryl Pina, MD [facility] 12/09/2015, 9:06 AM

## 2015-12-30 ENCOUNTER — Ambulatory Visit: Payer: 59

## 2016-01-17 ENCOUNTER — Encounter (INDEPENDENT_AMBULATORY_CARE_PROVIDER_SITE_OTHER): Payer: Self-pay

## 2016-01-17 ENCOUNTER — Ambulatory Visit (INDEPENDENT_AMBULATORY_CARE_PROVIDER_SITE_OTHER): Payer: Medicare Other | Admitting: *Deleted

## 2016-01-17 DIAGNOSIS — E538 Deficiency of other specified B group vitamins: Secondary | ICD-10-CM | POA: Diagnosis not present

## 2016-01-17 MED ORDER — CYANOCOBALAMIN 1000 MCG/ML IJ SOLN
1000.0000 ug | INTRAMUSCULAR | Status: AC
  Administered 2016-01-17 – 2016-05-25 (×4): 1000 ug via INTRAMUSCULAR

## 2016-01-17 NOTE — Progress Notes (Signed)
Vitamin b12 injection given and patient tolerated well.  

## 2016-01-17 NOTE — Patient Instructions (Signed)

## 2016-02-04 ENCOUNTER — Other Ambulatory Visit: Payer: Self-pay | Admitting: *Deleted

## 2016-02-04 MED ORDER — METOPROLOL TARTRATE 50 MG PO TABS: 50.0000 mg | ORAL_TABLET | Freq: Two times a day (BID) | ORAL | Status: DC

## 2016-02-17 ENCOUNTER — Ambulatory Visit: Payer: 59

## 2016-02-26 ENCOUNTER — Ambulatory Visit (INDEPENDENT_AMBULATORY_CARE_PROVIDER_SITE_OTHER): Payer: Medicare Other | Admitting: *Deleted

## 2016-02-26 DIAGNOSIS — E538 Deficiency of other specified B group vitamins: Secondary | ICD-10-CM

## 2016-02-26 NOTE — Progress Notes (Signed)
Pt given B12 injection IM right deltoid and tolerated well. 

## 2016-02-26 NOTE — Patient Instructions (Signed)

## 2016-02-28 ENCOUNTER — Other Ambulatory Visit: Payer: Self-pay | Admitting: Family Medicine

## 2016-03-02 NOTE — Telephone Encounter (Signed)
Last seen 12/09/15  Dr Dettinger   Dr Livia Snellen  PCP  IF approved route to nurse [facility]

## 2016-03-03 NOTE — Telephone Encounter (Signed)
Please send request to his PCP 

## 2016-03-03 NOTE — Telephone Encounter (Signed)
Please review and advise.

## 2016-03-06 ENCOUNTER — Ambulatory Visit (INDEPENDENT_AMBULATORY_CARE_PROVIDER_SITE_OTHER): Payer: Medicare Other | Admitting: Cardiology

## 2016-03-06 ENCOUNTER — Encounter: Payer: Self-pay | Admitting: Cardiology

## 2016-03-06 VITALS — BP 134/70 | HR 50 | Ht 64.0 in | Wt 118.0 lb

## 2016-03-06 DIAGNOSIS — I739 Peripheral vascular disease, unspecified: Secondary | ICD-10-CM

## 2016-03-06 DIAGNOSIS — I779 Disorder of arteries and arterioles, unspecified: Secondary | ICD-10-CM

## 2016-03-06 DIAGNOSIS — R002 Palpitations: Secondary | ICD-10-CM | POA: Diagnosis not present

## 2016-03-06 DIAGNOSIS — R011 Cardiac murmur, unspecified: Secondary | ICD-10-CM

## 2016-03-06 DIAGNOSIS — I10 Essential (primary) hypertension: Secondary | ICD-10-CM | POA: Diagnosis not present

## 2016-03-06 DIAGNOSIS — Z01810 Encounter for preprocedural cardiovascular examination: Secondary | ICD-10-CM

## 2016-03-06 NOTE — Progress Notes (Signed)
Cardiology Office Note  Date: 03/06/2016   ID: Valerie [name], DOB 1928/06/18, MRN [id number]  PCP: Claretta Fraise, MD  Evaluating Cardiologist: Rozann Lesches, MD   Chief Complaint  Patient presents with  . Follow-up palpitations    History of Present Illness: Valerie [name] is an 82 y.o. female former patient of Dr. Mare Ferrari, now establishing follow-up with me in clinic. This is our first meeting today. I reviewed her records and updated her chart. She presents for routine assessment, reports occasional palpitations which have been a chronic issue, no escalation in pattern however. She has had no sudden dizziness or syncope, no exertional chest pain.  I reviewed her most recent dry Dopplers and Myoview study as outlined below. She has been continued on medical therapy, primarily aimed at blood pressure control and management of palpitations. Current regimen includes Cardizem CD and Lopressor. She is also on Pravachol with lipid panel in August of last year showing LDL 106.  She lives in her own home, has a grandchild that lives there as well. Her husband of 15 years passed away this past 08/20/2023. She is still obviously adjusting to this. She reports some trouble sleeping.  She tells me that she is being considered for cataract surgery and treatment of glaucoma - I do not yet have details about her ophthalmologist or when this is planned.  Past Medical History  Diagnosis Date  . Essential hypertension   . Hyperlipidemia   . Carotid artery disease ([place])   . Palpitations   . Esophageal stricture   . Esophageal reflux   . Duodenitis   . Diverticulosis of colon with hemorrhage   . IBS (irritable bowel syndrome)   . Arthritis   . Anxiety   . Cataract   . Depression   . Glaucoma   . Hiatal hernia   . UTI (urinary tract infection) March 2015    Past Surgical History  Procedure Laterality Date  . Dilation and curettage of uterus    . Right rotator cuff repair x1    . Left  rotator cuff repair x2    . Cholecystectomy      Current Outpatient Prescriptions  Medication Sig Dispense Refill  . ALPRAZolam (XANAX) 0.25 MG tablet Take 0.25 mg by mouth at bedtime as needed for sleep.    Marland Kitchen amLODipine (NORVASC) 5 MG tablet TAKE 1 TABLET (5 MG TOTAL) BY MOUTH DAILY. 30 tablet 11  . aspirin 81 MG tablet Take 81 mg by mouth daily.      Marland Kitchen diltiazem (CARDIZEM CD) 120 MG 24 hr capsule TAKE 1 CAPSULE BY MOUTH ONCE DAILY *NEEDS MD APPOINTMENT* 30 capsule 4  . esomeprazole (NEXIUM) 40 MG capsule Take 1 capsule (40 mg total) by mouth daily before breakfast. 30 capsule 0  . hydrochlorothiazide (HYDRODIURIL) 25 MG tablet TAKE ONE TABLET BY MOUTH ONE TIME DAILY 30 tablet 5  . latanoprost (XALATAN) 0.005 % ophthalmic solution Place 1 drop into both eyes at bedtime.    . metoprolol (LOPRESSOR) 50 MG tablet Take 1 tablet (50 mg total) by mouth 2 (two) times daily. 180 tablet 2  . nitroGLYCERIN (NITROSTAT) 0.4 MG SL tablet Place 1 tablet (0.4 mg total) under the tongue every 5 (five) minutes as needed for chest pain. 100 tablet prn  . pravastatin (PRAVACHOL) 10 MG tablet TAKE ONE TABLET BY MOUTH ONE TIME DAILY 90 tablet 1  . PRESCRIPTION MEDICATION Place 1 drop into both eyes every morning. Eye drop    . timolol (  TIMOPTIC) 0.25 % ophthalmic solution Place 1 drop into the left eye 2 (two) times daily.    Marland Kitchen triamcinolone cream (KENALOG) 0.1 % Apply 1 application topically 2 (two) times daily. 80 g 1  . [DISCONTINUED] lisinopril (PRINIVIL,ZESTRIL) 20 MG tablet Take 1 tablet by mouth 1 day or 1 dose.     Current Facility-Administered Medications  Medication Dose Route Frequency Provider Last Rate Last Dose  . cyanocobalamin ((VITAMIN B-12)) injection 1,000 mcg  1,000 mcg Intramuscular Q30 days Claretta Fraise, MD   1,000 mcg at 02/26/16 1020   Allergies:  Lisinopril and Sulfa antibiotics   Social History: The patient  reports that she has never smoked. She has never used smokeless tobacco.  She reports that she does not drink alcohol or use illicit drugs.   ROS:  Please see the history of present illness. Otherwise, complete review of systems is positive for arthritic symptoms.  All other systems are reviewed and negative.   Physical Exam: VS:  BP 134/70 mmHg  Pulse 50  Ht 5\' 4"  (1.626 m)  Wt 118 lb (53.524 kg)  BMI 20.24 kg/m2  SpO2 98%, BMI Body mass index is 20.24 kg/(m^2).  Wt Readings from Last 3 Encounters:  03/06/16 118 lb (53.524 kg)  12/09/15 118 lb (53.524 kg)  12/06/15 118 lb 12.8 oz (53.887 kg)    General: Thin elderly woman, appears comfortable at rest. HEENT: Conjunctiva and lids normal, oropharynx clear. Neck: Supple, no elevated JVP or carotid bruits, no thyromegaly. Lungs: Clear to auscultation, nonlabored breathing at rest. Cardiac: Regular rate and rhythm, no S3, 2/6 systolic murmur at right base predominantly, no pericardial rub. Abdomen: Soft, nontender, bowel sounds present. Extremities: No pitting edema, distal pulses 2+. Skin: Warm and dry. Musculoskeletal: No kyphosis. Neuropsychiatric: Alert and oriented x3, affect grossly appropriate.  ECG: I personally reviewed the tracing from 11/15/2015 which showed sinus bradycardia with prolonged PR interval.  Recent Labwork: 05/20/2015: ALT 13; AST 17; Platelets 380*; TSH 1.150 08/20/2015: BUN 14; Creatinine, Ser 0.64; Potassium 4.0; Sodium 142     Component Value Date/Time   CHOL 191 05/20/2015 1234   CHOL 206* 12/05/2014 0946   TRIG 152* 05/20/2015 1234   HDL 55 05/20/2015 1234   HDL 55.30 12/05/2014 0946   CHOLHDL 3.5 05/20/2015 1234   CHOLHDL 4 12/05/2014 0946   VLDL 26.0 12/05/2014 0946   LDLCALC 106* 05/20/2015 1234   LDLCALC 125* 12/05/2014 0946    Other Studies Reviewed Today:  Carotid Dopplers 10/17/2015: Stable 1-39% bilateral ICA stenoses.  Lexiscan Myoview 01/25/2014: Impression Exercise Capacity: Lexiscan with no exercise. BP Response: Normal blood pressure  response. Clinical Symptoms: No significant symptoms noted. ECG Impression: No significant ST segment change suggestive of ischemia. Comparison with Prior Nuclear Study: No previous nuclear study performed  Overall Impression: Low risk stress nuclear study with lateral wall attenuation artifact. Gated images are not available, so a non-transmural lateral wall infarction cannot be entirely excluded.. The defect is extensive, but mild and corresponds to expected arm attenuation.  Assessment and Plan:  1. Chronic history of palpitations, no progressive symptoms, no sudden dizziness or syncope. She continues on both Cardizem CD and Lopressor, heart rate is in the 50s and she does not seem to be limited by bradycardia at this time. May need to consider cutting back dose of Lopressor ultimately.  2. History of cardiac murmur, suspect aortic valve sclerosis based on examination. She has had prior echocardiography as of 2009 per Dr. Sherryl Barters notes, no major valvular heart  disease. We will continue observation for now.  3. Essential hypertension, blood pressure control is adequate today.  4. Mild carotid artery atherosclerosis, stable by carotid Dopplers earlier this year.  5. Pending cataract surgery and treatment of glaucoma. No specific cardiac contraindication to proceed. Can hold aspirin if needed.  Current medicines were reviewed with the patient today.   Disposition: FU with me in 6 months.   Signed, Satira Sark, MD, [facility] 03/06/2016 8:25 AM    [facility] [place], [place], [place] [address] Phone: [phone]; Fax: [phone]

## 2016-03-06 NOTE — Patient Instructions (Signed)
Continue all current medications. Your physician wants you to follow up in: 6 months.  You will receive a reminder letter in the mail one-two months in advance.  If you don't receive a letter, please call our office to schedule the follow up appointment   

## 2016-03-30 ENCOUNTER — Ambulatory Visit: Payer: Medicare Other

## 2016-04-09 ENCOUNTER — Telehealth: Payer: Self-pay | Admitting: Family Medicine

## 2016-04-14 ENCOUNTER — Ambulatory Visit (INDEPENDENT_AMBULATORY_CARE_PROVIDER_SITE_OTHER): Payer: Medicare Other | Admitting: *Deleted

## 2016-04-14 DIAGNOSIS — E538 Deficiency of other specified B group vitamins: Secondary | ICD-10-CM

## 2016-04-14 NOTE — Patient Instructions (Signed)

## 2016-04-14 NOTE — Progress Notes (Signed)
Vitamin b12 injection given and patient tolerated well.  

## 2016-05-07 ENCOUNTER — Other Ambulatory Visit: Payer: Self-pay | Admitting: Cardiology

## 2016-05-22 ENCOUNTER — Other Ambulatory Visit: Payer: Self-pay | Admitting: *Deleted

## 2016-05-22 MED ORDER — PRAVASTATIN SODIUM 10 MG PO TABS: 10.0000 mg | ORAL_TABLET | Freq: Every day | ORAL | 2 refills | Status: DC

## 2016-05-25 ENCOUNTER — Encounter: Payer: Self-pay | Admitting: Family Medicine

## 2016-05-25 ENCOUNTER — Ambulatory Visit (INDEPENDENT_AMBULATORY_CARE_PROVIDER_SITE_OTHER): Payer: Medicare Other | Admitting: Family Medicine

## 2016-05-25 VITALS — BP 144/64 | HR 58 | Temp 96.6°F | Ht 64.0 in | Wt 119.0 lb

## 2016-05-25 DIAGNOSIS — Z0000 Encounter for general adult medical examination without abnormal findings: Secondary | ICD-10-CM | POA: Diagnosis not present

## 2016-05-25 DIAGNOSIS — D509 Iron deficiency anemia, unspecified: Secondary | ICD-10-CM

## 2016-05-25 DIAGNOSIS — I6523 Occlusion and stenosis of bilateral carotid arteries: Secondary | ICD-10-CM

## 2016-05-25 DIAGNOSIS — G301 Alzheimer's disease with late onset: Secondary | ICD-10-CM

## 2016-05-25 DIAGNOSIS — I119 Hypertensive heart disease without heart failure: Secondary | ICD-10-CM

## 2016-05-25 DIAGNOSIS — E7800 Pure hypercholesterolemia, unspecified: Secondary | ICD-10-CM

## 2016-05-25 DIAGNOSIS — E538 Deficiency of other specified B group vitamins: Secondary | ICD-10-CM | POA: Diagnosis not present

## 2016-05-25 DIAGNOSIS — F0280 Dementia in other diseases classified elsewhere without behavioral disturbance: Secondary | ICD-10-CM

## 2016-05-25 DIAGNOSIS — E559 Vitamin D deficiency, unspecified: Secondary | ICD-10-CM

## 2016-05-25 DIAGNOSIS — H409 Unspecified glaucoma: Secondary | ICD-10-CM | POA: Insufficient documentation

## 2016-05-25 LAB — URINALYSIS
BILIRUBIN UA: NEGATIVE
Glucose, UA: NEGATIVE
Ketones, UA: NEGATIVE
Leukocytes, UA: NEGATIVE
Nitrite, UA: NEGATIVE
PH UA: 7 (ref 5.0–7.5)
Protein, UA: NEGATIVE
Specific Gravity, UA: 1.02 (ref 1.005–1.030)
UUROB: 1 mg/dL (ref 0.2–1.0)

## 2016-05-25 MED ORDER — DONEPEZIL HCL 5 MG PO TABS: 5.0000 mg | ORAL_TABLET | Freq: Every day | ORAL | 1 refills | Status: DC

## 2016-05-25 NOTE — Progress Notes (Signed)
Subjective:   Valerie [name] is a 82 y.o. female who presents for an Initial Medicare Annual Wellness Visit.  3 recent surgeries for Glaucoma. Associated vision loss led to depression  Current Medications (verified) Outpatient Encounter Prescriptions as of 05/25/2016  Medication Sig  . amLODipine (NORVASC) 5 MG tablet TAKE 1 TABLET (5 MG TOTAL) BY MOUTH DAILY.  Marland Kitchen aspirin 81 MG tablet Take 81 mg by mouth daily.    Marland Kitchen diltiazem (CARDIZEM CD) 120 MG 24 hr capsule TAKE 1 CAPSULE BY MOUTH ONCE DAILY *NEEDS MD APPOINTMENT*  . esomeprazole (NEXIUM) 40 MG capsule Take 1 capsule (40 mg total) by mouth daily before breakfast.  . hydrochlorothiazide (HYDRODIURIL) 25 MG tablet TAKE ONE TABLET BY MOUTH ONE TIME DAILY  . latanoprost (XALATAN) 0.005 % ophthalmic solution Place 1 drop into both eyes at bedtime.  . metoprolol (LOPRESSOR) 50 MG tablet Take 1 tablet (50 mg total) by mouth 2 (two) times daily.  . nitroGLYCERIN (NITROSTAT) 0.4 MG SL tablet Place 1 tablet (0.4 mg total) under the tongue every 5 (five) minutes as needed for chest pain.  . pravastatin (PRAVACHOL) 10 MG tablet Take 1 tablet (10 mg total) by mouth daily.  Marland Kitchen PRESCRIPTION MEDICATION Place 1 drop into both eyes every morning. Eye drop  . timolol (TIMOPTIC) 0.25 % ophthalmic solution Place 1 drop into the left eye 2 (two) times daily.  Marland Kitchen ALPRAZolam (XANAX) 0.25 MG tablet Take 0.25 mg by mouth at bedtime as needed for sleep.  Marland Kitchen donepezil (ARICEPT) 5 MG tablet Take 1 tablet (5 mg total) by mouth at bedtime.  . triamcinolone cream (KENALOG) 0.1 % Apply 1 application topically 2 (two) times daily. (Patient not taking: Reported on 05/25/2016)   Facility-Administered Encounter Medications as of 05/25/2016  Medication  . cyanocobalamin ((VITAMIN B-12)) injection 1,000 mcg    Allergies (verified) Lisinopril and Sulfa antibiotics   History: Past Medical History:  Diagnosis Date  . Anxiety   . Arthritis   . Carotid artery disease ([place])     . Cataract   . Depression   . Diverticulosis of colon with hemorrhage   . Duodenitis   . Esophageal reflux   . Esophageal stricture   . Essential hypertension   . Glaucoma   . Hiatal hernia   . Hyperlipidemia   . IBS (irritable bowel syndrome)   . Palpitations   . UTI (urinary tract infection) March 2015   Past Surgical History:  Procedure Laterality Date  . CHOLECYSTECTOMY    . DILATION AND CURETTAGE OF UTERUS    . LEFT ROTATOR CUFF REPAIR X2    . RIGHT ROTATOR CUFF REPAIR X1     Family History  Problem Relation Age of Onset  . Leukemia Mother   . Colon cancer Father 50  . Kidney cancer Brother   . Bladder Cancer Brother   . Stroke Sister   . Esophageal cancer Neg Hx   . Stomach cancer Neg Hx    Social History   Occupational History  . retired Retired   Social History Main Topics  . Smoking status: Never Smoker  . Smokeless tobacco: Never Used  . Alcohol use No  . Drug use: No  . Sexual activity: Not on file    Do you feel safe at home?  Yes Are there smokers in your home (other than you)? No  Dietary issues and exercise activities: Current Exercise Habits: The patient does not participate in regular exercise at present  Current Dietary habits:  Nml, no specific diet Review of Systems  Constitutional: Negative for chills, diaphoresis, fever, malaise/fatigue and weight loss.  HENT: Negative for congestion, ear pain, hearing loss, nosebleeds, sore throat and tinnitus.   Eyes: Negative for blurred vision, double vision, photophobia, pain, discharge and redness.  Respiratory: Negative for cough, hemoptysis, sputum production, shortness of breath and wheezing.   Cardiovascular: Negative for chest pain, palpitations, orthopnea, leg swelling and PND.  Gastrointestinal: Negative for abdominal pain, blood in stool, constipation, diarrhea, heartburn, melena, nausea and vomiting.  Genitourinary: Negative for dysuria, flank pain, frequency, hematuria and urgency.   Musculoskeletal: Negative for back pain, falls, myalgias and neck pain.  Skin: Negative for itching and rash.  Neurological: Negative for dizziness, tingling, tremors, sensory change, speech change, focal weakness, seizures, loss of consciousness, weakness and headaches.  Endo/Heme/Allergies: Negative for environmental allergies and polydipsia. Does not bruise/bleed easily.  Psychiatric/Behavioral: Negative for depression, hallucinations, memory loss, substance abuse and suicidal ideas. The patient is not nervous/anxious and does not have insomnia.    Objective:    Today's Vitals   05/25/16 1055  BP: (!) 144/64  Pulse: (!) 58  Temp: (!) 96.6 F (35.9 C)  TempSrc: Oral  SpO2: 97%  Weight: 119 lb (54 kg)  Height: 5\' 4"  (1.626 m)   Body mass index is 20.43 kg/m.  Activities of Daily Living In your present state of health, do you have any difficulty performing the following activities: 05/25/2016 05/25/2016  Hearing? - N  Vision? Y Y  Difficulty concentrating or making decisions? Y N  Walking or climbing stairs? N N  Dressing or bathing? N N  Doing errands, shopping? N N  Some recent data might be hidden        Depression Screen PHQ 2/9 Scores 05/25/2016 12/06/2015 05/20/2015  PHQ - 2 Score 0 0 0    "I hate to admit it."   Fall Risk Fall Risk  05/25/2016 12/06/2015 05/20/2015  Falls in the past year? No No Yes  Number falls in past yr: - - 1  Injury with Fall? - - Yes   Physical Exam  Constitutional: She is oriented to person, place, and time. She appears well-developed and well-nourished. No distress.  HENT:  Head: Normocephalic and atraumatic.  Right Ear: External ear normal.  Left Ear: External ear normal.  Nose: Nose normal.  Mouth/Throat: Oropharynx is clear and moist. No oropharyngeal exudate.  Eyes: Conjunctivae and EOM are normal. Pupils are equal, round, and reactive to light. Right eye exhibits no discharge. Left eye exhibits no discharge. No scleral icterus.   Neck: Normal range of motion. Neck supple. No JVD present. No thyromegaly present.  Cardiovascular: Normal rate, regular rhythm, normal heart sounds and intact distal pulses.  Exam reveals no gallop and no friction rub.   No murmur heard. Pulmonary/Chest: Effort normal and breath sounds normal. No stridor. No respiratory distress. She has no wheezes. She has no rales. She exhibits no tenderness.  Abdominal: Soft. Bowel sounds are normal. There is no tenderness.  Musculoskeletal: Normal range of motion.  Lymphadenopathy:    She has no cervical adenopathy.  Neurological: She is alert and oriented to person, place, and time. She displays normal reflexes. No cranial nerve deficit. She exhibits normal muscle tone. Coordination normal.  Skin: Skin is warm and dry. She is not diaphoretic.  Psychiatric: She has a normal mood and affect. Her behavior is normal.  Vitals reviewed.   Cognitive Function: MMSE - Mini Mental State Exam 05/25/2016  Orientation to  time 4  Orientation to Place 5  Registration 3  Attention/ Calculation 2  Recall 1  Language- name 2 objects 2  Language- repeat 1  Language- follow 3 step command 3  Language- read & follow direction 1  Write a sentence 1  Copy design 1  Total score 24    Immunizations and Health Maintenance Immunization History  Administered Date(s) Administered  . Influenza,inj,Quad PF,36+ Mos 07/10/2013, 08/07/2015  . Influenza-Unspecified 07/30/2014   Health Maintenance Due  Topic Date Due  . TETANUS/TDAP  12/27/1948  . ZOSTAVAX  12/27/1989  . DEXA SCAN  12/28/1994  . PNA vac Low Risk Adult (1 of 2 - PCV13) 12/28/1994  . INFLUENZA VACCINE  04/28/2016    Patient Care Team: Claretta Fraise, MD as PCP - General (Family Medicine) Jaci Lazier, MD as Referring Physician (Ophthalmology) Darlin Coco, MD as Consulting Physician (Cardiology) Dr. Arlina Robes, Ophthalmology  Indicate any recent Medical Services you may have received from other  than [facility] providers in the past year (date may be approximate).    Assessment:    Annual Wellness Visit    Screening Tests Health Maintenance  Topic Date Due  . TETANUS/TDAP  12/27/1948  . ZOSTAVAX  12/27/1989  . DEXA SCAN  12/28/1994  . PNA vac Low Risk Adult (1 of 2 - PCV13) 12/28/1994  . INFLUENZA VACCINE  04/28/2016        Plan:   During the course of the visit Shaquasha was educated and counseled about the following appropriate screening and preventive services:   Vaccines to include Pneumoccal, Influenza, Td, Zostavax,  Colorectal cancer screening  Cardiovascular disease screening  Diabetes screening  Bone Denisty / Osteoporosis Screening  Mammogram  PAP  Glaucoma screening / Diabetic Eye Exam  Nutrition counseling  Smoking cessation counseling  Advanced Directives  Physical Activity   Goals: Work on being more active when possible.  Continue Glaucoma surgery program New med daily as directed - aricept   Patient Instructions (the written plan) were given to the patient.   Claretta Fraise, MD   05/25/2016

## 2016-05-26 ENCOUNTER — Other Ambulatory Visit: Payer: Self-pay | Admitting: Family Medicine

## 2016-05-26 LAB — CBC WITH DIFFERENTIAL/PLATELET
BASOS ABS: 0 10*3/uL (ref 0.0–0.2)
Basos: 0 %
EOS (ABSOLUTE): 0.2 10*3/uL (ref 0.0–0.4)
EOS: 2 %
HEMATOCRIT: 40.5 % (ref 34.0–46.6)
HEMOGLOBIN: 14.1 g/dL (ref 11.1–15.9)
IMMATURE GRANULOCYTES: 0 %
Immature Grans (Abs): 0 10*3/uL (ref 0.0–0.1)
LYMPHS ABS: 2.3 10*3/uL (ref 0.7–3.1)
LYMPHS: 27 %
MCH: 32.6 pg (ref 26.6–33.0)
MCHC: 34.8 g/dL (ref 31.5–35.7)
MCV: 94 fL (ref 79–97)
MONOCYTES: 6 %
Monocytes Absolute: 0.5 10*3/uL (ref 0.1–0.9)
NEUTROS PCT: 65 %
Neutrophils Absolute: 5.5 10*3/uL (ref 1.4–7.0)
Platelets: 357 10*3/uL (ref 150–379)
RBC: 4.32 x10E6/uL (ref 3.77–5.28)
RDW: 12.6 % (ref 12.3–15.4)
WBC: 8.5 10*3/uL (ref 3.4–10.8)

## 2016-05-26 LAB — CMP14+EGFR
ALBUMIN: 4.4 g/dL (ref 3.5–4.7)
ALK PHOS: 97 IU/L (ref 39–117)
ALT: 17 IU/L (ref 0–32)
AST: 19 IU/L (ref 0–40)
Albumin/Globulin Ratio: 1.6 (ref 1.2–2.2)
BUN/Creatinine Ratio: 29 — ABNORMAL HIGH (ref 12–28)
BUN: 21 mg/dL (ref 8–27)
Bilirubin Total: 0.4 mg/dL (ref 0.0–1.2)
CALCIUM: 10.2 mg/dL (ref 8.7–10.3)
CO2: 28 mmol/L (ref 18–29)
CREATININE: 0.73 mg/dL (ref 0.57–1.00)
Chloride: 102 mmol/L (ref 96–106)
GFR calc Af Amer: 86 mL/min/{1.73_m2} (ref >59)
GFR, EST NON AFRICAN AMERICAN: 75 mL/min/{1.73_m2} (ref >59)
GLUCOSE: 81 mg/dL (ref 65–99)
Globulin, Total: 2.8 g/dL (ref 1.5–4.5)
Potassium: 4.8 mmol/L (ref 3.5–5.2)
Sodium: 144 mmol/L (ref 134–144)
Total Protein: 7.2 g/dL (ref 6.0–8.5)

## 2016-05-26 LAB — LIPID PANEL
CHOLESTEROL TOTAL: 194 mg/dL (ref 100–199)
Chol/HDL Ratio: 3.6 ratio units (ref 0.0–4.4)
HDL: 54 mg/dL (ref >39)
LDL Calculated: 113 mg/dL — ABNORMAL HIGH (ref 0–99)
TRIGLYCERIDES: 137 mg/dL (ref 0–149)
VLDL Cholesterol Cal: 27 mg/dL (ref 5–40)

## 2016-05-26 LAB — VITAMIN D 25 HYDROXY (VIT D DEFICIENCY, FRACTURES): Vit D, 25-Hydroxy: 25 ng/mL — ABNORMAL LOW (ref 30.0–100.0)

## 2016-05-26 LAB — VITAMIN B12

## 2016-05-26 MED ORDER — VITAMIN D (ERGOCALCIFEROL) 1.25 MG (50000 UNIT) PO CAPS: 50000.0000 [IU] | ORAL_CAPSULE | ORAL | 0 refills | Status: DC

## 2016-06-01 ENCOUNTER — Other Ambulatory Visit: Payer: Self-pay | Admitting: Cardiology

## 2016-06-03 ENCOUNTER — Other Ambulatory Visit: Payer: Self-pay | Admitting: Cardiology

## 2016-06-03 MED ORDER — HYDROCHLOROTHIAZIDE 25 MG PO TABS: 25.0000 mg | ORAL_TABLET | Freq: Every day | ORAL | 5 refills | Status: DC

## 2016-06-03 NOTE — Telephone Encounter (Signed)
REFILL:  Needs refill of hydrochlorothiazide (HYDRODIURIL) 25 MG tablet  Please send to [facility] # [phone] Fax # [phone]

## 2016-06-03 NOTE — Telephone Encounter (Signed)
Medication sent to pharmacy  

## 2016-06-16 ENCOUNTER — Other Ambulatory Visit: Payer: Self-pay | Admitting: *Deleted

## 2016-06-16 MED ORDER — VITAMIN D (ERGOCALCIFEROL) 1.25 MG (50000 UNIT) PO CAPS: 50000.0000 [IU] | ORAL_CAPSULE | ORAL | 0 refills | Status: DC

## 2016-06-25 ENCOUNTER — Other Ambulatory Visit: Payer: Self-pay | Admitting: *Deleted

## 2016-06-25 MED ORDER — DONEPEZIL HCL 5 MG PO TABS: 5.0000 mg | ORAL_TABLET | Freq: Every day | ORAL | 1 refills | Status: DC

## 2016-06-29 ENCOUNTER — Encounter (HOSPITAL_COMMUNITY): Payer: Self-pay | Admitting: Emergency Medicine

## 2016-06-29 DIAGNOSIS — I160 Hypertensive urgency: Secondary | ICD-10-CM | POA: Diagnosis not present

## 2016-06-29 DIAGNOSIS — Z7982 Long term (current) use of aspirin: Secondary | ICD-10-CM | POA: Insufficient documentation

## 2016-06-29 DIAGNOSIS — K219 Gastro-esophageal reflux disease without esophagitis: Secondary | ICD-10-CM | POA: Insufficient documentation

## 2016-06-29 DIAGNOSIS — E876 Hypokalemia: Secondary | ICD-10-CM | POA: Diagnosis not present

## 2016-06-29 DIAGNOSIS — F329 Major depressive disorder, single episode, unspecified: Secondary | ICD-10-CM | POA: Diagnosis not present

## 2016-06-29 DIAGNOSIS — I2510 Atherosclerotic heart disease of native coronary artery without angina pectoris: Secondary | ICD-10-CM | POA: Insufficient documentation

## 2016-06-29 DIAGNOSIS — H409 Unspecified glaucoma: Secondary | ICD-10-CM | POA: Diagnosis not present

## 2016-06-29 DIAGNOSIS — K589 Irritable bowel syndrome without diarrhea: Secondary | ICD-10-CM | POA: Insufficient documentation

## 2016-06-29 DIAGNOSIS — F419 Anxiety disorder, unspecified: Secondary | ICD-10-CM | POA: Insufficient documentation

## 2016-06-29 DIAGNOSIS — Z66 Do not resuscitate: Secondary | ICD-10-CM | POA: Insufficient documentation

## 2016-06-29 DIAGNOSIS — D509 Iron deficiency anemia, unspecified: Secondary | ICD-10-CM | POA: Diagnosis not present

## 2016-06-29 DIAGNOSIS — E7800 Pure hypercholesterolemia, unspecified: Secondary | ICD-10-CM | POA: Insufficient documentation

## 2016-06-29 LAB — URINE MICROSCOPIC-ADD ON

## 2016-06-29 LAB — URINALYSIS, ROUTINE W REFLEX MICROSCOPIC
Bilirubin Urine: NEGATIVE
Glucose, UA: NEGATIVE mg/dL
KETONES UR: NEGATIVE mg/dL
LEUKOCYTES UA: NEGATIVE
NITRITE: NEGATIVE
PROTEIN: NEGATIVE mg/dL
Specific Gravity, Urine: 1.008 (ref 1.005–1.030)
pH: 6 (ref 5.0–8.0)

## 2016-06-29 LAB — CBC WITH DIFFERENTIAL/PLATELET
Basophils Absolute: 0 10*3/uL (ref 0.0–0.1)
Basophils Relative: 0 %
EOS PCT: 2 %
Eosinophils Absolute: 0.1 10*3/uL (ref 0.0–0.7)
HEMATOCRIT: 42.5 % (ref 36.0–46.0)
Hemoglobin: 14.2 g/dL (ref 12.0–15.0)
LYMPHS ABS: 2.4 10*3/uL (ref 0.7–4.0)
LYMPHS PCT: 33 %
MCH: 32.1 pg (ref 26.0–34.0)
MCHC: 33.4 g/dL (ref 30.0–36.0)
MCV: 96.2 fL (ref 78.0–100.0)
MONO ABS: 0.5 10*3/uL (ref 0.1–1.0)
MONOS PCT: 6 %
NEUTROS ABS: 4.3 10*3/uL (ref 1.7–7.7)
Neutrophils Relative %: 59 %
PLATELETS: 356 10*3/uL (ref 150–400)
RBC: 4.42 MIL/uL (ref 3.87–5.11)
RDW: 12.7 % (ref 11.5–15.5)
WBC: 7.3 10*3/uL (ref 4.0–10.5)

## 2016-06-29 LAB — COMPREHENSIVE METABOLIC PANEL
ALT: 13 U/L — ABNORMAL LOW (ref 14–54)
AST: 21 U/L (ref 15–41)
Albumin: 4.1 g/dL (ref 3.5–5.0)
Alkaline Phosphatase: 68 U/L (ref 38–126)
Anion gap: 7 (ref 5–15)
BILIRUBIN TOTAL: 0.8 mg/dL (ref 0.3–1.2)
BUN: 14 mg/dL (ref 6–20)
CHLORIDE: 104 mmol/L (ref 101–111)
CO2: 27 mmol/L (ref 22–32)
Calcium: 9.8 mg/dL (ref 8.9–10.3)
Creatinine, Ser: 0.74 mg/dL (ref 0.44–1.00)
Glucose, Bld: 96 mg/dL (ref 65–99)
POTASSIUM: 4 mmol/L (ref 3.5–5.1)
Sodium: 138 mmol/L (ref 135–145)
TOTAL PROTEIN: 6.9 g/dL (ref 6.5–8.1)

## 2016-06-29 NOTE — ED Triage Notes (Addendum)
Pt. reports elevated blood pressure at home 209/79 today , mild SOB , fatigue and generalized weakness. Bradycardic at triage . Pt. stated she has not taken her HCTZ for 2 days .

## 2016-06-30 ENCOUNTER — Telehealth: Payer: Self-pay | Admitting: Family Medicine

## 2016-06-30 ENCOUNTER — Encounter (HOSPITAL_COMMUNITY): Payer: Self-pay | Admitting: Radiology

## 2016-06-30 ENCOUNTER — Emergency Department (HOSPITAL_COMMUNITY): Payer: Medicare Other

## 2016-06-30 ENCOUNTER — Observation Stay (HOSPITAL_COMMUNITY)
Admission: EM | Admit: 2016-06-30 | Discharge: 2016-07-01 | Disposition: A | Discharge status: Home / Self Care | Payer: Medicare Other | Attending: Internal Medicine | Admitting: Internal Medicine

## 2016-06-30 ENCOUNTER — Ambulatory Visit: Payer: Medicare Other | Admitting: Physician Assistant

## 2016-06-30 DIAGNOSIS — H409 Unspecified glaucoma: Secondary | ICD-10-CM | POA: Diagnosis present

## 2016-06-30 DIAGNOSIS — R51 Headache: Secondary | ICD-10-CM

## 2016-06-30 DIAGNOSIS — E7800 Pure hypercholesterolemia, unspecified: Secondary | ICD-10-CM | POA: Diagnosis present

## 2016-06-30 DIAGNOSIS — I160 Hypertensive urgency: Secondary | ICD-10-CM | POA: Diagnosis present

## 2016-06-30 DIAGNOSIS — D509 Iron deficiency anemia, unspecified: Secondary | ICD-10-CM | POA: Diagnosis not present

## 2016-06-30 DIAGNOSIS — R0602 Shortness of breath: Secondary | ICD-10-CM

## 2016-06-30 DIAGNOSIS — H538 Other visual disturbances: Secondary | ICD-10-CM | POA: Diagnosis not present

## 2016-06-30 DIAGNOSIS — R519 Headache, unspecified: Secondary | ICD-10-CM

## 2016-06-30 DIAGNOSIS — Z87898 Personal history of other specified conditions: Secondary | ICD-10-CM

## 2016-06-30 LAB — TROPONIN I

## 2016-06-30 MED ORDER — ASPIRIN 81 MG PO CHEW
81.0000 mg | CHEWABLE_TABLET | Freq: Every day | ORAL | Status: DC
  Administered 2016-06-30 – 2016-07-01 (×2): 81 mg via ORAL
  Filled 2016-06-30 (×2): qty 1

## 2016-06-30 MED ORDER — HYDRALAZINE HCL 20 MG/ML IJ SOLN: 10.0000 mg | Freq: Four times a day (QID) | INTRAMUSCULAR | Status: DC | PRN

## 2016-06-30 MED ORDER — SODIUM CHLORIDE 0.9% FLUSH
3.0000 mL | Freq: Two times a day (BID) | INTRAVENOUS | Status: DC
  Administered 2016-06-30 – 2016-07-01 (×3): 3 mL via INTRAVENOUS

## 2016-06-30 MED ORDER — METOPROLOL TARTRATE 50 MG PO TABS
50.0000 mg | ORAL_TABLET | Freq: Two times a day (BID) | ORAL | Status: DC
  Administered 2016-06-30 – 2016-07-01 (×3): 50 mg via ORAL
  Filled 2016-06-30: qty 1
  Filled 2016-06-30: qty 2
  Filled 2016-06-30: qty 1

## 2016-06-30 MED ORDER — HYDRALAZINE HCL 50 MG PO TABS
25.0000 mg | ORAL_TABLET | Freq: Once | ORAL | Status: AC
  Administered 2016-06-30: 25 mg via ORAL
  Filled 2016-06-30: qty 1

## 2016-06-30 MED ORDER — HYDROCHLOROTHIAZIDE 25 MG PO TABS
25.0000 mg | ORAL_TABLET | Freq: Every day | ORAL | Status: DC
  Administered 2016-06-30 – 2016-07-01 (×2): 25 mg via ORAL
  Filled 2016-06-30 (×2): qty 1

## 2016-06-30 MED ORDER — IBUPROFEN 400 MG PO TABS: 600.0000 mg | ORAL_TABLET | Freq: Once | ORAL | Status: DC

## 2016-06-30 MED ORDER — ONDANSETRON HCL 4 MG PO TABS
4.0000 mg | ORAL_TABLET | Freq: Four times a day (QID) | ORAL | Status: DC | PRN
Start: 2016-06-30 — End: 2016-07-01

## 2016-06-30 MED ORDER — ACETAMINOPHEN 325 MG PO TABS: 650.0000 mg | ORAL_TABLET | Freq: Four times a day (QID) | ORAL | Status: DC | PRN

## 2016-06-30 MED ORDER — PRAVASTATIN SODIUM 20 MG PO TABS
10.0000 mg | ORAL_TABLET | Freq: Every day | ORAL | Status: DC
  Administered 2016-06-30 – 2016-07-01 (×2): 10 mg via ORAL
  Filled 2016-06-30 (×3): qty 1

## 2016-06-30 MED ORDER — ONDANSETRON HCL 4 MG/2ML IJ SOLN: 4.0000 mg | Freq: Four times a day (QID) | INTRAMUSCULAR | Status: DC | PRN

## 2016-06-30 MED ORDER — DILTIAZEM HCL ER COATED BEADS 120 MG PO CP24
120.0000 mg | ORAL_CAPSULE | Freq: Every day | ORAL | Status: DC
  Administered 2016-06-30 – 2016-07-01 (×2): 120 mg via ORAL
  Filled 2016-06-30 (×2): qty 1

## 2016-06-30 MED ORDER — ENOXAPARIN SODIUM 40 MG/0.4ML ~~LOC~~ SOLN
40.0000 mg | SUBCUTANEOUS | Status: DC
  Administered 2016-06-30 – 2016-07-01 (×2): 40 mg via SUBCUTANEOUS
  Filled 2016-06-30 (×3): qty 0.4

## 2016-06-30 MED ORDER — LISINOPRIL 20 MG PO TABS: 20.0000 mg | ORAL_TABLET | Freq: Once | ORAL | Status: DC

## 2016-06-30 MED ORDER — HYDRALAZINE HCL 25 MG PO TABS: 25.0000 mg | ORAL_TABLET | Freq: Once | ORAL | Status: DC

## 2016-06-30 MED ORDER — ACETAMINOPHEN 650 MG RE SUPP: 650.0000 mg | Freq: Four times a day (QID) | RECTAL | Status: DC | PRN

## 2016-06-30 NOTE — Telephone Encounter (Signed)
[facility] [place] and they wanted to verify which ones she is currently taking. They had a script for Amlodipine but that is not on her current medication list and at the time of the call she was inpatient and they did not have it listed on her inpatient med list either. Pharmacist aware and will d/c amlodipine.

## 2016-06-30 NOTE — Progress Notes (Signed)
Pt received from ED. Pt oriented to room and equipment. Pt son and sister at bedside. Telemetry applied, CCMD notified x2. Call light within reach, will continue to monitor.   Fritz Pickerel, RN

## 2016-06-30 NOTE — ED Notes (Signed)
Pt is sleeping now, will give meds once she wakes up.

## 2016-06-30 NOTE — ED Notes (Signed)
Ordered breakfast tray at 0610- ty

## 2016-06-30 NOTE — ED Notes (Signed)
Pt ambulatory w/ steady gait to restroom, 1 staff stand-by assist.

## 2016-06-30 NOTE — ED Notes (Signed)
Attempted report to 2W °

## 2016-06-30 NOTE — ED Provider Notes (Signed)
[facility] DEPT Provider Note   CSN: [id number] Arrival date & time: 06/29/16  2017  By signing my name below, I, Johnney Killian, attest that this documentation has been prepared under the direction and in the presence of Varney Biles, MD. Electronically Signed: Johnney Killian, ED Scribe. 06/30/16. 3:28 AM.   History   Chief Complaint Chief Complaint  Patient presents with  . Hypertension    HPI Comments: JUNIOR GUYETTE is a hypertensive 82 y.o. female who presents to the Emergency Department due to pain in the arm, neck, and jaw worsening in the last 3 days. Pain is intermittent and unprovoked.  Pt says she has experienced shortness of breath and intermittent vision changes. Pt says her highest measured BP is 220/95. Pt denies new medications or changes in blood pressure medication. She says she has had recent glaucoma surgery. Pt says she is followed by a cardiologist as well as her PCP. Pt denies history of CVA, MI, blood clots, history of recent surgery, or recent periods of immobility. Pt says she takes no hormone replacement therapy. Pt also mentions associated intermittent leg pain precipitated by ambulation and some dizziness.   The history is provided by the patient and a relative. No language interpreter was used.    Past Medical History:  Diagnosis Date  . Anxiety   . Arthritis   . Carotid artery disease ([place])   . Cataract   . Depression   . Diverticulosis of colon with hemorrhage   . Duodenitis   . Esophageal reflux   . Esophageal stricture   . Essential hypertension   . Glaucoma   . Hiatal hernia   . Hyperlipidemia   . IBS (irritable bowel syndrome)   . Palpitations   . UTI (urinary tract infection) March 2015    Patient Active Problem List   Diagnosis Date Noted  . Hypertensive urgency 06/30/2016  . History of palpitations 06/30/2016  . Blurry vision, bilateral   . Glaucoma 05/25/2016  . Thyroid nodule 05/20/2015  . Carotid stenosis, non-symptomatic  09/14/2012  . Internal and external hemorrhoids without complication 99991111  . Vitamin B12 deficiency 09/15/2011  . Benign hypertensive heart disease without heart failure 05/22/2011  . Pure hypercholesterolemia 05/22/2011  . Stricture esophagus 05/18/2011  . Stricture of duodenum 05/18/2011  . Iron deficiency anemia 05/15/2011  . DIVERTICULOSIS-COLON 02/03/2008    Past Surgical History:  Procedure Laterality Date  . CHOLECYSTECTOMY    . DILATION AND CURETTAGE OF UTERUS    . LEFT ROTATOR CUFF REPAIR X2    . RIGHT ROTATOR CUFF REPAIR X1      OB History    No data available       Home Medications    Prior to Admission medications   Medication Sig Start Date End Date Taking? Authorizing Provider  aspirin 81 MG tablet Take 81 mg by mouth daily.     Yes Historical Provider, MD  diltiazem (CARDIZEM CD) 120 MG 24 hr capsule Take 1 capsule (120 mg total) by mouth daily. 06/02/16  Yes Satira Sark, MD  esomeprazole (NEXIUM) 40 MG capsule Take 1 capsule (40 mg total) by mouth daily before breakfast. Patient taking differently: Take 40 mg by mouth daily as needed (acid reflux).  09/13/13  Yes Sable Feil, MD  hydrochlorothiazide (HYDRODIURIL) 25 MG tablet Take 1 tablet (25 mg total) by mouth daily. 06/03/16  Yes Satira Sark, MD  metoprolol (LOPRESSOR) 50 MG tablet Take 1 tablet (50 mg total) by mouth 2 (two)  times daily. 02/04/16  Yes Satira Sark, MD  nitroGLYCERIN (NITROSTAT) 0.4 MG SL tablet Place 1 tablet (0.4 mg total) under the tongue every 5 (five) minutes as needed for chest pain. 01/22/14  Yes Darlin Coco, MD  pravastatin (PRAVACHOL) 10 MG tablet Take 1 tablet (10 mg total) by mouth daily. 05/22/16  Yes Satira Sark, MD  Vitamin D, Ergocalciferol, (DRISDOL) 50000 units CAPS capsule Take 1 capsule (50,000 Units total) by mouth 2 (two) times a week. 06/18/16  Yes Claretta Fraise, MD    Family History Family History  Problem Relation Age of Onset  .  Leukemia Mother   . Colon cancer Father 49  . Kidney cancer Brother   . Bladder Cancer Brother   . Stroke Sister   . Esophageal cancer Neg Hx   . Stomach cancer Neg Hx     Social History Social History  Substance Use Topics  . Smoking status: Never Smoker  . Smokeless tobacco: Never Used  . Alcohol use No     Allergies   Lisinopril and Sulfa antibiotics   Review of Systems Review of Systems  Eyes: Positive for visual disturbance.  Respiratory: Positive for shortness of breath.   Cardiovascular: Positive for chest pain.  Musculoskeletal: Positive for neck pain.   10 Systems reviewed and are negative for acute change except as noted in the HPI.    Physical Exam Updated Vital Signs BP (!) 175/61 (BP Location: Left Arm) Comment: RN notified  Pulse 66   Temp 98.4 F (36.9 C) (Oral)   Resp 18   Ht 5\' 4"  (1.626 m)   Wt 120 lb (54.4 kg)   SpO2 100%   BMI 20.60 kg/m   Physical Exam  Constitutional: She is oriented to person, place, and time. She appears well-developed and well-nourished. No distress.  HENT:  Head: Normocephalic and atraumatic.  Right Ear: Hearing normal.  Left Ear: Hearing normal.  Nose: Nose normal.  Mouth/Throat: Oropharynx is clear and moist and mucous membranes are normal.  Eyes: Conjunctivae and EOM are normal. Pupils are equal, round, and reactive to light.  Neck: Normal range of motion. Neck supple. JVD present.  Cardiovascular: Normal rate, regular rhythm, S1 normal, S2 normal and intact distal pulses.  Exam reveals no gallop and no friction rub.   No murmur heard. 2+ equal DP pulses bilaterally  Pulmonary/Chest: Effort normal and breath sounds normal. No respiratory distress. She exhibits no tenderness.  Abdominal: Soft. Normal appearance and bowel sounds are normal. She exhibits no mass. There is no hepatosplenomegaly. There is no tenderness. There is no rebound, no guarding, no tenderness at McBurney's point and negative Murphy's sign. No  hernia.  Abdomen soft, no palpable mass  Musculoskeletal: Normal range of motion. She exhibits tenderness. She exhibits no edema.  No pitting edema LLE calf tenderness  Neurological: She is alert and oriented to person, place, and time. She has normal strength. No cranial nerve deficit or sensory deficit. Coordination normal. GCS eye subscore is 4. GCS verbal subscore is 5. GCS motor subscore is 6.  Pupils unequal: left 4 mm, right 2 mm Both pupils reactive to light  Cranial nerves II through XII intact Upper an lower sensation normal Upper and lower motor nerve effort normal Cerebellar exam shows no dysmetria  Skin: Skin is warm, dry and intact. No rash noted. No cyanosis.  Psychiatric: She has a normal mood and affect. Her speech is normal and behavior is normal. Thought content normal.  Nursing note and  vitals reviewed.    ED Treatments / Results   DIAGNOSTIC STUDIES: Oxygen Saturation is 96% on RA, adequate by my interpretation.    COORDINATION OF CARE: 3:05 AM Discussed treatment plan with pt and family at bedside and pt and family agreed to plan.   Labs (all labs ordered are listed, but only abnormal results are displayed) Labs Reviewed  COMPREHENSIVE METABOLIC PANEL - Abnormal; Notable for the following:       Result Value   ALT 13 (*)    All other components within normal limits  URINALYSIS, ROUTINE W REFLEX MICROSCOPIC (NOT AT [facility]) - Abnormal; Notable for the following:    Color, Urine STRAW (*)    Hgb urine dipstick TRACE (*)    All other components within normal limits  URINE MICROSCOPIC-ADD ON - Abnormal; Notable for the following:    Squamous Epithelial / LPF 0-5 (*)    Bacteria, UA FEW (*)    All other components within normal limits  CBC WITH DIFFERENTIAL/PLATELET  TROPONIN I  BASIC METABOLIC PANEL    EKG  EKG Interpretation  Date/Time:  Monday June 29 2016 20:41:08 EDT Ventricular Rate:  46 PR Interval:  194 QRS Duration: 80 QT  Interval:  506 QTC Calculation: 442 R Axis:   63 Text Interpretation:  Sinus bradycardia Otherwise normal ECG No acute changes No significant change since last tracing Confirmed by Kathrynn Humble, MD, Johanna Stafford ([id number]) on 06/30/2016 3:12:01 AM       Radiology Ct Head Wo Contrast  Result Date: 06/30/2016 CLINICAL DATA:  Elevated blood pressure with vision change and dizziness. EXAM: CT HEAD WITHOUT CONTRAST TECHNIQUE: Contiguous axial images were obtained from the base of the skull through the vertex without intravenous contrast. COMPARISON:  05/16/2015 FINDINGS: Brain: Mild diffuse cerebral atrophy. No ventricular dilatation. Patchy low-attenuation changes in the deep white matter consistent with small vessel ischemia. No evidence of acute infarction, hemorrhage, hydrocephalus, extra-axial collection or mass lesion/mass effect. Vascular: Atherosclerotic vascular calcifications are present. Skull: Normal. Negative for fracture or focal lesion. Sinuses/Orbits: Postoperative changes in the orbits bilaterally. Visualized paranasal sinuses and mastoid air cells are not opacified. Other: No significant changes since the previous study. IMPRESSION: No acute intracranial abnormalities. Mild chronic atrophy and small vessel ischemic changes. Electronically Signed   By: Lucienne Capers M.D.   On: 06/30/2016 04:23    Procedures Procedures (including critical care time)  Medications Ordered in ED Medications  hydrALAZINE (APRESOLINE) tablet 25 mg (0 mg Oral Hold 06/30/16 0450)  diltiazem (CARDIZEM CD) 24 hr capsule 120 mg (120 mg Oral Given 06/30/16 1110)  hydrochlorothiazide (HYDRODIURIL) tablet 25 mg (25 mg Oral Given 06/30/16 1108)  metoprolol tartrate (LOPRESSOR) tablet 50 mg (50 mg Oral Given 06/30/16 2102)  aspirin chewable tablet 81 mg (81 mg Oral Given 06/30/16 1110)  pravastatin (PRAVACHOL) tablet 10 mg (10 mg Oral Given 06/30/16 1338)  enoxaparin (LOVENOX) injection 40 mg (40 mg Subcutaneous Given 06/30/16  1328)  sodium chloride flush (NS) 0.9 % injection 3 mL (3 mLs Intravenous Given 06/30/16 2102)  acetaminophen (TYLENOL) tablet 650 mg (not administered)    Or  acetaminophen (TYLENOL) suppository 650 mg (not administered)  ondansetron (ZOFRAN) tablet 4 mg (not administered)    Or  ondansetron (ZOFRAN) injection 4 mg (not administered)  hydrALAZINE (APRESOLINE) injection 10 mg (not administered)  hydrALAZINE (APRESOLINE) tablet 25 mg (25 mg Oral Given 06/30/16 0436)     Initial Impression / Assessment and Plan / ED Course  I have reviewed the triage vital  signs and the nursing notes.  Pertinent labs & imaging results that were available during my care of the patient were reviewed by me and considered in my medical decision making (see chart for details).  Clinical Course    Pt comes in with several complains - most notably she complains of headache, bilateral vision change and also some chest and jaw pain. Besides blurry vision, neuro exam is non focal. Cardiac eval in the ER is normal. BP is elevated - we are not sure what the etiology is. Her last clinic visits didn't have such high BP. No new meds or recent med adjustment.  ? Pt is having HTN emergency causing the vague chest discomfort and jaw pain and also the headache, vision  complains. CT head is normal. We will not aggressively manage BP in the ER, but pt will need prompt attn to the BP, and we are unable to get f/u.   Final Clinical Impressions(s) / ED Diagnoses   Final diagnoses:  Hypertensive urgency  Blurry vision, bilateral  Nonintractable headache, unspecified chronicity pattern, unspecified headache type    New Prescriptions Current Discharge Medication List         Varney Biles, MD 06/30/16 2317

## 2016-06-30 NOTE — H&P (Signed)
History and Physical    Valerie [name] [id number] DOB: 24-Mar-1928 DOA: 06/30/2016   PCP: Claretta Fraise, MD   Patient coming from/Resides with: Private residence/lives with grandson  Admission status: Observation/telemetry  Chief Complaint: Poorly controlled blood pressure  HPI: Valerie [name] is a 82 y.o. female with medical history significant for hypertension, palpitations, glaucoma, dyslipidemia and iron deficiency anemia. Patient reported to the ER physician that she has been having pain in her arm that control worsening for 3 days this is been associated with shortness of breath and vision changes. She was following her blood pressure measurements at home and they were elevated with an average of 220/95. She has not had any new medication changes but she did undergo glaucoma surgery and was prescribed eyedrops. In my discussion with the patient she reports for 2 days prior to presentation she felt "awful". She describes having a significant headache and poor oral intake and felt like "my eyes were bulging and swelling". She noted visual changes post her glaucoma surgery that had worsened with the symptoms and more manifested as bilateral blurred vision. She also reports becoming quite concerned over these visual changes despite being informed prior to the procedure that it may take several months for her vision to improve. She reports increasing anxiety and nervousness postoperatively. Patient thinks that her anxiousness may have contributed to her presenting symptomatology.  ED Course:  Vital Signs: BP (!) 119/51   Pulse 69   Temp 98.1 F (36.7 C) (Oral)   Resp 19   Ht 5\' 4"  (1.626 m)   Wt 54.4 kg (120 lb)   SpO2 96%   BMI 20.60 kg/m  CT head without contrast: No acute intracranial abnormalities Lab data: Sodium 138, potassium 4.0, CO2 27, BUN 14, creatinine 0.74, anion gap 7, LFTs normal, troponin less than 0.03, WBC 7300 with normal differential, hemoglobin 14.2, platelets 356,000,  urinalysis unremarkable Medications and treatments: Hydralazine 25 mg by mouth 1  Review of Systems:  In addition to the HPI above,  No Fever-chills, myalgias or other constitutional symptoms No Headache, changes with hearing, new weakness, tingling, numbness in any extremity, dizziness, dysarthria or word finding difficulty, gait disturbance or imbalance, tremors or seizure activity No problems swallowing food or Liquids, indigestion/reflux, choking or coughing while eating, abdominal pain with or after eating No Cough or Shortness of Breath, palpitations, orthopnea or DOE No Abdominal pain, N/V, melena,hematochezia, dark tarry stools No dysuria, malodorous urine, hematuria or flank pain No new skin rashes, lesions, masses or bruises, No new joint pains, aches, swelling or redness No recent unintentional weight gain or loss No polyuria, polydypsia or polyphagia   Past Medical History:  Diagnosis Date  . Anxiety   . Arthritis   . Carotid artery disease ([place])   . Cataract   . Depression   . Diverticulosis of colon with hemorrhage   . Duodenitis   . Esophageal reflux   . Esophageal stricture   . Essential hypertension   . Glaucoma   . Hiatal hernia   . Hyperlipidemia   . IBS (irritable bowel syndrome)   . Palpitations   . UTI (urinary tract infection) March 2015    Past Surgical History:  Procedure Laterality Date  . CHOLECYSTECTOMY    . DILATION AND CURETTAGE OF UTERUS    . LEFT ROTATOR CUFF REPAIR X2    . RIGHT ROTATOR CUFF REPAIR X1      Social History   Social History  . Marital status: Married  Spouse name: N/A  . Number of children: N/A  . Years of education: N/A   Occupational History  . retired Retired   Social History Main Topics  . Smoking status: Never Smoker  . Smokeless tobacco: Never Used  . Alcohol use No  . Drug use: No  . Sexual activity: Not on file   Other Topics Concern  . Not on file   Social History Narrative  . No narrative on  file    Mobility: Without assistive devices Work history: Not obtained   Allergies  Allergen Reactions  . Lisinopril Other (See Comments)    Hair loss  . Sulfa Antibiotics Nausea Only    Family History  Problem Relation Age of Onset  . Leukemia Mother   . Colon cancer Father 5  . Kidney cancer Brother   . Bladder Cancer Brother   . Stroke Sister   . Esophageal cancer Neg Hx   . Stomach cancer Neg Hx      Prior to Admission medications   Medication Sig Start Date End Date Taking? Authorizing Provider  aspirin 81 MG tablet Take 81 mg by mouth daily.     Yes Historical Provider, MD  diltiazem (CARDIZEM CD) 120 MG 24 hr capsule Take 1 capsule (120 mg total) by mouth daily. 06/02/16  Yes Satira Sark, MD  esomeprazole (NEXIUM) 40 MG capsule Take 1 capsule (40 mg total) by mouth daily before breakfast. Patient taking differently: Take 40 mg by mouth daily as needed (acid reflux).  09/13/13  Yes Sable Feil, MD  hydrochlorothiazide (HYDRODIURIL) 25 MG tablet Take 1 tablet (25 mg total) by mouth daily. 06/03/16  Yes Satira Sark, MD  metoprolol (LOPRESSOR) 50 MG tablet Take 1 tablet (50 mg total) by mouth 2 (two) times daily. 02/04/16  Yes Satira Sark, MD  nitroGLYCERIN (NITROSTAT) 0.4 MG SL tablet Place 1 tablet (0.4 mg total) under the tongue every 5 (five) minutes as needed for chest pain. 01/22/14  Yes Darlin Coco, MD  pravastatin (PRAVACHOL) 10 MG tablet Take 1 tablet (10 mg total) by mouth daily. 05/22/16  Yes Satira Sark, MD  Vitamin D, Ergocalciferol, (DRISDOL) 50000 units CAPS capsule Take 1 capsule (50,000 Units total) by mouth 2 (two) times a week. 06/18/16  Yes Claretta Fraise, MD    Physical Exam: Vitals:   06/30/16 0645 06/30/16 0700 06/30/16 0715 06/30/16 0900  BP: (!) 143/53 163/55 170/56 (!) 119/51  Pulse: 60 (!) 57 (!) 57 69  Resp: 16 19 17 19   Temp:      TempSrc:      SpO2: 96% 97% 97% 96%  Weight:      Height:           Constitutional: NAD, calm, comfortable Eyes: PERRL, lids and conjunctivae normal-Mild bilateral scleral injection ENMT: Mucous membranes are moist. Posterior pharynx clear of any exudate or lesions.Normal dentition.  Neck: normal, supple, no masses, no thyromegaly Respiratory: clear to auscultation bilaterally, no wheezing, no crackles. Normal respiratory effort. No accessory muscle use.  Cardiovascular: Regular rate and rhythm, soft grade 1/6 systolic murmur versus S4,  no rubs . No extremity edema. 2+ pedal pulses. No carotid bruits.  Abdomen: no tenderness, no masses palpated. No hepatosplenomegaly. Bowel sounds positive.  Musculoskeletal: no clubbing / cyanosis. No joint deformity upper and lower extremities. Good ROM, no contractures. Normal muscle tone.  Skin: no rashes, lesions, ulcers. No induration Neurologic: CN 2-12 grossly intact. Sensation intact, DTR normal. Strength 5/5 x all  4 extremities.  Psychiatric: Normal judgment and insight. Alert and oriented x 3. Normal mood.    Labs on Admission: I have personally reviewed following labs and imaging studies  CBC:  Recent Labs Lab 06/29/16 2050  WBC 7.3  NEUTROABS 4.3  HGB 14.2  HCT 42.5  MCV 96.2  PLT [id number]   Basic Metabolic Panel:  Recent Labs Lab 06/29/16 2050  NA 138  K 4.0  CL 104  CO2 27  GLUCOSE 96  BUN 14  CREATININE 0.74  CALCIUM 9.8   GFR: Estimated Creatinine Clearance: 43.4 mL/min (by C-G formula based on SCr of 0.74 mg/dL). Liver Function Tests:  Recent Labs Lab 06/29/16 2050  AST 21  ALT 13*  ALKPHOS 68  BILITOT 0.8  PROT 6.9  ALBUMIN 4.1   No results for input(s): LIPASE, AMYLASE in the last 168 hours. No results for input(s): AMMONIA in the last 168 hours. Coagulation Profile: No results for input(s): INR, PROTIME in the last 168 hours. Cardiac Enzymes:  Recent Labs Lab 06/30/16 0400  TROPONINI <0.03   BNP (last 3 results) No results for input(s): PROBNP in the last  8760 hours. HbA1C: No results for input(s): HGBA1C in the last 72 hours. CBG: No results for input(s): GLUCAP in the last 168 hours. Lipid Profile: No results for input(s): CHOL, HDL, LDLCALC, TRIG, CHOLHDL, LDLDIRECT in the last 72 hours. Thyroid Function Tests: No results for input(s): TSH, T4TOTAL, FREET4, T3FREE, THYROIDAB in the last 72 hours. Anemia Panel: No results for input(s): VITAMINB12, FOLATE, FERRITIN, TIBC, IRON, RETICCTPCT in the last 72 hours. Urine analysis:    Component Value Date/Time   COLORURINE STRAW (A) 06/29/2016 2128   APPEARANCEUR CLEAR 06/29/2016 2128   APPEARANCEUR Clear 05/25/2016 1209   LABSPEC 1.008 06/29/2016 2128   PHURINE 6.0 06/29/2016 2128   GLUCOSEU NEGATIVE 06/29/2016 2128   HGBUR TRACE (A) 06/29/2016 2128   BILIRUBINUR NEGATIVE 06/29/2016 2128   BILIRUBINUR Negative 05/25/2016 1209   KETONESUR NEGATIVE 06/29/2016 2128   PROTEINUR NEGATIVE 06/29/2016 2128   UROBILINOGEN negative 11/08/2013 0908   UROBILINOGEN 0.2 11/30/2008 1105   NITRITE NEGATIVE 06/29/2016 2128   LEUKOCYTESUR NEGATIVE 06/29/2016 2128   LEUKOCYTESUR Negative 05/25/2016 1209   Sepsis Labs: @LABRCNTIP (procalcitonin:4,lacticidven:4) )No results found for this or any previous visit (from the past 240 hour(s)).   Radiological Exams on Admission: Ct Head Wo Contrast  Result Date: 06/30/2016 CLINICAL DATA:  Elevated blood pressure with vision change and dizziness. EXAM: CT HEAD WITHOUT CONTRAST TECHNIQUE: Contiguous axial images were obtained from the base of the skull through the vertex without intravenous contrast. COMPARISON:  05/16/2015 FINDINGS: Brain: Mild diffuse cerebral atrophy. No ventricular dilatation. Patchy low-attenuation changes in the deep white matter consistent with small vessel ischemia. No evidence of acute infarction, hemorrhage, hydrocephalus, extra-axial collection or mass lesion/mass effect. Vascular: Atherosclerotic vascular calcifications are present.  Skull: Normal. Negative for fracture or focal lesion. Sinuses/Orbits: Postoperative changes in the orbits bilaterally. Visualized paranasal sinuses and mastoid air cells are not opacified. Other: No significant changes since the previous study. IMPRESSION: No acute intracranial abnormalities. Mild chronic atrophy and small vessel ischemic changes. Electronically Signed   By: Lucienne Capers M.D.   On: 06/30/2016 04:23    EKG: (Independently reviewed) sinus bradycardia with ventricular rate 46 bpm, QTC 442 ms, no concerning ST segment or T-wave changes  Assessment/Plan Principal Problem:   Hypertensive urgency -Patient presents with symptomatic hypertension with presenting blood pressure of 210/77 despite taking multiple medications at home chronically -CT  head unremarkable and patient without focal neurological deficits -Patient describes recent increase in anxiety and stress post glaucoma procedure and this could be contributed factor to patient's uncontrolled hypertension -Continue preadmission meds: Cardizem CD, HCTZ and Lopressor -Had good response to oral hydralazine in ER current readings well controlled; given patient's advanced age and likelihood stress and anxiety may have precipitated hypertensive episode have opted to not continue this medication but instead monitor for recurrence of elevated BP not responsive to home medications-if this occurs recommend beginning hydralazine -Echocardiogram especially with history of (see cardiology outpatient documentation)  Active Problems:   History of palpitations -Patient denies palpitations prior to admission -Continue preadmission calcium channel blocker and beta blocker    Pure hypercholesterolemia -Continue preadmission Pravachol    Glaucoma -Continue preadmission eyedrops-need pharmacy to reconcile    Iron deficiency anemia -Current hemoglobin stable at 14      DVT prophylaxis: Lovenox Code Status: DO NOT RESUSCITATE Family  Communication: No family at bedside Disposition Plan: Anticipate discharge back preadmission home environment once medically stable Consults called: None    ELLIS,ALLISON L. ANP-BC [facility] Pager [phone]   If 7PM-7AM, please contact night-coverage [email or web address] Password [email or web address]  06/30/2016, 10:33 AM

## 2016-06-30 NOTE — ED Notes (Signed)
Pt ambulatory to restroom with 1 assist. 

## 2016-07-01 ENCOUNTER — Observation Stay (HOSPITAL_COMMUNITY): Payer: Medicare Other

## 2016-07-01 ENCOUNTER — Other Ambulatory Visit (HOSPITAL_COMMUNITY): Payer: Medicare Other

## 2016-07-01 DIAGNOSIS — H409 Unspecified glaucoma: Secondary | ICD-10-CM

## 2016-07-01 DIAGNOSIS — H538 Other visual disturbances: Secondary | ICD-10-CM

## 2016-07-01 DIAGNOSIS — I160 Hypertensive urgency: Secondary | ICD-10-CM

## 2016-07-01 LAB — BASIC METABOLIC PANEL
ANION GAP: 9 (ref 5–15)
BUN: 14 mg/dL (ref 6–20)
CHLORIDE: 104 mmol/L (ref 101–111)
CO2: 25 mmol/L (ref 22–32)
Calcium: 9.7 mg/dL (ref 8.9–10.3)
Creatinine, Ser: 0.71 mg/dL (ref 0.44–1.00)
GFR calc non Af Amer: >60 mL/min (ref >60)
Glucose, Bld: 86 mg/dL (ref 65–99)
POTASSIUM: 3.3 mmol/L — AB (ref 3.5–5.1)
SODIUM: 138 mmol/L (ref 135–145)

## 2016-07-01 MED ORDER — ESOMEPRAZOLE MAGNESIUM 40 MG PO CPDR: 40.0000 mg | DELAYED_RELEASE_CAPSULE | Freq: Every day | ORAL | Status: DC | PRN

## 2016-07-01 MED ORDER — POTASSIUM CHLORIDE CRYS ER 20 MEQ PO TBCR
40.0000 meq | EXTENDED_RELEASE_TABLET | Freq: Once | ORAL | Status: AC
  Administered 2016-07-01: 40 meq via ORAL
  Filled 2016-07-01: qty 2

## 2016-07-01 NOTE — Discharge Summary (Signed)
Physician Discharge Summary  Valerie [name] [id number] DOB: 17-Nov-1927  PCP: Claretta Fraise, MD  Admit date: 06/30/2016 Discharge date: 07/01/2016  Admitted From: Home Disposition:  Home  Recommendations for Outpatient Follow-up:  1. Dr. Claretta Fraise, PCP in 3 days with repeat labs (BMP) 2. Dr. Mertie Moores, Cardiology in 1 week 3. Dr. Marlou Sa, Opthalmology : advised to call to be seen asap re follow up of vision problems from b/l eye surgery ~ 3 months.  Home Health: None Equipment/Devices: None    Discharge Condition: Improved and stable  CODE STATUS: DNR  Diet recommendation: Heart Healthy diet  Discharge Diagnoses:  Principal Problem:   Hypertensive urgency Active Problems:   Iron deficiency anemia   Pure hypercholesterolemia   Glaucoma   History of palpitations   Brief/Interim Summary: 82 year old female patient with PMH of HTN, HLD, anxiety, depression, GERD, CAD, glaucoma-underwent intervention approximately 3 months ago, lives alone but has family living next door who provide close supervision, presented to [facility] ED on 06/30/16 with complaints of ongoing blurred vision for several weeks post procedure,? Intermittent dyspnea and arm pain. She checks her blood pressures regularly at home and apparently were elevated in the 220/95 range despite compliance with medications. She apparently has not felt well for a couple of days, headache, poor oral intake and visual changes since surgery for which she has recently been seen by her ophthalmologist and has follow-up in 2 months. In the ED, CT head without acute abnormalities, initial blood pressure 220/77. Admitted for hypertensive urgency.  Assessment and plan  Hypertensive urgency - Initial blood pressure 210/77 mmHg despite claiming compliance with home medications. - May have been precipitated by anxiety and stress lately. - She was resumed on her home antihypertensives and her blood pressures were reasonably controlled in the  140s-150/40s-50s range - CT head without acute findings and no focal neurological deficits. - Recommended continuing previous home antihypertensives, maintaining a log of her daily blood pressure checks, low-salt diet and close outpatient follow-up with her PCP. She and daughter verbalized understanding. - EKG showed sinus bradycardia at 46 and no acute changes. Can follow-up with PCP or cardiology regarding outpatient echo.  Hypokalemia - Replaced prior to discharge. Outpatient follow-up with BMP.  ?? Dyspnea - Patient is not very clear about this and unable to get more details. Denies chest pain or left arm pain. Clinically clear on exam and chest x-ray without acute findings.  Glaucoma status post bilateral interventions -As per daughter and patient, patient has had issues with her vision ever since her surgery. Recommended outpatient follow-up with Dr. Marlou Sa, ophthalmology.   Hypokalemia - Replaced prior to discharge.   Consultations: None  Procedures: None   Discharge Instructions  Discharge Instructions    Call MD for:    Complete by:  As directed    Worsening problems with vision.   Call MD for:  difficulty breathing, headache or visual disturbances    Complete by:  As directed    Call MD for:  extreme fatigue    Complete by:  As directed    Call MD for:  persistant dizziness or light-headedness    Complete by:  As directed    Call MD for:  severe uncontrolled pain    Complete by:  As directed    Diet - low sodium heart healthy    Complete by:  As directed    Increase activity slowly    Complete by:  As directed        Medication List  TAKE these medications   aspirin 81 MG tablet Take 81 mg by mouth daily.   diltiazem 120 MG 24 hr capsule Commonly known as:  CARDIZEM CD Take 1 capsule (120 mg total) by mouth daily.   esomeprazole 40 MG capsule Commonly known as:  NEXIUM Take 1 capsule (40 mg total) by mouth daily as needed (acid reflux).    hydrochlorothiazide 25 MG tablet Commonly known as:  HYDRODIURIL Take 1 tablet (25 mg total) by mouth daily.   metoprolol 50 MG tablet Commonly known as:  LOPRESSOR Take 1 tablet (50 mg total) by mouth 2 (two) times daily.   nitroGLYCERIN 0.4 MG SL tablet Commonly known as:  NITROSTAT Place 1 tablet (0.4 mg total) under the tongue every 5 (five) minutes as needed for chest pain.   pravastatin 10 MG tablet Commonly known as:  PRAVACHOL Take 1 tablet (10 mg total) by mouth daily.   Vitamin D (Ergocalciferol) 50000 units Caps capsule Commonly known as:  DRISDOL Take 1 capsule (50,000 Units total) by mouth 2 (two) times a week.      Follow-up Information    STACKS,WARREN, MD Follow up in 3 day(s).   Specialty:  Family Medicine Why:  To be seen with repeat labs (BMP). Follow BP control. Can be seen at Dr. Elmarie Shiley office if they can accomodate you. Contact information: [place] [address] [phone]        Dr. Bernestine Amass Doctor. Schedule an appointment as soon as possible for a visit today.   Why:  Please call re ongoing issues with vision problems after recent eye surgery.       Mertie Moores, MD. Schedule an appointment as soon as possible for a visit in 1 week(s).   Specialty:  Cardiology Contact information: [address] [place] [address] [phone]          Allergies  Allergen Reactions  . Lisinopril Other (See Comments)    Hair loss  . Sulfa Antibiotics Nausea Only     Procedures/Studies: Ct Head Wo Contrast  Result Date: 06/30/2016 CLINICAL DATA:  Elevated blood pressure with vision change and dizziness. EXAM: CT HEAD WITHOUT CONTRAST TECHNIQUE: Contiguous axial images were obtained from the base of the skull through the vertex without intravenous contrast. COMPARISON:  05/16/2015 FINDINGS: Brain: Mild diffuse cerebral atrophy. No ventricular dilatation. Patchy low-attenuation changes in the deep white matter  consistent with small vessel ischemia. No evidence of acute infarction, hemorrhage, hydrocephalus, extra-axial collection or mass lesion/mass effect. Vascular: Atherosclerotic vascular calcifications are present. Skull: Normal. Negative for fracture or focal lesion. Sinuses/Orbits: Postoperative changes in the orbits bilaterally. Visualized paranasal sinuses and mastoid air cells are not opacified. Other: No significant changes since the previous study. IMPRESSION: No acute intracranial abnormalities. Mild chronic atrophy and small vessel ischemic changes. Electronically Signed   By: Lucienne Capers M.D.   On: 06/30/2016 04:23   Dg Chest Port 1 View  Result Date: 07/01/2016 CLINICAL DATA:  Shortness of Breath EXAM: PORTABLE CHEST 1 VIEW COMPARISON:  Feb 12, 2014 FINDINGS: There is no edema or consolidation. Heart size and pulmonary vascularity are normal. No adenopathy. There is atherosclerotic calcification in the aorta. There are total shoulder replacements bilaterally. IMPRESSION: There is no edema or consolidation. There is aortic atherosclerosis. Electronically Signed   By: Lowella Grip III M.D.   On: 07/01/2016 17:15      Subjective: Feels better. Denies dyspnea. Chronic intermittent blurred vision since I surgery-follows with ophthalmologist as  outpatient. No chest pain, arm pain, dizziness or lightheadedness reported. Daughter at bedside.  Discharge Exam:  Vitals:   06/30/16 2039 07/01/16 0446 07/01/16 1000 07/01/16 1411  BP: (!) 175/61 (!) 150/58 (!) 149/53 (!) 146/49  Pulse: 66 60 (!) 58 (!) 52  Resp: 18 18  18   Temp: 98.4 F (36.9 C) 98.2 F (36.8 C)  97.7 F (36.5 C)  TempSrc: Oral Oral  Oral  SpO2: 100% 96%  98%  Weight:      Height:        General: Pt sitting up comfortably in chair. Eyes: Right pupil 2 mm reacting to light, left pupil? Surgically irregular. Reacting to light. Vision appears unremarkable on gross testing. Cardiovascular: S1 & S2 heard, RRR, S1/S2 +.  No murmurs, rubs, gallops or clicks. No JVD or pedal edema.Telemetry: Sinus bradycardia in the 50s. No arrhythmias noted.  Respiratory: Clear to auscultation without wheezing, rhonchi or crackles. No increased work of breathing. Abdominal:  Non distended, non tender & soft. No organomegaly or masses appreciated. Normal bowel sounds heard. CNS: Alert and oriented. No focal deficits. Extremities: no edema, no cyanosis    The results of significant diagnostics from this hospitalization (including imaging, microbiology, ancillary and laboratory) are listed below for reference.     Microbiology: No results found for this or any previous visit (from the past 240 hour(s)).   Labs: BNP (last 3 results) No results for input(s): BNP in the last 8760 hours. Basic Metabolic Panel:  Recent Labs Lab 06/29/16 2050 07/01/16 0239  NA 138 138  K 4.0 3.3*  CL 104 104  CO2 27 25  GLUCOSE 96 86  BUN 14 14  CREATININE 0.74 0.71  CALCIUM 9.8 9.7   Liver Function Tests:  Recent Labs Lab 06/29/16 2050  AST 21  ALT 13*  ALKPHOS 68  BILITOT 0.8  PROT 6.9  ALBUMIN 4.1   No results for input(s): LIPASE, AMYLASE in the last 168 hours. No results for input(s): AMMONIA in the last 168 hours. CBC:  Recent Labs Lab 06/29/16 2050  WBC 7.3  NEUTROABS 4.3  HGB 14.2  HCT 42.5  MCV 96.2  PLT 356   Cardiac Enzymes:  Recent Labs Lab 06/30/16 0400  TROPONINI <0.03   Urinalysis    Component Value Date/Time   COLORURINE STRAW (A) 06/29/2016 2128   APPEARANCEUR CLEAR 06/29/2016 2128   APPEARANCEUR Clear 05/25/2016 1209   LABSPEC 1.008 06/29/2016 2128   PHURINE 6.0 06/29/2016 2128   GLUCOSEU NEGATIVE 06/29/2016 2128   HGBUR TRACE (A) 06/29/2016 2128   BILIRUBINUR NEGATIVE 06/29/2016 2128   BILIRUBINUR Negative 05/25/2016 1209   KETONESUR NEGATIVE 06/29/2016 2128   PROTEINUR NEGATIVE 06/29/2016 2128   UROBILINOGEN negative 11/08/2013 0908   UROBILINOGEN 0.2 11/30/2008 1105   NITRITE  NEGATIVE 06/29/2016 2128   LEUKOCYTESUR NEGATIVE 06/29/2016 2128   LEUKOCYTESUR Negative 05/25/2016 1209      Time coordinating discharge: Over 30 minutes  SIGNED:  Vernell Leep, MD, FACP, FHM. [facility] Pager [phone]  If 7PM-7AM, please contact night-coverage [email or web address] Password [email or web address] 07/01/2016, 7:18 PM

## 2016-07-01 NOTE — Care Management Obs Status (Signed)
[facility] NOTIFICATION   Patient Details  Name: Valerie [name] MRN: [id number] Date of Birth: [birth year]   Medicare Observation Status Notification Given:  Yes    Dawayne Patricia, RN 07/01/2016, 4:13 PM

## 2016-07-01 NOTE — Discharge Instructions (Signed)
Hypertension Hypertension, commonly called high blood pressure, is when the force of blood pumping through your arteries is too strong. Your arteries are the blood vessels that carry blood from your heart throughout your body. A blood pressure reading consists of a higher number over a lower number, such as 110/72. The higher number (systolic) is the pressure inside your arteries when your heart pumps. The lower number (diastolic) is the pressure inside your arteries when your heart relaxes. Ideally you want your blood pressure below 120/80. Hypertension forces your heart to work harder to pump blood. Your arteries may become narrow or stiff. Having untreated or uncontrolled hypertension can cause heart attack, stroke, kidney disease, and other problems. RISK FACTORS Some risk factors for high blood pressure are controllable. Others are not.  Risk factors you cannot control include:   Race. You may be at higher risk if you are African American.  Age. Risk increases with age.  Gender. Men are at higher risk than women before age 45 years. After age 65, women are at higher risk than men. Risk factors you can control include:  Not getting enough exercise or physical activity.  Being overweight.  Getting too much fat, sugar, calories, or salt in your diet.  Drinking too much alcohol. SIGNS AND SYMPTOMS Hypertension does not usually cause signs or symptoms. Extremely high blood pressure (hypertensive crisis) may cause headache, anxiety, shortness of breath, and nosebleed. DIAGNOSIS To check if you have hypertension, your health care provider will measure your blood pressure while you are seated, with your arm held at the level of your heart. It should be measured at least twice using the same arm. Certain conditions can cause a difference in blood pressure between your right and left arms. A blood pressure reading that is higher than normal on one occasion does not mean that you need treatment. If  it is not clear whether you have high blood pressure, you may be asked to return on a different day to have your blood pressure checked again. Or, you may be asked to monitor your blood pressure at home for 1 or more weeks. TREATMENT Treating high blood pressure includes making lifestyle changes and possibly taking medicine. Living a healthy lifestyle can help lower high blood pressure. You may need to change some of your habits. Lifestyle changes may include:  Following the DASH diet. This diet is high in fruits, vegetables, and whole grains. It is low in salt, red meat, and added sugars.  Keep your sodium intake below 2,300 mg per day.  Getting at least 30-45 minutes of aerobic exercise at least 4 times per week.  Losing weight if necessary.  Not smoking.  Limiting alcoholic beverages.  Learning ways to reduce stress. Your health care provider may prescribe medicine if lifestyle changes are not enough to get your blood pressure under control, and if one of the following is true:  You are 18-59 years of age and your systolic blood pressure is above 140.  You are 60 years of age or older, and your systolic blood pressure is above 150.  Your diastolic blood pressure is above 90.  You have diabetes, and your systolic blood pressure is over 140 or your diastolic blood pressure is over 90.  You have kidney disease and your blood pressure is above 140/90.  You have heart disease and your blood pressure is above 140/90. Your personal target blood pressure may vary depending on your medical conditions, your age, and other factors. HOME CARE INSTRUCTIONS    Have your blood pressure rechecked as directed by your health care provider.   Take medicines only as directed by your health care provider. Follow the directions carefully. Blood pressure medicines must be taken as prescribed. The medicine does not work as well when you skip doses. Skipping doses also puts you at risk for  problems.  Do not smoke.   Monitor your blood pressure at home as directed by your health care provider. SEEK MEDICAL CARE IF:   You think you are having a reaction to medicines taken.  You have recurrent headaches or feel dizzy.  You have swelling in your ankles.  You have trouble with your vision. SEEK IMMEDIATE MEDICAL CARE IF:  You develop a severe headache or confusion.  You have unusual weakness, numbness, or feel faint.  You have severe chest or abdominal pain.  You vomit repeatedly.  You have trouble breathing. MAKE SURE YOU:   Understand these instructions.  Will watch your condition.  Will get help right away if you are not doing well or get worse.   This information is not intended to replace advice given to you by your health care provider. Make sure you discuss any questions you have with your health care provider.   Document Released: 09/14/2005 Document Revised: 01/29/2015 Document Reviewed: 07/07/2013 Elsevier Interactive Patient Education 2016 Elsevier Inc.  

## 2016-07-01 NOTE — Evaluation (Signed)
Physical Therapy Evaluation Patient Details Name: KYMORA SPINNATO MRN: [id number] DOB: [birth year] Today's Date: 07/01/2016   History of Present Illness  Pt adm with hypertensive urgency. PMH - glaucoma with recent surgery, CAD, anxiety/depression.  Clinical Impression  Pt doing well with mobility and no further PT needed.  Ready for dc from PT standpoint.      Follow Up Recommendations No PT follow up    Equipment Recommendations  None recommended by PT    Recommendations for Other Services       Precautions / Restrictions Precautions Precautions: Fall (due to decr vision) Restrictions Weight Bearing Restrictions: No      Mobility  Bed Mobility               General bed mobility comments: Pt up in chair  Transfers Overall transfer level: Modified independent                  Ambulation/Gait Ambulation/Gait assistance: Modified independent (Device/Increase time) Ambulation Distance (Feet): 500 Feet Assistive device: None Gait Pattern/deviations: Step-through pattern;Drifts right/left   Gait velocity interpretation: at or above normal speed for age/gender General Gait Details: Drifts due to decr vision  Stairs            Wheelchair Mobility    Modified Rankin (Stroke Patients Only)       Balance Overall balance assessment: No apparent balance deficits (not formally assessed)                                           Pertinent Vitals/Pain Pain Assessment: No/denies pain    Home Living Family/patient expects to be discharged to:: Private residence Living Arrangements: Other (Comment) (grandson) Available Help at Discharge: Family;Available PRN/intermittently Type of Home: House Home Access: Stairs to enter Entrance Stairs-Rails: None Entrance Stairs-Number of Steps: 2 Home Layout: One level Home Equipment: None      Prior Function Level of Independence: Independent         Comments: Recent visual issues with  glaucoma has limited pt some     Hand Dominance        Extremity/Trunk Assessment   Upper Extremity Assessment: Overall WFL for tasks assessed           Lower Extremity Assessment: Overall WFL for tasks assessed         Communication   Communication: No difficulties  Cognition Arousal/Alertness: Awake/alert Behavior During Therapy: WFL for tasks assessed/performed Overall Cognitive Status: Within Functional Limits for tasks assessed                      General Comments      Exercises     Assessment/Plan    PT Assessment Patent does not need any further PT services  PT Problem List            PT Treatment Interventions      PT Goals (Current goals can be found in the Care Plan section)  Acute Rehab PT Goals PT Goal Formulation: All assessment and education complete, DC therapy    Frequency     Barriers to discharge        Co-evaluation               End of Session   Activity Tolerance: Patient tolerated treatment well Patient left: in chair;with family/visitor present Nurse Communication: Mobility status    Functional  Assessment Tool Used: clinical judgement Functional Limitation: Mobility: Walking and moving around Mobility: Walking and Moving Around Current Status [phone]): 0 percent impaired, limited or restricted Mobility: Walking and Moving Around Goal Status [phone]): 0 percent impaired, limited or restricted Mobility: Walking and Moving Around Discharge Status [phone]): 0 percent impaired, limited or restricted    Time: [id number] PT Time Calculation (min) (ACUTE ONLY): 12 min   Charges:   PT Evaluation $PT Eval Low Complexity: 1 Procedure     PT G Codes:   PT G-Codes **NOT FOR INPATIENT CLASS** Functional Assessment Tool Used: clinical judgement Functional Limitation: Mobility: Walking and moving around Mobility: Walking and Moving Around Current Status [id number]): 0 percent impaired, limited or restricted Mobility:  Walking and Moving Around Goal Status [id number]): 0 percent impaired, limited or restricted Mobility: Walking and Moving Around Discharge Status [phone]): 0 percent impaired, limited or restricted    Monseratt Ledin 07/01/2016, 4:16 PM [facility] PT [phone]

## 2016-07-01 NOTE — Progress Notes (Signed)
Pt has been discharged home. IV and telemetry box removed. Pt and pt's daughter received discharge instructions and all questions were answered. Pt left with all of her belongings. Pt left the unit via wheelchair and was accompanied by pt's daughter and a Chartered certified accountant. Pt was in no distress at time of discharge.  Grant Fontana BSN, RN

## 2016-07-06 ENCOUNTER — Telehealth: Payer: Self-pay | Admitting: Cardiology

## 2016-07-06 MED ORDER — NITROGLYCERIN 0.4 MG SL SUBL
0.4000 mg | SUBLINGUAL_TABLET | SUBLINGUAL | 3 refills | Status: DC | PRN
Start: 2016-07-06 — End: 2018-08-05

## 2016-07-06 NOTE — Telephone Encounter (Signed)
New message      Pt c/o medication issue:  1. Name of Medication: potassium 2. How are you currently taking this medication (dosage and times per day)?   3. Are you having a reaction (difficulty breathing--STAT)?   4. What is your medication issue? Pt keeps coming to the pharmacy to get a presc for potassium.  Is there a presc for her?

## 2016-07-06 NOTE — Telephone Encounter (Signed)
Called pharmacy to let them know that patient does not have a prescription for potassium at this time, and patient will be following up with cardiology this Friday.   Called patient to inform her that she does not have potassium ordered at this time. Patient stated that she has been having some SOB at times and some chest pain. Asked patient if she has taken any of her Nitroglycerin. Patient stated she does not have any more. Sent in a prescription for patient's nitro. Since it is late and patient has no one at home with her, informed patient to call 911 due to her chest pain and SOB. Patient agreed to plan and will call 911.

## 2016-07-09 NOTE — Progress Notes (Signed)
Cardiology Office Note   Date:  07/10/2016   ID:  DEVINA KECKLER, DOB 14-Jan-1928, MRN [id number]  PCP:  Claretta Fraise, MD  Cardiologist: Dr. Acie Fredrickson     Chief Complaint  Patient presents with  . Hospitalization Follow-up    improved BP      History of Present Illness: Valerie [name] is a 82 y.o. female who presents for post hospital for hypertensive urgency.   PMH of HTN, HLD, anxiety, depression, GERD, CAD, glaucoma-underwent intervention approximately 3 months ago, lives alone but has family living next door who provide close supervision, presented to [facility] ED on 06/30/16 with complaints of ongoing blurred vision for several weeks post procedure,? Intermittent dyspnea and arm pain. Along with chest pain that she states is arthritic.  She checks her blood pressures regularly at home and apparently were elevated in the 220/95 range despite compliance with medications. She apparently has not felt well for a couple of days, headache, poor oral intake and visual changes since surgery for which she has recently been seen by her ophthalmologist and has follow-up in 2 months. In the ED, CT head without acute abnormalities, initial blood pressure 220/77. Admitted for hypertensive urgency.  Her troponin was negative.  Hx of neg nuc study in 2015.    She had HR to 46 (though looking back she has been slow before)..  Meds adjusted and plan for outpt echo.  She had complained about SOB but was not clear today she does have at times.   Was with hypokalemia and follow up was with K+ 3.3.    Today she continues with episodes of dizziness at times, she is unsure if it is related to her eye surgery.  She does have blurred vision attributed to her eye surgery.    She has depression that also may be driving some of her symptoms.  She was on aricept that caused decreased appetite and severe weakness.  This has been stopped.  Today her BP log ranges from 149/77 to 175/64.  I have asked them to check HR as well.    No current chest pain.     Hx of carotid disease with last check in 09/2015 with 1-39% bil stenosis. To be seen by vascular in Dec.   Past Medical History:  Diagnosis Date  . Anxiety   . Arthritis   . Carotid artery disease ([place])   . Cataract   . Depression   . Diverticulosis of colon with hemorrhage   . Duodenitis   . Esophageal reflux   . Esophageal stricture   . Essential hypertension   . Glaucoma   . Hiatal hernia   . Hyperlipidemia   . IBS (irritable bowel syndrome)   . Palpitations   . UTI (urinary tract infection) March 2015    Past Surgical History:  Procedure Laterality Date  . CHOLECYSTECTOMY    . DILATION AND CURETTAGE OF UTERUS    . LEFT ROTATOR CUFF REPAIR X2    . RIGHT ROTATOR CUFF REPAIR X1       Current Outpatient Prescriptions  Medication Sig Dispense Refill  . aspirin 81 MG tablet Take 81 mg by mouth daily.      Marland Kitchen diltiazem (CARDIZEM CD) 120 MG 24 hr capsule Take 1 capsule (120 mg total) by mouth daily. 30 capsule 6  . esomeprazole (NEXIUM) 40 MG capsule Take 1 capsule (40 mg total) by mouth daily as needed (acid reflux).    . hydrochlorothiazide (HYDRODIURIL) 25 MG tablet Take 1  tablet (25 mg total) by mouth daily. 30 tablet 5  . metoprolol (LOPRESSOR) 50 MG tablet Take 1 tablet (50 mg total) by mouth 2 (two) times daily. 180 tablet 2  . nitroGLYCERIN (NITROSTAT) 0.4 MG SL tablet Place 1 tablet (0.4 mg total) under the tongue every 5 (five) minutes as needed for chest pain. 25 tablet 3  . pravastatin (PRAVACHOL) 10 MG tablet Take 1 tablet (10 mg total) by mouth daily. 90 tablet 2  . Vitamin D, Ergocalciferol, (DRISDOL) 50000 units CAPS capsule Take 1 capsule (50,000 Units total) by mouth 2 (two) times a week. 16 capsule 0   Current Facility-Administered Medications  Medication Dose Route Frequency Provider Last Rate Last Dose  . cyanocobalamin ((VITAMIN B-12)) injection 1,000 mcg  1,000 mcg Intramuscular Q30 days Claretta Fraise, MD   1,000 mcg at  05/25/16 1200    Allergies:   Lisinopril; Sulfa antibiotics; and Aricept [donepezil hcl]    Social History:  The patient  reports that she has never smoked. She has never used smokeless tobacco. She reports that she does not drink alcohol or use drugs.   Family History:  The patient's family history includes Bladder Cancer in her brother; Colon cancer (age of onset: 33) in her father; Kidney cancer in her brother; Leukemia in her mother; Stroke in her sister.    ROS:  General:no colds or fevers, + weight loss Skin:no rashes or ulcers HEENT:no blurred vision, no congestion, recent eye surgery CV:see HPI PUL:see HPI GI:no diarrhea constipation or melena, no indigestion GU:no hematuria, no dysuria MS:no joint pain, no claudication Neuro:no syncope, + lightheadedness Endo:no diabetes, no thyroid disease  Wt Readings from Last 3 Encounters:  07/10/16 114 lb (51.7 kg)  06/29/16 120 lb (54.4 kg)  05/25/16 119 lb (54 kg)     PHYSICAL EXAM: VS:  BP (!) 142/72   Pulse (!) 50   Ht 5\' 4"  (1.626 m)   Wt 114 lb (51.7 kg)   BMI 19.57 kg/m  , BMI Body mass index is 19.57 kg/m. General:Pleasant affect, NAD Skin:Warm and dry, brisk capillary refill HEENT:normocephalic, sclera clear, mucus membranes moist Neck:supple, no JVD, no bruits  Heart:S1S2 RRR with soft 2/3 systolic murmur, no gallup, rub or click Lungs:clear without rales, rhonchi, or wheezes [id number], non tender, + BS, do not palpate liver spleen or masses Ext:no lower ext edema, 2+ pedal pulses, 2+ radial pulses Neuro:alert and oriented X 3, MAE, follows commands, + facial symmetry    EKG:  EKG is NOT ordered today.   Recent Labs: 06/29/2016: ALT 13; Hemoglobin 14.2; Platelets 356 07/01/2016: BUN 14; Creatinine, Ser 0.71; Potassium 3.3; Sodium 138    Lipid Panel    Component Value Date/Time   CHOL 194 05/25/2016 1201   TRIG 137 05/25/2016 1201   HDL 54 05/25/2016 1201   CHOLHDL 3.6 05/25/2016 1201   CHOLHDL 4  12/05/2014 0946   VLDL 26.0 12/05/2014 0946   LDLCALC 113 (H) 05/25/2016 1201       Other studies Reviewed: Additional studies/ records that were reviewed today include: . Carotid Dopplers 10/17/2015: Stable 1-39% bilateral ICA stenoses.  Lexiscan Myoview 01/25/2014: Impression Exercise Capacity: Lexiscan with no exercise. BP Response: Normal blood pressure response. Clinical Symptoms: No significant symptoms noted. ECG Impression: No significant ST segment change suggestive of ischemia. Comparison with Prior Nuclear Study: No previous nuclear study performed  Overall Impression: Low risk stress nuclear study with lateral wall attenuation artifact. Gated images are not available, so a non-transmural lateral wall  infarction cannot be entirely excluded.. The defect is extensive, but mild and corresponds to expected arm attenuation.   ASSESSMENT AND PLAN:  1.  HTN still elevated at home but with low HR would like to stop cardizem and use amlodipine 5 mg.   2. Chronic hx of palpatitations  And bradycardia - stopping dilt and 48 hour holter to eval how slow she does go.  She has tolerated in the past but at her age this may be too slow.  3. Hx of cardiac murmur, previous echo 2009.  Will recheck Echo  4. Carotid disease followed by vascular.      Current medicines are reviewed with the patient today.  The patient Has no concerns regarding medicines.  The following changes have been made:  See above Labs/ tests ordered today include:see above  Disposition:   FU:  see above  Signed, Cecilie Kicks, NP  07/10/2016 8:33 AM    [facility], [place], [place] [facility] [place], [place] Phone: [phone]; Fax: [phone]

## 2016-07-10 ENCOUNTER — Encounter: Payer: Self-pay | Admitting: Cardiology

## 2016-07-10 ENCOUNTER — Ambulatory Visit (INDEPENDENT_AMBULATORY_CARE_PROVIDER_SITE_OTHER): Payer: Medicare Other | Admitting: Cardiology

## 2016-07-10 VITALS — BP 142/72 | HR 50 | Ht 64.0 in | Wt 114.0 lb

## 2016-07-10 DIAGNOSIS — I10 Essential (primary) hypertension: Secondary | ICD-10-CM

## 2016-07-10 DIAGNOSIS — R001 Bradycardia, unspecified: Secondary | ICD-10-CM | POA: Diagnosis not present

## 2016-07-10 DIAGNOSIS — Z23 Encounter for immunization: Secondary | ICD-10-CM

## 2016-07-10 DIAGNOSIS — H538 Other visual disturbances: Secondary | ICD-10-CM

## 2016-07-10 DIAGNOSIS — R42 Dizziness and giddiness: Secondary | ICD-10-CM | POA: Diagnosis not present

## 2016-07-10 DIAGNOSIS — R0602 Shortness of breath: Secondary | ICD-10-CM

## 2016-07-10 DIAGNOSIS — R011 Cardiac murmur, unspecified: Secondary | ICD-10-CM

## 2016-07-10 DIAGNOSIS — E876 Hypokalemia: Secondary | ICD-10-CM

## 2016-07-10 LAB — BASIC METABOLIC PANEL
BUN: 21 mg/dL (ref 7–25)
CHLORIDE: 104 mmol/L (ref 98–110)
CO2: 25 mmol/L (ref 20–31)
Calcium: 9.7 mg/dL (ref 8.6–10.4)
Creat: 0.8 mg/dL (ref 0.60–0.88)
Glucose, Bld: 93 mg/dL (ref 65–99)
POTASSIUM: 4 mmol/L (ref 3.5–5.3)
SODIUM: 139 mmol/L (ref 135–146)

## 2016-07-10 MED ORDER — AMLODIPINE BESYLATE 5 MG PO TABS: 5.0000 mg | ORAL_TABLET | Freq: Every day | ORAL | 0 refills | Status: DC

## 2016-07-10 NOTE — Patient Instructions (Addendum)
Medication Instructions:  STOP Diltiazem START Norvasc(Amlodopine) 5mg  Take 1 tab by mouth once a day  Labwork: Your physician recommends that you return for lab work in: TODAY-BMET  Testing/Procedures: Your physician has requested that you have an echocardiogram. Echocardiography is a painless test that uses sound waves to create images of your heart. It provides your doctor with information about the size and shape of your heart and how well your heart's chambers and valves are working. This procedure takes approximately one hour. There are no restrictions for this procedure.  Your physician has recommended that you wear a 48 hour holter monitor. Holter monitors are medical devices that record the heart's electrical activity. Doctors most often use these monitors to diagnose arrhythmias. Arrhythmias are problems with the speed or rhythm of the heartbeat. The monitor is a small, portable device. You can wear one while you do your normal daily activities. This is usually used to diagnose what is causing palpitations/syncope (passing out).  Follow-Up: Your physician recommends that you schedule a follow-up appointment in: 2 weeks with Cecilie Kicks OR with Dr Acie Fredrickson on 07/31/2016.   Any Other Special Instructions Will Be Listed Below (If Applicable).  Call the office if your blood pressure's top number is over 160.    If you need a refill on your cardiac medications before your next appointment, please call your pharmacy.

## 2016-07-24 ENCOUNTER — Telehealth: Payer: Self-pay | Admitting: Family Medicine

## 2016-07-24 NOTE — Telephone Encounter (Signed)
Aware , no need to take very much B-12 over the counter.  Her level was over 2000 on last lab result.Marland Kitchen

## 2016-07-29 ENCOUNTER — Other Ambulatory Visit: Payer: Self-pay | Admitting: *Deleted

## 2016-07-29 MED ORDER — AMLODIPINE BESYLATE 5 MG PO TABS: 5.0000 mg | ORAL_TABLET | Freq: Every day | ORAL | 6 refills | Status: DC

## 2016-07-30 NOTE — Progress Notes (Deleted)
Cardiology Office Note   Date:  07/30/2016   ID:  Valerie [name], DOB 1927-12-02, MRN [id number]  PCP:  Claretta Fraise, MD  Cardiologist:  Dr. Acie Fredrickson    No chief complaint on file.     History of Present Illness: Valerie [name] is a 82 y.o. female who presents for HR follow up and palpitations.  She was to have Echo and holter monitor before follow up. ***    Past Medical History:  Diagnosis Date  . Anxiety   . Arthritis   . Carotid artery disease ([place])   . Cataract   . Depression   . Diverticulosis of colon with hemorrhage   . Duodenitis   . Esophageal reflux   . Esophageal stricture   . Essential hypertension   . Glaucoma   . Hiatal hernia   . Hyperlipidemia   . IBS (irritable bowel syndrome)   . Palpitations   . UTI (urinary tract infection) March 2015    Past Surgical History:  Procedure Laterality Date  . CHOLECYSTECTOMY    . DILATION AND CURETTAGE OF UTERUS    . LEFT ROTATOR CUFF REPAIR X2    . RIGHT ROTATOR CUFF REPAIR X1       Current Outpatient Prescriptions  Medication Sig Dispense Refill  . amLODipine (NORVASC) 5 MG tablet Take 1 tablet (5 mg total) by mouth daily. 30 tablet 6  . aspirin 81 MG tablet Take 81 mg by mouth daily.      Marland Kitchen esomeprazole (NEXIUM) 40 MG capsule Take 1 capsule (40 mg total) by mouth daily as needed (acid reflux).    . hydrochlorothiazide (HYDRODIURIL) 25 MG tablet Take 1 tablet (25 mg total) by mouth daily. 30 tablet 5  . metoprolol (LOPRESSOR) 50 MG tablet Take 1 tablet (50 mg total) by mouth 2 (two) times daily. 180 tablet 2  . nitroGLYCERIN (NITROSTAT) 0.4 MG SL tablet Place 1 tablet (0.4 mg total) under the tongue every 5 (five) minutes as needed for chest pain. 25 tablet 3  . pravastatin (PRAVACHOL) 10 MG tablet Take 1 tablet (10 mg total) by mouth daily. 90 tablet 2  . Vitamin D, Ergocalciferol, (DRISDOL) 50000 units CAPS capsule Take 1 capsule (50,000 Units total) by mouth 2 (two) times a week. 16 capsule 0   Current  Facility-Administered Medications  Medication Dose Route Frequency Provider Last Rate Last Dose  . cyanocobalamin ((VITAMIN B-12)) injection 1,000 mcg  1,000 mcg Intramuscular Q30 days Claretta Fraise, MD   1,000 mcg at 05/25/16 1200    Allergies:   Lisinopril; Sulfa antibiotics; and Aricept [donepezil hcl]    Social History:  The patient  reports that she has never smoked. She has never used smokeless tobacco. She reports that she does not drink alcohol or use drugs.   Family History:  The patient's ***family history includes Bladder Cancer in her brother; Colon cancer (age of onset: 75) in her father; Kidney cancer in her brother; Leukemia in her mother; Stroke in her sister.    ROS:  General:no colds or fevers, no weight changes Skin:no rashes or ulcers HEENT:no blurred vision, no congestion CV:see HPI PUL:see HPI GI:no diarrhea constipation or melena, no indigestion GU:no hematuria, no dysuria MS:no joint pain, no claudication Neuro:no syncope, no lightheadedness Endo:no diabetes, no thyroid disease Wt Readings from Last 3 Encounters:  07/10/16 114 lb (51.7 kg)  06/29/16 120 lb (54.4 kg)  05/25/16 119 lb (54 kg)     PHYSICAL EXAM: VS:  There were no vitals  taken for this visit. , BMI There is no height or weight on file to calculate BMI. General:Pleasant affect, NAD Skin:Warm and dry, brisk capillary refill HEENT:normocephalic, sclera clear, mucus membranes moist Neck:supple, no JVD, no bruits  Heart:S1S2 RRR without murmur, gallup, rub or click Lungs:clear without rales, rhonchi, or wheezes [id number], non tender, + BS, do not palpate liver spleen or masses Ext:no lower ext edema, 2+ pedal pulses, 2+ radial pulses Neuro:alert and oriented, MAE, follows commands, + facial symmetry    EKG:  EKG is ordered today. The ekg ordered today demonstrates ***   Recent Labs: 06/29/2016: ALT 13; Hemoglobin 14.2; Platelets 356 07/10/2016: BUN 21; Creat 0.80; Potassium 4.0; Sodium  139    Lipid Panel    Component Value Date/Time   CHOL 194 05/25/2016 1201   TRIG 137 05/25/2016 1201   HDL 54 05/25/2016 1201   CHOLHDL 3.6 05/25/2016 1201   CHOLHDL 4 12/05/2014 0946   VLDL 26.0 12/05/2014 0946   LDLCALC 113 (H) 05/25/2016 1201       Other studies Reviewed: Additional studies/ records that were reviewed today include: ***.   ASSESSMENT AND PLAN:  1.  ***   Current medicines are reviewed with the patient today.  The patient Has no concerns regarding medicines.  The following changes have been made:  See above Labs/ tests ordered today include:see above  Disposition:   FU:  see above  Signed, Cecilie Kicks, NP  07/30/2016 9:21 PM    [facility], [place], [place], [place] Phone: [phone]; Fax: [phone]

## 2016-07-31 ENCOUNTER — Other Ambulatory Visit: Payer: Self-pay

## 2016-07-31 ENCOUNTER — Ambulatory Visit (INDEPENDENT_AMBULATORY_CARE_PROVIDER_SITE_OTHER): Payer: Medicare Other

## 2016-07-31 ENCOUNTER — Ambulatory Visit (INDEPENDENT_AMBULATORY_CARE_PROVIDER_SITE_OTHER): Payer: Medicare Other | Admitting: Cardiology

## 2016-07-31 ENCOUNTER — Ambulatory Visit (HOSPITAL_COMMUNITY): Payer: Medicare Other | Attending: Cardiovascular Disease

## 2016-07-31 ENCOUNTER — Other Ambulatory Visit: Payer: Self-pay | Admitting: Family Medicine

## 2016-07-31 DIAGNOSIS — R0602 Shortness of breath: Secondary | ICD-10-CM | POA: Diagnosis not present

## 2016-07-31 DIAGNOSIS — R001 Bradycardia, unspecified: Secondary | ICD-10-CM

## 2016-07-31 DIAGNOSIS — R42 Dizziness and giddiness: Secondary | ICD-10-CM

## 2016-07-31 DIAGNOSIS — I10 Essential (primary) hypertension: Secondary | ICD-10-CM

## 2016-08-03 ENCOUNTER — Telehealth: Payer: Self-pay | Admitting: Cardiology

## 2016-08-03 NOTE — Telephone Encounter (Signed)
New message  Pt returning call, did not get message from 11/3  Please call back

## 2016-08-03 NOTE — Telephone Encounter (Signed)
ECHO results. Line busy.

## 2016-08-04 ENCOUNTER — Encounter: Payer: Self-pay | Admitting: Pediatrics

## 2016-08-04 NOTE — Telephone Encounter (Signed)
I called patient back. I reviewed her ECHO results and inquired about her BP since starting amlodipine.  She reports BPs have been better:  10/19  154/64 AM before medication 10/19  145/56 PM after medication  10/25  134/61 AM before medication  10/26  100/32  PM after medication  I advised patient to take BPs a couple of hours after taking medications in the morning.  She voiced understanding and agreed with plan.

## 2016-08-24 NOTE — Progress Notes (Signed)
Cardiology Office Note   Date:  08/25/2016   ID:  Valerie [name], DOB 09/28/1928, MRN [id number]  PCP:  Valerie [name], Valerie [name]  Cardiologist:  Dr. Acie Fredrickson    Chief Complaint  Patient presents with  . Dizziness    bradycardia     Please note pt was followed by Dr. Mare Ferrari and with his retirement was seen once by Dr. Domenic Polite in [place], but her daughter who is followed by Dr. Acie Fredrickson would like her seen by Dr. Acie Fredrickson.  He has agreed to see pt.  History of Present Illness: Valerie [name] is a 82 y.o. female who presents for HTN, bradycardia and dizziness.  PMH of HTN, HLD, anxiety, depression, GERD, CAD, glaucoma-underwent intervention approximately 3 months ago, lives alone but has family living next door who provide close supervision, presented to [facility] ED on 06/30/16 with complaints of ongoing blurred vision for several weeks post procedure,? Intermittent dyspnea and arm pain. Along with chest pain that she states is arthritic.  She checks her blood pressures regularly at home and apparently were elevated in the 220/95 range despite compliance with medications. She apparently has not felt well for a couple of days, headache, poor oral intake and visual changes since surgery for which she has recently been seen by her ophthalmologist and has follow-up in 2 months. In the ED, CT head without acute abnormalities, initial blood pressure 220/77. Admitted for hypertensive urgency.  Her troponin was negative.  Hx of neg nuc study in 2015.    She had HR to 46 (though looking back she has been slow before)..  Meds adjusted and plan for outpt echo.  She had complained about SOB but was not clear today she does have at times.   Was with hypokalemia and follow up was with K+ 3.3.    Hx of carotid disease with last check in 09/2015 with 1-39% bil stenosis. To be seen by vascular in Dec.   On her last visit she complained of dizziness.  Her HR was low so I stopped cardizem and added amlodipine.  Also on 48 hour  holter she was in SR 56 to 60 with occ episodes of sinus brady brief to 42 and occ PACs and PVCs.  Her echo with EF of 55 to 60% G2DD, mild Aortic regurg. LA mildly dialted. PA pk pressure 37 mmHg.   Today she feels better with less palpitations and less dizziness.  She does complain of hair loss.  This began with amlodipine.  She had hair loss with lisinopril as well.  No chest pain and no SOB.       Past Medical History:  Diagnosis Date  . Anxiety   . Arthritis   . Carotid artery disease ([place])   . Cataract   . Depression   . Diverticulosis of colon with hemorrhage   . Duodenitis   . Esophageal reflux   . Esophageal stricture   . Essential hypertension   . Glaucoma   . Hiatal hernia   . Hyperlipidemia   . IBS (irritable bowel syndrome)   . Palpitations   . UTI (urinary tract infection) March 2015    Past Surgical History:  Procedure Laterality Date  . CHOLECYSTECTOMY    . DILATION AND CURETTAGE OF UTERUS    . LEFT ROTATOR CUFF REPAIR X2    . RIGHT ROTATOR CUFF REPAIR X1       Current Outpatient Prescriptions  Medication Sig Dispense Refill  . amLODipine (NORVASC) 5 MG tablet  Take 1 tablet (5 mg total) by mouth daily. 30 tablet 6  . aspirin 81 MG tablet Take 81 mg by mouth daily.      Marland Kitchen esomeprazole (NEXIUM) 40 MG capsule Take 1 capsule (40 mg total) by mouth daily as needed (acid reflux).    . hydrochlorothiazide (HYDRODIURIL) 25 MG tablet Take 1 tablet (25 mg total) by mouth daily. 30 tablet 5  . metoprolol (LOPRESSOR) 50 MG tablet Take 1 tablet (50 mg total) by mouth 2 (two) times daily. 180 tablet 2  . nitroGLYCERIN (NITROSTAT) 0.4 MG SL tablet Place 1 tablet (0.4 mg total) under the tongue every 5 (five) minutes as needed for chest pain. 25 tablet 3  . pravastatin (PRAVACHOL) 10 MG tablet Take 1 tablet (10 mg total) by mouth daily. 90 tablet 2  . Vitamin D, Ergocalciferol, (DRISDOL) 50000 units CAPS capsule Take 1 Capsule by mouth 2 times a week 8 capsule 1    Current Facility-Administered Medications  Medication Dose Route Frequency Provider Last Rate Last Dose  . cyanocobalamin ((VITAMIN B-12)) injection 1,000 mcg  1,000 mcg Intramuscular Q30 days Valerie [name], Valerie [name]   1,000 mcg at 05/25/16 1200    Allergies:   Lisinopril; Sulfa antibiotics; and Aricept [donepezil hcl]    Social History:  The patient  reports that she has never smoked. She has never used smokeless tobacco. She reports that she does not drink alcohol or use drugs.   Family History:  The patient's family history includes Bladder Cancer in her brother; Colon cancer (age of onset: 74) in her father; Kidney cancer in her brother; Leukemia in her mother; Stroke in her sister.    ROS:  General:no colds or fevers, + weight changes Skin:no rashes or ulcers HEENT:no blurred vision, no congestion, + hairloss CV:see HPI PUL:see HPI GI:no diarrhea constipation or melena, no indigestion GU:no hematuria, no dysuria MS:no joint pain, no claudication Neuro:no syncope, occ lightheadedness but improved Endo:no diabetes, no thyroid disease  Wt Readings from Last 3 Encounters:  08/25/16 115 lb (52.2 kg)  07/10/16 114 lb (51.7 kg)  06/29/16 120 lb (54.4 kg)     PHYSICAL EXAM: VS:  BP 118/62   Pulse (!) 50   Ht 5\' 4"  (1.626 m)   Wt 115 lb (52.2 kg)   BMI 19.74 kg/m  , BMI Body mass index is 19.74 kg/m. General:Pleasant affect, NAD Skin:Warm and dry, brisk capillary refill HEENT:normocephalic, sclera clear, mucus membranes moist Heart:S1S2 RRR with soft systolic murmur, no gallup, rub or click Lungs:clear without rales, rhonchi, or wheezes [id number], non tender, + BS, do not palpate liver spleen or masses Ext:no lower ext edema, 2+ pedal pulses, 2+ radial pulses Neuro:alert and oriented, MAE, follows commands, + facial symmetry    EKG:  EKG is ordered today. The ekg ordered today demonstrates sinus Brady at 50 no ST changes.     Recent Labs: 06/29/2016: ALT 13; Hemoglobin  14.2; Platelets 356 07/10/2016: BUN 21; Creat 0.80; Potassium 4.0; Sodium 139    Lipid Panel    Component Value Date/Time   CHOL 194 05/25/2016 1201   TRIG 137 05/25/2016 1201   HDL 54 05/25/2016 1201   CHOLHDL 3.6 05/25/2016 1201   CHOLHDL 4 12/05/2014 0946   VLDL 26.0 12/05/2014 0946   LDLCALC 113 (H) 05/25/2016 1201       Other studies Reviewed: Additional studies/ records that were reviewed today include:  Echo 07/31/16 Study Conclusions  - Left ventricle: The cavity size was normal. There was mild  focal   basal hypertrophy of the septum. Systolic function was normal.   The estimated ejection fraction was in the range of 55% to 60%.   Wall motion was normal; there were no regional wall motion   abnormalities. Features are consistent with a pseudonormal left   ventricular filling pattern, with concomitant abnormal relaxation   and increased filling pressure (grade 2 diastolic dysfunction). - Aortic valve: There was mild regurgitation. - Mitral valve: Calcified annulus. There was mild regurgitation. - Left atrium: The atrium was mildly dilated. - Pulmonary arteries: Systolic pressure was mildly increased. PA   peak pressure: 37 mm Hg (S).   ASSESSMENT AND PLAN:  1.  HTN still elevated at home but with low HR I stopped cardizem and added amlodipine 5 mg.  Now with hairloss,  Will try losartan, with BP low today will start at 25 mg and she will call in BPs next week.   2. Chronic hx of palpatitations  And bradycardia - stopping dilt and 48 hour holter revealing HR to 48 on rare occ.  Mostly 58- 60.  She has tolerated in the past but at her age this may be too slow. She has less palpitations. Is feeling well.    3. Hx of cardiac murmur, previous echo 2009.  Echo 07/31/16 with EF 55-60% G2DD, mild AR, PA pressure elevated at 37 mmHg.   4. Carotid disease followed by vascular.    Current medicines are reviewed with the patient today.  The patient Has no concerns  regarding medicines.  The following changes have been made:  See above Labs/ tests ordered today include:see above  Disposition:   FU:  see above  Signed, Cecilie Kicks, NP  08/25/2016 8:40 AM    [facility], [place], [place], [place] Phone: [phone]; Fax: [phone]

## 2016-08-25 ENCOUNTER — Encounter: Payer: Self-pay | Admitting: Cardiology

## 2016-08-25 ENCOUNTER — Ambulatory Visit (INDEPENDENT_AMBULATORY_CARE_PROVIDER_SITE_OTHER): Payer: Medicare Other | Admitting: Cardiology

## 2016-08-25 VITALS — BP 118/62 | HR 50 | Ht 64.0 in | Wt 115.0 lb

## 2016-08-25 DIAGNOSIS — I160 Hypertensive urgency: Secondary | ICD-10-CM | POA: Diagnosis not present

## 2016-08-25 DIAGNOSIS — I10 Essential (primary) hypertension: Secondary | ICD-10-CM | POA: Diagnosis not present

## 2016-08-25 DIAGNOSIS — I119 Hypertensive heart disease without heart failure: Secondary | ICD-10-CM

## 2016-08-25 DIAGNOSIS — R002 Palpitations: Secondary | ICD-10-CM

## 2016-08-25 DIAGNOSIS — R001 Bradycardia, unspecified: Secondary | ICD-10-CM

## 2016-08-25 DIAGNOSIS — R42 Dizziness and giddiness: Secondary | ICD-10-CM

## 2016-08-25 MED ORDER — LOSARTAN POTASSIUM 25 MG PO TABS: 25.0000 mg | ORAL_TABLET | Freq: Every day | ORAL | 6 refills | Status: DC

## 2016-08-25 NOTE — Patient Instructions (Addendum)
Stop Amlodipine  Start Losartan 25 mg daily  Monitor Blood Pressure Call office next week with readings   Your physician recommends that you schedule a follow-up appointment with Dr.Nahser Thursday 11/26/16 at 10:00 am

## 2016-08-28 ENCOUNTER — Other Ambulatory Visit: Payer: Self-pay | Admitting: Family Medicine

## 2016-09-30 ENCOUNTER — Encounter (INDEPENDENT_AMBULATORY_CARE_PROVIDER_SITE_OTHER): Payer: Medicare Other | Admitting: Ophthalmology

## 2016-09-30 DIAGNOSIS — H44751 Retained (nonmagnetic) (old) foreign body in vitreous body, right eye: Secondary | ICD-10-CM

## 2016-09-30 DIAGNOSIS — I10 Essential (primary) hypertension: Secondary | ICD-10-CM

## 2016-09-30 DIAGNOSIS — H59033 Cystoid macular edema following cataract surgery, bilateral: Secondary | ICD-10-CM | POA: Diagnosis not present

## 2016-09-30 DIAGNOSIS — H35033 Hypertensive retinopathy, bilateral: Secondary | ICD-10-CM

## 2016-09-30 DIAGNOSIS — D3132 Benign neoplasm of left choroid: Secondary | ICD-10-CM

## 2016-09-30 DIAGNOSIS — H43813 Vitreous degeneration, bilateral: Secondary | ICD-10-CM

## 2016-10-01 ENCOUNTER — Telehealth: Payer: Self-pay | Admitting: Cardiovascular Disease

## 2016-10-01 NOTE — Telephone Encounter (Signed)
Walk In pt Form-[facility] dropped off. Gave to [place] to take around.

## 2016-10-19 ENCOUNTER — Encounter (HOSPITAL_COMMUNITY): Payer: Self-pay | Admitting: *Deleted

## 2016-10-19 NOTE — Progress Notes (Signed)
Valerie [name] denies chest pain or shortness of breath.  Valerie [name] states that her sinus are bothering her and it makes her dizzy sometimes.  Valerie [name] keeps a record of blood pressure, "one day it was 104/45- but most of the time it is higher/"

## 2016-10-20 ENCOUNTER — Ambulatory Visit (HOSPITAL_COMMUNITY)
Admission: RE | Admit: 2016-10-20 | Discharge: 2016-10-21 | Disposition: A | Discharge status: Home / Self Care | Payer: Medicare Other | Source: Ambulatory Visit | Attending: Ophthalmology | Admitting: Ophthalmology

## 2016-10-20 ENCOUNTER — Ambulatory Visit (HOSPITAL_COMMUNITY): Payer: Medicare Other | Admitting: Certified Registered Nurse Anesthetist

## 2016-10-20 ENCOUNTER — Encounter (HOSPITAL_COMMUNITY): Payer: Self-pay | Admitting: *Deleted

## 2016-10-20 ENCOUNTER — Encounter (HOSPITAL_COMMUNITY)
Admission: RE | Disposition: A | Discharge status: Home / Self Care | Payer: Self-pay | Source: Ambulatory Visit | Attending: Ophthalmology

## 2016-10-20 DIAGNOSIS — I10 Essential (primary) hypertension: Secondary | ICD-10-CM | POA: Insufficient documentation

## 2016-10-20 DIAGNOSIS — E785 Hyperlipidemia, unspecified: Secondary | ICD-10-CM | POA: Insufficient documentation

## 2016-10-20 DIAGNOSIS — H409 Unspecified glaucoma: Secondary | ICD-10-CM | POA: Diagnosis not present

## 2016-10-20 DIAGNOSIS — H27131 Posterior dislocation of lens, right eye: Secondary | ICD-10-CM

## 2016-10-20 DIAGNOSIS — Z7982 Long term (current) use of aspirin: Secondary | ICD-10-CM | POA: Insufficient documentation

## 2016-10-20 DIAGNOSIS — I739 Peripheral vascular disease, unspecified: Secondary | ICD-10-CM | POA: Insufficient documentation

## 2016-10-20 DIAGNOSIS — T8522XA Displacement of intraocular lens, initial encounter: Secondary | ICD-10-CM | POA: Diagnosis not present

## 2016-10-20 DIAGNOSIS — Z79899 Other long term (current) drug therapy: Secondary | ICD-10-CM | POA: Insufficient documentation

## 2016-10-20 DIAGNOSIS — Y831 Surgical operation with implant of artificial internal device as the cause of abnormal reaction of the patient, or of later complication, without mention of misadventure at the time of the procedure: Secondary | ICD-10-CM | POA: Insufficient documentation

## 2016-10-20 HISTORY — PX: PARS PLANA VITRECTOMY: SHX2166

## 2016-10-20 HISTORY — PX: LASER PHOTO ABLATION: SHX5942

## 2016-10-20 HISTORY — DX: Personal history of urinary calculi: Z87.442

## 2016-10-20 LAB — BASIC METABOLIC PANEL
Anion gap: 9 (ref 5–15)
BUN: 23 mg/dL — ABNORMAL HIGH (ref 6–20)
CALCIUM: 10.2 mg/dL (ref 8.9–10.3)
CHLORIDE: 105 mmol/L (ref 101–111)
CO2: 27 mmol/L (ref 22–32)
CREATININE: 0.81 mg/dL (ref 0.44–1.00)
Glucose, Bld: 79 mg/dL (ref 65–99)
Potassium: 3.2 mmol/L — ABNORMAL LOW (ref 3.5–5.1)
SODIUM: 141 mmol/L (ref 135–145)

## 2016-10-20 LAB — CBC
HCT: 44.1 % (ref 36.0–46.0)
Hemoglobin: 15.2 g/dL — ABNORMAL HIGH (ref 12.0–15.0)
MCH: 33 pg (ref 26.0–34.0)
MCHC: 34.5 g/dL (ref 30.0–36.0)
MCV: 95.7 fL (ref 78.0–100.0)
PLATELETS: 305 10*3/uL (ref 150–400)
RBC: 4.61 MIL/uL (ref 3.87–5.11)
RDW: 13 % (ref 11.5–15.5)
WBC: 7 10*3/uL (ref 4.0–10.5)

## 2016-10-20 SURGERY — PARS PLANA VITRECTOMY WITH 25G REMOVAL/SUTURE INTRAOCULAR LENS
Anesthesia: General | Site: Eye | Laterality: Right

## 2016-10-20 MED ORDER — EPHEDRINE SULFATE 50 MG/ML IJ SOLN
INTRAMUSCULAR | Status: DC | PRN
  Administered 2016-10-20 (×2): 5 mg via INTRAVENOUS

## 2016-10-20 MED ORDER — HYALURONIDASE HUMAN 150 UNIT/ML IJ SOLN
INTRAMUSCULAR | Status: AC
  Filled 2016-10-20: qty 1

## 2016-10-20 MED ORDER — GATIFLOXACIN 0.5 % OP SOLN
1.0000 [drp] | OPHTHALMIC | Status: AC | PRN
  Administered 2016-10-20 (×3): 1 [drp] via OPHTHALMIC

## 2016-10-20 MED ORDER — EPINEPHRINE PF 1 MG/ML IJ SOLN
INTRAMUSCULAR | Status: DC | PRN
  Administered 2016-10-20: 500 mL

## 2016-10-20 MED ORDER — ALBUTEROL SULFATE HFA 108 (90 BASE) MCG/ACT IN AERS
INHALATION_SPRAY | RESPIRATORY_TRACT | Status: AC
  Filled 2016-10-20: qty 6.7

## 2016-10-20 MED ORDER — BUPIVACAINE HCL (PF) 0.75 % IJ SOLN
INTRAMUSCULAR | Status: DC | PRN
  Administered 2016-10-20: 10 mL

## 2016-10-20 MED ORDER — FENTANYL CITRATE (PF) 100 MCG/2ML IJ SOLN
INTRAMUSCULAR | Status: AC
  Administered 2016-10-20: 25 ug via INTRAVENOUS
  Filled 2016-10-20: qty 2

## 2016-10-20 MED ORDER — PHENYLEPHRINE HCL 2.5 % OP SOLN
1.0000 [drp] | OPHTHALMIC | Status: AC | PRN
  Administered 2016-10-20 (×3): 1 [drp] via OPHTHALMIC

## 2016-10-20 MED ORDER — HEMOSTATIC AGENTS (NO CHARGE) OPTIME
TOPICAL | Status: DC | PRN
  Administered 2016-10-20: 1

## 2016-10-20 MED ORDER — FENTANYL CITRATE (PF) 100 MCG/2ML IJ SOLN
25.0000 ug | INTRAMUSCULAR | Status: DC | PRN
  Administered 2016-10-20 (×2): 25 ug via INTRAVENOUS

## 2016-10-20 MED ORDER — LIDOCAINE HCL 2 % IJ SOLN
INTRAMUSCULAR | Status: AC
  Filled 2016-10-20: qty 20

## 2016-10-20 MED ORDER — PRAVASTATIN SODIUM 10 MG PO TABS
10.0000 mg | ORAL_TABLET | Freq: Every day | ORAL | Status: DC
Start: 2016-10-20 — End: 2016-10-21
  Administered 2016-10-20: 10 mg via ORAL
  Filled 2016-10-20: qty 1

## 2016-10-20 MED ORDER — KETOROLAC TROMETHAMINE 30 MG/ML IJ SOLN
INTRAMUSCULAR | Status: AC
  Filled 2016-10-20: qty 2

## 2016-10-20 MED ORDER — DORZOLAMIDE HCL 2 % OP SOLN: 1.0000 [drp] | Freq: Three times a day (TID) | OPHTHALMIC | Status: DC

## 2016-10-20 MED ORDER — BSS PLUS IO SOLN
INTRAOCULAR | Status: AC
  Filled 2016-10-20: qty 500

## 2016-10-20 MED ORDER — BACITRACIN-POLYMYXIN B 500-10000 UNIT/GM OP OINT
TOPICAL_OINTMENT | OPHTHALMIC | Status: AC
  Filled 2016-10-20: qty 3.5

## 2016-10-20 MED ORDER — TEMAZEPAM 15 MG PO CAPS: 15.0000 mg | ORAL_CAPSULE | Freq: Every evening | ORAL | Status: DC | PRN

## 2016-10-20 MED ORDER — TRIAMCINOLONE ACETONIDE 40 MG/ML IJ SUSP
INTRAMUSCULAR | Status: AC
  Filled 2016-10-20: qty 5

## 2016-10-20 MED ORDER — PROPOFOL 10 MG/ML IV BOLUS
INTRAVENOUS | Status: AC
  Filled 2016-10-20: qty 20

## 2016-10-20 MED ORDER — CYCLOPENTOLATE HCL 1 % OP SOLN
1.0000 [drp] | OPHTHALMIC | Status: AC | PRN
  Administered 2016-10-20 (×3): 1 [drp] via OPHTHALMIC

## 2016-10-20 MED ORDER — ONDANSETRON HCL 4 MG/2ML IJ SOLN
INTRAMUSCULAR | Status: AC
  Filled 2016-10-20: qty 2

## 2016-10-20 MED ORDER — ACETAZOLAMIDE SODIUM 500 MG IJ SOLR
INTRAMUSCULAR | Status: AC
  Filled 2016-10-20: qty 500

## 2016-10-20 MED ORDER — SODIUM HYALURONATE 10 MG/ML IO SOLN
INTRAOCULAR | Status: DC | PRN
  Administered 2016-10-20: 0.85 mL via INTRAOCULAR

## 2016-10-20 MED ORDER — BACITRACIN-POLYMYXIN B 500-10000 UNIT/GM OP OINT
TOPICAL_OINTMENT | OPHTHALMIC | Status: DC | PRN
  Administered 2016-10-20: 1 via OPHTHALMIC

## 2016-10-20 MED ORDER — ONDANSETRON HCL 4 MG/2ML IJ SOLN
INTRAMUSCULAR | Status: DC | PRN
  Administered 2016-10-20: 4 mg via INTRAVENOUS

## 2016-10-20 MED ORDER — SODIUM CHLORIDE 0.9 % IV SOLN
INTRAVENOUS | Status: DC
Start: 2016-10-20 — End: 2016-10-20
  Administered 2016-10-20 (×3): via INTRAVENOUS

## 2016-10-20 MED ORDER — ENSURE ENLIVE PO LIQD: 237.0000 mL | Freq: Two times a day (BID) | ORAL | Status: DC

## 2016-10-20 MED ORDER — STERILE WATER FOR IRRIGATION IR SOLN
Status: DC | PRN
  Administered 2016-10-20: 1000 mL

## 2016-10-20 MED ORDER — GATIFLOXACIN 0.5 % OP SOLN
OPHTHALMIC | Status: AC
  Administered 2016-10-20: 1 [drp]
  Filled 2016-10-20: qty 2.5

## 2016-10-20 MED ORDER — POLYMYXIN B SULFATE 500000 UNITS IJ SOLR
INTRAMUSCULAR | Status: AC
  Filled 2016-10-20: qty 500000

## 2016-10-20 MED ORDER — BSS IO SOLN
INTRAOCULAR | Status: AC
  Filled 2016-10-20: qty 15

## 2016-10-20 MED ORDER — ROCURONIUM BROMIDE 100 MG/10ML IV SOLN
INTRAVENOUS | Status: DC | PRN
  Administered 2016-10-20: 30 mg via INTRAVENOUS

## 2016-10-20 MED ORDER — PROPOFOL 10 MG/ML IV BOLUS
INTRAVENOUS | Status: DC | PRN
  Administered 2016-10-20: 80 mg via INTRAVENOUS

## 2016-10-20 MED ORDER — BRIMONIDINE TARTRATE 0.2 % OP SOLN
1.0000 [drp] | Freq: Two times a day (BID) | OPHTHALMIC | Status: DC
  Filled 2016-10-20: qty 5

## 2016-10-20 MED ORDER — SODIUM CHLORIDE 0.9 % IJ SOLN
INTRAMUSCULAR | Status: AC
  Filled 2016-10-20: qty 10

## 2016-10-20 MED ORDER — ACETAMINOPHEN 325 MG PO TABS: 325.0000 mg | ORAL_TABLET | ORAL | Status: DC | PRN

## 2016-10-20 MED ORDER — PREDNISOLONE ACETATE 1 % OP SUSP
1.0000 [drp] | Freq: Four times a day (QID) | OPHTHALMIC | Status: DC
  Filled 2016-10-20: qty 5

## 2016-10-20 MED ORDER — SODIUM CHLORIDE 0.45 % IV SOLN
INTRAVENOUS | Status: DC
  Administered 2016-10-20: 22:00:00 via INTRAVENOUS

## 2016-10-20 MED ORDER — BRINZOLAMIDE 1 % OP SUSP
1.0000 [drp] | Freq: Three times a day (TID) | OPHTHALMIC | Status: DC
  Filled 2016-10-20: qty 10

## 2016-10-20 MED ORDER — LATANOPROST 0.005 % OP SOLN
1.0000 [drp] | Freq: Every day | OPHTHALMIC | Status: DC
  Filled 2016-10-20: qty 2.5

## 2016-10-20 MED ORDER — METOCLOPRAMIDE HCL 5 MG/ML IJ SOLN: 10.0000 mg | Freq: Once | INTRAMUSCULAR | Status: DC | PRN

## 2016-10-20 MED ORDER — DIFLUPREDNATE 0.05 % OP EMUL: 1.0000 [drp] | Freq: Two times a day (BID) | OPHTHALMIC | Status: DC

## 2016-10-20 MED ORDER — TROPICAMIDE 1 % OP SOLN
OPHTHALMIC | Status: AC
  Administered 2016-10-20: 1 [drp]
  Filled 2016-10-20: qty 15

## 2016-10-20 MED ORDER — CYCLOPENTOLATE HCL 1 % OP SOLN
OPHTHALMIC | Status: AC
  Administered 2016-10-20: 1 [drp]
  Filled 2016-10-20: qty 2

## 2016-10-20 MED ORDER — EPINEPHRINE PF 1 MG/ML IJ SOLN
INTRAMUSCULAR | Status: AC
  Filled 2016-10-20: qty 1

## 2016-10-20 MED ORDER — LOSARTAN POTASSIUM 50 MG PO TABS
25.0000 mg | ORAL_TABLET | Freq: Every day | ORAL | Status: DC
  Filled 2016-10-20: qty 1

## 2016-10-20 MED ORDER — FENTANYL CITRATE (PF) 100 MCG/2ML IJ SOLN
INTRAMUSCULAR | Status: AC
  Filled 2016-10-20: qty 2

## 2016-10-20 MED ORDER — MORPHINE SULFATE (PF) 2 MG/ML IV SOLN: 1.0000 mg | INTRAVENOUS | Status: DC | PRN

## 2016-10-20 MED ORDER — TROPICAMIDE 1 % OP SOLN
1.0000 [drp] | OPHTHALMIC | Status: AC | PRN
  Administered 2016-10-20 (×3): 1 [drp] via OPHTHALMIC

## 2016-10-20 MED ORDER — CEFAZOLIN SODIUM-DEXTROSE 2-4 GM/100ML-% IV SOLN
INTRAVENOUS | Status: AC
  Filled 2016-10-20: qty 100

## 2016-10-20 MED ORDER — ESMOLOL HCL 100 MG/10ML IV SOLN
INTRAVENOUS | Status: DC | PRN
  Administered 2016-10-20: 10 mg via INTRAVENOUS

## 2016-10-20 MED ORDER — PHENYLEPHRINE HCL 10 MG/ML IJ SOLN
INTRAMUSCULAR | Status: DC | PRN
  Administered 2016-10-20: 80 ug via INTRAVENOUS

## 2016-10-20 MED ORDER — MAGNESIUM HYDROXIDE 400 MG/5ML PO SUSP: 15.0000 mL | Freq: Four times a day (QID) | ORAL | Status: DC | PRN

## 2016-10-20 MED ORDER — ACETAZOLAMIDE SODIUM 500 MG IJ SOLR
500.0000 mg | Freq: Once | INTRAMUSCULAR | Status: AC
  Administered 2016-10-21: 500 mg via INTRAVENOUS
  Filled 2016-10-20: qty 500

## 2016-10-20 MED ORDER — NEPAFENAC 0.3 % OP SUSP: 1.0000 [drp] | Freq: Every day | OPHTHALMIC | Status: DC

## 2016-10-20 MED ORDER — GATIFLOXACIN 0.5 % OP SOLN
1.0000 [drp] | Freq: Four times a day (QID) | OPHTHALMIC | Status: DC
  Filled 2016-10-20: qty 2.5

## 2016-10-20 MED ORDER — DEXAMETHASONE SODIUM PHOSPHATE 10 MG/ML IJ SOLN
INTRAMUSCULAR | Status: AC
  Filled 2016-10-20: qty 1

## 2016-10-20 MED ORDER — LIDOCAINE 2% (20 MG/ML) 5 ML SYRINGE
INTRAMUSCULAR | Status: AC
  Filled 2016-10-20: qty 10

## 2016-10-20 MED ORDER — BUPIVACAINE HCL (PF) 0.75 % IJ SOLN
INTRAMUSCULAR | Status: AC
  Filled 2016-10-20: qty 10

## 2016-10-20 MED ORDER — DEXAMETHASONE SODIUM PHOSPHATE 10 MG/ML IJ SOLN
INTRAMUSCULAR | Status: DC | PRN
Start: 2016-10-20 — End: 2016-10-20
  Administered 2016-10-20: 10 mg

## 2016-10-20 MED ORDER — SODIUM HYALURONATE 10 MG/ML IO SOLN
INTRAOCULAR | Status: AC
  Filled 2016-10-20: qty 0.85

## 2016-10-20 MED ORDER — SUGAMMADEX SODIUM 200 MG/2ML IV SOLN
INTRAVENOUS | Status: AC
  Filled 2016-10-20: qty 4

## 2016-10-20 MED ORDER — ATROPINE SULFATE 1 % OP SOLN
OPHTHALMIC | Status: AC
  Filled 2016-10-20: qty 5

## 2016-10-20 MED ORDER — BACITRACIN-POLYMYXIN B 500-10000 UNIT/GM OP OINT
1.0000 "application " | TOPICAL_OINTMENT | Freq: Three times a day (TID) | OPHTHALMIC | Status: DC
  Filled 2016-10-20: qty 3.5

## 2016-10-20 MED ORDER — FENTANYL CITRATE (PF) 100 MCG/2ML IJ SOLN
INTRAMUSCULAR | Status: DC | PRN
  Administered 2016-10-20: 100 ug via INTRAVENOUS

## 2016-10-20 MED ORDER — CEFTAZIDIME 1 G IJ SOLR
INTRAMUSCULAR | Status: AC
  Filled 2016-10-20: qty 1

## 2016-10-20 MED ORDER — ASPIRIN EC 81 MG PO TBEC: 81.0000 mg | DELAYED_RELEASE_TABLET | Freq: Every day | ORAL | Status: DC

## 2016-10-20 MED ORDER — HYPROMELLOSE (GONIOSCOPIC) 2.5 % OP SOLN
OPHTHALMIC | Status: AC
  Filled 2016-10-20: qty 15

## 2016-10-20 MED ORDER — ROCURONIUM BROMIDE 50 MG/5ML IV SOSY
PREFILLED_SYRINGE | INTRAVENOUS | Status: AC
  Filled 2016-10-20: qty 20

## 2016-10-20 MED ORDER — PHENYLEPHRINE HCL 2.5 % OP SOLN
OPHTHALMIC | Status: AC
  Administered 2016-10-20: 1 [drp]
  Filled 2016-10-20: qty 2

## 2016-10-20 MED ORDER — 0.9 % SODIUM CHLORIDE (POUR BTL) OPTIME
TOPICAL | Status: DC | PRN
  Administered 2016-10-20: 1000 mL

## 2016-10-20 MED ORDER — SUGAMMADEX SODIUM 200 MG/2ML IV SOLN
INTRAVENOUS | Status: DC | PRN
  Administered 2016-10-20: 100 mg via INTRAVENOUS

## 2016-10-20 MED ORDER — HYDROCHLOROTHIAZIDE 25 MG PO TABS
25.0000 mg | ORAL_TABLET | Freq: Every day | ORAL | Status: DC
  Filled 2016-10-20: qty 1

## 2016-10-20 MED ORDER — BSS IO SOLN
INTRAOCULAR | Status: DC | PRN
  Administered 2016-10-20: 15 mL via INTRAOCULAR

## 2016-10-20 MED ORDER — NITROGLYCERIN 0.4 MG SL SUBL: 0.4000 mg | SUBLINGUAL_TABLET | SUBLINGUAL | Status: DC | PRN

## 2016-10-20 MED ORDER — CEFAZOLIN SODIUM-DEXTROSE 2-4 GM/100ML-% IV SOLN
2.0000 g | INTRAVENOUS | Status: AC
  Administered 2016-10-20: 2 g via INTRAVENOUS

## 2016-10-20 MED ORDER — STERILE WATER FOR INJECTION IJ SOLN
INTRAMUSCULAR | Status: DC | PRN
  Administered 2016-10-20: 20 mL

## 2016-10-20 MED ORDER — LIDOCAINE HCL (CARDIAC) 20 MG/ML IV SOLN
INTRAVENOUS | Status: DC | PRN
  Administered 2016-10-20: 50 mg via INTRAVENOUS

## 2016-10-20 MED ORDER — TETRACAINE HCL 0.5 % OP SOLN
2.0000 [drp] | Freq: Once | OPHTHALMIC | Status: DC
  Filled 2016-10-20: qty 2

## 2016-10-20 MED ORDER — HYDROCODONE-ACETAMINOPHEN 5-325 MG PO TABS: 1.0000 | ORAL_TABLET | ORAL | Status: DC | PRN

## 2016-10-20 MED ORDER — ONDANSETRON HCL 4 MG/2ML IJ SOLN: 4.0000 mg | Freq: Four times a day (QID) | INTRAMUSCULAR | Status: DC | PRN

## 2016-10-20 MED ORDER — METOPROLOL TARTRATE 50 MG PO TABS
50.0000 mg | ORAL_TABLET | Freq: Two times a day (BID) | ORAL | Status: DC
  Administered 2016-10-20: 50 mg via ORAL
  Filled 2016-10-20: qty 1

## 2016-10-20 SURGICAL SUPPLY — 60 items
APPLICATOR DR MATTHEWS STRL (MISCELLANEOUS) IMPLANT
BLADE EYE CATARACT 19 1.4 BEAV (BLADE) IMPLANT
BLADE KERATOME 2.75 (BLADE) ×2 IMPLANT
CANNULA VLV SOFT TIP 25GA (OPHTHALMIC) ×2 IMPLANT
CORDS BIPOLAR (ELECTRODE) ×2 IMPLANT
COTTONBALL LRG STERILE PKG (GAUZE/BANDAGES/DRESSINGS) ×6 IMPLANT
COVER MAYO STAND STRL (DRAPES) ×2 IMPLANT
DRAPE INCISE 51X51 W/FILM STRL (DRAPES) IMPLANT
DRAPE OPHTHALMIC 77X100 STRL (CUSTOM PROCEDURE TRAY) ×2 IMPLANT
FILTER BLUE MILLIPORE (MISCELLANEOUS) IMPLANT
FORCEPS ECKARDT ILM 25G SERR (OPHTHALMIC RELATED) IMPLANT
FORCEPS GRIESHABER ILM 25G A (INSTRUMENTS) ×2 IMPLANT
FORCEPS HORIZONTAL 25G DISP (OPHTHALMIC RELATED) IMPLANT
GAS OPHTHALMIC (MISCELLANEOUS) IMPLANT
GLOVE SS BIOGEL STRL SZ 6.5 (GLOVE) ×2 IMPLANT
GLOVE SS BIOGEL STRL SZ 7 (GLOVE) ×1 IMPLANT
GLOVE SUPERSENSE BIOGEL SZ 6.5 (GLOVE) ×2
GLOVE SUPERSENSE BIOGEL SZ 7 (GLOVE) ×1
GLOVE SURG 8.5 LATEX PF (GLOVE) ×2 IMPLANT
GOWN STRL REUS W/ TWL LRG LVL3 (GOWN DISPOSABLE) ×3 IMPLANT
GOWN STRL REUS W/TWL LRG LVL3 (GOWN DISPOSABLE) ×3
HANDLE PNEUMATIC FOR CONSTEL (OPHTHALMIC) ×2 IMPLANT
KIT BASIN OR (CUSTOM PROCEDURE TRAY) ×2 IMPLANT
KIT ROOM TURNOVER OR (KITS) ×2 IMPLANT
KNIFE CRESCENT 1.75 EDGEAHEAD (BLADE) IMPLANT
LENS IOL POST 1PIECE DIOP 20.5 (Intraocular Lens) ×2 IMPLANT
MICROPICK 25G (MISCELLANEOUS)
NEEDLE 18GX1X1/2 (RX/OR ONLY) (NEEDLE) ×2 IMPLANT
NEEDLE 25GX 5/8IN NON SAFETY (NEEDLE) ×2 IMPLANT
NEEDLE 27GX1/2 REG BEVEL ECLIP (NEEDLE) IMPLANT
NEEDLE FILTER BLUNT 18X 1/2SAF (NEEDLE) ×1
NEEDLE FILTER BLUNT 18X1 1/2 (NEEDLE) ×1 IMPLANT
NEEDLE HYPO 30X.5 LL (NEEDLE) ×2 IMPLANT
NS IRRIG 1000ML POUR BTL (IV SOLUTION) ×2 IMPLANT
PACK VITRECTOMY CUSTOM (CUSTOM PROCEDURE TRAY) ×2 IMPLANT
PAD ARMBOARD 7.5X6 YLW CONV (MISCELLANEOUS) ×4 IMPLANT
PAK PIK VITRECTOMY CVS 25GA (OPHTHALMIC) ×2 IMPLANT
PENCIL BIPOLAR 25GA STR DISP (OPHTHALMIC RELATED) IMPLANT
PIC ILLUMINATED 25G (OPHTHALMIC) ×4
PICK MICROPICK 25G (MISCELLANEOUS) IMPLANT
PIK ILLUMINATED 25G (OPHTHALMIC) ×2 IMPLANT
PROBE LASER ILLUM FLEX CVD 25G (OPHTHALMIC) IMPLANT
ROLLS DENTAL (MISCELLANEOUS) ×4 IMPLANT
SCRAPER DIAMOND 25GA (OPHTHALMIC RELATED) IMPLANT
SPONGE SURGIFOAM ABS GEL 12-7 (HEMOSTASIS) ×2 IMPLANT
STOPCOCK 4 WAY LG BORE MALE ST (IV SETS) IMPLANT
SUT CHROMIC 7 0 TG140 8 (SUTURE) ×2 IMPLANT
SUT ETHILON 10 0 CS140 6 (SUTURE) ×2 IMPLANT
SUT ETHILON 9 0 TG140 8 (SUTURE) IMPLANT
SUT POLY NON ABSORB 10-0 8 STR (SUTURE) ×4 IMPLANT
SUT SILK 4 0 RB 1 (SUTURE) ×2 IMPLANT
SYR 20CC LL (SYRINGE) ×2 IMPLANT
SYR 5ML LL (SYRINGE) IMPLANT
SYR BULB 3OZ (MISCELLANEOUS) ×2 IMPLANT
SYR TB 1ML LUER SLIP (SYRINGE) ×2 IMPLANT
SYRINGE 10CC LL (SYRINGE) IMPLANT
TAPE SURG TRANSPORE 1 IN (GAUZE/BANDAGES/DRESSINGS) ×1 IMPLANT
TAPE SURGICAL TRANSPORE 1 IN (GAUZE/BANDAGES/DRESSINGS) ×1
TUBING HIGH PRESS EXTEN 6IN (TUBING) IMPLANT
WATER STERILE IRR 1000ML POUR (IV SOLUTION) ×2 IMPLANT

## 2016-10-20 NOTE — Transfer of Care (Signed)
Immediate Anesthesia Transfer of Care Note  Patient: Valerie [name]  Procedure(s) Performed: Procedure(s) with comments: PARS PLANA VITRECTOMY WITH 25G REMOVAL/SUTURE SECONDARY INTRAOCULAR LENS, GAS FLUID EXCHANGE (Right) LASER PHOTO ABLATION (Right) - Headscope laser  Patient Location: PACU  Anesthesia Type:General  Level of Consciousness: awake, alert , oriented and patient cooperative  Airway & Oxygen Therapy: Patient Spontanous Breathing and Patient connected to face mask oxygen  Post-op Assessment: Report given to RN and Post -op Vital signs reviewed and stable  Post vital signs: Reviewed and stable  Last Vitals:  Vitals:   10/20/16 1522 10/20/16 1523  BP: (!) 167/67   Pulse: 78   Resp: 15   Temp:  36.5 C    Last Pain:  Vitals:   10/20/16 0940  TempSrc: Oral      Patients Stated Pain Goal: 3 ([id number])  Complications: No apparent anesthesia complications

## 2016-10-20 NOTE — Anesthesia Postprocedure Evaluation (Signed)
Anesthesia Post Note  Patient: Valerie [name]  Procedure(s) Performed: Procedure(s) (LRB): PARS PLANA VITRECTOMY WITH 25G REMOVAL/SUTURE SECONDARY INTRAOCULAR LENS, GAS FLUID EXCHANGE (Right) LASER PHOTO ABLATION (Right)  Patient location during evaluation: PACU Anesthesia Type: General Level of consciousness: awake and alert and oriented Pain management: pain level controlled Vital Signs Assessment: post-procedure vital signs reviewed and stable Respiratory status: spontaneous breathing, nonlabored ventilation and respiratory function stable Cardiovascular status: blood pressure returned to baseline and stable Postop Assessment: no signs of nausea or vomiting Anesthetic complications: no       Last Vitals:  Vitals:   10/20/16 1522 10/20/16 1523  BP: (!) 167/67   Pulse: 78   Resp: 15   Temp:  36.5 C    Last Pain:  Vitals:   10/20/16 0940  TempSrc: Oral                 Dontrelle Mazon A.

## 2016-10-20 NOTE — Anesthesia Preprocedure Evaluation (Addendum)
Anesthesia Evaluation  Patient identified by MRN, date of birth, ID band Patient awake    Reviewed: Allergy & Precautions, NPO status , Patient's Chart, lab work & pertinent test results, reviewed documented beta blocker date and time   Airway Mallampati: II  TM Distance: >3 FB Neck ROM: Full    Dental  (+) Lower Dentures, Upper Dentures   Pulmonary neg pulmonary ROS,    Pulmonary exam normal breath sounds clear to auscultation       Cardiovascular hypertension, Pt. on medications and Pt. on home beta blockers + Peripheral Vascular Disease  Normal cardiovascular exam Rhythm:Regular Rate:Normal     Neuro/Psych PSYCHIATRIC DISORDERS Anxiety Depression Dislocated IOL OD  Neuromuscular disease    GI/Hepatic Neg liver ROS, hiatal hernia, GERD  Medicated and Controlled,Hx/o Esophageal stricture   Endo/Other  Hyperlipidemia Thyroid nodule  Renal/GU negative Renal ROS  negative genitourinary   Musculoskeletal  (+) Arthritis , Osteoarthritis,    Abdominal   Peds  Hematology  (+) anemia ,   Anesthesia Other Findings   Reproductive/Obstetrics                              Chemistry      Component Value Date/Time   NA 141 10/20/2016 1041   NA 144 05/25/2016 1201   K 3.2 (L) 10/20/2016 1041   CL 105 10/20/2016 1041   CO2 27 10/20/2016 1041   BUN 23 (H) 10/20/2016 1041   BUN 21 05/25/2016 1201   CREATININE 0.81 10/20/2016 1041   CREATININE 0.80 07/10/2016 0918      Component Value Date/Time   CALCIUM 10.2 10/20/2016 1041   ALKPHOS 68 06/29/2016 2050   AST 21 06/29/2016 2050   ALT 13 (L) 06/29/2016 2050   BILITOT 0.8 06/29/2016 2050   BILITOT 0.4 05/25/2016 1201     Lab Results  Component Value Date   WBC 7.0 10/20/2016   HGB 15.2 (H) 10/20/2016   HCT 44.1 10/20/2016   MCV 95.7 10/20/2016   PLT 305 10/20/2016   EKG: normal sinus rhythm.  Echo 07/31/2017:  - Left ventricle: The  cavity size was normal. There was mild focal   basal hypertrophy of the septum. Systolic function was normal.   The estimated ejection fraction was in the range of 55% to 60%.   Wall motion was normal; there were no regional wall motion   abnormalities. Features are consistent with a pseudonormal left   ventricular filling pattern, with concomitant abnormal relaxation   and increased filling pressure (grade 2 diastolic dysfunction). - Aortic valve: There was mild regurgitation. - Mitral valve: Calcified annulus. There was mild regurgitation. - Left atrium: The atrium was mildly dilated. - Pulmonary arteries: Systolic pressure was mildly increased. PA   peak pressure: 37 mm Hg (S). Anesthesia Physical Anesthesia Plan  ASA: III  Anesthesia Plan: General   Post-op Pain Management:    Induction: Intravenous  Airway Management Planned: Oral ETT  Additional Equipment:   Intra-op Plan:   Post-operative Plan: Extubation in OR  Informed Consent: I have reviewed the patients History and Physical, chart, labs and discussed the procedure including the risks, benefits and alternatives for the proposed anesthesia with the patient or authorized representative who has indicated his/her understanding and acceptance.   Dental advisory given  Plan Discussed with: CRNA, Anesthesiologist and Surgeon  Anesthesia Plan Comments:         Anesthesia Quick Evaluation

## 2016-10-20 NOTE — H&P (Signed)
Valerie [name] is an 83 y.o. female.   Chief Complaint: sudden loss of vision right eye HPI: Lost vision right eye one month ago  Past Medical History:  Diagnosis Date  . Anxiety   . Arthritis   . Carotid artery disease ([place])   . Cataract   . Depression   . Diverticulosis of colon with hemorrhage   . Duodenitis   . Esophageal reflux   . Esophageal stricture   . Essential hypertension   . Glaucoma   . Hiatal hernia   . History of kidney stones     x1   . Hyperlipidemia   . IBS (irritable bowel syndrome)   . Palpitations   . UTI (urinary tract infection) March 2015    Past Surgical History:  Procedure Laterality Date  . CHOLECYSTECTOMY    . COLONOSCOPY    . DILATION AND CURETTAGE OF UTERUS    . EYE SURGERY Bilateral    total of 5  . LEFT ROTATOR CUFF REPAIR X2 Left   . RIGHT ROTATOR CUFF REPAIR X1 Right   . UPPER GASTROINTESTINAL ENDOSCOPY     with dilation    Family History  Problem Relation Age of Onset  . Leukemia Mother   . Colon cancer Father 21  . Kidney cancer Brother   . Bladder Cancer Brother   . Stroke Sister   . Esophageal cancer Neg Hx   . Stomach cancer Neg Hx    Social History:  reports that she has never smoked. She has never used smokeless tobacco. She reports that she does not drink alcohol or use drugs.  Allergies:  Allergies  Allergen Reactions  . Aricept [Donepezil Hcl] Other (See Comments)    Nightmares, near syncope, weak, decreased appetite.  . Lisinopril Other (See Comments)    Hair loss  . Sulfa Antibiotics Nausea Only    Facility-Administered Medications Prior to Admission  Medication Dose Route Frequency Provider Last Rate Last Dose  . cyanocobalamin ((VITAMIN B-12)) injection 1,000 mcg  1,000 mcg Intramuscular Q30 days Claretta Fraise, MD   1,000 mcg at 05/25/16 1200   Medications Prior to Admission  Medication Sig Dispense Refill  . aspirin 81 MG tablet Take 81 mg by mouth daily.      . Difluprednate (DUREZOL) 0.05 % EMUL  Place 1 drop into both eyes 2 (two) times daily.    . Ergocalciferol (VITAMIN D2 PO) Take 1 tablet by mouth 2 (two) times a week.    . hydrochlorothiazide (HYDRODIURIL) 25 MG tablet Take 1 tablet (25 mg total) by mouth daily. 30 tablet 5  . losartan (COZAAR) 25 MG tablet Take 1 tablet (25 mg total) by mouth daily. 30 tablet 6  . metoprolol (LOPRESSOR) 50 MG tablet Take 1 tablet (50 mg total) by mouth 2 (two) times daily. 180 tablet 2  . nepafenac (ILEVRO) 0.3 % ophthalmic suspension Place 1 drop into both eyes daily.    . pravastatin (PRAVACHOL) 10 MG tablet Take 1 tablet (10 mg total) by mouth daily. 90 tablet 2  . nitroGLYCERIN (NITROSTAT) 0.4 MG SL tablet Place 1 tablet (0.4 mg total) under the tongue every 5 (five) minutes as needed for chest pain. 25 tablet 3    Review of systems otherwise negative  Blood pressure (!) 189/60, pulse (!) 57, temperature 97.4 F (36.3 C), temperature source Oral, resp. rate 18, height 5\' 4"  (1.626 m), weight 117 lb (53.1 kg), SpO2 97 %.  Physical exam: Mental status: oriented x3. Eyes: See eye  exam associated with this date of surgery in media tab.  Scanned in by scanning center Ears, Nose, Throat: within normal limits Neck: Within Normal limits General: within normal limits Chest: Within normal limits Breast: deferred Heart: Within normal limits Abdomen: Within normal limits GU: deferred Extremities: within normal limits Skin: within normal limits  Assessment/Plan Dislocated intraocular lens right eye Plan: To [facility] for Pars plana vitrectomy, removal of intraocular lens from vitreous, placement of secondary intraocular lens with suture Right eye  Marri Mcneff, Chrystie Nose 10/20/2016, 10:16 AM

## 2016-10-20 NOTE — Brief Op Note (Signed)
Brief Operative note   Preoperative diagnosis:  dislocated intra ocular lens right eye Postoperative diagnosis  Posterior dislocation of the intraocular lens, capsular bag and cataract remnants  Procedures: pars plana vitrectomy, laser, removal of IOL, lens capsule and lens remanants.  Secondary intraocular lens placement with suture  Right eye  Surgeon:  Hayden Pedro, MD...  Assistant:  Deatra Ina SA     Anesthesia: General  Specimen: none  Estimated blood loss:  1cc  Complications: none  Patient sent to PACU in good condition  Composed by Hayden Pedro MD  Dictation number: [phone]

## 2016-10-20 NOTE — H&P (Signed)
I examined the patient today and there is no change in the medical status 

## 2016-10-20 NOTE — Anesthesia Procedure Notes (Signed)
Procedure Name: Intubation Date/Time: 10/20/2016 1:40 PM Performed by: Shirlyn Goltz Pre-anesthesia Checklist: Patient identified, Emergency Drugs available, Suction available and Patient being monitored Patient Re-evaluated:Patient Re-evaluated prior to inductionOxygen Delivery Method: Circle system utilized Preoxygenation: Pre-oxygenation with 100% oxygen Intubation Type: IV induction Ventilation: Mask ventilation without difficulty and Oral airway inserted - appropriate to patient size Laryngoscope Size: Mac and 3 Grade View: Grade I Tube type: Oral Tube size: 7.0 mm Number of attempts: 1 Airway Equipment and Method: Stylet Placement Confirmation: ETT inserted through vocal cords under direct vision,  positive ETCO2 and breath sounds checked- equal and bilateral Secured at: 19 cm Tube secured with: Tape Dental Injury: Teeth and Oropharynx as per pre-operative assessment

## 2016-10-21 DIAGNOSIS — T8522XA Displacement of intraocular lens, initial encounter: Secondary | ICD-10-CM | POA: Diagnosis not present

## 2016-10-21 MED ORDER — BACITRACIN-POLYMYXIN B 500-10000 UNIT/GM OP OINT: 1.0000 "application " | TOPICAL_OINTMENT | Freq: Three times a day (TID) | OPHTHALMIC | 0 refills | Status: DC

## 2016-10-21 MED ORDER — GATIFLOXACIN 0.5 % OP SOLN: 1.0000 [drp] | Freq: Four times a day (QID) | OPHTHALMIC | Status: DC

## 2016-10-21 MED ORDER — PREDNISOLONE ACETATE 1 % OP SUSP: 1.0000 [drp] | Freq: Four times a day (QID) | OPHTHALMIC | 0 refills | Status: DC

## 2016-10-21 NOTE — Progress Notes (Signed)
10/21/2016, 6:32 AM  Mental Status:  Awake, Alert, Oriented  Anterior segment: Cornea  Clear    Anterior Chamber Small gas bubble in AC    Lens:   IOL    Wounds intact  Intra Ocular Pressure 15 mmHg with Tonopen  Vitreous: Clear 20 %gas bubble   Retina:  Attached Good laser reaction Hemorrhage  Hazy view  Impression: Excellent result Retina attached   Final Diagnosis: Principal Problem:   Dislocated IOL (intraocular lens), posterior, right Active Problems:   Posterior dislocation of iol (intraocular lens), right eye   Plan: start post operative eye drops.  Discharge to home.  Give post operative instructions  Valerie [name] 10/21/2016, 6:32 AM

## 2016-10-21 NOTE — Progress Notes (Signed)
Discharge home.

## 2016-10-21 NOTE — Op Note (Signed)
NAMEARLISA, VANNOSTRAND                  ACCOUNT NO.:  [id number]  MEDICAL RECORD NO.:  [id number]  LOCATION:  [id number]                        FACILITY:  [place]  PHYSICIAN:  Chrystie Nose. Zigmund Daniel, M.D. DATE OF BIRTH:  [birth year]  DATE OF PROCEDURE:  10/20/2016 DATE OF DISCHARGE:                              OPERATIVE REPORT   ADMISSION DIAGNOSIS:  Dislocated intraocular lens, right eye.  PROCEDURE:  Pars plana vitrectomy, removal of dislocated intraocular lens with lens remnants and entire capsule from the vitreous, placement of secondary intraocular lens with suture.  SURGEON:  Chrystie Nose. Zigmund Daniel, M.D.  ASSISTANT:  Deatra Ina SA.  ANESTHESIA:  General.  DETAILS:  Usual prep and drape.  Inspection of the eye showed a large glaucoma valve in the upper temporal quadrant with a tube in the anterior chamber and Tutoplast covering it in the upper temporal quadrant.  The indirect ophthalmoscope laser was moved into place.  546 burns were placed around the retinal periphery with a power between 400 and 600 mW, 1000 microns each, and 0.1 seconds each.  These laser marks were placed in weak areas of the retina.  Attention was then carried to pars plana area where conjunctival peritomy was opened from 12 o'clock to 4 o'clock and from 8 o'clock to 9 o'clock.  A 3-layered corneoscleral wound was opened from 12 o'clock to 2 o'clock, and 25-gauge trocars were placed at 8, 10, and 2 o'clock.  Half-thickness scleral flaps were raised at 3 o'clock and 9 o'clock in anticipation of IOL suture.  Pars plana vitrectomy was begun in the pupillary axis.  There was no pseudophakos in position.  A pseudophakos with the entire capsule and the capsular bag was seen lying on the macula.  The BIOM viewing system was moved into place.  Provisc was placed on the corneal surface.  The vitrectomy was carried posteriorly, and all vitreous remnants were removed from their attachments to the intraocular lens and  the intraocular lens bag and the capsular remnants.  Vitrectomy was carried into the far periphery where additional vitreous and lens remnants were removed.  The corneoscleral wound was opened.  The intraocular lens and the bag were grasped with ILM forceps and brought into the pupillary axis.  0.12 forceps were brought through the corneoscleral wound into the anterior chamber, and the intraocular lens was passed to the 0.2 forceps.  This maneuver allowed the implant and remaining cataract material to be dialed out of the posterior segment into the anterior segment and out through the corneoscleral wound.  The specimen was sent for identification.  Additional vitrectomy was carried out removing all vitreous and capsular remnants.  Two Prolene sutures were passed beneath the scleral flaps from 3 o'clock to 9 o'clock and posterior to the iris in the ciliary sulcus.  The Prolene sutures were brought out of the eye through the corneoscleral wound through the pupil.  A new intraocular lens was brought onto the field.  It was inspected and cleaned, it was made by [employer], model CZ70BD, power 20.5 D, length 12.5 mm, optic 7.0 mm, serial [id number].  Expiration date, June of 2018. The Prolene sutures were attached to the  eyelets of the lens.  The lens was passed through the corneoscleral wound into the anterior chamber and into the posterior chamber.  The lens was dialed into place.  Prolene sutures were drawn securely and knotted externally beneath the scleral flaps.  The flaps were allowed to cover over the knots.  Additional vitrectomy was carried out at this point removing some pigment granules. A 30% gas fluid exchange was carried out.  The instruments were removed from the eye.  The 25-gauge trocars were removed from the eye. Corneoscleral wound was closed with 4 interrupted 10-0 nylon black sutures.  The wound was tested and found to be secure.  The conjunctiva was  reposited with 7-0 chromic suture.  Polymyxin and ceftazidime were rinsed around the globe for antibiotic coverage.  Decadron 10 mg was injected into the lower subconjunctival space.  Atropine solution was applied.  Marcaine was injected around the globe for postop pain. Polysporin ophthalmic ointment was placed.  Closing pressure was 10 with a Pharmacologist.  A patch and shield were placed.  The patient was awakened and taken to recovery in satisfactory condition.  OPERATIVE TIME:  One and a half hours.     Chrystie Nose. Zigmund Daniel, M.D.     JDM/MEDQ  D:  10/20/2016  T:  10/21/2016  Job:  [id number]

## 2016-10-21 NOTE — Progress Notes (Signed)
Discharge instruction given to patient, no question verbalized.

## 2016-10-21 NOTE — Discharge Summary (Signed)
Discharge summary not needed on [facility] patients per medical records. 

## 2016-10-23 ENCOUNTER — Encounter (HOSPITAL_COMMUNITY): Payer: Self-pay | Admitting: Ophthalmology

## 2016-10-27 ENCOUNTER — Encounter (INDEPENDENT_AMBULATORY_CARE_PROVIDER_SITE_OTHER): Payer: Medicare Other | Admitting: Ophthalmology

## 2016-10-27 DIAGNOSIS — H2701 Aphakia, right eye: Secondary | ICD-10-CM

## 2016-11-04 ENCOUNTER — Other Ambulatory Visit: Payer: Self-pay | Admitting: *Deleted

## 2016-11-04 MED ORDER — METOPROLOL TARTRATE 50 MG PO TABS: 50.0000 mg | ORAL_TABLET | Freq: Two times a day (BID) | ORAL | 0 refills | Status: DC

## 2016-11-17 ENCOUNTER — Encounter (INDEPENDENT_AMBULATORY_CARE_PROVIDER_SITE_OTHER): Payer: Medicare Other | Admitting: Ophthalmology

## 2016-11-17 DIAGNOSIS — H2701 Aphakia, right eye: Secondary | ICD-10-CM

## 2016-11-26 ENCOUNTER — Encounter: Payer: Self-pay | Admitting: Cardiovascular Disease

## 2016-11-26 ENCOUNTER — Ambulatory Visit (INDEPENDENT_AMBULATORY_CARE_PROVIDER_SITE_OTHER): Payer: Medicare Other | Admitting: Cardiovascular Disease

## 2016-11-26 ENCOUNTER — Encounter (INDEPENDENT_AMBULATORY_CARE_PROVIDER_SITE_OTHER): Payer: Self-pay

## 2016-11-26 VITALS — BP 110/70 | HR 56 | Ht 64.0 in | Wt 115.0 lb

## 2016-11-26 DIAGNOSIS — E782 Mixed hyperlipidemia: Secondary | ICD-10-CM

## 2016-11-26 DIAGNOSIS — I10 Essential (primary) hypertension: Secondary | ICD-10-CM

## 2016-11-26 DIAGNOSIS — I119 Hypertensive heart disease without heart failure: Secondary | ICD-10-CM

## 2016-11-26 DIAGNOSIS — I493 Ventricular premature depolarization: Secondary | ICD-10-CM | POA: Insufficient documentation

## 2016-11-26 DIAGNOSIS — E785 Hyperlipidemia, unspecified: Secondary | ICD-10-CM | POA: Insufficient documentation

## 2016-11-26 LAB — SPECIMEN STATUS

## 2016-11-26 NOTE — Patient Instructions (Signed)
Medication Instructions:  Your physician recommends that you continue on your current medications as directed. Please refer to the Current Medication list given to you today.   Labwork: TODAY - cholesterol, complete metabolic panel   Testing/Procedures: None Ordered   Follow-Up: Your physician wants you to follow-up in: 6 months with Dr. Nahser.  You will receive a reminder letter in the mail two months in advance. If you don't receive a letter, please call our office to schedule the follow-up appointment.   If you need a refill on your cardiac medications before your next appointment, please call your pharmacy.   Thank you for choosing [facility]! Raynor Calcaterra, RN [phone]    

## 2016-11-26 NOTE — Progress Notes (Addendum)
Cardiology Office Note   Date:  11/26/2016   ID:  Valerie [name], DOB 10/18/1927, MRN [id number]  PCP:  Claretta Fraise, MD  Cardiologist:  Dr. Acie Fredrickson    Chief Complaint  Patient presents with  . Hypertension     Please note pt was followed by Dr. Mare Ferrari and with his retirement was seen once by Dr. Domenic Polite in [place], but her daughter who is followed by Dr. Acie Fredrickson would like her seen by Dr. Acie Fredrickson.  He has agreed to see pt.  Previous notes from [place]:  Valerie [name] is a 83 y.o. female who presents for HTN, bradycardia and dizziness.  PMH of HTN, HLD, anxiety, depression, GERD, CAD, glaucoma-underwent intervention approximately 3 months ago, lives alone but has family living next door who provide close supervision, presented to [facility] ED on 06/30/16 with complaints of ongoing blurred vision for several weeks post procedure,? Intermittent dyspnea and arm pain. Along with chest pain that she states is arthritic.  She checks her blood pressures regularly at home and apparently were elevated in the 220/95 range despite compliance with medications. She apparently has not felt well for a couple of days, headache, poor oral intake and visual changes since surgery for which she has recently been seen by her ophthalmologist and has follow-up in 2 months. In the ED, CT head without acute abnormalities, initial blood pressure 220/77. Admitted for hypertensive urgency.  Her troponin was negative.  Hx of neg nuc study in 2015.    She had HR to 46 (though looking back she has been slow before)..  Meds adjusted and plan for outpt echo.  She had complained about SOB but was not clear today she does have at times.   Was with hypokalemia and follow up was with K+ 3.3.    Hx of carotid disease with last check in 09/2015 with 1-39% bil stenosis. To be seen by vascular in Dec.   On her last visit she complained of dizziness.  Her HR was low so I stopped cardizem and added amlodipine.  Also on 48 hour holter she  was in SR 56 to 60 with occ episodes of sinus brady brief to 42 and occ PACs and PVCs.  Her echo with EF of 55 to 60% G2DD, mild Aortic regurg. LA mildly dialted. PA pk pressure 37 mmHg.   Today she feels better with less palpitations and less dizziness.  She does complain of hair loss.  This began with amlodipine.  She had hair loss with lisinopril as well.  No chest pain and no SOB.    November 26, 2016:  Valerie [name] is seen for the first time today.  Seen with Valerie [name] ( sister)  She is a transfer from Dr. Mare Ferrari. She has a history of hypertension and bradycardia.  BP has been up and down.  She takes it at home  BP has been as high as 185 and as low as 125 Goes out to eat once a week .   Does not pay attention to the salt in her food.   Tries to avoid fried foods.    Has rare episodes of palpitations . Perhaps twice a month. Worse when she is lying down  Had a 48 hr monitor  - revealed PACS and PVCs   Past Medical History:  Diagnosis Date  . Anxiety   . Arthritis   . Carotid artery disease ([place])   . Cataract   . Depression   . Diverticulosis of colon  with hemorrhage   . Duodenitis   . Esophageal reflux   . Esophageal stricture   . Essential hypertension   . Glaucoma   . Hiatal hernia   . History of kidney stones     x1   . Hyperlipidemia   . IBS (irritable bowel syndrome)   . Palpitations   . UTI (urinary tract infection) March 2015    Past Surgical History:  Procedure Laterality Date  . CHOLECYSTECTOMY    . COLONOSCOPY    . DILATION AND CURETTAGE OF UTERUS    . EYE SURGERY Bilateral    total of 5  . LASER PHOTO ABLATION Right 10/20/2016   Procedure: LASER PHOTO ABLATION;  Surgeon: Hayden Pedro, MD;  Location: [facility];  Service: Ophthalmology;  Laterality: Right;  Headscope laser  . LEFT ROTATOR CUFF REPAIR X2 Left   . PARS PLANA VITRECTOMY  10/20/2016   WITH 25G REMOVAL/SUTURE SECONDARY INTRAOCULAR LENS, GAS FLUID EXCHANGE   . PARS PLANA VITRECTOMY Right  10/20/2016   Procedure: PARS PLANA VITRECTOMY WITH 25G REMOVAL/SUTURE SECONDARY INTRAOCULAR LENS, GAS FLUID EXCHANGE;  Surgeon: Hayden Pedro, MD;  Location: [facility];  Service: Ophthalmology;  Laterality: Right;  . RIGHT ROTATOR CUFF REPAIR X1 Right   . UPPER GASTROINTESTINAL ENDOSCOPY     with dilation     Current Outpatient Prescriptions  Medication Sig Dispense Refill  . aspirin 81 MG tablet Take 81 mg by mouth daily.      . bacitracin-polymyxin b (POLYSPORIN) ophthalmic ointment Place 1 application into the right eye 3 (three) times daily. apply to eye every 12 hours while awake 3.5 g 0  . Difluprednate (DUREZOL) 0.05 % EMUL Place 1 drop into both eyes 2 (two) times daily.    . Ergocalciferol (VITAMIN D2 PO) Take 1 tablet by mouth 2 (two) times a week.    Marland Kitchen gatifloxacin (ZYMAXID) 0.5 % SOLN Place 1 drop into the right eye 4 (four) times daily.    . hydrochlorothiazide (HYDRODIURIL) 25 MG tablet Take 1 tablet (25 mg total) by mouth daily. 30 tablet 5  . metoprolol (LOPRESSOR) 50 MG tablet Take 1 tablet (50 mg total) by mouth 2 (two) times daily. 60 tablet 0  . nepafenac (ILEVRO) 0.3 % ophthalmic suspension Place 1 drop into both eyes daily.    . nitroGLYCERIN (NITROSTAT) 0.4 MG SL tablet Place 1 tablet (0.4 mg total) under the tongue every 5 (five) minutes as needed for chest pain. 25 tablet 3  . pravastatin (PRAVACHOL) 10 MG tablet Take 1 tablet (10 mg total) by mouth daily. 90 tablet 2  . prednisoLONE acetate (PRED FORTE) 1 % ophthalmic suspension Place 1 drop into the right eye 4 (four) times daily. 5 mL 0  . losartan (COZAAR) 25 MG tablet Take 1 tablet (25 mg total) by mouth daily. 30 tablet 6   Current Facility-Administered Medications  Medication Dose Route Frequency Provider Last Rate Last Dose  . cyanocobalamin ((VITAMIN B-12)) injection 1,000 mcg  1,000 mcg Intramuscular Q30 days Claretta Fraise, MD   1,000 mcg at 05/25/16 1200    Allergies:   Aricept [donepezil hcl]; Lisinopril;  and Sulfa antibiotics    Social History:  The patient  reports that she has never smoked. She has never used smokeless tobacco. She reports that she does not drink alcohol or use drugs.   Family History:  The patient's family history includes Bladder Cancer in her brother; Colon cancer (age of onset: 33) in her father; Kidney cancer in  her brother; Leukemia in her mother; Stroke in her sister.    ROS:  General:no colds or fevers, + weight changes Skin:no rashes or ulcers HEENT:no blurred vision, no congestion, + hairloss CV:see HPI PUL:see HPI GI:no diarrhea constipation or melena, no indigestion GU:no hematuria, no dysuria MS:no joint pain, no claudication Neuro:no syncope, occ lightheadedness but improved Endo:no diabetes, no thyroid disease  Wt Readings from Last 3 Encounters:  11/26/16 115 lb (52.2 kg)  10/20/16 110 lb (49.9 kg)  08/25/16 115 lb (52.2 kg)     PHYSICAL EXAM: VS:  BP 110/70   Pulse (!) 56   Ht 5\' 4"  (1.626 m)   Wt 115 lb (52.2 kg)   SpO2 97%   BMI 19.74 kg/m  , BMI Body mass index is 19.74 kg/m. General:Pleasant affect, NAD Skin:Warm and dry, brisk capillary refill HEENT:normocephalic, sclera clear, mucus membranes moist Heart:S1S2 RRR with soft systolic murmur, no gallup, rub or click Lungs:clear without rales, rhonchi, or wheezes [id number], non tender, + BS, do not palpate liver spleen or masses Ext:no lower ext edema, 2+ pedal pulses, 2+ radial pulses Neuro:alert and oriented, MAE, follows commands, + facial symmetry  EKG:  EKG is ordered today. The ekg ordered today demonstrates sinus Brady at 50 no ST changes.    Recent Labs: 06/29/2016: ALT 13 10/20/2016: BUN 23; Creatinine, Ser 0.81; Hemoglobin 15.2; Platelets 305; Potassium 3.2; Sodium 141   Lipid Panel    Component Value Date/Time   CHOL 194 05/25/2016 1201   TRIG 137 05/25/2016 1201   HDL 54 05/25/2016 1201   CHOLHDL 3.6 05/25/2016 1201   CHOLHDL 4 12/05/2014 0946   VLDL 26.0  12/05/2014 0946   LDLCALC 113 (H) 05/25/2016 1201     Other studies Reviewed: Additional studies/ records that were reviewed today include:  Echo 07/31/16 Study Conclusions  - Left ventricle: The cavity size was normal. There was mild focal   basal hypertrophy of the septum. Systolic function was normal.   The estimated ejection fraction was in the range of 55% to 60%.   Wall motion was normal; there were no regional wall motion   abnormalities. Features are consistent with a pseudonormal left   ventricular filling pattern, with concomitant abnormal relaxation   and increased filling pressure (grade 2 diastolic dysfunction). - Aortic valve: There was mild regurgitation. - Mitral valve: Calcified annulus. There was mild regurgitation. - Left atrium: The atrium was mildly dilated. - Pulmonary arteries: Systolic pressure was mildly increased. PA   peak pressure: 37 mm Hg (S).   ASSESSMENT AND PLAN:  1.  HTN:   Recheck BP looks fine.  Continue current meds   2. Chronic hx of palpatitations  And bradycardia -   She has premature atrial contractions and premature ventricular contractions on 48 hour Holter monitor.  3. Hx of cardiac murmur, previous echo 2009.  Echo 07/31/16 with EF 55-60% G2DD, mild AR, PA pressure elevated at 37 mmHg.   4. Hyperlipidemia: We'll check fasting labs today. Continue current dose of Pravachol.  4. Carotid disease:   followed by vascular.    Current medicines are reviewed with the patient today.  The patient Has no concerns regarding medicines.  The following changes have been made:  See above Labs/ tests ordered today include:see above  Disposition:   FU:  I'll see her again in 6 months.  Signed, Mertie Moores, MD  11/26/2016 10:52 AM    [facility], [place], [place]  [place], [place] Phone: [phone]; Fax: [phone]

## 2016-11-27 ENCOUNTER — Telehealth: Payer: Self-pay | Admitting: Nurse Practitioner

## 2016-11-27 LAB — COMPREHENSIVE METABOLIC PANEL
ALK PHOS: 81 IU/L (ref 39–117)
ALT: 14 IU/L (ref 0–32)
AST: 21 IU/L (ref 0–40)
Albumin/Globulin Ratio: 1.6 (ref 1.2–2.2)
Albumin: 4.3 g/dL (ref 3.5–4.7)
BILIRUBIN TOTAL: 0.4 mg/dL (ref 0.0–1.2)
BUN/Creatinine Ratio: 20 (ref 12–28)
BUN: 17 mg/dL (ref 8–27)
CHLORIDE: 101 mmol/L (ref 96–106)
CO2: 22 mmol/L (ref 18–29)
Calcium: 9.4 mg/dL (ref 8.7–10.3)
Creatinine, Ser: 0.85 mg/dL (ref 0.57–1.00)
GFR calc non Af Amer: 62 mL/min/{1.73_m2} (ref >59)
GFR, EST AFRICAN AMERICAN: 72 mL/min/{1.73_m2} (ref >59)
GLUCOSE: 85 mg/dL (ref 65–99)
Globulin, Total: 2.7 g/dL (ref 1.5–4.5)
Potassium: 3.5 mmol/L (ref 3.5–5.2)
Sodium: 142 mmol/L (ref 134–144)
TOTAL PROTEIN: 7 g/dL (ref 6.0–8.5)

## 2016-11-27 LAB — LIPID PANEL
CHOL/HDL RATIO: 3.2 ratio (ref 0.0–4.4)
Cholesterol, Total: 177 mg/dL (ref 100–199)
HDL: 56 mg/dL (ref >39)
LDL Calculated: 101 mg/dL — ABNORMAL HIGH (ref 0–99)
Triglycerides: 98 mg/dL (ref 0–149)
VLDL Cholesterol Cal: 20 mg/dL (ref 5–40)

## 2016-11-27 NOTE — Telephone Encounter (Signed)
Normal lab results reviewed with patient who verbalized understanding and thanked me for the call

## 2016-11-27 NOTE — Addendum Note (Signed)
Addended by: Velna Ochs on: 11/27/2016 01:18 PM   Modules accepted: Miquel Dunn

## 2016-12-02 ENCOUNTER — Other Ambulatory Visit: Payer: Self-pay

## 2016-12-02 MED ORDER — HYDROCHLOROTHIAZIDE 25 MG PO TABS: 25.0000 mg | ORAL_TABLET | Freq: Every day | ORAL | 2 refills | Status: DC

## 2016-12-03 ENCOUNTER — Other Ambulatory Visit: Payer: Self-pay | Admitting: *Deleted

## 2016-12-03 MED ORDER — PRAVASTATIN SODIUM 10 MG PO TABS: 10.0000 mg | ORAL_TABLET | Freq: Every day | ORAL | 3 refills | Status: DC

## 2017-01-29 ENCOUNTER — Other Ambulatory Visit: Payer: Self-pay | Admitting: Cardiovascular Disease

## 2017-01-29 MED ORDER — METOPROLOL TARTRATE 50 MG PO TABS: 50.0000 mg | ORAL_TABLET | Freq: Two times a day (BID) | ORAL | 9 refills | Status: DC

## 2017-03-02 ENCOUNTER — Other Ambulatory Visit: Payer: Self-pay | Admitting: Cardiology

## 2017-03-02 DIAGNOSIS — I119 Hypertensive heart disease without heart failure: Secondary | ICD-10-CM

## 2017-03-02 DIAGNOSIS — I160 Hypertensive urgency: Secondary | ICD-10-CM

## 2017-03-03 ENCOUNTER — Encounter: Payer: Self-pay | Admitting: Pediatrics

## 2017-03-03 ENCOUNTER — Ambulatory Visit (INDEPENDENT_AMBULATORY_CARE_PROVIDER_SITE_OTHER): Payer: Medicare Other | Admitting: Pediatrics

## 2017-03-03 VITALS — BP 122/68 | HR 63 | Temp 96.9°F | Ht 64.0 in | Wt 115.8 lb

## 2017-03-03 DIAGNOSIS — S2231XD Fracture of one rib, right side, subsequent encounter for fracture with routine healing: Secondary | ICD-10-CM

## 2017-03-03 DIAGNOSIS — S42001D Fracture of unspecified part of right clavicle, subsequent encounter for fracture with routine healing: Secondary | ICD-10-CM | POA: Diagnosis not present

## 2017-03-03 DIAGNOSIS — I10 Essential (primary) hypertension: Secondary | ICD-10-CM

## 2017-03-03 DIAGNOSIS — M25511 Pain in right shoulder: Secondary | ICD-10-CM

## 2017-03-03 MED ORDER — HYDROCODONE-ACETAMINOPHEN 5-325 MG PO TABS: ORAL_TABLET | ORAL | 0 refills | Status: DC

## 2017-03-03 NOTE — Progress Notes (Signed)
  Subjective:   Patient ID: Valerie [name], female    DOB: [birth year], 83 y.o.   MRN: [id number] CC: Hospitalization Follow-up (Fall, Broke Ribs and collar bone, 5/31)  HPI: Valerie [name] is a 83 y.o. female presenting for Hospitalization Follow-up (Fall, Broke Ribs and collar bone, 5/31)  Here today with her son  The patient was discharged from [facility] on 5/31 with a primary diagnosis of syncope, rib fracture, clavical fracture.  Doesn't remember what happened with the fall R eye is blurry all the time, has about 10% vision in the eye Was outside at daughters house when she had the fall, out of usual living arrangements Tripped and fell, did hit her head Was admitted for pain control and PT Has been using incentive spirometer regulalry at home Primarily bothered by R shoulder pain now Does still have pain with coughing Takes hydrocodone 1-2 times a day Helps with pain when she has it No causing sleepiness PT coming out to house starting tomorrow Keeping R arm in sling  No CP or SOB Sometimes having palpitations, no worse than usual, follows with cardiologyy, premature AC on holter monitor per cardiology note  HTN: no recent elevated numbers  Hyperlipidemia: taking pravastatin regularly  Relevant past medical, surgical, family and social history reviewed. Allergies and medications reviewed and updated. History  Smoking Status  . Never Smoker  Smokeless Tobacco  . Never Used   ROS: Per HPI   Objective:    BP 122/68   Pulse 63   Temp (!) 96.9 F (36.1 C) (Oral)   Ht 5\' 4"  (1.626 m)   Wt 115 lb 12.8 oz (52.5 kg)   BMI 19.88 kg/m   Wt Readings from Last 3 Encounters:  03/03/17 115 lb 12.8 oz (52.5 kg)  11/26/16 115 lb (52.2 kg)  10/20/16 110 lb (49.9 kg)    Gen: NAD, alert, cooperative with exam, NCAT EYES: EOMI, no conjunctival injection, or no icterus ENT:   OP without erythema LYMPH: no cervical LAD CV: NRRR, normal S1/S2, no murmur, distal pulses 2+  b/l Resp: CTABL, no wheezes, normal WOB, able to take deep breaths Abd: +BS, soft, NTND. no guarding or organomegaly Ext: No edema, warm Neuro: Alert and oriented, strength equal b/l UE and LE, coordination grossly normal MSK: R arm in sling Yellow bruising R anterior shoulder going down into axilla  Assessment & Plan:  Valerie [name] was seen today for hospitalization follow-up.  Diagnoses and all orders for this visit: Closed nondisplaced fracture of right clavicle with routine healing, unspecified part of clavicle, subsequent encounter Open fracture of one rib of right side with routine healing, subsequent encounter Acute pain of right shoulder I have reviewed the hospital records Pt high risk for falls, decreased vision R eye Family members have been staying with her, family has been very supportive Use hydrocodone only as long as needed, has been doing well getting up, walking and moving regularly, about to start PT One additional Rx for #30 tabs of below given  -     HYDROcodone-acetaminophen (NORCO/VICODIN) 5-325 MG tablet; TAKE ONE-HALF TO ONE TABLET BY MOUTH EVERY FOUR TO SIX HOURS AS NEEDED FOR PAIN  Essential hypertension Stable, cont current meds  HLD Stable, cont pravastatin  Follow up plan: Return in about 3 months (around 06/03/2017). Assunta Found, MD [place]

## 2017-03-11 ENCOUNTER — Telehealth: Payer: Self-pay | Admitting: *Deleted

## 2017-03-11 NOTE — Telephone Encounter (Signed)
Home health nurse is calling with questions. She needs to know when sling can come off and when she can start ROM?

## 2017-03-12 NOTE — Telephone Encounter (Signed)
Home health is calling again. They need some info on her, they are trying to work with her but do not know what they can and can't do. Please have you or your nurse to call her at [phone]

## 2017-03-17 NOTE — Telephone Encounter (Signed)
Can you call [place] and pt, she should be doing daily ROM with elbow, several times a day, taking arm out of sling for that. Does she have follow up with ortho for the clavicle fracture? If not she needs to see me this week or next for follow up xrays.

## 2017-03-17 NOTE — Telephone Encounter (Addendum)
HH Becky aware of recommendations  TC to pt aware she can do ROM exercises & to call back to make an appt with Dr. Evette Doffing for follow up & x-rays.

## 2017-03-22 ENCOUNTER — Ambulatory Visit (INDEPENDENT_AMBULATORY_CARE_PROVIDER_SITE_OTHER): Payer: Medicare Other

## 2017-03-22 ENCOUNTER — Other Ambulatory Visit (INDEPENDENT_AMBULATORY_CARE_PROVIDER_SITE_OTHER): Payer: Medicare Other

## 2017-03-22 ENCOUNTER — Other Ambulatory Visit: Payer: Self-pay | Admitting: Pediatrics

## 2017-03-22 DIAGNOSIS — M25511 Pain in right shoulder: Secondary | ICD-10-CM

## 2017-03-22 DIAGNOSIS — R52 Pain, unspecified: Secondary | ICD-10-CM

## 2017-03-23 ENCOUNTER — Telehealth: Payer: Self-pay | Admitting: Pediatrics

## 2017-03-23 NOTE — Telephone Encounter (Signed)
Spoke with [employer] and advised that Evette Doffing would like to see patient.

## 2017-03-26 ENCOUNTER — Encounter: Payer: Self-pay | Admitting: Pediatrics

## 2017-03-26 ENCOUNTER — Ambulatory Visit (INDEPENDENT_AMBULATORY_CARE_PROVIDER_SITE_OTHER): Payer: Medicare Other | Admitting: Pediatrics

## 2017-03-26 VITALS — BP 135/60 | HR 51 | Temp 97.2°F | Ht 64.0 in | Wt 116.2 lb

## 2017-03-26 DIAGNOSIS — S42001D Fracture of unspecified part of right clavicle, subsequent encounter for fracture with routine healing: Secondary | ICD-10-CM

## 2017-03-26 NOTE — Progress Notes (Signed)
  Subjective:   Patient ID: Valerie [name], female    DOB: 04-Mar-1928, 83 y.o.   MRN: [id number] CC: Follow-up (Broken Collar Bone)  HPI: Valerie [name] is a 83 y.o. female presenting for Follow-up (Broken Collar Bone)  Now apprx 4 weeks from fracture collar bone Much improved pain Not needing pain medication Says she has continued to use sling for R arm Doing ROM with elbow With arm out of sling can move arm in all directions without pain Says it feels back to normal  Relevant past medical, surgical, family and social history reviewed. Allergies and medications reviewed and updated. History  Smoking Status  . Never Smoker  Smokeless Tobacco  . Never Used   ROS: Per HPI   Objective:    BP 135/60   Pulse (!) 51   Temp 97.2 F (36.2 C) (Oral)   Ht 5\' 4"  (1.626 m)   Wt 116 lb 3.2 oz (52.7 kg)   BMI 19.95 kg/m   Wt Readings from Last 3 Encounters:  03/26/17 116 lb 3.2 oz (52.7 kg)  03/03/17 115 lb 12.8 oz (52.5 kg)  11/26/16 115 lb (52.2 kg)    Gen: NAD, alert, cooperative with exam, NCAT EYES: EOMI, no conjunctival injection, or no icterus CV: NRRR, normal S1/S2 Resp: CTABL, no wheezes, normal WOB Ext: No edema, warm Neuro: Alert and oriented MSK: no point tenderness over collar bone No pain with shoulder ROM  Assessment & Plan:  Valerie [name] was seen today for follow-up collar bone fracture  Diagnoses and all orders for this visit:  Closed nondisplaced fracture of right clavicle with routine healing, unspecified part of clavicle, subsequent encounter Cont elbow ROM, OK to start shoulder ROM with HH May resume normal activities OK to stop use of sling  Follow up plan: 3 mo Assunta Found, MD [place]

## 2017-03-26 NOTE — Patient Instructions (Signed)
Ok to start return to normal activities Can work with range of motion elbow, shouler Use sling as needed

## 2017-04-27 ENCOUNTER — Ambulatory Visit (INDEPENDENT_AMBULATORY_CARE_PROVIDER_SITE_OTHER): Payer: Medicare Other | Admitting: Family Medicine

## 2017-04-27 DIAGNOSIS — I2510 Atherosclerotic heart disease of native coronary artery without angina pectoris: Secondary | ICD-10-CM | POA: Diagnosis not present

## 2017-04-27 DIAGNOSIS — S2231XD Fracture of one rib, right side, subsequent encounter for fracture with routine healing: Secondary | ICD-10-CM | POA: Diagnosis not present

## 2017-04-27 DIAGNOSIS — H409 Unspecified glaucoma: Secondary | ICD-10-CM

## 2017-04-27 DIAGNOSIS — H5461 Unqualified visual loss, right eye, normal vision left eye: Secondary | ICD-10-CM

## 2017-04-27 DIAGNOSIS — S42024D Nondisplaced fracture of shaft of right clavicle, subsequent encounter for fracture with routine healing: Secondary | ICD-10-CM

## 2017-04-27 DIAGNOSIS — I10 Essential (primary) hypertension: Secondary | ICD-10-CM

## 2017-04-27 DIAGNOSIS — K219 Gastro-esophageal reflux disease without esophagitis: Secondary | ICD-10-CM

## 2017-04-28 ENCOUNTER — Other Ambulatory Visit: Payer: Self-pay | Admitting: Cardiology

## 2017-04-28 DIAGNOSIS — I119 Hypertensive heart disease without heart failure: Secondary | ICD-10-CM

## 2017-04-28 DIAGNOSIS — I160 Hypertensive urgency: Secondary | ICD-10-CM

## 2017-05-25 ENCOUNTER — Encounter: Payer: Self-pay | Admitting: Physician Assistant

## 2017-05-25 ENCOUNTER — Ambulatory Visit (INDEPENDENT_AMBULATORY_CARE_PROVIDER_SITE_OTHER): Payer: Medicare Other | Admitting: Physician Assistant

## 2017-05-25 VITALS — BP 150/70 | HR 58 | Ht 64.0 in | Wt 116.8 lb

## 2017-05-25 DIAGNOSIS — E785 Hyperlipidemia, unspecified: Secondary | ICD-10-CM

## 2017-05-25 DIAGNOSIS — I779 Disorder of arteries and arterioles, unspecified: Secondary | ICD-10-CM

## 2017-05-25 DIAGNOSIS — I10 Essential (primary) hypertension: Secondary | ICD-10-CM

## 2017-05-25 DIAGNOSIS — I739 Peripheral vascular disease, unspecified: Secondary | ICD-10-CM

## 2017-05-25 DIAGNOSIS — R0789 Other chest pain: Secondary | ICD-10-CM | POA: Diagnosis not present

## 2017-05-25 NOTE — Progress Notes (Signed)
Cardiology Office Note:    Date:  05/25/2017   ID:  MONTZERRAT BRUNELL, DOB Aug 20, 1928, MRN [id number]  PCP:  Eustaquio Maize, MD  Cardiologist:  Dr. Liam Rogers    Referring MD: Eustaquio Maize, MD   Chief Complaint  Patient presents with  . Follow-up    palpitations, HTN    History of Present Illness:    Valerie [name] is a 83 y.o. female with a hx of HTN, bradycardia, HL, anxiety/depression, GERD, carotid artery disease, glaucoma. Prior patient of Dr. Mare Ferrari. She is now followed by Dr. Acie Fredrickson. Last seen 3/18.   Valerie [name] returns for follow up.  She is here with her sister.  She is doing well.  She fell on her porch a few months ago and broke her R clavicle.  She had 1 episode of chest pain last month that lasted seconds.  It occurred at rest and radiated up to her neck and down her arms.  She has not had any further chest pain.  She plays corn hole and is very good at it.  She also does her own house work.  She denies exertional chest pain, dyspnea on exertion, syncope, paroxysmal nocturnal dyspnea, edema.    Prior CV studies:   The following studies were reviewed today:  48 hour Holter 11/17  NSR with occasional episodes of sinus brady .  Occasional PACs and PVCs  Echo 11/17 Mild focal basal septal hypertrophy, EF 55-60, normal wall motion, grade 2 diastolic dysfunction, mild AI, MAC, mild MR, mild LAE, PASP 37  Carotid US 1/17 Bilateral ICA 1-39  Nuclear stress test 4/15 Low risk stress nuclear study with lateral wall attenuation artifact. No ischemia  Past Medical History:  Diagnosis Date  . Anxiety   . Arthritis   . Broken ribs   . Carotid artery disease ([place])   . Cataract   . Collar bone fracture    Right  . Depression   . Diverticulosis of colon with hemorrhage   . Duodenitis   . Esophageal reflux   . Esophageal stricture   . Essential hypertension   . Glaucoma   . Hiatal hernia   . History of kidney stones     x1   . Hyperlipidemia   . IBS (irritable  bowel syndrome)   . Palpitations   . UTI (urinary tract infection) March 2015    Past Surgical History:  Procedure Laterality Date  . CHOLECYSTECTOMY    . COLONOSCOPY    . DILATION AND CURETTAGE OF UTERUS    . EYE SURGERY Bilateral    total of 5  . LASER PHOTO ABLATION Right 10/20/2016   Procedure: LASER PHOTO ABLATION;  Surgeon: Hayden Pedro, MD;  Location: [facility];  Service: Ophthalmology;  Laterality: Right;  Headscope laser  . LEFT ROTATOR CUFF REPAIR X2 Left   . PARS PLANA VITRECTOMY  10/20/2016   WITH 25G REMOVAL/SUTURE SECONDARY INTRAOCULAR LENS, GAS FLUID EXCHANGE   . PARS PLANA VITRECTOMY Right 10/20/2016   Procedure: PARS PLANA VITRECTOMY WITH 25G REMOVAL/SUTURE SECONDARY INTRAOCULAR LENS, GAS FLUID EXCHANGE;  Surgeon: Hayden Pedro, MD;  Location: [facility];  Service: Ophthalmology;  Laterality: Right;  . RIGHT ROTATOR CUFF REPAIR X1 Right   . UPPER GASTROINTESTINAL ENDOSCOPY     with dilation    Current Medications: Current Meds  Medication Sig  . aspirin 81 MG tablet Take 81 mg by mouth daily.    . hydrochlorothiazide (HYDRODIURIL) 25 MG tablet Take 1 tablet (  25 mg total) by mouth daily.  Marland Kitchen HYDROcodone-acetaminophen (NORCO/VICODIN) 5-325 MG tablet TAKE ONE-HALF TO ONE TABLET BY MOUTH EVERY FOUR TO SIX HOURS AS NEEDED FOR PAIN  . losartan (COZAAR) 25 MG tablet Take 1 Tablet by mouth once daily  . metoprolol (LOPRESSOR) 50 MG tablet Take 1 tablet (50 mg total) by mouth 2 (two) times daily.  . nitroGLYCERIN (NITROSTAT) 0.4 MG SL tablet Place 1 tablet (0.4 mg total) under the tongue every 5 (five) minutes as needed for chest pain.  . pravastatin (PRAVACHOL) 10 MG tablet Take 1 tablet (10 mg total) by mouth daily.  Marland Kitchen SHINGRIX injection For administration pursuant to protocol  . Vitamin D, Ergocalciferol, (DRISDOL) 50000 units CAPS capsule TAKE ONE CAPSULE BY MOUTH EVERY SEVEN DAYS     Allergies:   Aricept [donepezil hcl]; Lisinopril; and Sulfa antibiotics   Social History    Social History  . Marital status: Widowed    Spouse name: N/A  . Number of children: N/A  . Years of education: N/A   Occupational History  . retired Retired   Social History Main Topics  . Smoking status: Never Smoker  . Smokeless tobacco: Never Used  . Alcohol use No  . Drug use: No  . Sexual activity: Not Asked   Other Topics Concern  . None   Social History Narrative  . None     Family Hx: The patient's family history includes Bladder Cancer in her brother; Colon cancer (age of onset: 11) in her father; Kidney cancer in her brother; Leukemia in her mother; Stroke in her sister. There is no history of Esophageal cancer or Stomach cancer.  ROS:   Please see the history of present illness.    ROS All other systems reviewed and are negative.   EKGs/Labs/Other Test Reviewed:    EKG:  EKG is  ordered today.  The ekg ordered today demonstrates sinus brady, HR 54, normal axis, QTc 455 ms  Recent Labs: 11/26/2016: ALT 14; BUN 17; Creatinine, Ser 0.85; Hemoglobin WILL FOLLOW; Platelets WILL FOLLOW; Potassium 3.5; Sodium 142   Recent Lipid Panel Lab Results  Component Value Date/Time   CHOL 177 11/26/2016 11:04 AM   TRIG 98 11/26/2016 11:04 AM   HDL 56 11/26/2016 11:04 AM   CHOLHDL 3.2 11/26/2016 11:04 AM   CHOLHDL 4 12/05/2014 09:46 AM   LDLCALC 101 (H) 11/26/2016 11:04 AM    Physical Exam:    VS:  BP (!) 150/70   Pulse (!) 58   Ht _0  (1.626 m)   Wt 116 lb 12.8 oz (53 kg)   SpO2 97%   BMI 20.05 kg/m     Wt Readings from Last 3 Encounters:  05/25/17 116 lb 12.8 oz (53 kg)  03/26/17 116 lb 3.2 oz (52.7 kg)  03/03/17 115 lb 12.8 oz (52.5 kg)     Physical Exam  Constitutional: She is oriented to person, place, and time. She appears well-developed and well-nourished. No distress.  HENT:  Head: Normocephalic and atraumatic.  Eyes: No scleral icterus.  Neck: No JVD present.  Cardiovascular: Normal rate and regular rhythm.   No murmur  heard. Pulmonary/Chest: Effort normal. She has no rales.  Abdominal: Soft. There is no tenderness.  Musculoskeletal: She exhibits no edema.  Neurological: She is alert and oriented to person, place, and time.  Skin: Skin is warm and dry.  Psychiatric: She has a normal mood and affect.    ASSESSMENT:    1. Other chest pain  2. Essential hypertension   3. Hyperlipidemia, unspecified hyperlipidemia type   4. Bilateral carotid artery disease ([place])    PLAN:    In order of problems listed above:  1. Other chest pain Atypical chest pain.  Suspect GI etiology.  ECG today is unchanged.  No further workup. If she has recurrent pain, consider stress testing.   2. Essential hypertension - Plan: Basic Metabolic Panel (BMET) The patient's blood pressure is controlled on her current regimen.  Continue current therapy.  BMET today.  3. Hyperlipidemia, unspecified hyperlipidemia type - Plan: Hepatic function panel LDL optimal on most recent lab work.  Continue current Rx.  Check LFTs today.   4. Bilateral carotid artery disease ([facility])  Followed by [facility].  Continue ASA, statin.   Dispo:  Return in about 6 months (around 11/25/2017) for Routine Follow Up, w/ Dr. Acie Fredrickson.    Medication Adjustments/Labs and Tests Ordered: Current medicines are reviewed at length with the patient today.  Concerns regarding medicines are outlined above.  Tests Ordered: Orders Placed This Encounter  Procedures  . Basic Metabolic Panel (BMET)  . Hepatic function panel  . EKG 12-Lead   Medication Changes: No orders of the defined types were placed in this encounter.   Signed, Richardson Dopp, PA-C  05/25/2017 2:24 PM    [facility] [address], [place], [place]  [address] Phone: [phone]; Fax: [phone]

## 2017-05-25 NOTE — Patient Instructions (Signed)
Medication Instructions:  Your physician recommends that you continue on your current medications as directed. Please refer to the Current Medication list given to you today.   Labwork: TODAY BMET, LFT  Testing/Procedures: NONE ORDERED   Follow-Up: Your physician wants you to follow-up in: [place] DR. Acie Fredrickson You will receive a reminder letter in the mail two months in advance. If you don't receive a letter, please call our office to schedule the follow-up appointment.   Any Other Special Instructions Will Be Listed Below (If Applicable).     If you need a refill on your cardiac medications before your next appointment, please call your pharmacy.

## 2017-05-26 LAB — BASIC METABOLIC PANEL
BUN/Creatinine Ratio: 27 (ref 12–28)
BUN: 21 mg/dL (ref 8–27)
CALCIUM: 9.5 mg/dL (ref 8.7–10.3)
CHLORIDE: 105 mmol/L (ref 96–106)
CO2: 26 mmol/L (ref 20–29)
Creatinine, Ser: 0.77 mg/dL (ref 0.57–1.00)
GFR calc Af Amer: 80 mL/min/{1.73_m2} (ref >59)
GFR, EST NON AFRICAN AMERICAN: 70 mL/min/{1.73_m2} (ref >59)
Glucose: 115 mg/dL — ABNORMAL HIGH (ref 65–99)
POTASSIUM: 4.3 mmol/L (ref 3.5–5.2)
Sodium: 146 mmol/L — ABNORMAL HIGH (ref 134–144)

## 2017-05-26 LAB — HEPATIC FUNCTION PANEL
ALBUMIN: 3.9 g/dL (ref 3.5–4.7)
ALT: 13 IU/L (ref 0–32)
AST: 18 IU/L (ref 0–40)
Alkaline Phosphatase: 85 IU/L (ref 39–117)
BILIRUBIN TOTAL: 0.4 mg/dL (ref 0.0–1.2)
BILIRUBIN, DIRECT: 0.11 mg/dL (ref 0.00–0.40)
Total Protein: 6.5 g/dL (ref 6.0–8.5)

## 2017-07-02 ENCOUNTER — Ambulatory Visit: Payer: Medicare Other | Admitting: Cardiovascular Disease

## 2017-08-03 ENCOUNTER — Other Ambulatory Visit: Payer: Self-pay | Admitting: Family Medicine

## 2017-08-04 NOTE — Telephone Encounter (Signed)
Take OTC vitamin D 1000-2000iu daily

## 2017-08-25 ENCOUNTER — Emergency Department (HOSPITAL_COMMUNITY): Payer: Medicare Other

## 2017-08-25 ENCOUNTER — Emergency Department (HOSPITAL_COMMUNITY)
Admission: EM | Admit: 2017-08-25 | Discharge: 2017-08-25 | Disposition: A | Discharge status: Home / Self Care | Payer: Medicare Other | Attending: Emergency Medicine | Admitting: Emergency Medicine

## 2017-08-25 ENCOUNTER — Encounter (HOSPITAL_COMMUNITY): Payer: Self-pay

## 2017-08-25 ENCOUNTER — Other Ambulatory Visit: Payer: Self-pay

## 2017-08-25 DIAGNOSIS — Z7982 Long term (current) use of aspirin: Secondary | ICD-10-CM | POA: Insufficient documentation

## 2017-08-25 DIAGNOSIS — I10 Essential (primary) hypertension: Secondary | ICD-10-CM | POA: Diagnosis present

## 2017-08-25 DIAGNOSIS — Z79899 Other long term (current) drug therapy: Secondary | ICD-10-CM | POA: Diagnosis not present

## 2017-08-25 DIAGNOSIS — E785 Hyperlipidemia, unspecified: Secondary | ICD-10-CM | POA: Diagnosis not present

## 2017-08-25 DIAGNOSIS — I2510 Atherosclerotic heart disease of native coronary artery without angina pectoris: Secondary | ICD-10-CM | POA: Insufficient documentation

## 2017-08-25 LAB — CBC WITH DIFFERENTIAL/PLATELET
BASOS ABS: 0 10*3/uL (ref 0.0–0.1)
Basophils Relative: 0 %
Eosinophils Absolute: 0.1 10*3/uL (ref 0.0–0.7)
Eosinophils Relative: 2 %
HEMATOCRIT: 39.4 % (ref 36.0–46.0)
Hemoglobin: 13 g/dL (ref 12.0–15.0)
LYMPHS PCT: 30 %
Lymphs Abs: 2.2 10*3/uL (ref 0.7–4.0)
MCH: 32.7 pg (ref 26.0–34.0)
MCHC: 33 g/dL (ref 30.0–36.0)
MCV: 99 fL (ref 78.0–100.0)
MONO ABS: 0.5 10*3/uL (ref 0.1–1.0)
MONOS PCT: 7 %
NEUTROS ABS: 4.6 10*3/uL (ref 1.7–7.7)
Neutrophils Relative %: 61 %
Platelets: 350 10*3/uL (ref 150–400)
RBC: 3.98 MIL/uL (ref 3.87–5.11)
RDW: 12.6 % (ref 11.5–15.5)
WBC: 7.5 10*3/uL (ref 4.0–10.5)

## 2017-08-25 LAB — BASIC METABOLIC PANEL
ANION GAP: 8 (ref 5–15)
BUN: 19 mg/dL (ref 6–20)
CALCIUM: 9.8 mg/dL (ref 8.9–10.3)
CO2: 29 mmol/L (ref 22–32)
Chloride: 104 mmol/L (ref 101–111)
Creatinine, Ser: 0.72 mg/dL (ref 0.44–1.00)
GFR calc Af Amer: >60 mL/min (ref >60)
Glucose, Bld: 83 mg/dL (ref 65–99)
Potassium: 3.8 mmol/L (ref 3.5–5.1)
Sodium: 141 mmol/L (ref 135–145)

## 2017-08-25 MED ORDER — METOPROLOL TARTRATE 50 MG PO TABS
50.0000 mg | ORAL_TABLET | Freq: Once | ORAL | Status: AC
  Administered 2017-08-25: 50 mg via ORAL
  Filled 2017-08-25: qty 1

## 2017-08-25 NOTE — ED Triage Notes (Signed)
Pt reports one episode of chest pain today and reports her bp was elevated.  Pt went to urgent care because she was having some visual changes in r eye.  Reports she had same symptoms in the past when her bp was elevated.  Pt presently denies any cp or visual changes.  EMS reports pt went to urgent care in [facility] and bp was 224/90 and hr 64.  Pt alert and oriented.

## 2017-08-25 NOTE — Discharge Instructions (Signed)
Recommend starting a daily log of your blood pressure.  Take your medications as directed.  Make an appointment to follow-up with your cardiologist who manages her blood pressure.  Return for development of severe headache persistent chest pain shortness of breath or any strokelike symptoms.

## 2017-08-25 NOTE — ED Notes (Signed)
Pt states understanding of care given and follow up instructions.  Pt a.o, ambulated from ed with a steady gait

## 2017-08-25 NOTE — ED Provider Notes (Signed)
[facility] EMERGENCY DEPARTMENT Provider Note   CSN: [id number] Arrival date & time: 08/25/17  1740     History   Chief Complaint Chief Complaint  Patient presents with  . Hypertension  . Chest Pain    HPI Valerie [name] is a 83 y.o. female.  Patient sent in from urgent care.  Patient was sent for high blood pressure.  Blood pressures were like of systolic 742.  Patient states she had had her morning blood pressure meds.  In route to here patient had a brief episode of chest pain left chest to the left arm lasted 1 minute.  Has not reoccurred.  Patient denied any neuro focal deficits or severe headache or shortness of breath.  Patient states that her cardiologist handles her blood pressure.  Patient without any complaints currently.  Blood pressure with some improvement by EMS.  It was systolic 595.      Past Medical History:  Diagnosis Date  . Anxiety   . Arthritis   . Broken ribs   . Carotid artery disease ([place])   . Cataract   . Collar bone fracture    Right  . Depression   . Diverticulosis of colon with hemorrhage   . Duodenitis   . Esophageal reflux   . Esophageal stricture   . Essential hypertension   . Glaucoma   . Hiatal hernia   . History of kidney stones     x1   . Hyperlipidemia   . IBS (irritable bowel syndrome)   . Palpitations   . UTI (urinary tract infection) March 2015    Patient Active Problem List   Diagnosis Date Noted  . PVC (premature ventricular contraction) 11/26/2016  . Hyperlipidemia 11/26/2016  . Dislocated IOL (intraocular lens), posterior, right 10/20/2016  . Posterior dislocation of iol (intraocular lens), right eye 10/20/2016  . Hypertensive urgency 06/30/2016  . History of palpitations 06/30/2016  . Blurry vision, bilateral   . Glaucoma 05/25/2016  . Thyroid nodule 05/20/2015  . Carotid stenosis, non-symptomatic 09/14/2012  . Internal and external hemorrhoids without complication 63/87/5643  . Vitamin B12 deficiency  09/15/2011  . Benign hypertensive heart disease without heart failure 05/22/2011  . Pure hypercholesterolemia 05/22/2011  . Stricture esophagus 05/18/2011  . Stricture of duodenum 05/18/2011  . Iron deficiency anemia 05/15/2011  . DIVERTICULOSIS-COLON 02/03/2008    Past Surgical History:  Procedure Laterality Date  . CHOLECYSTECTOMY    . COLONOSCOPY    . DILATION AND CURETTAGE OF UTERUS    . EYE SURGERY Bilateral    total of 5  . LASER PHOTO ABLATION Right 10/20/2016   Procedure: LASER PHOTO ABLATION;  Surgeon: Hayden Pedro, MD;  Location: [place];  Service: Ophthalmology;  Laterality: Right;  Headscope laser  . LEFT ROTATOR CUFF REPAIR X2 Left   . PARS PLANA VITRECTOMY  10/20/2016   WITH 25G REMOVAL/SUTURE SECONDARY INTRAOCULAR LENS, GAS FLUID EXCHANGE   . PARS PLANA VITRECTOMY Right 10/20/2016   Procedure: PARS PLANA VITRECTOMY WITH 25G REMOVAL/SUTURE SECONDARY INTRAOCULAR LENS, GAS FLUID EXCHANGE;  Surgeon: Hayden Pedro, MD;  Location: [place];  Service: Ophthalmology;  Laterality: Right;  . RIGHT ROTATOR CUFF REPAIR X1 Right   . UPPER GASTROINTESTINAL ENDOSCOPY     with dilation    OB History    No data available       Home Medications    Prior to Admission medications   Medication Sig Start Date End Date Taking? Authorizing Provider  aspirin 81 MG tablet  Take 81 mg by mouth daily.     Yes [provider]  hydrochlorothiazide (HYDRODIURIL) 25 MG tablet Take 1 tablet (25 mg total) by mouth daily. 12/02/16  Yes Nahser, Wonda Cheng, MD  losartan (COZAAR) 25 MG tablet Take 1 Tablet by mouth once daily 04/28/17  Yes Nahser, Wonda Cheng, MD  metoprolol (LOPRESSOR) 50 MG tablet Take 1 tablet (50 mg total) by mouth 2 (two) times daily. 01/29/17  Yes Nahser, Wonda Cheng, MD  nitroGLYCERIN (NITROSTAT) 0.4 MG SL tablet Place 1 tablet (0.4 mg total) under the tongue every 5 (five) minutes as needed for chest pain. 07/06/16  Yes Isaiah Serge, NP  pravastatin (PRAVACHOL) 10 MG tablet  Take 1 tablet (10 mg total) by mouth daily. 12/03/16  Yes Nahser, Wonda Cheng, MD  Vitamin D, Ergocalciferol, (DRISDOL) 50000 units CAPS capsule TAKE ONE CAPSULE BY MOUTH EVERY SEVEN DAYS 12/21/16  Yes [provider]  [facility] injection For administration pursuant to protocol 01/11/17   [provider]    Family History Family History  Problem Relation Age of Onset  . Leukemia Mother   . Colon cancer Father 67  . Kidney cancer Brother   . Bladder Cancer Brother   . Stroke Sister   . Esophageal cancer Neg Hx   . Stomach cancer Neg Hx     Social History Social History   Tobacco Use  . Smoking status: Never Smoker  . Smokeless tobacco: Never Used  Substance Use Topics  . Alcohol use: No  . Drug use: No     Allergies   Aricept [donepezil hcl]; Lisinopril; and Sulfa antibiotics   Review of Systems Review of Systems  Constitutional: Negative for fever.  HENT: Negative for congestion.   Eyes: Negative for visual disturbance.  Respiratory: Negative for shortness of breath.   Cardiovascular: Positive for chest pain.  Gastrointestinal: Negative for abdominal pain and nausea.  Genitourinary: Negative for dysuria.  Musculoskeletal: Negative for back pain.  Skin: Negative for rash.  Neurological: Negative for syncope, weakness, numbness and headaches.  Hematological: Does not bruise/bleed easily.  Psychiatric/Behavioral: Negative for confusion.     Physical Exam Updated Vital Signs BP 109/84   Pulse 92   Resp 16   Ht 1.626 m (5\' 4" )   Wt 51.7 kg (114 lb)   SpO2 97%   BMI 19.57 kg/m   Physical Exam  Constitutional: She is oriented to person, place, and time. She appears well-developed and well-nourished.  HENT:  Head: Normocephalic and atraumatic.  Eyes: Conjunctivae and EOM are normal. Pupils are equal, round, and reactive to light.  Neck: Normal range of motion. Neck supple.  Cardiovascular: Normal rate and regular rhythm.  Pulmonary/Chest: Effort  normal and breath sounds normal.  Abdominal: Soft. Bowel sounds are normal. There is no tenderness.  Musculoskeletal: Normal range of motion.  Neurological: She is alert and oriented to person, place, and time. No cranial nerve deficit or sensory deficit. She exhibits normal muscle tone. Coordination normal.  Skin: Skin is warm.  Nursing note and vitals reviewed.    ED Treatments / Results  Labs (all labs ordered are listed, but only abnormal results are displayed) Labs Reviewed  CBC WITH DIFFERENTIAL/PLATELET  BASIC METABOLIC PANEL    EKG  EKG Interpretation  Date/Time:  Wednesday August 25 2017 17:44:00 EST Ventricular Rate:  64 PR Interval:    QRS Duration: 100 QT Interval:  476 QTC Calculation: 492 R Axis:   60 Text Interpretation:  Sinus rhythm Abnormal R-wave  progression, early transition Borderline prolonged QT interval Artifact Confirmed by Fredia Sorrow [phone]) on 08/25/2017 6:01:03 PM       Radiology Dg Chest 2 View  Result Date: 08/25/2017 CLINICAL DATA:  Chest pain EXAM: CHEST  2 VIEW COMPARISON:  07/01/2016, 03/05/ 2010 FINDINGS: No focal pulmonary infiltrate or effusion. Stable cardiomediastinal silhouette with borderline heart size and aortic atherosclerosis. No pneumothorax. Bilateral shoulder replacements. Stable mild compression at approximate T12 level. IMPRESSION: No active cardiopulmonary disease. Electronically Signed   By: Donavan Foil M.D.   On: 08/25/2017 19:03    Procedures Procedures (including critical care time)  Medications Ordered in ED Medications  metoprolol tartrate (LOPRESSOR) tablet 50 mg (50 mg Oral Given 08/25/17 1836)     Initial Impression / Assessment and Plan / ED Course  I have reviewed the triage vital signs and the nursing notes.  Pertinent labs & imaging results that were available during my care of the patient were reviewed by me and considered in my medical decision making (see chart for details).    Patient's  blood pressures here with some slight improvement.  Patient was given her evening dose of Lopressor.  Systolics came down to around 071 diastolics 93 or lower.  Patient without any focal neuro deficits or complicating factors from the blood pressure.  Feel that would be better to get gradual control through her providers.  Patient will go home and keep a log of her blood pressure.  And contact her cardiologist.  She will return for any chest pain severe headache trouble breathing or any strokelike symptoms.  Not clear how long blood pressure has been elevated because patient has not been checking her blood pressure on a regular basis.   Final Clinical Impressions(s) / ED Diagnoses   Final diagnoses:  Essential hypertension    ED Discharge Orders    None       Fredia Sorrow, MD 08/27/17 1705

## 2017-08-30 ENCOUNTER — Ambulatory Visit: Payer: Medicare Other | Admitting: Cardiovascular Disease

## 2017-08-30 ENCOUNTER — Encounter: Payer: Self-pay | Admitting: Cardiovascular Disease

## 2017-08-30 VITALS — BP 120/70 | HR 55 | Ht 60.0 in | Wt 114.4 lb

## 2017-08-30 DIAGNOSIS — I10 Essential (primary) hypertension: Secondary | ICD-10-CM | POA: Diagnosis not present

## 2017-08-30 DIAGNOSIS — E782 Mixed hyperlipidemia: Secondary | ICD-10-CM | POA: Diagnosis not present

## 2017-08-30 NOTE — Progress Notes (Signed)
Cardiology Office Note   Date:  08/30/2017   ID:  Valerie [name], DOB Feb 01, 1928, MRN [id number]  PCP:  Eustaquio Maize, MD  Cardiologist:  Dr. Acie Fredrickson    Chief Complaint  Patient presents with  . Hypertension  . Hyperlipidemia     Please note pt was followed by Dr. Mare Ferrari and with his retirement was seen once by Dr. Domenic Polite in [place], but her daughter who is followed by Dr. Acie Fredrickson would like her seen by Dr. Acie Fredrickson.  He has agreed to see pt.  Previous notes from [place]:  Valerie [name] is a 83 y.o. female who presents for HTN, bradycardia and dizziness.  PMH of HTN, HLD, anxiety, depression, GERD, CAD, glaucoma-underwent intervention approximately 3 months ago, lives alone but has family living next door who provide close supervision, presented to [facility] ED on 06/30/16 with complaints of ongoing blurred vision for several weeks post procedure,? Intermittent dyspnea and arm pain. Along with chest pain that she states is arthritic.  She checks her blood pressures regularly at home and apparently were elevated in the 220/95 range despite compliance with medications. She apparently has not felt well for a couple of days, headache, poor oral intake and visual changes since surgery for which she has recently been seen by her ophthalmologist and has follow-up in 2 months. In the ED, CT head without acute abnormalities, initial blood pressure 220/77. Admitted for hypertensive urgency.  Her troponin was negative.  Hx of neg nuc study in 2015.    She had HR to 46 (though looking back she has been slow before)..  Meds adjusted and plan for outpt echo.  She had complained about SOB but was not clear today she does have at times.   Was with hypokalemia and follow up was with K+ 3.3.    Hx of carotid disease with last check in 09/2015 with 1-39% bil stenosis. To be seen by vascular in Dec.   On her last visit she complained of dizziness.  Her HR was low so I stopped cardizem and added amlodipine.  Also  on 48 hour holter she was in SR 56 to 60 with occ episodes of sinus brady brief to 42 and occ PACs and PVCs.  Her echo with EF of 55 to 60% G2DD, mild Aortic regurg. LA mildly dialted. PA pk pressure 37 mmHg.   Today she feels better with less palpitations and less dizziness.  She does complain of hair loss.  This began with amlodipine.  She had hair loss with lisinopril as well.  No chest pain and no SOB.    November 26, 2016:  Valerie [name] is seen for the first time today.  Seen with [name] ( sister)  She is a transfer from Dr. Mare Ferrari. She has a history of hypertension and bradycardia.  BP has been up and down.  She takes it at home  BP has been as high as 185 and as low as 125 Goes out to eat once a week .   Does not pay attention to the salt in her food.   Tries to avoid fried foods.    Has rare episodes of palpitations . Perhaps twice a month. Worse when she is lying down  Had a 48 hr monitor  - revealed PACS and PVCs   August 30, 2017:  Seen with Son,  Claiborne Billings  Hx of HTN , blood pressure is high today.  She makes no effort to avoid salt. No cardiac  issues [place] , broke some ribs  Was in the hospital recently for HTN , ( felt some eye pressure )  Still eating some canned foods.   Past Medical History:  Diagnosis Date  . Anxiety   . Arthritis   . Broken ribs   . Carotid artery disease ([place])   . Cataract   . Collar bone fracture    Right  . Depression   . Diverticulosis of colon with hemorrhage   . Duodenitis   . Esophageal reflux   . Esophageal stricture   . Essential hypertension   . Glaucoma   . Hiatal hernia   . History of kidney stones     x1   . Hyperlipidemia   . IBS (irritable bowel syndrome)   . Palpitations   . UTI (urinary tract infection) March 2015    Past Surgical History:  Procedure Laterality Date  . CHOLECYSTECTOMY    . COLONOSCOPY    . DILATION AND CURETTAGE OF UTERUS    . EYE SURGERY Bilateral    total of 5  . LASER PHOTO ABLATION Right  10/20/2016   Procedure: LASER PHOTO ABLATION;  Surgeon: Hayden Pedro, MD;  Location: [place];  Service: Ophthalmology;  Laterality: Right;  Headscope laser  . LEFT ROTATOR CUFF REPAIR X2 Left   . PARS PLANA VITRECTOMY  10/20/2016   WITH 25G REMOVAL/SUTURE SECONDARY INTRAOCULAR LENS, GAS FLUID EXCHANGE   . PARS PLANA VITRECTOMY Right 10/20/2016   Procedure: PARS PLANA VITRECTOMY WITH 25G REMOVAL/SUTURE SECONDARY INTRAOCULAR LENS, GAS FLUID EXCHANGE;  Surgeon: Hayden Pedro, MD;  Location: [place];  Service: Ophthalmology;  Laterality: Right;  . RIGHT ROTATOR CUFF REPAIR X1 Right   . UPPER GASTROINTESTINAL ENDOSCOPY     with dilation     Current Outpatient Medications  Medication Sig Dispense Refill  . aspirin 81 MG tablet Take 81 mg by mouth daily.      . hydrochlorothiazide (HYDRODIURIL) 25 MG tablet Take 1 tablet (25 mg total) by mouth daily. 90 tablet 2  . losartan (COZAAR) 25 MG tablet Take 1 Tablet by mouth once daily 90 tablet 2  . metoprolol (LOPRESSOR) 50 MG tablet Take 1 tablet (50 mg total) by mouth 2 (two) times daily. 60 tablet 9  . nitroGLYCERIN (NITROSTAT) 0.4 MG SL tablet Place 1 tablet (0.4 mg total) under the tongue every 5 (five) minutes as needed for chest pain. 25 tablet 3  . pravastatin (PRAVACHOL) 10 MG tablet Take 1 tablet (10 mg total) by mouth daily. 90 tablet 3  . SHINGRIX injection For administration pursuant to protocol  1  . Vitamin D, Ergocalciferol, (DRISDOL) 50000 units CAPS capsule TAKE ONE CAPSULE BY MOUTH EVERY SEVEN DAYS  3   No current facility-administered medications for this visit.     Allergies:   Aricept [donepezil hcl]; Lisinopril; Sulfa antibiotics; and Sulfasalazine    Social History:  The patient  reports that  has never smoked. she has never used smokeless tobacco. She reports that she does not drink alcohol or use drugs.   Family History:  The patient's family history includes Bladder Cancer in her brother; Colon cancer (age of onset: 1)  in her father; Kidney cancer in her brother; Leukemia in her mother; Stroke in her sister.    Review of systems:    As per current history.  Otherwise the review of systems is negative.  Wt Readings from Last 3 Encounters:  08/30/17 114 lb 6.4 oz (51.9 kg)  08/25/17 114  lb (51.7 kg)  05/25/17 116 lb 12.8 oz (53 kg)    Physical Exam: Blood pressure 120/70, pulse (!) 55, height 5' (1.524 m), weight 114 lb 6.4 oz (51.9 kg), SpO2 97 %.  GEN:  Well nourished, well developed in no acute distress HEENT: Normal NECK: No JVD;  Left carotid bruit  LYMPHATICS: No lymphadenopathy CARDIAC: RR, no murmurs, rubs, gallops RESPIRATORY:  Clear to auscultation without rales, wheezing or rhonchi  ABDOMEN: Soft, non-tender, non-distended MUSCULOSKELETAL:  No edema; No deformity  SKIN: Warm and dry NEUROLOGIC:  Alert and oriented x 3  EKG:     Recent Labs: 05/25/2017: ALT 13 08/25/2017: BUN 19; Creatinine, Ser 0.72; Hemoglobin 13.0; Platelets 350; Potassium 3.8; Sodium 141   Lipid Panel    Component Value Date/Time   CHOL 177 11/26/2016 1104   TRIG 98 11/26/2016 1104   HDL 56 11/26/2016 1104   CHOLHDL 3.2 11/26/2016 1104   CHOLHDL 4 12/05/2014 0946   VLDL 26.0 12/05/2014 0946   LDLCALC 101 (H) 11/26/2016 1104     Other studies Reviewed: Additional studies/ records that were reviewed today include:  Echo 07/31/16 Study Conclusions  - Left ventricle: The cavity size was normal. There was mild focal   basal hypertrophy of the septum. Systolic function was normal.   The estimated ejection fraction was in the range of 55% to 60%.   Wall motion was normal; there were no regional wall motion   abnormalities. Features are consistent with a pseudonormal left   ventricular filling pattern, with concomitant abnormal relaxation   and increased filling pressure (grade 2 diastolic dysfunction). - Aortic valve: There was mild regurgitation. - Mitral valve: Calcified annulus. There was mild  regurgitation. - Left atrium: The atrium was mildly dilated. - Pulmonary arteries: Systolic pressure was mildly increased. PA   peak pressure: 37 mm Hg (S).   ASSESSMENT AND PLAN:  1.  HTN:   Blood pressure is well controlled after a several minute delay .  Continue current medications.  2. Chronic hx of palpatitations  And bradycardia -   She has a history of PACs and PVCs on monitor.  3. Aortic Insufficiency :  Stable   4. Hyperlipidemia:  She is on Pravachol 10 mg a day.  4. Carotid disease:   Followed by vascular surgery   Current medicines are reviewed with the patient today.  The patient Has no concerns regarding medicines.  The following changes have been made:  See above Labs/ tests ordered today include:see above  Disposition:   FU:  I'll see her again in 6 months.  Signed, Mertie Moores, MD  08/30/2017 10:03 AM    [facility] [place], [place] [place], [place] Phone: [phone]; Fax: [phone]

## 2017-08-30 NOTE — Patient Instructions (Signed)

## 2017-09-11 ENCOUNTER — Other Ambulatory Visit: Payer: Self-pay | Admitting: Family Medicine

## 2017-09-13 NOTE — Telephone Encounter (Signed)
Aware , can take over the counter vitamin D.

## 2017-09-13 NOTE — Telephone Encounter (Signed)
Pt should take daily OTC vitamin D, not Rx strength anymore, will recheck Vit D levels next visit.

## 2017-10-20 ENCOUNTER — Encounter (HOSPITAL_COMMUNITY): Payer: Medicare Other

## 2017-10-20 ENCOUNTER — Ambulatory Visit: Payer: Medicare Other | Admitting: Family

## 2017-10-21 ENCOUNTER — Encounter: Payer: Self-pay | Admitting: Family

## 2017-10-21 ENCOUNTER — Ambulatory Visit (HOSPITAL_COMMUNITY)
Admission: RE | Admit: 2017-10-21 | Discharge: 2017-10-21 | Disposition: A | Discharge status: Home / Self Care | Payer: Medicare Other | Source: Ambulatory Visit | Attending: Family | Admitting: Family

## 2017-10-21 ENCOUNTER — Ambulatory Visit (INDEPENDENT_AMBULATORY_CARE_PROVIDER_SITE_OTHER): Payer: Medicare Other | Admitting: Family

## 2017-10-21 VITALS — BP 158/59 | HR 55 | Resp 18 | Ht 60.0 in | Wt 109.5 lb

## 2017-10-21 DIAGNOSIS — I6523 Occlusion and stenosis of bilateral carotid arteries: Secondary | ICD-10-CM | POA: Insufficient documentation

## 2017-10-21 LAB — VAS US CAROTID
LCCADDIAS: 13 cm/s
LCCADSYS: 82 cm/s
LEFT ECA DIAS: -12 cm/s
LEFT VERTEBRAL DIAS: -9 cm/s
LICADDIAS: -11 cm/s
LICADSYS: -41 cm/s
Left CCA prox dias: 18 cm/s
Left CCA prox sys: 84 cm/s
Left ICA prox dias: -35 cm/s
Left ICA prox sys: -149 cm/s
RCCADSYS: -59 cm/s
RIGHT CCA MID DIAS: 14 cm/s
RIGHT ECA DIAS: -8 cm/s
RIGHT VERTEBRAL DIAS: 7 cm/s
Right CCA prox dias: 10 cm/s
Right CCA prox sys: 65 cm/s

## 2017-10-21 NOTE — Patient Instructions (Signed)

## 2017-10-21 NOTE — Progress Notes (Signed)
Chief Complaint: Follow up Extracranial Carotid Artery Stenosis   History of Present Illness  Valerie [name] is a 84 y.o. female whom Dr. Scot Dock has been monitoring with mild carotid disease bilaterally. She returns today for follow up.  Patient has not had previous carotid artery intervention.  She has no history of TIA or stroke symptoms.Specifically the patient denies a history of amaurosis fugax or monocular blindness, unilateral facial drooping, hemiparesis, or receptive or expressive aphasia.   She denies any claudication symptoms with walking, denies non healing wounds. She is not sedentary, she stays active.   She states her blood pressure at home runs about 150/70.   She reports developing left upper eyelid ptosis from several left eye surgeries for glaucoma and cataract.      Pt Diabetic: No Pt smoker: non-smoker  Pt meds include: Statin : Yes ASA: Yes Other anticoagulants/antiplatelets: no   Past Medical History:  Diagnosis Date  . Anxiety   . Arthritis   . Broken ribs   . Carotid artery disease ([place])   . Cataract   . Collar bone fracture    Right  . Depression   . Diverticulosis of colon with hemorrhage   . Duodenitis   . Esophageal reflux   . Esophageal stricture   . Essential hypertension   . Glaucoma   . Hiatal hernia   . History of kidney stones     x1   . Hyperlipidemia   . IBS (irritable bowel syndrome)   . Palpitations   . UTI (urinary tract infection) March 2015    Social History Social History   Tobacco Use  . Smoking status: Never Smoker  . Smokeless tobacco: Never Used  Substance Use Topics  . Alcohol use: No  . Drug use: No    Family History Family History  Problem Relation Age of Onset  . Leukemia Mother   . Colon cancer Father 53  . Kidney cancer Brother   . Bladder Cancer Brother   . Stroke Sister   . Esophageal cancer Neg Hx   . Stomach cancer Neg Hx     Surgical History Past Surgical History:   Procedure Laterality Date  . CHOLECYSTECTOMY    . COLONOSCOPY    . DILATION AND CURETTAGE OF UTERUS    . EYE SURGERY Bilateral    total of 5  . LASER PHOTO ABLATION Right 10/20/2016   Procedure: LASER PHOTO ABLATION;  Surgeon: Hayden Pedro, MD;  Location: [facility];  Service: Ophthalmology;  Laterality: Right;  Headscope laser  . LEFT ROTATOR CUFF REPAIR X2 Left   . PARS PLANA VITRECTOMY  10/20/2016   WITH 25G REMOVAL/SUTURE SECONDARY INTRAOCULAR LENS, GAS FLUID EXCHANGE   . PARS PLANA VITRECTOMY Right 10/20/2016   Procedure: PARS PLANA VITRECTOMY WITH 25G REMOVAL/SUTURE SECONDARY INTRAOCULAR LENS, GAS FLUID EXCHANGE;  Surgeon: Hayden Pedro, MD;  Location: [facility];  Service: Ophthalmology;  Laterality: Right;  . RIGHT ROTATOR CUFF REPAIR X1 Right   . UPPER GASTROINTESTINAL ENDOSCOPY     with dilation    Allergies  Allergen Reactions  . Aricept [Donepezil Hcl] Other (See Comments)    Nightmares, near syncope, weak, decreased appetite.  . Lisinopril Other (See Comments)    Hair loss  . Sulfa Antibiotics Nausea Only  . Sulfasalazine Nausea Only    Current Outpatient Medications  Medication Sig Dispense Refill  . aspirin 81 MG tablet Take 81 mg by mouth daily.      . hydrochlorothiazide (HYDRODIURIL)  25 MG tablet Take 1 tablet (25 mg total) by mouth daily. 90 tablet 2  . losartan (COZAAR) 25 MG tablet Take 1 Tablet by mouth once daily 90 tablet 2  . metoprolol (LOPRESSOR) 50 MG tablet Take 1 tablet (50 mg total) by mouth 2 (two) times daily. 60 tablet 9  . pravastatin (PRAVACHOL) 10 MG tablet Take 1 tablet (10 mg total) by mouth daily. 90 tablet 3  . SHINGRIX injection For administration pursuant to protocol  1  . Vitamin D, Ergocalciferol, (DRISDOL) 50000 units CAPS capsule TAKE ONE CAPSULE BY MOUTH EVERY SEVEN DAYS  3  . nitroGLYCERIN (NITROSTAT) 0.4 MG SL tablet Place 1 tablet (0.4 mg total) under the tongue every 5 (five) minutes as needed for chest pain. (Patient not taking:  Reported on 10/21/2017) 25 tablet 3   No current facility-administered medications for this visit.     Review of Systems : See HPI for pertinent positives and negatives.  Physical Examination  Vitals:   10/21/17 1425 10/21/17 1428  BP: (!) 180/72 (!) 158/59  Pulse: (!) 55   Resp: 18   SpO2: 98%   Weight: 109 lb 8 oz (49.7 kg)   Height: 5' (1.524 m)    Body mass index is 21.39 kg/m.  General: WDWN female in NAD GAIT: normal HENT: No gross abnormalities. Eyes: PERRLA. Left upper eyelid ptosis Pulmonary: Respirations are non-labored, CTAB, no rales, rhonchi, or wheezing.  Cardiac: regular rhythm,no detected murmur.  VASCULAR EXAM Carotid Bruits Left Right   Positive, prominent  Positive, moderate   Abdominal aortic pulse is not palpable. Radial pulses are 2+ palpable and equal.      LE Pulses LEFT RIGHT   POPLITEAL not palpable  not palpable   POSTERIOR TIBIAL 2+ palpable  2+ palpable    DORSALIS PEDIS  ANTERIOR TIBIAL not palpable  not palpable     Gastrointestinal: soft, nontender, BS WNL, no r/g, no paplable masses.  Musculoskeletal: No muscle atrophy/wasting. M/S 5/5 throughout, Extremities without ischemic changes, bunions.  Neurologic: A&O X 3; Appropriate Affect, Speech is normal CN 2-12 intact, Pain and light touch intact in extremities, Motor exam as listed above.  Psychiatric: Normal thought content, mood appropriate to clinical situation.    Assessment: KAYRA CROWELL is a 84 y.o. female whowho has no history of stroke or TIA.   Fortunately she does not have DM, has never used tobacco, does not use ETOH, and is not sedentary.  She takes ASA and a statin on a regular basis.   DATA  Carotid Duplex (10/21/17):  1-39% bilateral proximal ICA stenosis. Bilateral vertebral  artery flow is antegrade.  Bilateral subclavian artery waveforms are normal.  No significant changed compared to exams of 03-14-14 and 10-17-15.  Plan: Follow-up in 2 years with Carotid Duplex scan.    I discussed in depth with the patient the nature of atherosclerosis, and emphasized the importance of maximal medical management including strict control of blood pressure, blood glucose, and lipid levels, obtaining regular exercise, and continued cessation of smoking.  The patient is aware that without maximal medical management the underlying atherosclerotic disease process will progress, limiting the benefit of any interventions. The patient was given information about stroke prevention and what symptoms should prompt the patient to seek immediate medical care. Thank you for allowing [place] to participate in this patient's care.  Clemon Chambers, RN, MSN, FNP-C [facility] Office: [phone]  Clinic Physician: Oneida Alar  10/21/17 2:30 PM

## 2017-11-22 ENCOUNTER — Telehealth: Payer: Medicare Other | Admitting: Family

## 2017-11-22 DIAGNOSIS — N390 Urinary tract infection, site not specified: Secondary | ICD-10-CM

## 2017-11-22 MED ORDER — CEPHALEXIN 500 MG PO CAPS: 500.0000 mg | ORAL_CAPSULE | Freq: Two times a day (BID) | ORAL | 0 refills | Status: DC

## 2017-11-22 NOTE — Progress Notes (Signed)

## 2017-12-06 ENCOUNTER — Other Ambulatory Visit: Payer: Self-pay | Admitting: Cardiovascular Disease

## 2017-12-06 ENCOUNTER — Encounter: Payer: Self-pay | Admitting: Cardiovascular Disease

## 2017-12-06 MED ORDER — HYDROCHLOROTHIAZIDE 25 MG PO TABS: 25.0000 mg | ORAL_TABLET | Freq: Every day | ORAL | 2 refills | Status: DC

## 2017-12-06 NOTE — Telephone Encounter (Signed)
Pt's medication was sent to pt's pharmacy as requested. Confirmation received.  °

## 2017-12-22 ENCOUNTER — Other Ambulatory Visit: Payer: Self-pay | Admitting: Cardiovascular Disease

## 2017-12-22 MED ORDER — METOPROLOL TARTRATE 50 MG PO TABS
50.0000 mg | ORAL_TABLET | Freq: Two times a day (BID) | ORAL | 2 refills | Status: DC
Start: 2017-12-22 — End: 2018-03-09

## 2018-02-24 ENCOUNTER — Telehealth: Payer: Self-pay | Admitting: Cardiovascular Disease

## 2018-02-24 DIAGNOSIS — I160 Hypertensive urgency: Secondary | ICD-10-CM

## 2018-02-24 DIAGNOSIS — I119 Hypertensive heart disease without heart failure: Secondary | ICD-10-CM

## 2018-02-24 MED ORDER — LOSARTAN POTASSIUM 25 MG PO TABS: 25.0000 mg | ORAL_TABLET | Freq: Every day | ORAL | 0 refills | Status: DC

## 2018-02-24 NOTE — Telephone Encounter (Signed)
Daughter calling back into triage and states that she was mistaken that the patient is actually taking the losartan 25 mg QD. Made daughter aware that I would update Dr. Acie Fredrickson and that she will be contacted with recommendations. She verbalized understanding.

## 2018-02-24 NOTE — Telephone Encounter (Signed)
Spoke with Sherrie, patient's daughter who states she is currently at work and her mother is at home but she rechecked patient's BP a little while ago and it is 165/67 mmHg. Sherrie states the patient has had a very stressful week with family issues and the death of 2 close neighbors. She states she is unsure of how much sodium the patient has been eating but admits that patient says food taste too bland without salt. I reviewed some healthy cooking habits with Sherrie and advised her to encourage her mother to reduce salt intake and continue to monitor BP. I advised Sherrie that if BP remains elevated despite the reduction in salt intake, to call back prior to appointment on June 12 with Dr. Acie Fredrickson. Sherrie verbalized understanding and agreement and thanked me for the call.

## 2018-02-24 NOTE — Telephone Encounter (Signed)
New message    Pt c/o BP issue: STAT if pt c/o blurred vision, one-sided weakness or slurred speech  1. What are your last 5 BP readings? 207/94 this morning. 174/74-yesterday morning, 194/93-yesterday  2. Are you having any other symptoms (ex. Dizziness, headache, blurred vision, passed out)? Pt daughter states pt says she has a headache, feeling funny. Pt daughter states that pt said her vision is different but she also glacoma  3. What is your BP issue? Pt BP has been high

## 2018-02-24 NOTE — Telephone Encounter (Signed)
Blood pressure was normal when I saw her in the office at her last office visit.  I suspect that she has been eating too much salt.  Have her continue to monitor her blood pressure readings.  If her blood pressure readings remain elevated and she is eating low-salt, we will increase the losartan to 50 mg a day.  We will check a basic metabolic profile in 3 weeks.

## 2018-02-24 NOTE — Telephone Encounter (Signed)
Patient's daughter calling and states that the patient's BP was elevated yesterday at 180/77 and 197/93. She states that this morning it was 207/94. She states that the patient has had a HA for the past several days. She states that the patient has glaucoma so she already has problems with her vision, but she finds it harder to read when her BP is elevated. She states that the patient is not having any changes in her speech or weakness on one side. She states that the patient is not having any chest pain, SOB, or any other symptoms. She states that the patient is taking HCTZ 25 mg QD and metoprolol 50 mg BID. She states that the patient has not been taking the losartan 25 mg QD as prescribed because she has ran out. She states that the patient does not eat that much but when she does eat it is usually foods that are higher in sodium. Made daughter aware that I would send in a refill for the losartan 25 mg QD to her preferred pharmacy and that the patient will need to avoid salt in her diet. Patient has an appointment on 6/12. Made daughter aware that I would forward to Dr. Elmarie Shiley RN and if there were additional recommendations she would be contacted. She verbalized understanding and thanked me for the call.

## 2018-03-09 ENCOUNTER — Other Ambulatory Visit: Payer: Medicare Other

## 2018-03-09 ENCOUNTER — Encounter: Payer: Self-pay | Admitting: Cardiovascular Disease

## 2018-03-09 ENCOUNTER — Ambulatory Visit (INDEPENDENT_AMBULATORY_CARE_PROVIDER_SITE_OTHER): Payer: Medicare Other | Admitting: Cardiovascular Disease

## 2018-03-09 VITALS — BP 172/68 | HR 44 | Ht 64.0 in | Wt 110.0 lb

## 2018-03-09 DIAGNOSIS — I10 Essential (primary) hypertension: Secondary | ICD-10-CM | POA: Diagnosis not present

## 2018-03-09 DIAGNOSIS — E782 Mixed hyperlipidemia: Secondary | ICD-10-CM | POA: Diagnosis not present

## 2018-03-09 DIAGNOSIS — R001 Bradycardia, unspecified: Secondary | ICD-10-CM | POA: Diagnosis not present

## 2018-03-09 LAB — HEPATIC FUNCTION PANEL
ALBUMIN: 4.6 g/dL (ref 3.5–4.7)
ALT: 12 IU/L (ref 0–32)
AST: 21 IU/L (ref 0–40)
Alkaline Phosphatase: 79 IU/L (ref 39–117)
Bilirubin Total: 0.4 mg/dL (ref 0.0–1.2)
Bilirubin, Direct: 0.11 mg/dL (ref 0.00–0.40)
Total Protein: 7.1 g/dL (ref 6.0–8.5)

## 2018-03-09 LAB — BASIC METABOLIC PANEL
BUN/Creatinine Ratio: 34 — ABNORMAL HIGH (ref 12–28)
BUN: 30 mg/dL — ABNORMAL HIGH (ref 8–27)
CO2: 22 mmol/L (ref 20–29)
Calcium: 10.2 mg/dL (ref 8.7–10.3)
Chloride: 101 mmol/L (ref 96–106)
Creatinine, Ser: 0.87 mg/dL (ref 0.57–1.00)
GFR calc Af Amer: 69 mL/min/{1.73_m2} (ref >59)
GFR, EST NON AFRICAN AMERICAN: 60 mL/min/{1.73_m2} (ref >59)
Glucose: 72 mg/dL (ref 65–99)
POTASSIUM: 4.5 mmol/L (ref 3.5–5.2)
SODIUM: 139 mmol/L (ref 134–144)

## 2018-03-09 LAB — LIPID PANEL
Chol/HDL Ratio: 3.6 ratio (ref 0.0–4.4)
Cholesterol, Total: 186 mg/dL (ref 100–199)
HDL: 51 mg/dL
LDL Calculated: 104 mg/dL — ABNORMAL HIGH (ref 0–99)
Triglycerides: 156 mg/dL — ABNORMAL HIGH (ref 0–149)
VLDL Cholesterol Cal: 31 mg/dL (ref 5–40)

## 2018-03-09 LAB — TSH: TSH: 1.5 u[IU]/mL (ref 0.450–4.500)

## 2018-03-09 MED ORDER — AMLODIPINE BESYLATE 5 MG PO TABS: 5.0000 mg | ORAL_TABLET | Freq: Every day | ORAL | 3 refills | Status: DC

## 2018-03-09 MED ORDER — METOPROLOL TARTRATE 25 MG PO TABS: 25.0000 mg | ORAL_TABLET | Freq: Two times a day (BID) | ORAL | 3 refills | Status: DC

## 2018-03-09 NOTE — Progress Notes (Signed)
Cardiology Office Note   Date:  03/09/2018   ID:  [name], DOB 07/01/1928, MRN [id number]  PCP:  Eustaquio Maize, MD  Cardiologist:  Dr. Acie Fredrickson    Chief Complaint  Patient presents with  . Hypertension     Please note pt was followed by Dr. Mare Ferrari and with his retirement was seen once by Dr. Domenic Polite in [place], but her daughter who is followed by Dr. Acie Fredrickson would like her seen by Dr. Acie Fredrickson.  He has agreed to see pt.  Previous notes from [place]:  BREIANA [name] is a 84 y.o. female who presents for HTN, bradycardia and dizziness.  PMH of HTN, HLD, anxiety, depression, GERD, CAD, glaucoma-underwent intervention approximately 3 months ago, lives alone but has family living next door who provide close supervision, presented to [facility] ED on 06/30/16 with complaints of ongoing blurred vision for several weeks post procedure,? Intermittent dyspnea and arm pain. Along with chest pain that she states is arthritic.  She checks her blood pressures regularly at home and apparently were elevated in the 220/95 range despite compliance with medications. She apparently has not felt well for a couple of days, headache, poor oral intake and visual changes since surgery for which she has recently been seen by her ophthalmologist and has follow-up in 2 months. In the ED, CT head without acute abnormalities, initial blood pressure 220/77. Admitted for hypertensive urgency.  Her troponin was negative.  Hx of neg nuc study in 2015.    She had HR to 46 (though looking back she has been slow before)..  Meds adjusted and plan for outpt echo.  She had complained about SOB but was not clear today she does have at times.   Was with hypokalemia and follow up was with K+ 3.3.    Hx of carotid disease with last check in 09/2015 with 1-39% bil stenosis. To be seen by vascular in Dec.   On her last visit she complained of dizziness.  Her HR was low so I stopped cardizem and added amlodipine.  Also on 48 hour holter  she was in SR 56 to 60 with occ episodes of sinus brady brief to 42 and occ PACs and PVCs.  Her echo with EF of 55 to 60% G2DD, mild Aortic regurg. LA mildly dialted. PA pk pressure 37 mmHg.   Today she feels better with less palpitations and less dizziness.  She does complain of hair loss.  This began with amlodipine.  She had hair loss with lisinopril as well.  No chest pain and no SOB.    November 26, 2016:  Valerie [name] is seen for the first time today.  Seen with [name] ( sister)  She is a transfer from Dr. Mare Ferrari. She has a history of hypertension and bradycardia.  BP has been up and down.  She takes it at home  BP has been as high as 185 and as low as 125 Goes out to eat once a week .   Does not pay attention to the salt in her food.   Tries to avoid fried foods.    Has rare episodes of palpitations . Perhaps twice a month. Worse when she is lying down  Had a 48 hr monitor  - revealed PACS and PVCs   August 30, 2017:  Seen with Son,  Claiborne Billings  Hx of HTN , blood pressure is high today.  She makes no effort to avoid salt. No cardiac issues [place] ,  broke some ribs  Was in the hospital recently for HTN , ( felt some eye pressure )  Still eating some canned foods.   March 09, 2018:  Valerie [name] is seen today for follow-up of her hypertension. Has had some swimmyheadedness - has a cold. Has felt poorly for the past for 3 weeks.  Has had a URI for those 3 weeks No syncope    Past Medical History:  Diagnosis Date  . Anxiety   . Arthritis   . Broken ribs   . Carotid artery disease ([place])   . Cataract   . Collar bone fracture    Right  . Depression   . Diverticulosis of colon with hemorrhage   . Duodenitis   . Esophageal reflux   . Esophageal stricture   . Essential hypertension   . Glaucoma   . Hiatal hernia   . History of kidney stones     x1   . Hyperlipidemia   . IBS (irritable bowel syndrome)   . Palpitations   . UTI (urinary tract infection) March 2015    Past  Surgical History:  Procedure Laterality Date  . CHOLECYSTECTOMY    . COLONOSCOPY    . DILATION AND CURETTAGE OF UTERUS    . EYE SURGERY Bilateral    total of 5  . LASER PHOTO ABLATION Right 10/20/2016   Procedure: LASER PHOTO ABLATION;  Surgeon: Hayden Pedro, MD;  Location: [facility];  Service: Ophthalmology;  Laterality: Right;  Headscope laser  . LEFT ROTATOR CUFF REPAIR X2 Left   . PARS PLANA VITRECTOMY  10/20/2016   WITH 25G REMOVAL/SUTURE SECONDARY INTRAOCULAR LENS, GAS FLUID EXCHANGE   . PARS PLANA VITRECTOMY Right 10/20/2016   Procedure: PARS PLANA VITRECTOMY WITH 25G REMOVAL/SUTURE SECONDARY INTRAOCULAR LENS, GAS FLUID EXCHANGE;  Surgeon: Hayden Pedro, MD;  Location: [facility];  Service: Ophthalmology;  Laterality: Right;  . RIGHT ROTATOR CUFF REPAIR X1 Right   . UPPER GASTROINTESTINAL ENDOSCOPY     with dilation     Current Outpatient Medications  Medication Sig Dispense Refill  . aspirin 81 MG tablet Take 81 mg by mouth daily.      . hydrochlorothiazide (HYDRODIURIL) 25 MG tablet Take 1 tablet (25 mg total) by mouth daily. 90 tablet 2  . losartan (COZAAR) 25 MG tablet Take 1 tablet (25 mg total) by mouth daily. 90 tablet 0  . nitroGLYCERIN (NITROSTAT) 0.4 MG SL tablet Place 1 tablet (0.4 mg total) under the tongue every 5 (five) minutes as needed for chest pain. 25 tablet 3  . pravastatin (PRAVACHOL) 10 MG tablet Take 1 tablet (10 mg total) by mouth daily. 90 tablet 3  . amLODipine (NORVASC) 5 MG tablet Take 1 tablet (5 mg total) by mouth daily. 90 tablet 3  . metoprolol tartrate (LOPRESSOR) 25 MG tablet Take 1 tablet (25 mg total) by mouth 2 (two) times daily. 180 tablet 3   No current facility-administered medications for this visit.     Allergies:   Aricept [donepezil hcl]; Lisinopril; Sulfa antibiotics; and Sulfasalazine    Social History:  The patient  reports that she has never smoked. She has never used smokeless tobacco. She reports that she does not drink alcohol or  use drugs.   Family History:  The patient's family history includes Bladder Cancer in her brother; Colon cancer (age of onset: 43) in her father; Kidney cancer in her brother; Leukemia in her mother; Stroke in her sister.    Review of systems:  As per current history.  Otherwise the review of systems is negative.  Wt Readings from Last 3 Encounters:  03/09/18 110 lb (49.9 kg)  10/21/17 109 lb 8 oz (49.7 kg)  08/30/17 114 lb 6.4 oz (51.9 kg)    Physical Exam:  Physical Exam: Blood pressure (!) 172/68, pulse (!) 44, height 5\' 4"  (1.626 m), weight 110 lb (49.9 kg), SpO2 98 %.  GEN:   Frail, elderly female  HEENT: Normal NECK: No JVD; No carotid bruits LYMPHATICS: No lymphadenopathy CARDIAC: RRR  Bradycardia , soft systolic murmur   RESPIRATORY:  Clear to auscultation without rales, wheezing or rhonchi  ABDOMEN: Soft, non-tender, non-distended MUSCULOSKELETAL:  No edema; No deformity  SKIN: Warm and dry NEUROLOGIC:  Alert and oriented x 3  EKG:   March 09, 2018: Sinus bradycardia at 48.  No ST or T wave changes.  Recent Labs: 05/25/2017: ALT 13 08/25/2017: BUN 19; Creatinine, Ser 0.72; Hemoglobin 13.0; Platelets 350; Potassium 3.8; Sodium 141   Lipid Panel    Component Value Date/Time   CHOL 177 11/26/2016 1104   TRIG 98 11/26/2016 1104   HDL 56 11/26/2016 1104   CHOLHDL 3.2 11/26/2016 1104   CHOLHDL 4 12/05/2014 0946   VLDL 26.0 12/05/2014 0946   LDLCALC 101 (H) 11/26/2016 1104     Other studies Reviewed: Additional studies/ records that were reviewed today include:  Echo 07/31/16 Study Conclusions  - Left ventricle: The cavity size was normal. There was mild focal   basal hypertrophy of the septum. Systolic function was normal.   The estimated ejection fraction was in the range of 55% to 60%.   Wall motion was normal; there were no regional wall motion   abnormalities. Features are consistent with a pseudonormal left   ventricular filling pattern, with  concomitant abnormal relaxation   and increased filling pressure (grade 2 diastolic dysfunction). - Aortic valve: There was mild regurgitation. - Mitral valve: Calcified annulus. There was mild regurgitation. - Left atrium: The atrium was mildly dilated. - Pulmonary arteries: Systolic pressure was mildly increased. PA   peak pressure: 37 mm Hg (S).   ASSESSMENT AND PLAN:  1.  HTN:   Pressure is very elevated today.  Some of this is a compensatory elevation because of her sinus bradycardia.  We will be decreasing the metoprolol to 25 mg twice a day.  We will add amlodipine 5 mg a day.  She has been on amlodipine in the past and tolerated it fairly well.  2.  Bradycardia: Her heart rate is 48.  There is no evidence of heart block.  We will reduce her metoprolol to 25 mg twice a day.  We will have her return to see an APP in 3 to 4 weeks.  If her heart rate is still slow we will consider stopping the metoprolol completely.  She has a history of palpitations in the past which the metoprolol seems to help.  But she may not tolerate metoprolol at this point labs today including a TSH.  She also has had some palpitations in the past.  There is a chance that her palpitations may worsen as we decrease the dose of metoprolol.    3. Aortic Insufficiency :   Stable   4. Hyperlipidemia:   Check labs today.   4. Carotid disease:    Stable.  No symptoms.   Current medicines are reviewed with the patient today.  The patient Has no concerns regarding medicines.  The following changes have been  made:  See above Labs/ tests ordered today include:see above  Disposition:   To see an APP in 3-4 weeks   Signed, Mertie Moores, MD  03/09/2018 11:20 AM    [address], [place], [place] Phone: [phone]; Fax: [phone]

## 2018-03-09 NOTE — Patient Instructions (Addendum)
Medication Instructions:  Your physician has recommended you make the following change in your medication:   DECREASE Metoprolol to 25 mg twice daily (1 pill every 12 hours) START Amlodipine 5 mg once daily   Labwork: TODAY - TSH, BMET, Liver panel, Cholesterol   Testing/Procedures: None Ordered   Follow-Up: Your physician recommends that you schedule a follow-up appointment in: 4 weeks with a PA or Nurse Practitioner on Dr. Elmarie Shiley team   If you need a refill on your cardiac medications before your next appointment, please call your pharmacy.   Thank you for choosing [facility]! Christen Bame, RN [phone]

## 2018-03-25 ENCOUNTER — Encounter: Payer: Self-pay | Admitting: Cardiology

## 2018-04-06 ENCOUNTER — Encounter: Payer: Self-pay | Admitting: Cardiology

## 2018-04-06 ENCOUNTER — Ambulatory Visit: Payer: Medicare Other | Admitting: Cardiology

## 2018-04-06 VITALS — BP 140/60 | HR 56 | Ht 64.0 in | Wt 111.0 lb

## 2018-04-06 DIAGNOSIS — I493 Ventricular premature depolarization: Secondary | ICD-10-CM | POA: Diagnosis not present

## 2018-04-06 DIAGNOSIS — R001 Bradycardia, unspecified: Secondary | ICD-10-CM | POA: Insufficient documentation

## 2018-04-06 DIAGNOSIS — I119 Hypertensive heart disease without heart failure: Secondary | ICD-10-CM

## 2018-04-06 DIAGNOSIS — E782 Mixed hyperlipidemia: Secondary | ICD-10-CM | POA: Diagnosis not present

## 2018-04-06 NOTE — Patient Instructions (Signed)
Medication Instructions: Your physician recommends that you continue on your current medications as directed. Please refer to the Current Medication list given to you today.   Labwork: None Ordered  Procedures/Testing: None Ordered  Follow-Up: Your physician recommends that you schedule a follow-up appointment in: 6 months with Dr.Nahser    Any Additional Special Instructions Will Be Listed Below (If Applicable).   DASH Eating Plan DASH stands for "Dietary Approaches to Stop Hypertension." The DASH eating plan is a healthy eating plan that has been shown to reduce high blood pressure (hypertension). It may also reduce your risk for type 2 diabetes, heart disease, and stroke. The DASH eating plan may also help with weight loss. What are tips for following this plan? General guidelines  Avoid eating more than 2,300 mg (milligrams) of salt (sodium) a day. If you have hypertension, you may need to reduce your sodium intake to 1,500 mg a day.  Limit alcohol intake to no more than 1 drink a day for nonpregnant women and 2 drinks a day for men. One drink equals 12 oz of beer, 5 oz of wine, or 1 oz of hard liquor.  Work with your health care provider to maintain a healthy body weight or to lose weight. Ask what an ideal weight is for you.  Get at least 30 minutes of exercise that causes your heart to beat faster (aerobic exercise) most days of the week. Activities may include walking, swimming, or biking.  Work with your health care provider or diet and nutrition specialist (dietitian) to adjust your eating plan to your individual calorie needs. Reading food labels  Check food labels for the amount of sodium per serving. Choose foods with less than 5 percent of the Daily Value of sodium. Generally, foods with less than 300 mg of sodium per serving fit into this eating plan.  To find whole grains, look for the word "whole" as the first word in the ingredient list. Shopping  Buy products  labeled as "low-sodium" or "no salt added."  Buy fresh foods. Avoid canned foods and premade or frozen meals. Cooking  Avoid adding salt when cooking. Use salt-free seasonings or herbs instead of table salt or sea salt. Check with your health care provider or pharmacist before using salt substitutes.  Do not fry foods. Cook foods using healthy methods such as baking, boiling, grilling, and broiling instead.  Cook with heart-healthy oils, such as olive, canola, soybean, or sunflower oil. Meal planning   Eat a balanced diet that includes: ? 5 or more servings of fruits and vegetables each day. At each meal, try to fill half of your plate with fruits and vegetables. ? Up to 6-8 servings of whole grains each day. ? Less than 6 oz of lean meat, poultry, or fish each day. A 3-oz serving of meat is about the same size as a deck of cards. One egg equals 1 oz. ? 2 servings of low-fat dairy each day. ? A serving of nuts, seeds, or beans 5 times each week. ? Heart-healthy fats. Healthy fats called Omega-3 fatty acids are found in foods such as flaxseeds and coldwater fish, like sardines, salmon, and mackerel.  Limit how much you eat of the following: ? Canned or prepackaged foods. ? Food that is high in trans fat, such as fried foods. ? Food that is high in saturated fat, such as fatty meat. ? Sweets, desserts, sugary drinks, and other foods with added sugar. ? Full-fat dairy products.  Do not salt  foods before eating.  Try to eat at least 2 vegetarian meals each week.  Eat more home-cooked food and less restaurant, buffet, and fast food.  When eating at a restaurant, ask that your food be prepared with less salt or no salt, if possible. What foods are recommended? The items listed may not be a complete list. Talk with your dietitian about what dietary choices are best for you. Grains Whole-grain or whole-wheat bread. Whole-grain or whole-wheat pasta. Brown rice. [place]. Bulgur.  Whole-grain and low-sodium cereals. Pita bread. Low-fat, low-sodium crackers. Whole-wheat flour tortillas. Vegetables Fresh or frozen vegetables (raw, steamed, roasted, or grilled). Low-sodium or reduced-sodium tomato and vegetable juice. Low-sodium or reduced-sodium tomato sauce and tomato paste. Low-sodium or reduced-sodium canned vegetables. Fruits All fresh, dried, or frozen fruit. Canned fruit in natural juice (without added sugar). Meat and other protein foods Skinless chicken or [place]. Ground chicken or [place]. Pork with fat trimmed off. Fish and seafood. Egg whites. Dried beans, peas, or lentils. Unsalted nuts, nut butters, and seeds. Unsalted canned beans. Lean cuts of beef with fat trimmed off. Low-sodium, lean deli meat. Dairy Low-fat (1%) or fat-free (skim) milk. Fat-free, low-fat, or reduced-fat cheeses. Nonfat, low-sodium ricotta or cottage cheese. Low-fat or nonfat yogurt. Low-fat, low-sodium cheese. Fats and oils Soft margarine without trans fats. Vegetable oil. Low-fat, reduced-fat, or light mayonnaise and salad dressings (reduced-sodium). Canola, safflower, olive, soybean, and sunflower oils. Avocado. Seasoning and other foods Herbs. Spices. Seasoning mixes without salt. Unsalted popcorn and pretzels. Fat-free sweets. What foods are not recommended? The items listed may not be a complete list. Talk with your dietitian about what dietary choices are best for you. Grains Baked goods made with fat, such as croissants, muffins, or some breads. Dry pasta or rice meal packs. Vegetables Creamed or fried vegetables. Vegetables in a cheese sauce. Regular canned vegetables (not low-sodium or reduced-sodium). Regular canned tomato sauce and paste (not low-sodium or reduced-sodium). Regular tomato and vegetable juice (not low-sodium or reduced-sodium). Angie Fava. Olives. Fruits Canned fruit in a light or heavy syrup. Fried fruit. Fruit in cream or butter sauce. Meat and other protein  foods Fatty cuts of meat. Ribs. Fried meat. Berniece Salines. Sausage. Bologna and other processed lunch meats. Salami. Fatback. Hotdogs. Bratwurst. Salted nuts and seeds. Canned beans with added salt. Canned or smoked fish. Whole eggs or egg yolks. Chicken or [place] with skin. Dairy Whole or 2% milk, cream, and half-and-half. Whole or full-fat cream cheese. Whole-fat or sweetened yogurt. Full-fat cheese. Nondairy creamers. Whipped toppings. Processed cheese and cheese spreads. Fats and oils Butter. Stick margarine. Lard. Shortening. Ghee. Bacon fat. Tropical oils, such as coconut, palm kernel, or palm oil. Seasoning and other foods Salted popcorn and pretzels. Onion salt, garlic salt, seasoned salt, table salt, and sea salt. Worcestershire sauce. Tartar sauce. Barbecue sauce. Teriyaki sauce. Soy sauce, including reduced-sodium. Steak sauce. Canned and packaged gravies. Fish sauce. Oyster sauce. Cocktail sauce. Horseradish that you find on the shelf. Ketchup. Mustard. Meat flavorings and tenderizers. Bouillon cubes. Hot sauce and Tabasco sauce. Premade or packaged marinades. Premade or packaged taco seasonings. Relishes. Regular salad dressings. Where to find more information:  National Heart, Lung, and [place]: https://wilson-eaton.com/  American Heart Association: www.heart.org Summary  The DASH eating plan is a healthy eating plan that has been shown to reduce high blood pressure (hypertension). It may also reduce your risk for type 2 diabetes, heart disease, and stroke.  With the DASH eating plan, you should limit salt (sodium) intake to 2,300  mg a day. If you have hypertension, you may need to reduce your sodium intake to 1,500 mg a day.  When on the DASH eating plan, aim to eat more fresh fruits and vegetables, whole grains, lean proteins, low-fat dairy, and heart-healthy fats.  Work with your health care provider or diet and nutrition specialist (dietitian) to adjust your eating plan to your  individual calorie needs. This information is not intended to replace advice given to you by your health care provider. Make sure you discuss any questions you have with your health care provider. Document Released: 09/03/2011 Document Revised: 09/07/2016 Document Reviewed: 09/07/2016 Elsevier Interactive Patient Education  [employer].    If you need a refill on your cardiac medications before your next appointment, please call your pharmacy.

## 2018-04-06 NOTE — Progress Notes (Signed)
Cardiology Office Note:    Date:  04/06/2018   ID:  Valerie [name], DOB [birth year], MRN [id number]  PCP:  Valerie Maize, MD  Cardiologist:  Valerie Moores, MD  Referring MD: Valerie Maize, MD   Chief Complaint  Patient presents with  . Hypertension    History of Present Illness:    Valerie [name] is a 84 y.o. female with a past medical history significant for hypertension, hyperlipidemia, diabetes, depression, GERD, CAD, bradycardia, glaucoma lives alone but has family next door close supervision.  She was last seen in the office on 03/09/2018 by Valerie [name] for follow-up of hypertension.  She was having some swimmy headedness and apparently had a cold.  She had been feeling poorly for the previous 3 weeks with upper respiratory infection.  Blood pressure was elevated at 172/68 heart rate of 44.  Valerie [name] felt that the elevated blood pressure was likely in part compensatory due to her sinus bradycardia.  He decreased her metoprolol to 25 mg twice daily and added amlodipine 5 mg daily.  She is here today for follow-up of her blood pressure medication adjustments with her sister. She had no recollection of adding amlodipine as her daughter fixes her pills. A call to her pharmacy confirmed that it was picked up. Her home blood pressures have been better ~120/?. She still has swimmyheadedness but only about twice a month. She had a fall several months ago with no injury but none recently. She has been walking some outside and around the house with no exertional chest discomfort, shortness of breath or lightheadedness. She has a grandson living with her but she does all of her own housework, cooking, driving and shopping without problems.    Past Medical History:  Diagnosis Date  . Anxiety   . Arthritis   . Broken ribs   . Carotid artery disease ([place])   . Cataract   . Collar bone fracture    Right  . Depression   . Diverticulosis of colon with hemorrhage   . Duodenitis   . Esophageal  reflux   . Esophageal stricture   . Essential hypertension   . Glaucoma   . Hiatal hernia   . History of kidney stones     x1   . Hyperlipidemia   . IBS (irritable bowel syndrome)   . Palpitations   . UTI (urinary tract infection) March 2015    Past Surgical History:  Procedure Laterality Date  . CHOLECYSTECTOMY    . COLONOSCOPY    . DILATION AND CURETTAGE OF UTERUS    . EYE SURGERY Bilateral    total of 5  . LASER PHOTO ABLATION Right 10/20/2016   Procedure: LASER PHOTO ABLATION;  Surgeon: Hayden Pedro, MD;  Location: [facility];  Service: Ophthalmology;  Laterality: Right;  Headscope laser  . LEFT ROTATOR CUFF REPAIR X2 Left   . PARS PLANA VITRECTOMY  10/20/2016   WITH 25G REMOVAL/SUTURE SECONDARY INTRAOCULAR LENS, GAS FLUID EXCHANGE   . PARS PLANA VITRECTOMY Right 10/20/2016   Procedure: PARS PLANA VITRECTOMY WITH 25G REMOVAL/SUTURE SECONDARY INTRAOCULAR LENS, GAS FLUID EXCHANGE;  Surgeon: Hayden Pedro, MD;  Location: [facility];  Service: Ophthalmology;  Laterality: Right;  . RIGHT ROTATOR CUFF REPAIR X1 Right   . UPPER GASTROINTESTINAL ENDOSCOPY     with dilation    Current Medications: Current Meds  Medication Sig  . amLODipine (NORVASC) 5 MG tablet Take 1 tablet (5 mg total) by mouth daily.  Marland Kitchen aspirin  81 MG tablet Take 81 mg by mouth daily.    . hydrochlorothiazide (HYDRODIURIL) 25 MG tablet Take 1 tablet (25 mg total) by mouth daily.  Marland Kitchen losartan (COZAAR) 25 MG tablet Take 1 tablet (25 mg total) by mouth daily.  . metoprolol tartrate (LOPRESSOR) 25 MG tablet Take 1 tablet (25 mg total) by mouth 2 (two) times daily.  . nitroGLYCERIN (NITROSTAT) 0.4 MG SL tablet Place 1 tablet (0.4 mg total) under the tongue every 5 (five) minutes as needed for chest pain.  . pravastatin (PRAVACHOL) 10 MG tablet Take 1 tablet (10 mg total) by mouth daily.     Allergies:   Aricept [donepezil hcl]; Lisinopril; Sulfa antibiotics; and Sulfasalazine   Social History   Socioeconomic History    . Marital status: Widowed    Spouse name: Not on file  . Number of children: Not on file  . Years of education: Not on file  . Highest education level: Not on file  Occupational History  . Occupation: retired    Fish farm manager: RETIRED  Social Needs  . Financial resource strain: Not on file  . Food insecurity:    Worry: Not on file    Inability: Not on file  . Transportation needs:    Medical: Not on file    Non-medical: Not on file  Tobacco Use  . Smoking status: Never Smoker  . Smokeless tobacco: Never Used  Substance and Sexual Activity  . Alcohol use: No  . Drug use: No  . Sexual activity: Not on file  Lifestyle  . Physical activity:    Days per week: Not on file    Minutes per session: Not on file  . Stress: Not on file  Relationships  . Social connections:    Talks on phone: Not on file    Gets together: Not on file    Attends religious service: Not on file    Active member of club or organization: Not on file    Attends meetings of clubs or organizations: Not on file    Relationship status: Not on file  Other Topics Concern  . Not on file  Social History Narrative  . Not on file     Family History: The patient's family history includes Bladder Cancer in her brother; Colon cancer (age of onset: 44) in her father; Kidney cancer in her brother; Leukemia in her mother; Stroke in her sister. There is no history of Esophageal cancer or Stomach cancer. ROS:   Please see the history of present illness.     All other systems reviewed and are negative.  EKGs/Labs/Other Studies Reviewed:    The following studies were reviewed today:  Echo 07/31/16 Study Conclusions  - Left ventricle: The cavity size was normal. There was mild focal basal hypertrophy of the septum. Systolic function was normal. The estimated ejection fraction was in the range of 55% to 60%. Wall motion was normal; there were no regional wall motion abnormalities. Features are consistent with  a pseudonormal left ventricular filling pattern, with concomitant abnormal relaxation and increased filling pressure (grade 2 diastolic dysfunction). - Aortic valve: There was mild regurgitation. - Mitral valve: Calcified annulus. There was mild regurgitation. - Left atrium: The atrium was mildly dilated. - Pulmonary arteries: Systolic pressure was mildly increased. PA peak pressure: 37 mm Hg (S).   EKG:  EKG is not ordered today.   Recent Labs: 08/25/2017: Hemoglobin 13.0; Platelets 350 03/09/2018: ALT 12; BUN 30; Creatinine, Ser 0.87; Potassium 4.5; Sodium 139; TSH  1.500   Recent Lipid Panel    Component Value Date/Time   CHOL 186 03/09/2018 1022   TRIG 156 (H) 03/09/2018 1022   HDL 51 03/09/2018 1022   CHOLHDL 3.6 03/09/2018 1022   CHOLHDL 4 12/05/2014 0946   VLDL 26.0 12/05/2014 0946   LDLCALC 104 (H) 03/09/2018 1022    Physical Exam:    VS:  BP 140/60-rechecked 128/60  Pulse (!) 56   Ht 5\' 4"  (1.626 m)   Wt 111 lb (50.3 kg)   SpO2 98%   BMI 19.05 kg/m     Wt Readings from Last 3 Encounters:  04/06/18 111 lb (50.3 kg)  03/09/18 110 lb (49.9 kg)  10/21/17 109 lb 8 oz (49.7 kg)     Physical Exam  Constitutional: She is oriented to person, place, and time. She appears well-developed and well-nourished. No distress.  HENT:  Head: Normocephalic and atraumatic.  Neck: Normal range of motion. Neck supple. No JVD present. Carotid bruit is not present.  Cardiovascular: Normal rate, regular rhythm and intact distal pulses. Exam reveals no gallop and no friction rub.  Murmur heard.  Systolic murmur is present with a grade of 1/6 at the upper right sternal border. Pulmonary/Chest: Effort normal and breath sounds normal.  Abdominal: Soft. Bowel sounds are normal.  Musculoskeletal: Normal range of motion. She exhibits no edema.  Neurological: She is alert and oriented to person, place, and time.  Skin: Skin is warm and dry.  Psychiatric: She has a normal mood and  affect. Her behavior is normal. Thought content normal.  Vitals reviewed.   ASSESSMENT:    1. Benign hypertensive heart disease without heart failure   2. Bradycardia   3. PVC (premature ventricular contraction)   4. Mixed hyperlipidemia    PLAN:    In order of problems listed above:  Hypertension: Blood pressure was elevated last month thought to be in part a compensatory response with sinus bradycardia.  Metoprolol was decreased and amlodipine was added.  Today her blood pressure is improved. Initially 140/60 but down to 128/60 after rest in the office. Continue current medicaitons. Follow up in 6 months with Valerie [name]. We discussed that if her BP starts to creep up at home she will call the office.   Bradycardia: This has been chronic.  No heart block has been noted.  Her metoprolol was decreased at last appointment for HR in the 40's, now improved at 109 and she is asymptomatic.   Palpitations: Patient has a history of palpitations in the past which were well controlled with metoprolol.  TSH is 1.50, 03/09/18. She is only having rare palpitations, none in the last month on the lower dose of BB. It has not been bothering her.   Hyperlipidemia: LDL done 03/09/2018 at was 104.  She was not taking her statin but now has restarted it. Continue current therapy with pravastatin 10 mg daily.   Medication Adjustments/Labs and Tests Ordered: Current medicines are reviewed at length with the patient today.  Concerns regarding medicines are outlined above. Labs and tests ordered and medication changes are outlined in the patient instructions below:  Patient Instructions  Medication Instructions: Your physician recommends that you continue on your current medications as directed. Please refer to the Current Medication list given to you today.   Labwork: None Ordered  Procedures/Testing: None Ordered  Follow-Up: Your physician recommends that you schedule a follow-up appointment in: 6  months with ValerieNahser    Any Additional Special Instructions Will Be  Listed Below (If Applicable).   DASH Eating Plan DASH stands for "Dietary Approaches to Stop Hypertension." The DASH eating plan is a healthy eating plan that has been shown to reduce high blood pressure (hypertension). It may also reduce your risk for type 2 diabetes, heart disease, and stroke. The DASH eating plan may also help with weight loss. What are tips for following this plan? General guidelines  Avoid eating more than 2,300 mg (milligrams) of salt (sodium) a day. If you have hypertension, you may need to reduce your sodium intake to 1,500 mg a day.  Limit alcohol intake to no more than 1 drink a day for nonpregnant women and 2 drinks a day for men. One drink equals 12 oz of beer, 5 oz of wine, or 1 oz of hard liquor.  Work with your health care provider to maintain a healthy body weight or to lose weight. Ask what an ideal weight is for you.  Get at least 30 minutes of exercise that causes your heart to beat faster (aerobic exercise) most days of the week. Activities may include walking, swimming, or biking.  Work with your health care provider or diet and nutrition specialist (dietitian) to adjust your eating plan to your individual calorie needs. Reading food labels  Check food labels for the amount of sodium per serving. Choose foods with less than 5 percent of the Daily Value of sodium. Generally, foods with less than 300 mg of sodium per serving fit into this eating plan.  To find whole grains, look for the word "whole" as the first word in the ingredient list. Shopping  Buy products labeled as "low-sodium" or "no salt added."  Buy fresh foods. Avoid canned foods and premade or frozen meals. Cooking  Avoid adding salt when cooking. Use salt-free seasonings or herbs instead of table salt or sea salt. Check with your health care provider or pharmacist before using salt substitutes.  Do not fry foods.  Cook foods using healthy methods such as baking, boiling, grilling, and broiling instead.  Cook with heart-healthy oils, such as olive, canola, soybean, or sunflower oil. Meal planning   Eat a balanced diet that includes: ? 5 or more servings of fruits and vegetables each day. At each meal, try to fill half of your plate with fruits and vegetables. ? Up to 6-8 servings of whole grains each day. ? Less than 6 oz of lean meat, poultry, or fish each day. A 3-oz serving of meat is about the same size as a deck of cards. One egg equals 1 oz. ? 2 servings of low-fat dairy each day. ? A serving of nuts, seeds, or beans 5 times each week. ? Heart-healthy fats. Healthy fats called Omega-3 fatty acids are found in foods such as flaxseeds and coldwater fish, like sardines, salmon, and mackerel.  Limit how much you eat of the following: ? Canned or prepackaged foods. ? Food that is high in trans fat, such as fried foods. ? Food that is high in saturated fat, such as fatty meat. ? Sweets, desserts, sugary drinks, and other foods with added sugar. ? Full-fat dairy products.  Do not salt foods before eating.  Try to eat at least 2 vegetarian meals each week.  Eat more home-cooked food and less restaurant, buffet, and fast food.  When eating at a restaurant, ask that your food be prepared with less salt or no salt, if possible. What foods are recommended? The items listed may not be a complete list.  Talk with your dietitian about what dietary choices are best for you. Grains Whole-grain or whole-wheat bread. Whole-grain or whole-wheat pasta. Brown rice. [place]. Bulgur. Whole-grain and low-sodium cereals. Pita bread. Low-fat, low-sodium crackers. Whole-wheat flour tortillas. Vegetables Fresh or frozen vegetables (raw, steamed, roasted, or grilled). Low-sodium or reduced-sodium tomato and vegetable juice. Low-sodium or reduced-sodium tomato sauce and tomato paste. Low-sodium or reduced-sodium  canned vegetables. Fruits All fresh, dried, or frozen fruit. Canned fruit in natural juice (without added sugar). Meat and other protein foods Skinless chicken or [place]. Ground chicken or [place]. Pork with fat trimmed off. Fish and seafood. Egg whites. Dried beans, peas, or lentils. Unsalted nuts, nut butters, and seeds. Unsalted canned beans. Lean cuts of beef with fat trimmed off. Low-sodium, lean deli meat. Dairy Low-fat (1%) or fat-free (skim) milk. Fat-free, low-fat, or reduced-fat cheeses. Nonfat, low-sodium ricotta or cottage cheese. Low-fat or nonfat yogurt. Low-fat, low-sodium cheese. Fats and oils Soft margarine without trans fats. Vegetable oil. Low-fat, reduced-fat, or light mayonnaise and salad dressings (reduced-sodium). Canola, safflower, olive, soybean, and sunflower oils. Avocado. Seasoning and other foods Herbs. Spices. Seasoning mixes without salt. Unsalted popcorn and pretzels. Fat-free sweets. What foods are not recommended? The items listed may not be a complete list. Talk with your dietitian about what dietary choices are best for you. Grains Baked goods made with fat, such as croissants, muffins, or some breads. Dry pasta or rice meal packs. Vegetables Creamed or fried vegetables. Vegetables in a cheese sauce. Regular canned vegetables (not low-sodium or reduced-sodium). Regular canned tomato sauce and paste (not low-sodium or reduced-sodium). Regular tomato and vegetable juice (not low-sodium or reduced-sodium). Angie Fava. Olives. Fruits Canned fruit in a light or heavy syrup. Fried fruit. Fruit in cream or butter sauce. Meat and other protein foods Fatty cuts of meat. Ribs. Fried meat. Berniece Salines. Sausage. Bologna and other processed lunch meats. Salami. Fatback. Hotdogs. Bratwurst. Salted nuts and seeds. Canned beans with added salt. Canned or smoked fish. Whole eggs or egg yolks. Chicken or [place] with skin. Dairy Whole or 2% milk, cream, and half-and-half. Whole or  full-fat cream cheese. Whole-fat or sweetened yogurt. Full-fat cheese. Nondairy creamers. Whipped toppings. Processed cheese and cheese spreads. Fats and oils Butter. Stick margarine. Lard. Shortening. Ghee. Bacon fat. Tropical oils, such as coconut, palm kernel, or palm oil. Seasoning and other foods Salted popcorn and pretzels. Onion salt, garlic salt, seasoned salt, table salt, and sea salt. Worcestershire sauce. Tartar sauce. Barbecue sauce. Teriyaki sauce. Soy sauce, including reduced-sodium. Steak sauce. Canned and packaged gravies. Fish sauce. Oyster sauce. Cocktail sauce. Horseradish that you find on the shelf. Ketchup. Mustard. Meat flavorings and tenderizers. Bouillon cubes. Hot sauce and Tabasco sauce. Premade or packaged marinades. Premade or packaged taco seasonings. Relishes. Regular salad dressings. Where to find more information:  National Heart, Lung, and [place]: https://wilson-eaton.com/  American Heart Association: www.heart.org Summary  The DASH eating plan is a healthy eating plan that has been shown to reduce high blood pressure (hypertension). It may also reduce your risk for type 2 diabetes, heart disease, and stroke.  With the DASH eating plan, you should limit salt (sodium) intake to 2,300 mg a day. If you have hypertension, you may need to reduce your sodium intake to 1,500 mg a day.  When on the DASH eating plan, aim to eat more fresh fruits and vegetables, whole grains, lean proteins, low-fat dairy, and heart-healthy fats.  Work with your health care provider or diet and nutrition specialist (dietitian) to adjust  your eating plan to your individual calorie needs. This information is not intended to replace advice given to you by your health care provider. Make sure you discuss any questions you have with your health care provider. Document Released: 09/03/2011 Document Revised: 09/07/2016 Document Reviewed: 09/07/2016 Elsevier Interactive Patient Education  [employer].    If you need a refill on your cardiac medications before your next appointment, please call your pharmacy.     Signed, Daune Perch, NP  04/07/2018 5:56 AM    [place]

## 2018-04-18 ENCOUNTER — Other Ambulatory Visit: Payer: Self-pay | Admitting: Cardiovascular Disease

## 2018-05-23 ENCOUNTER — Encounter: Payer: Self-pay | Admitting: Family

## 2018-05-23 ENCOUNTER — Ambulatory Visit: Payer: Medicare Other | Admitting: Family

## 2018-05-23 VITALS — BP 139/57 | HR 60 | Temp 96.9°F | Ht 64.0 in | Wt 114.0 lb

## 2018-05-23 DIAGNOSIS — S93401A Sprain of unspecified ligament of right ankle, initial encounter: Secondary | ICD-10-CM

## 2018-05-23 DIAGNOSIS — L255 Unspecified contact dermatitis due to plants, except food: Secondary | ICD-10-CM

## 2018-05-23 MED ORDER — METHYLPREDNISOLONE ACETATE 80 MG/ML IJ SUSP
80.0000 mg | Freq: Once | INTRAMUSCULAR | Status: AC
  Administered 2018-05-23: 80 mg via INTRAMUSCULAR

## 2018-05-23 MED ORDER — TRIAMCINOLONE ACETONIDE 0.5 % EX OINT: 1.0000 "application " | TOPICAL_OINTMENT | Freq: Two times a day (BID) | CUTANEOUS | 0 refills | Status: DC

## 2018-05-23 NOTE — Progress Notes (Signed)
Subjective:    Patient ID: Valerie [name], female    DOB: [birth year], 84 y.o.   MRN: [id number]  Chief Complaint  Patient presents with  . right foot pain  . iching rash    Rash  This is a new problem. The current episode started in the past 7 days. The problem has been gradually worsening since onset. The affected locations include the neck, abdomen, back, right upper leg and left upper leg. The rash is characterized by itchiness and redness. She was exposed to plant contact. Pertinent negatives include no congestion, cough, diarrhea, joint pain or shortness of breath. Past treatments include anti-itch cream. The treatment provided mild relief.  Foot Injury   The incident occurred more than 1 week ago. The incident occurred in the yard. The injury mechanism was a direct blow. The pain is present in the right foot. The quality of the pain is described as aching. The pain is at a severity of 9/10. The pain is mild. The pain has been intermittent since onset. Pertinent negatives include no numbness or tingling. She reports no foreign bodies present. The symptoms are aggravated by movement. She has tried rest for the symptoms. The treatment provided mild relief.      Review of Systems  HENT: Negative for congestion.   Respiratory: Negative for cough and shortness of breath.   Gastrointestinal: Negative for diarrhea.  Musculoskeletal: Positive for arthralgias. Negative for joint pain.  Skin: Positive for rash.  Neurological: Negative for tingling and numbness.  All other systems reviewed and are negative.      Objective:   Physical Exam  Constitutional: She is oriented to person, place, and time. She appears well-developed and well-nourished. No distress.  HENT:  Head: Normocephalic and atraumatic.  Eyes: Pupils are equal, round, and reactive to light.  Neck: Normal range of motion. Neck supple. No thyromegaly present.  Cardiovascular: Normal rate, regular rhythm, normal heart sounds  and intact distal pulses.  No murmur heard. Pulmonary/Chest: Effort normal and breath sounds normal. No respiratory distress. She has no wheezes.  Abdominal: Soft. Bowel sounds are normal. She exhibits no distension. There is no tenderness.  Musculoskeletal: She exhibits edema and tenderness.  Right lateral ankle pain with flexion and walking, trace swelling  Neurological: She is alert and oriented to person, place, and time. She has normal reflexes. No cranial nerve deficit.  Skin: Skin is warm and dry. Rash noted.  Psychiatric: She has a normal mood and affect. Her behavior is normal. Judgment and thought content normal.  Vitals reviewed.     BP (!) 139/57   Pulse 60   Temp (!) 96.9 F (36.1 C) (Oral)   Ht 5\' 4"  (1.626 m)   Wt 114 lb (51.7 kg)   BMI 19.57 kg/m      Assessment & Plan:  Valerie [name] comes in today with chief complaint of right foot pain and iching rash   Diagnosis and orders addressed:  1. Contact dermatitis due to plant Do not scratch -Wear protective clothing while outside- Long sleeves and long pants -Take a shower as soon as possible after being outside - triamcinolone ointment (KENALOG) 0.5 %; Apply 1 application topically 2 (two) times daily.  Dispense: 90 g; Refill: 0 - methylPREDNISolone acetate (DEPO-MEDROL) injection 80 mg  2. Sprain of right ankle, unspecified ligament, initial encounter Rest Ice Elevate RTO if symptoms worsen or do not improve  - methylPREDNISolone acetate (DEPO-MEDROL) injection 80 mg   Evelina Dun, FNP

## 2018-05-23 NOTE — Patient Instructions (Signed)
Ankle Sprain  An ankle sprain is a stretch or tear in one of the tough tissues (ligaments) in your ankle.  Follow these instructions at home:   Rest your ankle.   Take over-the-counter and prescription medicines only as told by your doctor.   For 2-3 days, keep your ankle higher than the level of your heart (elevated) as much as possible.   If directed, put ice on the area:  ? Put ice in a plastic bag.  ? Place a towel between your skin and the bag.  ? Leave the ice on for 20 minutes, 2-3 times a day.   If you were given a brace:  ? Wear it as told.  ? Take it off to shower or bathe.  ? Try not to move your ankle much, but wiggle your toes from time to time. This helps to prevent swelling.   If you were given an elastic bandage (dressing):  ? Take it off when you shower or bathe.  ? Try not to move your ankle much, but wiggle your toes from time to time. This helps to prevent swelling.  ? Adjust the bandage to make it more comfortable if it feels too tight.  ? Loosen the bandage if you lose feeling in your foot, your foot tingles, or your foot gets cold and blue.   If you have crutches, use them as told by your doctor. Continue to use them until you can walk without feeling pain in your ankle.  Contact a doctor if:   Your bruises or swelling are quickly getting worse.   Your pain does not get better after you take medicine.  Get help right away if:   You cannot feel your toes or foot.   Your toes or your foot looks blue.   You have very bad pain that gets worse.  This information is not intended to replace advice given to you by your health care provider. Make sure you discuss any questions you have with your health care provider.  Document Released: 03/02/2008 Document Revised: 02/20/2016 Document Reviewed: 04/16/2015  Elsevier Interactive Patient Education  2018 Elsevier Inc[employer]

## 2018-06-11 ENCOUNTER — Encounter: Payer: Self-pay | Admitting: Family

## 2018-06-11 ENCOUNTER — Ambulatory Visit: Payer: Medicare Other | Admitting: Family

## 2018-06-11 VITALS — BP 139/65 | HR 65 | Temp 96.7°F | Ht 64.0 in | Wt 110.0 lb

## 2018-06-11 DIAGNOSIS — J309 Allergic rhinitis, unspecified: Secondary | ICD-10-CM

## 2018-06-11 DIAGNOSIS — R5383 Other fatigue: Secondary | ICD-10-CM

## 2018-06-11 DIAGNOSIS — E538 Deficiency of other specified B group vitamins: Secondary | ICD-10-CM

## 2018-06-11 MED ORDER — FLUTICASONE PROPIONATE 50 MCG/ACT NA SUSP: 2.0000 | Freq: Every day | NASAL | 6 refills | Status: DC

## 2018-06-11 NOTE — Patient Instructions (Signed)

## 2018-06-11 NOTE — Progress Notes (Signed)
   Subjective:    Patient ID: [name], female    DOB: March 20, 1928, 84 y.o.   MRN: [id number]  Chief Complaint  Patient presents with  . Fatigue    x 3 days    HPI PT presents to the office today with fatigue that she noticed about 3 days. Denies any fever, SOB, cough, dysuria, urinary frequency.    Pt had lab work drawn 03/09/18 and had stable TSH and BMP.    Review of Systems  Constitutional: Positive for fatigue.  HENT: Positive for rhinorrhea.   All other systems reviewed and are negative.      Objective:   Physical Exam  Constitutional: She is oriented to person, place, and time. She appears well-developed and well-nourished. No distress.  HENT:  Head: Normocephalic and atraumatic.  Right Ear: External ear normal.  Left Ear: External ear normal.  Nose: Mucosal edema and rhinorrhea present.  Mouth/Throat: Oropharynx is clear and moist.  Eyes: Pupils are equal, round, and reactive to light.  Neck: Normal range of motion. Neck supple. No thyromegaly present.  Cardiovascular: Normal rate, regular rhythm, normal heart sounds and intact distal pulses.  No murmur heard. Pulmonary/Chest: Effort normal and breath sounds normal. No respiratory distress. She has no wheezes.  Abdominal: Soft. Bowel sounds are normal. She exhibits no distension. There is no tenderness.  Musculoskeletal: Normal range of motion. She exhibits no edema or tenderness.  Neurological: She is alert and oriented to person, place, and time. She has normal reflexes. No cranial nerve deficit.  Skin: Skin is warm and dry.  Psychiatric: She has a normal mood and affect. Her behavior is normal. Judgment and thought content normal.  Vitals reviewed.     BP 139/65   Pulse 65   Temp (!) 96.7 F (35.9 C) (Oral)   Ht '5\' 4"'$  (1.626 m)   Wt 110 lb (49.9 kg)   BMI 18.88 kg/m      Assessment & Plan:  GENECIS VELEY comes in today with chief complaint of Fatigue (x 3 days)   Diagnosis and orders  addressed:  1. Fatigue, unspecified type - Urinalysis - Urine Culture - Anemia Profile B - BMP8+EGFR - VITAMIN D 25 Hydroxy (Vit-D Deficiency, Fractures)  2. Allergic rhinitis, unspecified seasonality, unspecified trigger - fluticasone (FLONASE) 50 MCG/ACT nasal spray; Place 2 sprays into both nostrils daily.  Dispense: 16 g; Refill: 6  3. Vitamin B12 deficiency   Labs pending If labs normal could be viral? Start flonase daily  Follow up plan: If symptoms worsen or do not improve. Keep follow up with PCP  Evelina Dun, FNP

## 2018-06-13 ENCOUNTER — Other Ambulatory Visit: Payer: Self-pay | Admitting: Family

## 2018-06-13 LAB — ANEMIA PROFILE B
BASOS ABS: 0 10*3/uL (ref 0.0–0.2)
BASOS: 1 %
EOS (ABSOLUTE): 0.1 10*3/uL (ref 0.0–0.4)
Eos: 2 %
FERRITIN: 225 ng/mL — AB (ref 15–150)
FOLATE: 14.5 ng/mL (ref >3.0)
Hematocrit: 40.4 % (ref 34.0–46.6)
Hemoglobin: 13.4 g/dL (ref 11.1–15.9)
IRON SATURATION: 40 % (ref 15–55)
IRON: 129 ug/dL (ref 27–139)
Immature Grans (Abs): 0 10*3/uL (ref 0.0–0.1)
Immature Granulocytes: 0 %
LYMPHS ABS: 1.9 10*3/uL (ref 0.7–3.1)
Lymphs: 22 %
MCH: 32.8 pg (ref 26.6–33.0)
MCHC: 33.2 g/dL (ref 31.5–35.7)
MCV: 99 fL — AB (ref 79–97)
Monocytes Absolute: 0.6 10*3/uL (ref 0.1–0.9)
Monocytes: 7 %
NEUTROS ABS: 5.9 10*3/uL (ref 1.4–7.0)
Neutrophils: 68 %
PLATELETS: 340 10*3/uL (ref 150–450)
RBC: 4.09 x10E6/uL (ref 3.77–5.28)
RDW: 12.5 % (ref 12.3–15.4)
Retic Ct Pct: 1 % (ref 0.6–2.6)
TIBC: 322 ug/dL (ref 250–450)
UIBC: 193 ug/dL (ref 118–369)
VITAMIN B 12: 264 pg/mL (ref 232–1245)
WBC: 8.6 10*3/uL (ref 3.4–10.8)

## 2018-06-13 LAB — URINALYSIS
BILIRUBIN UA: NEGATIVE
Glucose, UA: NEGATIVE
Ketones, UA: NEGATIVE
Nitrite, UA: NEGATIVE
PH UA: 5.5 (ref 5.0–7.5)
RBC, UA: NEGATIVE
Specific Gravity, UA: 1.02 (ref 1.005–1.030)
Urobilinogen, Ur: 0.2 mg/dL (ref 0.2–1.0)

## 2018-06-13 LAB — BMP8+EGFR
BUN / CREAT RATIO: 30 — AB (ref 12–28)
BUN: 30 mg/dL — AB (ref 8–27)
CO2: 24 mmol/L (ref 20–29)
CREATININE: 1.01 mg/dL — AB (ref 0.57–1.00)
Calcium: 10.1 mg/dL (ref 8.7–10.3)
Chloride: 102 mmol/L (ref 96–106)
GFR, EST AFRICAN AMERICAN: 57 mL/min/{1.73_m2} — AB (ref >59)
GFR, EST NON AFRICAN AMERICAN: 50 mL/min/{1.73_m2} — AB (ref >59)
Glucose: 85 mg/dL (ref 65–99)
Potassium: 4.1 mmol/L (ref 3.5–5.2)
SODIUM: 145 mmol/L — AB (ref 134–144)

## 2018-06-13 LAB — VITAMIN D 25 HYDROXY (VIT D DEFICIENCY, FRACTURES): Vit D, 25-Hydroxy: 23 ng/mL — ABNORMAL LOW (ref 30.0–100.0)

## 2018-06-13 MED ORDER — VITAMIN D (ERGOCALCIFEROL) 1.25 MG (50000 UNIT) PO CAPS: 50000.0000 [IU] | ORAL_CAPSULE | ORAL | 3 refills | Status: DC

## 2018-06-14 ENCOUNTER — Other Ambulatory Visit: Payer: Self-pay | Admitting: Family

## 2018-06-14 LAB — URINE CULTURE

## 2018-06-14 MED ORDER — CIPROFLOXACIN HCL 500 MG PO TABS: 500.0000 mg | ORAL_TABLET | Freq: Two times a day (BID) | ORAL | 0 refills | Status: DC

## 2018-07-08 ENCOUNTER — Other Ambulatory Visit: Payer: Self-pay | Admitting: Cardiovascular Disease

## 2018-07-08 DIAGNOSIS — I160 Hypertensive urgency: Secondary | ICD-10-CM

## 2018-07-08 DIAGNOSIS — I119 Hypertensive heart disease without heart failure: Secondary | ICD-10-CM

## 2018-08-05 ENCOUNTER — Ambulatory Visit (INDEPENDENT_AMBULATORY_CARE_PROVIDER_SITE_OTHER): Payer: Medicare Other

## 2018-08-05 ENCOUNTER — Encounter: Payer: Self-pay | Admitting: Nurse Practitioner

## 2018-08-05 ENCOUNTER — Ambulatory Visit (INDEPENDENT_AMBULATORY_CARE_PROVIDER_SITE_OTHER): Payer: Medicare Other | Admitting: Nurse Practitioner

## 2018-08-05 VITALS — BP 153/69 | HR 56 | Temp 96.8°F | Ht 64.0 in | Wt 108.6 lb

## 2018-08-05 DIAGNOSIS — R0602 Shortness of breath: Secondary | ICD-10-CM

## 2018-08-05 DIAGNOSIS — Z23 Encounter for immunization: Secondary | ICD-10-CM | POA: Diagnosis not present

## 2018-08-05 MED ORDER — NITROGLYCERIN 0.4 MG SL SUBL: 0.4000 mg | SUBLINGUAL_TABLET | SUBLINGUAL | 3 refills | Status: DC | PRN

## 2018-08-05 NOTE — Patient Instructions (Signed)
Shortness of Breath, Adult  Shortness of breath means you have trouble breathing. Your lungs are organs for breathing.  Follow these instructions at home:  Pay attention to any changes in your symptoms. Take these actions to help with your condition:  ? Do not smoke. Smoking can cause shortness of breath. If you need help to quit smoking, ask your doctor.  ? Avoid things that can make it harder to breathe, such as:  ? Mold.  ? Dust.  ? Air pollution.  ? Chemical smells.  ? Things that can cause allergy symptoms (allergens), if you have allergies.  ? Keep your living space clean and free of mold and dust.  ? Rest as needed. Slowly return to your usual activities.  ? Take over-the-counter and prescription medicines, including oxygen and inhaled medicines, only as told by your doctor.  ? Keep all follow-up visits as told by your doctor. This is important.  Contact a doctor if:  ? Your condition does not get better as soon as expected.  ? You have a hard time doing your normal activities, even after you rest.  ? You have new symptoms.  Get help right away if:  ? You have trouble breathing when you are resting.  ? You feel light-headed or you faint.  ? You have a cough that is not helped by medicines.  ? You cough up blood.  ? You have pain with breathing.  ? You have pain in your chest, arms, shoulders, or belly (abdomen).  ? You have a fever.  ? You cannot walk up stairs.  ? You cannot exercise the way you normally do.  This information is not intended to replace advice given to you by your health care provider. Make sure you discuss any questions you have with your health care provider.  Document Released: 03/02/2008 Document Revised: 10/01/2016 Document Reviewed: 10/01/2016  Elsevier Interactive Patient Education ? 2017 Elsevier Inc[employer]

## 2018-08-05 NOTE — Progress Notes (Signed)
   Subjective:    Patient ID: Valerie [name], female    DOB: [birth year], 84 y.o.   MRN: [id number]   Chief Complaint: Shortness of Breath   HPI Patient comes in today c/o sob. Says she was mopping yesterday and became SOB. Has been going on intermittently for 6 months and usually is when she s active doing something. Last EKG in June 2019 showed sinus bradycardia when she had follow up with cardiology.   Review of Systems  Constitutional: Negative.   HENT: Negative.   Respiratory: Positive for shortness of breath (onlhy on exertion).   Cardiovascular: Positive for chest pain (occasional) and palpitations.  Gastrointestinal: Negative.   Neurological: Negative.   Psychiatric/Behavioral: Negative.   All other systems reviewed and are negative.      Objective:   Physical Exam  Constitutional: She appears well-developed and well-nourished.  HENT:  Head: Normocephalic and atraumatic.  Eyes: Pupils are equal, round, and reactive to light.  Neck: Normal range of motion. Neck supple.  Cardiovascular: Normal rate.  Pulmonary/Chest: Effort normal.     BP (!) 153/69   Pulse (!) 56   Temp (!) 96.8 F (36 C) (Oral)   Ht 5\' 4"  (1.626 m)   Wt 108 lb 9.6 oz (49.3 kg)   SpO2 100%   BMI 18.64 kg/m   Chest xray- normal-.other than aortic atherosclerosis reading by Ronnald Collum, FNP  [facility]     Assessment & Plan:  Valerie [name] in today with chief complaint of Shortness of Breath   1. Shortness of breath Rest no strenous activity Stop what doing if develop sob - DG Chest 2 View; Future - Ambulatory referral to Cardiology  [place], FNP

## 2018-08-09 NOTE — Progress Notes (Signed)
Cardiology Office Note   Date:  08/11/2018   ID:  Valerie [name], DOB 10/30/1927, MRN [id number]  PCP:  Valerie Maize, MD  Cardiologist:  Dr. Acie [name]    No chief complaint on file.     History of Present Illness: Valerie [name] is a 84 y.o. female who presents for DOE.   She has a past medical history significant for hypertension, hyperlipidemia, diabetes, depression, GERD, CAD, bradycardia, glaucoma lives alone but has family next door close supervision.  She was last seen  03/09/2018 by Dr. Acie [name] for follow-up of hypertension.  She was having some swimmy headedness and apparently had a cold.  She had been feeling poorly for the previous 3 weeks with upper respiratory infection.  Blood pressure was elevated at 172/68 heart rate of 44.  Dr. Acie [name] felt that the elevated blood pressure was likely in part compensatory due to her sinus bradycardia.  He decreased her metoprolol to 25 mg twice daily and added amlodipine 5 mg daily.  Last visit  follow-up of her blood pressure medication adjustments.  She had no recollection of adding amlodipine as her daughter fixes her pills. A call to her pharmacy confirmed that it was picked up. Her home blood pressures have been better ~120/?. She still has swimmyheadedness but only about twice a month. She had a fall several months ago with no injury  She has been walking some outside and around the house with no exertional chest discomfort, shortness of breath or lightheadedness. She has a grandson living with her but she does all of her own housework, cooking, driving and shopping without problems.   Today is being seen for  Referral by PCP --she had complained of SOB, has been DOE for about 6 months.  Also with chest pain.  CXR was done heart size and pulmonary vascularity are normal, with calcification of the thoracic aorta.  For the last 2-3 weeks she has had irregular heart rate that would last all day and she would be weak and short of breath - if  she would lie down she would feel better and palpitations not as significant.  She never told anyone about this.  This week symptoms are improved.   When asked her weakness began about 2 months ago.  Then the palpitations more recently.     Past Medical History:  Diagnosis Date  . Anxiety   . Arthritis   . Broken ribs   . Carotid artery disease ([place])   . Cataract   . Collar bone fracture    Right  . Depression   . Diverticulosis of colon with hemorrhage   . Duodenitis   . Esophageal reflux   . Esophageal stricture   . Essential hypertension   . Glaucoma   . Hiatal hernia   . History of kidney stones     x1   . Hyperlipidemia   . IBS (irritable bowel syndrome)   . Palpitations   . UTI (urinary tract infection) March 2015    Past Surgical History:  Procedure Laterality Date  . CHOLECYSTECTOMY    . COLONOSCOPY    . DILATION AND CURETTAGE OF UTERUS    . EYE SURGERY Bilateral    total of 5  . LASER PHOTO ABLATION Right 10/20/2016   Procedure: LASER PHOTO ABLATION;  Surgeon: Hayden Pedro, MD;  Location: [facility];  Service: Ophthalmology;  Laterality: Right;  Headscope laser  . LEFT ROTATOR CUFF REPAIR X2 Left   . PARS PLANA  VITRECTOMY  10/20/2016   WITH 25G REMOVAL/SUTURE SECONDARY INTRAOCULAR LENS, GAS FLUID EXCHANGE   . PARS PLANA VITRECTOMY Right 10/20/2016   Procedure: PARS PLANA VITRECTOMY WITH 25G REMOVAL/SUTURE SECONDARY INTRAOCULAR LENS, GAS FLUID EXCHANGE;  Surgeon: Hayden Pedro, MD;  Location: [facility];  Service: Ophthalmology;  Laterality: Right;  . RIGHT ROTATOR CUFF REPAIR X1 Right   . UPPER GASTROINTESTINAL ENDOSCOPY     with dilation     Current Outpatient Medications  Medication Sig Dispense Refill  . amLODipine (NORVASC) 5 MG tablet Take 1 tablet (5 mg total) by mouth daily. 90 tablet 3  . aspirin 81 MG tablet Take 81 mg by mouth daily.      . fluticasone (FLONASE) 50 MCG/ACT nasal spray Place 2 sprays into both nostrils as needed for allergies or  rhinitis.    . hydrochlorothiazide (HYDRODIURIL) 25 MG tablet Take 1 tablet (25 mg total) by mouth daily. 90 tablet 2  . losartan (COZAAR) 25 MG tablet TAKE 1 TABLET BY MOUTH EVERY DAY 90 tablet 2  . metoprolol tartrate (LOPRESSOR) 25 MG tablet Take 1 tablet (25 mg total) by mouth 2 (two) times daily. 180 tablet 3  . nitroGLYCERIN (NITROSTAT) 0.4 MG SL tablet Place 1 tablet (0.4 mg total) under the tongue every 5 (five) minutes as needed for chest pain. 25 tablet 3  . pravastatin (PRAVACHOL) 10 MG tablet TAKE 1 TABLET BY MOUTH ONCE DAILY 90 tablet 3  . Vitamin D, Ergocalciferol, (DRISDOL) 50000 units CAPS capsule Take 1 capsule (50,000 Units total) by mouth every 7 (seven) days. 12 capsule 3   No current facility-administered medications for this visit.     Allergies:   Aricept [donepezil hcl]; Lisinopril; Sulfa antibiotics; and Sulfasalazine    Social History:  The patient  reports that she has never smoked. She has never used smokeless tobacco. She reports that she does not drink alcohol or use drugs.   Family History:  The patient's family history includes Bladder Cancer in her brother; Colon cancer (age of onset: 12) in her father; Kidney cancer in her brother; Leukemia in her mother; Stroke in her sister.    ROS:  General:no colds or fevers, she feels she has lost wt. But numbers have not changed much.   Skin:no rashes or ulcers HEENT:no blurred vision, no congestion CV:see HPI PUL:see HPI GI:no diarrhea constipation or melena, no indigestion GU:no hematuria, no dysuria today recent UTI treated. MS:no joint pain, no claudication Neuro:no syncope but with symptoms she felt she could pass out., no lightheadedness Endo:no diabetes, no thyroid disease  Wt Readings from Last 3 Encounters:  08/11/18 110 lb (49.9 kg)  08/05/18 108 lb 9.6 oz (49.3 kg)  06/11/18 110 lb (49.9 kg)     PHYSICAL EXAM: VS:  BP 124/60   Pulse 60   Ht 5\' 4"  (1.626 m)   Wt 110 lb (49.9 kg)   SpO2 90%    BMI 18.88 kg/m  , BMI Body mass index is 18.88 kg/m. General:Pleasant affect, NAD Skin:Warm and dry, brisk capillary refill HEENT:normocephalic, sclera clear, mucus membranes moist Neck:supple, no JVD, no bruits  Heart:S1S2 RRR with soft murmur, no gallup, rub or click Lungs:clear without rales, rhonchi, or wheezes EHU:DJSH, non tender, + BS, do not palpate liver spleen or masses Ext:no lower ext edema, 2+ pedal pulses, 2+ radial pulses Neuro:alert and oriented X 3, MAE, follows commands, + facial symmetry    EKG:  EKG is ordered today. The ekg ordered today demonstrates SB at  52 otherwise normal.    Recent Labs: 03/09/2018: ALT 12; TSH 1.500 06/11/2018: BUN 30; Creatinine, Ser 1.01; Hemoglobin 13.4; Platelets 340; Potassium 4.1; Sodium 145    Lipid Panel    Component Value Date/Time   CHOL 186 03/09/2018 1022   TRIG 156 (H) 03/09/2018 1022   HDL 51 03/09/2018 1022   CHOLHDL 3.6 03/09/2018 1022   CHOLHDL 4 12/05/2014 0946   VLDL 26.0 12/05/2014 0946   LDLCALC 104 (H) 03/09/2018 1022       Other studies Reviewed: Additional studies/ records that were reviewed today include: . Echo 07/31/16 Study Conclusions  - Left ventricle: The cavity size was normal. There was mild focal   basal hypertrophy of the septum. Systolic function was normal.   The estimated ejection fraction was in the range of 55% to 60%.   Wall motion was normal; there were no regional wall motion   abnormalities. Features are consistent with a pseudonormal left   ventricular filling pattern, with concomitant abnormal relaxation   and increased filling pressure (grade 2 diastolic dysfunction). - Aortic valve: There was mild regurgitation. - Mitral valve: Calcified annulus. There was mild regurgitation. - Left atrium: The atrium was mildly dilated. - Pulmonary arteries: Systolic pressure was mildly increased. PA   peak pressure: 37 mm Hg (S).   ASSESSMENT AND PLAN:  1.  palpitations with weakness  and dyspnea.  Also with bradycardia - discussed with Dr. Acie [name] and will stop BB.  She will wear 2 week event monitor.  This may be tachy brady and we touched on PPM prior to 2 months ago had been doing her own housekeeping and working in the yard. Will check BMP and CBC and Mg+ today.  2.  Sinus bradycardia.  At 4 today.  Follow on monitor. It was placed today.  3.  HTN controlled will monitor off BB.   4.   Aortic insuff stable.   Follow up in 2-3 weeks with Dr. Acie [name] or APP.    Current medicines are reviewed with the patient today.  The patient Has no concerns regarding medicines.  The following changes have been made:  See above Labs/ tests ordered today include:see above  Disposition:   FU:  see above  Signed, Cecilie Kicks, NP  08/11/2018 8:46 AM    [address], [place], [place] Phone: [phone]; Fax: [phone]

## 2018-08-11 ENCOUNTER — Ambulatory Visit (INDEPENDENT_AMBULATORY_CARE_PROVIDER_SITE_OTHER): Payer: Medicare Other

## 2018-08-11 ENCOUNTER — Encounter: Payer: Self-pay | Admitting: Cardiology

## 2018-08-11 ENCOUNTER — Ambulatory Visit: Payer: Medicare Other | Admitting: Cardiology

## 2018-08-11 VITALS — BP 124/60 | HR 60 | Ht 64.0 in | Wt 110.0 lb

## 2018-08-11 DIAGNOSIS — R002 Palpitations: Secondary | ICD-10-CM | POA: Diagnosis not present

## 2018-08-11 DIAGNOSIS — R001 Bradycardia, unspecified: Secondary | ICD-10-CM | POA: Diagnosis not present

## 2018-08-11 DIAGNOSIS — R0602 Shortness of breath: Secondary | ICD-10-CM | POA: Diagnosis not present

## 2018-08-11 DIAGNOSIS — I493 Ventricular premature depolarization: Secondary | ICD-10-CM

## 2018-08-11 DIAGNOSIS — I351 Nonrheumatic aortic (valve) insufficiency: Secondary | ICD-10-CM

## 2018-08-11 LAB — CBC
Hematocrit: 37.7 % (ref 34.0–46.6)
Hemoglobin: 13.3 g/dL (ref 11.1–15.9)
MCH: 32 pg (ref 26.6–33.0)
MCHC: 35.3 g/dL (ref 31.5–35.7)
MCV: 91 fL (ref 79–97)
PLATELETS: 355 10*3/uL (ref 150–450)
RBC: 4.15 x10E6/uL (ref 3.77–5.28)
RDW: 12.6 % (ref 12.3–15.4)
WBC: 7.3 10*3/uL (ref 3.4–10.8)

## 2018-08-11 LAB — BASIC METABOLIC PANEL
BUN/Creatinine Ratio: 32 — ABNORMAL HIGH (ref 12–28)
BUN: 31 mg/dL — AB (ref 8–27)
CHLORIDE: 102 mmol/L (ref 96–106)
CO2: 25 mmol/L (ref 20–29)
CREATININE: 0.97 mg/dL (ref 0.57–1.00)
Calcium: 10.1 mg/dL (ref 8.7–10.3)
GFR calc Af Amer: 60 mL/min/{1.73_m2} (ref >59)
GFR calc non Af Amer: 52 mL/min/{1.73_m2} — ABNORMAL LOW (ref >59)
GLUCOSE: 89 mg/dL (ref 65–99)
Potassium: 4.7 mmol/L (ref 3.5–5.2)
SODIUM: 141 mmol/L (ref 134–144)

## 2018-08-11 LAB — MAGNESIUM: Magnesium: 2.2 mg/dL (ref 1.6–2.3)

## 2018-08-11 NOTE — Patient Instructions (Addendum)
Medication Instructions:  STOP: Metoprolol ( Lopressor)  If you need a refill on your cardiac medications before your next appointment, please call your pharmacy.   Lab work: TODAY: BMET, CBC & MAGNESIUM   If you have labs (blood work) drawn today and your tests are completely normal, you will receive your results only by: Marland Kitchen MyChart Message (if you have MyChart) OR . A paper copy in the mail If you have any lab test that is abnormal or we need to change your treatment, we will call you to review the results.  Testing/Procedures: Your physician has recommended that you wear an event monitor for 14 days. Event monitors are medical devices that record the heart's electrical activity. Doctors most often [place] these monitors to diagnose arrhythmias. Arrhythmias are problems with the speed or rhythm of the heartbeat. The monitor is a small, portable device. You can wear one while you do your normal daily activities. This is usually used to diagnose what is causing palpitations/syncope (passing out).    Follow-Up: At [facility], you and your health needs are our priority.  As part of our continuing mission to provide you with exceptional heart care, we have created designated Provider Care Teams.  These Care Teams include your primary Cardiologist (physician) and Advanced Practice Providers (APPs -  Physician Assistants and Nurse Practitioners) who all work together to provide you with the care you need, when you need it. You will need a follow up appointment in:  3 weeks.  Please call our office 2 months in advance to schedule this appointment.  You may see Mertie Moores, MD or one of the following Advanced Practice Providers on your designated Care Team on the same day he is in the office [address] PA-C [place], [place] . Daune Perch, NP  Any Other Special Instructions Will Be Listed Below (If Applicable).   Cardiac Event Monitoring A cardiac event monitor is a small recording device  that is used to detect abnormal heart rhythms (arrhythmias). The monitor is used to record your heart rhythm when you have symptoms, such as:  Fast heartbeats (palpitations), such as heart racing or fluttering.  Dizziness.  Fainting or light-headedness.  Unexplained weakness.  Some monitors are wired to electrodes placed on your chest. Electrodes are flat, sticky disks that attach to your skin. Other monitors may be hand-held or worn on the wrist. The monitor can be worn for up to 30 days. If the monitor is attached to your chest, a technician will prepare your chest for the electrode placement and show you how to work the monitor. Take time to practice using the monitor before you leave the office. Make sure you understand how to send the information from the monitor to your health care provider. In some cases, you may need to use a landline telephone instead of a cell phone. What are the risks? Generally, this device is safe to use, but it possible that the skin under the electrodes will become irritated. How to use your cardiac event monitor  Wear your monitor at all times, except when you are in water: ? Do not let the monitor get wet. ? Take the monitor off when you bathe. Do not swim or use a hot tub with it on.  Keep your skin clean. Do not put body lotion or moisturizer on your chest.  Change the electrodes as told by your health care provider or any time they stop sticking to your skin. You may need to use medical  tape to keep them on.  Try to put the electrodes in slightly different places on your chest to help prevent skin irritation. They must remain in the area under your left breast and in the upper right section of your chest.  Make sure the monitor is safely clipped to your clothing or in a location close to your body that your health care provider recommends.  Press the button to record as soon as you feel heart-related symptoms, such  as: ? Dizziness. ? Weakness. ? Light-headedness. ? Palpitations. ? Thumping or pounding in your chest. ? Shortness of breath. ? Unexplained weakness.  Keep a diary of your activities, such as walking, doing chores, and taking medicine. It is very important to note what you were doing when you pushed the button to record your symptoms. This will help your health care provider determine what might be contributing to your symptoms.  Send the recorded information as recommended by your health care provider. It may take some time for your health care provider to process the results.  Change the batteries as told by your health care provider.  Keep electronic devices away from your monitor. This includes: ? Tablets. ? MP3 players. ? Cell phones.  While wearing your monitor you should avoid: ? Electric blankets. ? Armed forces operational officer. ? Electric toothbrushes. ? Microwave ovens. ? Magnets. ? Metal detectors. Get help right away if:  You have chest pain.  You have extreme difficulty breathing or shortness of breath.  You develop a very fast heartbeat that persists.  You develop dizziness that does not go away.  You faint or constantly feel like you are about to faint. Summary  A cardiac event monitor is a small recording device that is used to help detect abnormal heart rhythms (arrhythmias).  The monitor is used to record your heart rhythm when you have heart-related symptoms.  Make sure you understand how to send the information from the monitor to your health care provider.  It is important to press the button on the monitor when you have any heart-related symptoms.  Keep a diary of your activities, such as walking, doing chores, and taking medicine. It is very important to note what you were doing when you pushed the button to record your symptoms. This will help your health care provider learn what might be causing your symptoms. This information is not intended to replace  advice given to you by your health care provider. Make sure you discuss any questions you have with your health care provider. Document Released: 06/23/2008 Document Revised: 08/29/2016 Document Reviewed: 08/29/2016 Elsevier Interactive Patient Education  2017 [employer].

## 2018-08-12 NOTE — Addendum Note (Signed)
Addended by: Briant Cedar on: 08/12/2018 10:48 AM   Modules accepted: Orders

## 2018-09-01 NOTE — Progress Notes (Signed)
Pt has been made aware of normal result and verbalized understanding.  jw 09/01/18

## 2018-09-07 ENCOUNTER — Ambulatory Visit (INDEPENDENT_AMBULATORY_CARE_PROVIDER_SITE_OTHER): Payer: Medicare Other | Admitting: Cardiovascular Disease

## 2018-09-07 ENCOUNTER — Encounter: Payer: Self-pay | Admitting: Cardiovascular Disease

## 2018-09-07 VITALS — BP 126/58 | HR 75 | Ht 64.0 in | Wt 105.0 lb

## 2018-09-07 DIAGNOSIS — E782 Mixed hyperlipidemia: Secondary | ICD-10-CM

## 2018-09-07 DIAGNOSIS — I10 Essential (primary) hypertension: Secondary | ICD-10-CM

## 2018-09-07 DIAGNOSIS — R001 Bradycardia, unspecified: Secondary | ICD-10-CM | POA: Diagnosis not present

## 2018-09-07 NOTE — Progress Notes (Signed)
Cardiology Office Note   Date:  09/07/2018   ID:  Valerie [name], DOB December 25, 1927, MRN [id number]  PCP:  Eustaquio Maize, MD  Cardiologist:  Dr. Acie Fredrickson    Chief Complaint  Patient presents with  . Hypertension     Please note pt was followed by Dr. Mare Ferrari and with his retirement was seen once by Dr. Domenic Polite in [place], but her daughter who is followed by Dr. Acie Fredrickson would like her seen by Dr. Acie Fredrickson.  He has agreed to see pt.  Previous notes from [place]:  Valerie [name] is a 84 y.o. female who presents for HTN, bradycardia and dizziness.  PMH of HTN, HLD, anxiety, depression, GERD, CAD, glaucoma-underwent intervention approximately 3 months ago, lives alone but has family living next door who provide close supervision, presented to [facility] ED on 06/30/16 with complaints of ongoing blurred vision for several weeks post procedure,? Intermittent dyspnea and arm pain. Along with chest pain that she states is arthritic.  She checks her blood pressures regularly at home and apparently were elevated in the 220/95 range despite compliance with medications. She apparently has not felt well for a couple of days, headache, poor oral intake and visual changes since surgery for which she has recently been seen by her ophthalmologist and has follow-up in 2 months. In the ED, CT head without acute abnormalities, initial blood pressure 220/77. Admitted for hypertensive urgency.  Her troponin was negative.  Hx of neg nuc study in 2015.    She had HR to 46 (though looking back she has been slow before)..  Meds adjusted and plan for outpt echo.  She had complained about SOB but was not clear today she does have at times.   Was with hypokalemia and follow up was with K+ 3.3.    Hx of carotid disease with last check in 09/2015 with 1-39% bil stenosis. To be seen by vascular in Dec.   On her last visit she complained of dizziness.  Her HR was low so I stopped cardizem and added amlodipine.  Also on 48 hour holter  she was in SR 56 to 60 with occ episodes of sinus brady brief to 42 and occ PACs and PVCs.  Her echo with EF of 55 to 60% G2DD, mild Aortic regurg. LA mildly dialted. PA pk pressure 37 mmHg.   Today she feels better with less palpitations and less dizziness.  She does complain of hair loss.  This began with amlodipine.  She had hair loss with lisinopril as well.  No chest pain and no SOB.    November 26, 2016:  Valerie [name] is seen for the first time today.  Seen with [name] ( sister)  She is a transfer from Dr. Mare Ferrari. She has a history of hypertension and bradycardia.  BP has been up and down.  She takes it at home  BP has been as high as 185 and as low as 125 Goes out to eat once a week .   Does not pay attention to the salt in her food.   Tries to avoid fried foods.    Has rare episodes of palpitations . Perhaps twice a month. Worse when she is lying down  Had a 48 hr monitor  - revealed PACS and PVCs   August 30, 2017:  Seen with Son,  Claiborne Billings  Hx of HTN , blood pressure is high today.  She makes no effort to avoid salt. No cardiac issues [place] ,  broke some ribs  Was in the hospital recently for HTN , ( felt some eye pressure )  Still eating some canned foods.   March 09, 2018:  Valerie [name] is seen today for follow-up of her hypertension. Has had some swimmyheadedness - has a cold. Has felt poorly for the past for 3 weeks.  Has had a URI for those 3 weeks No syncope   Dec. 11, 2019:   Has a cold today No syncope No dizziness HR is much better off the metoprolol    Past Medical History:  Diagnosis Date  . Anxiety   . Arthritis   . Broken ribs   . Carotid artery disease ([place])   . Cataract   . Collar bone fracture    Right  . Depression   . Diverticulosis of colon with hemorrhage   . Duodenitis   . Esophageal reflux   . Esophageal stricture   . Essential hypertension   . Glaucoma   . Hiatal hernia   . History of kidney stones     x1   . Hyperlipidemia   . IBS  (irritable bowel syndrome)   . Palpitations   . UTI (urinary tract infection) March 2015    Past Surgical History:  Procedure Laterality Date  . CHOLECYSTECTOMY    . COLONOSCOPY    . DILATION AND CURETTAGE OF UTERUS    . EYE SURGERY Bilateral    total of 5  . LASER PHOTO ABLATION Right 10/20/2016   Procedure: LASER PHOTO ABLATION;  Surgeon: Hayden Pedro, MD;  Location: [place];  Service: Ophthalmology;  Laterality: Right;  Headscope laser  . LEFT ROTATOR CUFF REPAIR X2 Left   . PARS PLANA VITRECTOMY  10/20/2016   WITH 25G REMOVAL/SUTURE SECONDARY INTRAOCULAR LENS, GAS FLUID EXCHANGE   . PARS PLANA VITRECTOMY Right 10/20/2016   Procedure: PARS PLANA VITRECTOMY WITH 25G REMOVAL/SUTURE SECONDARY INTRAOCULAR LENS, GAS FLUID EXCHANGE;  Surgeon: Hayden Pedro, MD;  Location: [place];  Service: Ophthalmology;  Laterality: Right;  . RIGHT ROTATOR CUFF REPAIR X1 Right   . UPPER GASTROINTESTINAL ENDOSCOPY     with dilation     Current Outpatient Medications  Medication Sig Dispense Refill  . amLODipine (NORVASC) 5 MG tablet Take 1 tablet (5 mg total) by mouth daily. 90 tablet 3  . aspirin 81 MG tablet Take 81 mg by mouth daily.      . fluticasone (FLONASE) 50 MCG/ACT nasal spray Place 2 sprays into both nostrils as needed for allergies or rhinitis.    . hydrochlorothiazide (HYDRODIURIL) 25 MG tablet Take 1 tablet (25 mg total) by mouth daily. 90 tablet 2  . losartan (COZAAR) 25 MG tablet TAKE 1 TABLET BY MOUTH EVERY DAY 90 tablet 2  . nitroGLYCERIN (NITROSTAT) 0.4 MG SL tablet Place 1 tablet (0.4 mg total) under the tongue every 5 (five) minutes as needed for chest pain. 25 tablet 3  . pravastatin (PRAVACHOL) 10 MG tablet TAKE 1 TABLET BY MOUTH ONCE DAILY 90 tablet 3  . Vitamin D, Ergocalciferol, (DRISDOL) 50000 units CAPS capsule Take 1 capsule (50,000 Units total) by mouth every 7 (seven) days. 12 capsule 3   No current facility-administered medications for this visit.     Allergies:    Aricept [donepezil hcl]; Lisinopril; Sulfa antibiotics; and Sulfasalazine    Social History:  The patient  reports that she has never smoked. She has never used smokeless tobacco. She reports that she does not drink alcohol or use drugs.  Family History:  The patient's family history includes Bladder Cancer in her brother; Colon cancer (age of onset: 21) in her father; Kidney cancer in her brother; Leukemia in her mother; Stroke in her sister.    Review of systems:    As per current history.  Otherwise the review of systems is negative.  Physical Exam: Blood pressure (!) 126/58, pulse 75, height 5\' 4"  (1.626 m), weight 105 lb (47.6 kg), SpO2 96 %.  GEN:   Elderly female,  HEENT: Normal NECK: No JVD;  Soft bilateral carotid bruits  LYMPHATICS: No lymphadenopathy CARDIAC: RRR   RESPIRATORY:  Clear to auscultation without rales, wheezing or rhonchi  ABDOMEN: Soft, non-tender, non-distended MUSCULOSKELETAL:  No edema; No deformity  SKIN: Warm and dry NEUROLOGIC:  Alert and oriented x 3   EKG:      Recent Labs: 03/09/2018: ALT 12; TSH 1.500 08/11/2018: BUN 31; Creatinine, Ser 0.97; Hemoglobin 13.3; Magnesium 2.2; Platelets 355; Potassium 4.7; Sodium 141   Lipid Panel    Component Value Date/Time   CHOL 186 03/09/2018 1022   TRIG 156 (H) 03/09/2018 1022   HDL 51 03/09/2018 1022   CHOLHDL 3.6 03/09/2018 1022   CHOLHDL 4 12/05/2014 0946   VLDL 26.0 12/05/2014 0946   LDLCALC 104 (H) 03/09/2018 1022     Other studies Reviewed: Additional studies/ records that were reviewed today include:  Echo 07/31/16 Study Conclusions  - Left ventricle: The cavity size was normal. There was mild focal   basal hypertrophy of the septum. Systolic function was normal.   The estimated ejection fraction was in the range of 55% to 60%.   Wall motion was normal; there were no regional wall motion   abnormalities. Features are consistent with a pseudonormal left   ventricular filling pattern,  with concomitant abnormal relaxation   and increased filling pressure (grade 2 diastolic dysfunction). - Aortic valve: There was mild regurgitation. - Mitral valve: Calcified annulus. There was mild regurgitation. - Left atrium: The atrium was mildly dilated. - Pulmonary arteries: Systolic pressure was mildly increased. PA   peak pressure: 37 mm Hg (S).   ASSESSMENT AND PLAN:  1.  HTN:     BP looks great   2.  Bradycardia:     HR is better   3. Aortic Insufficiency :   Stable   4. Hyperlipidemia:  Labs from June look great    4. Carotid disease:    Stable.  No symptoms.  Followed by [facility]    Current medicines are reviewed with the patient today.  The patient Has no concerns regarding medicines.  The following changes have been made:  See above Labs/ tests ordered today include:see above   will see her in a year   Signed, Mertie Moores, MD  09/07/2018 9:00 AM    [facility], [place], [place], [place] Phone: [phone]; Fax: [phone]

## 2018-09-07 NOTE — Patient Instructions (Signed)
Medication Instructions:  Your provider recommends that you continue on your current medications as directed. Please refer to the Current Medication list given to you today.    Labwork: None  Testing/Procedures: None  Follow-Up: Your provider wants you to follow-up in: 1 year with Dr. Acie Fredrickson. You will receive a reminder letter in the mail two months in advance. If you don't receive a letter, please call our office to schedule the follow-up appointment.    Any Other Special Instructions Will Be Listed Below (If Applicable).     If you need a refill on your cardiac medications before your next appointment, please call your pharmacy.

## 2018-09-12 ENCOUNTER — Other Ambulatory Visit: Payer: Self-pay | Admitting: Cardiovascular Disease

## 2018-09-12 MED ORDER — HYDROCHLOROTHIAZIDE 25 MG PO TABS: 25.0000 mg | ORAL_TABLET | Freq: Every day | ORAL | 3 refills | Status: DC

## 2018-10-03 ENCOUNTER — Ambulatory Visit: Payer: Medicare Other | Admitting: Cardiovascular Disease

## 2019-01-13 ENCOUNTER — Observation Stay (HOSPITAL_COMMUNITY)
Admission: EM | Admit: 2019-01-13 | Discharge: 2019-01-15 | Disposition: A | Discharge status: Home / Self Care | Payer: Medicare Other | Attending: Internal Medicine | Admitting: Internal Medicine

## 2019-01-13 ENCOUNTER — Emergency Department (HOSPITAL_COMMUNITY): Payer: Medicare Other

## 2019-01-13 DIAGNOSIS — I351 Nonrheumatic aortic (valve) insufficiency: Secondary | ICD-10-CM | POA: Diagnosis not present

## 2019-01-13 DIAGNOSIS — K219 Gastro-esophageal reflux disease without esophagitis: Secondary | ICD-10-CM | POA: Insufficient documentation

## 2019-01-13 DIAGNOSIS — F419 Anxiety disorder, unspecified: Secondary | ICD-10-CM | POA: Diagnosis not present

## 2019-01-13 DIAGNOSIS — E860 Dehydration: Secondary | ICD-10-CM | POA: Insufficient documentation

## 2019-01-13 DIAGNOSIS — E7800 Pure hypercholesterolemia, unspecified: Secondary | ICD-10-CM | POA: Insufficient documentation

## 2019-01-13 DIAGNOSIS — I700 Atherosclerosis of aorta: Secondary | ICD-10-CM | POA: Insufficient documentation

## 2019-01-13 DIAGNOSIS — Z7951 Long term (current) use of inhaled steroids: Secondary | ICD-10-CM | POA: Insufficient documentation

## 2019-01-13 DIAGNOSIS — Z8679 Personal history of other diseases of the circulatory system: Secondary | ICD-10-CM

## 2019-01-13 DIAGNOSIS — F329 Major depressive disorder, single episode, unspecified: Secondary | ICD-10-CM | POA: Insufficient documentation

## 2019-01-13 DIAGNOSIS — Z79899 Other long term (current) drug therapy: Secondary | ICD-10-CM | POA: Insufficient documentation

## 2019-01-13 DIAGNOSIS — R531 Weakness: Secondary | ICD-10-CM | POA: Insufficient documentation

## 2019-01-13 DIAGNOSIS — I491 Atrial premature depolarization: Secondary | ICD-10-CM | POA: Insufficient documentation

## 2019-01-13 DIAGNOSIS — R2681 Unsteadiness on feet: Secondary | ICD-10-CM | POA: Insufficient documentation

## 2019-01-13 DIAGNOSIS — R55 Syncope and collapse: Principal | ICD-10-CM | POA: Diagnosis present

## 2019-01-13 DIAGNOSIS — Z7982 Long term (current) use of aspirin: Secondary | ICD-10-CM | POA: Insufficient documentation

## 2019-01-13 DIAGNOSIS — I119 Hypertensive heart disease without heart failure: Secondary | ICD-10-CM | POA: Insufficient documentation

## 2019-01-13 DIAGNOSIS — E0590 Thyrotoxicosis, unspecified without thyrotoxic crisis or storm: Secondary | ICD-10-CM | POA: Diagnosis present

## 2019-01-13 DIAGNOSIS — E785 Hyperlipidemia, unspecified: Secondary | ICD-10-CM | POA: Diagnosis not present

## 2019-01-13 DIAGNOSIS — Z87898 Personal history of other specified conditions: Secondary | ICD-10-CM

## 2019-01-13 DIAGNOSIS — K589 Irritable bowel syndrome without diarrhea: Secondary | ICD-10-CM | POA: Diagnosis not present

## 2019-01-13 DIAGNOSIS — I10 Essential (primary) hypertension: Secondary | ICD-10-CM | POA: Diagnosis present

## 2019-01-13 DIAGNOSIS — Z66 Do not resuscitate: Secondary | ICD-10-CM | POA: Diagnosis not present

## 2019-01-13 LAB — BASIC METABOLIC PANEL
Anion gap: 13 (ref 5–15)
BUN: 31 mg/dL — ABNORMAL HIGH (ref 8–23)
CO2: 23 mmol/L (ref 22–32)
Calcium: 10 mg/dL (ref 8.9–10.3)
Chloride: 106 mmol/L (ref 98–111)
Creatinine, Ser: 1.14 mg/dL — ABNORMAL HIGH (ref 0.44–1.00)
GFR calc Af Amer: 49 mL/min — ABNORMAL LOW (ref >60)
GFR calc non Af Amer: 43 mL/min — ABNORMAL LOW (ref >60)
Glucose, Bld: 97 mg/dL (ref 70–99)
Potassium: 4.1 mmol/L (ref 3.5–5.1)
Sodium: 142 mmol/L (ref 135–145)

## 2019-01-13 LAB — URINALYSIS, ROUTINE W REFLEX MICROSCOPIC
Bilirubin Urine: NEGATIVE
Glucose, UA: NEGATIVE mg/dL
Hgb urine dipstick: NEGATIVE
Ketones, ur: NEGATIVE mg/dL
Leukocytes,Ua: NEGATIVE
Nitrite: NEGATIVE
Protein, ur: NEGATIVE mg/dL
Specific Gravity, Urine: 1.014 (ref 1.005–1.030)
pH: 6 (ref 5.0–8.0)

## 2019-01-13 LAB — CBG MONITORING, ED: Glucose-Capillary: 89 mg/dL (ref 70–99)

## 2019-01-13 LAB — CBC
HCT: 41.9 % (ref 36.0–46.0)
Hemoglobin: 13.7 g/dL (ref 12.0–15.0)
MCH: 31.2 pg (ref 26.0–34.0)
MCHC: 32.7 g/dL (ref 30.0–36.0)
MCV: 95.4 fL (ref 80.0–100.0)
Platelets: 364 10*3/uL (ref 150–400)
RBC: 4.39 MIL/uL (ref 3.87–5.11)
RDW: 11.7 % (ref 11.5–15.5)
WBC: 9.3 10*3/uL (ref 4.0–10.5)
nRBC: 0 % (ref 0.0–0.2)

## 2019-01-13 LAB — TROPONIN I: Troponin I: <0.03 ng/mL (ref <0.03)

## 2019-01-13 MED ORDER — SODIUM CHLORIDE 0.9% FLUSH: 3.0000 mL | Freq: Once | INTRAVENOUS | Status: DC

## 2019-01-13 MED ORDER — ENOXAPARIN SODIUM 30 MG/0.3ML ~~LOC~~ SOLN
30.0000 mg | SUBCUTANEOUS | Status: DC
  Administered 2019-01-14: 13:00:00 30 mg via SUBCUTANEOUS
  Filled 2019-01-13 (×2): qty 0.3

## 2019-01-13 MED ORDER — SODIUM CHLORIDE 0.9% FLUSH
3.0000 mL | Freq: Two times a day (BID) | INTRAVENOUS | Status: DC
  Administered 2019-01-14 – 2019-01-15 (×4): 3 mL via INTRAVENOUS

## 2019-01-13 MED ORDER — SODIUM CHLORIDE 0.9 % IV BOLUS
250.0000 mL | Freq: Once | INTRAVENOUS | Status: AC
  Administered 2019-01-13: 22:00:00 250 mL via INTRAVENOUS

## 2019-01-13 MED ORDER — ACETAMINOPHEN 650 MG RE SUPP: 650.0000 mg | Freq: Four times a day (QID) | RECTAL | Status: DC | PRN

## 2019-01-13 MED ORDER — SODIUM CHLORIDE 0.9 % IV SOLN
INTRAVENOUS | Status: DC
  Administered 2019-01-14: 01:00:00 via INTRAVENOUS

## 2019-01-13 MED ORDER — ACETAMINOPHEN 325 MG PO TABS: 650.0000 mg | ORAL_TABLET | Freq: Four times a day (QID) | ORAL | Status: DC | PRN

## 2019-01-13 NOTE — ED Provider Notes (Signed)
[facility] EMERGENCY DEPARTMENT Provider Note   CSN: [id number] Arrival date & time: 01/13/19  1611    History   Chief Complaint Chief Complaint  Patient presents with  . Weakness  . Loss of Consciousness    HPI Valerie [name] is a 85 y.o. female.     85 y.o female with a PMH of HTN, HLD presents to the ED with a chief complaint of weakness, fatigue x weeks. Patient reports feeling like she has been more fatigued, reports being unable to do her daily activities without feeling short of breath.  Patient reports she is also had an increase frequency in urination along with some dysuria but no hematuria.  She reports today she was walking to the kitchen when she suddenly felt dizzy, felt like she was going to collapse, reports she had to stop as she felt very weak holding onto the door frame.  She reports ambulating her dog today, but states she was out of breath when returning home.  She also endorses a mild headache, attributes this to her sinuses.  Patient is currently living with her 65 year old grandson who has been out and about according to patient.  She reports she has not left the house in the past few weeks. She denies any weakness, abdominal pain, or chest pain.      Past Medical History:  Diagnosis Date  . Anxiety   . Arthritis   . Broken ribs   . Carotid artery disease ([place])   . Cataract   . Collar bone fracture    Right  . Depression   . Diverticulosis of colon with hemorrhage   . Duodenitis   . Esophageal reflux   . Esophageal stricture   . Essential hypertension   . Glaucoma   . Hiatal hernia   . History of kidney stones     x1   . Hyperlipidemia   . IBS (irritable bowel syndrome)   . Palpitations   . UTI (urinary tract infection) March 2015    Patient Active Problem List   Diagnosis Date Noted  . Near syncope 01/13/2019  . Essential hypertension 01/13/2019  . Bradycardia 04/06/2018  . PVC (premature ventricular contraction)  11/26/2016  . Hyperlipidemia 11/26/2016  . Dislocated IOL (intraocular lens), posterior, right 10/20/2016  . Posterior dislocation of iol (intraocular lens), right eye 10/20/2016  . Hypertensive urgency 06/30/2016  . History of palpitations 06/30/2016  . Blurry vision, bilateral   . Glaucoma 05/25/2016  . Thyroid nodule 05/20/2015  . Carotid stenosis, non-symptomatic 09/14/2012  . Internal and external hemorrhoids without complication 16/09/930  . Vitamin B12 deficiency 09/15/2011  . Benign hypertensive heart disease without heart failure 05/22/2011  . Pure hypercholesterolemia 05/22/2011  . Stricture esophagus 05/18/2011  . Stricture of duodenum 05/18/2011  . Iron deficiency anemia 05/15/2011  . DIVERTICULOSIS-COLON 02/03/2008    Past Surgical History:  Procedure Laterality Date  . CHOLECYSTECTOMY    . COLONOSCOPY    . DILATION AND CURETTAGE OF UTERUS    . EYE SURGERY Bilateral    total of 5  . LASER PHOTO ABLATION Right 10/20/2016   Procedure: LASER PHOTO ABLATION;  Surgeon: Hayden Pedro, MD;  Location: [facility];  Service: Ophthalmology;  Laterality: Right;  Headscope laser  . LEFT ROTATOR CUFF REPAIR X2 Left   . PARS PLANA VITRECTOMY  10/20/2016   WITH 25G REMOVAL/SUTURE SECONDARY INTRAOCULAR LENS, GAS FLUID EXCHANGE   . PARS PLANA VITRECTOMY Right 10/20/2016   Procedure: PARS PLANA VITRECTOMY  WITH 25G REMOVAL/SUTURE SECONDARY INTRAOCULAR LENS, GAS FLUID EXCHANGE;  Surgeon: Hayden Pedro, MD;  Location: [facility];  Service: Ophthalmology;  Laterality: Right;  . RIGHT ROTATOR CUFF REPAIR X1 Right   . UPPER GASTROINTESTINAL ENDOSCOPY     with dilation     OB History   No obstetric history on file.      Home Medications    Prior to Admission medications   Medication Sig Start Date End Date Taking? Authorizing Provider  amLODipine (NORVASC) 5 MG tablet Take 1 tablet (5 mg total) by mouth daily. 03/09/18   Nahser, Wonda Cheng, MD  aspirin 81 MG tablet Take 81 mg by mouth  daily.      [provider]  fluticasone (FLONASE) 50 MCG/ACT nasal spray Place 2 sprays into both nostrils as needed for allergies or rhinitis.    [provider]  hydrochlorothiazide (HYDRODIURIL) 25 MG tablet Take 1 tablet (25 mg total) by mouth daily. 09/12/18   Nahser, Wonda Cheng, MD  losartan (COZAAR) 25 MG tablet TAKE 1 TABLET BY MOUTH EVERY DAY 07/08/18   Nahser, Wonda Cheng, MD  nitroGLYCERIN (NITROSTAT) 0.4 MG SL tablet Place 1 tablet (0.4 mg total) under the tongue every 5 (five) minutes as needed for chest pain. 08/05/18   Hassell Done Mary-Margaret, FNP  pravastatin (PRAVACHOL) 10 MG tablet TAKE 1 TABLET BY MOUTH ONCE DAILY 04/18/18   Nahser, Wonda Cheng, MD  Vitamin D, Ergocalciferol, (DRISDOL) 50000 units CAPS capsule Take 1 capsule (50,000 Units total) by mouth every 7 (seven) days. 06/13/18   Sharion Balloon, FNP    Family History Family History  Problem Relation Age of Onset  . Leukemia Mother   . Colon cancer Father 57  . Kidney cancer Brother   . Bladder Cancer Brother   . Stroke Sister   . Esophageal cancer Neg Hx   . Stomach cancer Neg Hx     Social History Social History   Tobacco Use  . Smoking status: Never Smoker  . Smokeless tobacco: Never Used  Substance Use Topics  . Alcohol use: No  . Drug use: No     Allergies   Aricept [donepezil hcl]; Lisinopril; Sulfa antibiotics; and Sulfasalazine   Review of Systems Review of Systems  Constitutional: Positive for fatigue. Negative for chills and fever.  HENT: Negative for ear pain and sore throat.   Eyes: Negative for pain and visual disturbance.  Respiratory: Positive for shortness of breath. Negative for cough.   Cardiovascular: Negative for chest pain and palpitations.  Gastrointestinal: Negative for abdominal pain, constipation, diarrhea, nausea and vomiting.  Genitourinary: Positive for dysuria, frequency and urgency. Negative for hematuria.  Musculoskeletal: Negative for arthralgias and back  pain.  Skin: Negative for color change and rash.  Neurological: Positive for weakness. Negative for seizures and syncope.  All other systems reviewed and are negative.    Physical Exam Updated Vital Signs BP 133/63   Pulse 75   Temp 98.3 F (36.8 C) (Rectal)   Resp 19   SpO2 97%   Physical Exam Vitals signs and nursing note reviewed.  Constitutional:      General: She is not in acute distress.    Appearance: She is well-developed. She is ill-appearing.  HENT:     Head: Normocephalic and atraumatic.     Mouth/Throat:     Pharynx: No oropharyngeal exudate.  Eyes:     Pupils: Pupils are equal, round, and reactive to light.  Neck:     Musculoskeletal: Normal  range of motion.  Cardiovascular:     Rate and Rhythm: Regular rhythm.     Heart sounds: Normal heart sounds.  Pulmonary:     Effort: Pulmonary effort is normal. No respiratory distress.     Comments: Diminished throughout all lung fields. Abdominal:     General: Bowel sounds are normal. There is no distension.     Palpations: Abdomen is soft.     Tenderness: There is no abdominal tenderness.  Musculoskeletal:        General: No tenderness or deformity.     Right lower leg: No edema.     Left lower leg: No edema.  Skin:    General: Skin is warm and dry.  Neurological:     Mental Status: She is alert and oriented to person, place, and time.     Comments: Alert, oriented, thought content appropriate. Speech fluent without evidence of aphasia. Able to follow 2 step commands without difficulty.  Cranial Nerves:  II:  Peripheral visual fields grossly normal, pupils, round, reactive to light III,IV, VI: ptosis not present, extra-ocular motions intact bilaterally  V,VII: smile symmetric, facial light touch sensation equal VIII: hearing grossly normal bilaterally  IX,X: midline uvula rise  XI: bilateral shoulder shrug equal and strong XII: midline tongue extension  Motor:  5/5 in upper and lower extremities  bilaterally including strong and equal grip strength and dorsiflexion/plantar flexion       ED Treatments / Results  Labs (all labs ordered are listed, but only abnormal results are displayed) Labs Reviewed  BASIC METABOLIC PANEL - Abnormal; Notable for the following components:      Result Value   BUN 31 (*)    Creatinine, Ser 1.14 (*)    GFR calc non Af Amer 43 (*)    GFR calc Af Amer 49 (*)    All other components within normal limits  CBC  URINALYSIS, ROUTINE W REFLEX MICROSCOPIC  TROPONIN I  CBG MONITORING, ED    EKG EKG Interpretation  Date/Time:  Friday January 13 2019 19:33:25 EDT Ventricular Rate:  87 PR Interval:  150 QRS Duration: 80 QT Interval:  392 QTC Calculation: 471 R Axis:   63 Text Interpretation:  Sinus rhythm with Premature atrial complexes Otherwise normal ECG When compared to prior, PAC present.  No STEMI Confirmed by Antony Blackbird [phone]) on 01/13/2019 8:43:08 PM   Radiology Dg Chest 2 View  Result Date: 01/13/2019 CLINICAL DATA:  85 y/o F; weakness and shortness of breath with exertion for 1 week. Syncopal episode this morning. EXAM: CHEST - 2 VIEW COMPARISON:  11/27/2018 chest radiograph FINDINGS: Normal cardiac silhouette. Aortic atherosclerosis with calcification. Clear lungs. No pleural effusion or pneumothorax. No acute osseous abnormality is evident. Bilateral shoulder arthroplasty noted. IMPRESSION: No acute pulmonary process identified. Electronically Signed   By: Kristine Garbe M.D.   On: 01/13/2019 22:16    Procedures Procedures (including critical care time)  Medications Ordered in ED Medications  sodium chloride flush (NS) 0.9 % injection 3 mL (3 mLs Intravenous Not Given 01/13/19 2218)  sodium chloride 0.9 % bolus 250 mL (0 mLs Intravenous Stopped 01/13/19 2237)     Initial Impression / Assessment and Plan / ED Course  I have reviewed the triage vital signs and the nursing notes.  Pertinent labs & imaging results that  were available during my care of the patient were reviewed by me and considered in my medical decision making (see chart for details).    Patient with a  previous history of hypertension, hyperlipidemia presents to the ED with worsening fatigue, weakness times a week.  Had a presyncopal episode today, reports feeling slightly dizzy and holding onto the door rail before her presyncopal episode.  Patient endorses some urinary frequency, states she has been urinating constantly getting up throughout the night along with some dysuria but no hematuria.  Work-up in the emergency department has so far been unremarkable.  CBC showed no leukocytosis, hemoglobin is within normal limits.  CBG was normal.  CMP was remarkable for increase in creatine level at  1.14, increased from her previous visit 5 months ago where he was less than 1.  Letter was negative for any nitrites, leukocytes, white blood cell count.  This x-ray showed no consolidation, pneumothorax, pleural effusion.  Patient was given a bolus of 250 mL's.   10:37 PM Spoke to Daughter who reports she did not witness this episode, according to her nephew they report patient end up syncopized seeing.  Patient does not have a previous cardiac work-up on her chart, did have a carotid Doppler in 2015. Troponin was negative. However, due to patient's generalized weakness along with chest pain and near syncope episode likely to benefit from cardiac workup.   11:09 PM Call placed for cardiology for their recommendations.  11:18 PM Spoke to hospitalist who will admit patient for further workup near syncope.  Portions of this note were generated with Lobbyist. Dictation errors may occur despite best attempts at proofreading.    Final Clinical Impressions(s) / ED Diagnoses   Final diagnoses:  Near syncope  Generalized weakness    ED Discharge Orders    None       Janeece Fitting, PA-C 01/13/19 2324    Tegeler, Gwenyth Allegra, MD  01/15/19 0004

## 2019-01-13 NOTE — ED Triage Notes (Signed)
Pt brought in by [facility] from home for weakness and syncopal episode this morning. Pt states her son helped her to the ground, denies hitting her head. Pt A+Ox4 on EMS arrival and on arrival to ED. Pt states she has felt weak and SOB with exertion x1 week.

## 2019-01-13 NOTE — ED Notes (Signed)
Patient transported to X-ray 

## 2019-01-13 NOTE — H&P (Signed)
History and Physical    Valerie [name] YSA:[id number] DOB: Jun 26, 1928 DOA: 01/13/2019  PCP: System, Pcp Not In  Patient coming from: Home  I have personally briefly reviewed patient's old medical records in [place]  Chief Complaint: Near syncope  HPI: Valerie [name] is a 85 y.o. female with medical history significant for hypertension, hyperlipidemia, history of bradycardia, and carotid artery stenosis (1-39% bilaterally by Carotid U/S 10/21/2017) who presents to the ED after a near syncopal episode.  Patient states that for the last week she has felt generalized weakness which is new for her.  She has been having sensation of her skipping beats.  She has noticed having a weakness and some lightheadedness and dizziness with activity shortly after getting up from rest.  Yesterday she got up and was walking in the kitchen when she felt very weak and slumped down to the floor.  At that time she says she did not lose consciousness or injure herself.    She does admit that she took sublingual nitroglycerin about 30 minutes before this episode she was concerned her symptoms were related to her heart.  She says she has not been having any chest pain but her concern was regarding the heart skipping beats.  She has no known prior history of coronary artery disease is not sure why she has been prescribed nitroglycerin.  Per her cardiologist note, she had a negative nuclear study in 2015.  She says that she thinks that she has not been eating or keeping up with her fluids as much as she should be.  She otherwise denies any nausea, vomiting, fevers, cough, dyspnea, abdominal pain.  She reports some urinary discomfort.  She denies any history of seizures or seizure-like activity.  ED Course:  Initial vitals in the ED showed BP 149/67, pulse 87, RR 16, temp 97.7 Fahrenheit, SPO2 98% on room air.  Labs are notable for BUN 31, creatinine 1.14, WBC 9.3, hemoglobin 13.7, platelets 364, serum glucose 97,  troponin I <0.03, and negative urinalysis.  2 view chest x-ray was without evidence for focal consolidation, effusion, or edema.  Patient was given NS 250 mLs and the hospitalist service was consulted to admit for further evaluation and management of near syncopal episode.  Review of Systems: As per HPI otherwise 10 point review of systems negative.    Past Medical History:  Diagnosis Date  . Anxiety   . Arthritis   . Broken ribs   . Carotid artery disease ([place])   . Cataract   . Collar bone fracture    Right  . Depression   . Diverticulosis of colon with hemorrhage   . Duodenitis   . Esophageal reflux   . Esophageal stricture   . Essential hypertension   . Glaucoma   . Hiatal hernia   . History of kidney stones     x1   . Hyperlipidemia   . IBS (irritable bowel syndrome)   . Palpitations   . UTI (urinary tract infection) March 2015    Past Surgical History:  Procedure Laterality Date  . CHOLECYSTECTOMY    . COLONOSCOPY    . DILATION AND CURETTAGE OF UTERUS    . EYE SURGERY Bilateral    total of 5  . LASER PHOTO ABLATION Right 10/20/2016   Procedure: LASER PHOTO ABLATION;  Surgeon: Hayden Pedro, MD;  Location: [facility];  Service: Ophthalmology;  Laterality: Right;  Headscope laser  . LEFT ROTATOR CUFF REPAIR X2 Left   .  PARS PLANA VITRECTOMY  10/20/2016   WITH 25G REMOVAL/SUTURE SECONDARY INTRAOCULAR LENS, GAS FLUID EXCHANGE   . PARS PLANA VITRECTOMY Right 10/20/2016   Procedure: PARS PLANA VITRECTOMY WITH 25G REMOVAL/SUTURE SECONDARY INTRAOCULAR LENS, GAS FLUID EXCHANGE;  Surgeon: Hayden Pedro, MD;  Location: [facility];  Service: Ophthalmology;  Laterality: Right;  . RIGHT ROTATOR CUFF REPAIR X1 Right   . UPPER GASTROINTESTINAL ENDOSCOPY     with dilation     reports that she has never smoked. She has never used smokeless tobacco. She reports that she does not drink alcohol or use drugs.  Allergies  Allergen Reactions  . Aricept [Donepezil Hcl] Other (See  Comments)    Nightmares, near syncope, weak, decreased appetite.  . Lisinopril Other (See Comments)    Hair loss  . Sulfa Antibiotics Nausea Only  . Sulfasalazine Nausea Only    Family History  Problem Relation Age of Onset  . Leukemia Mother   . Colon cancer Father 27  . Kidney cancer Brother   . Bladder Cancer Brother   . Stroke Sister   . Esophageal cancer Neg Hx   . Stomach cancer Neg Hx      Prior to Admission medications   Medication Sig Start Date End Date Taking? Authorizing Provider  amLODipine (NORVASC) 5 MG tablet Take 1 tablet (5 mg total) by mouth daily. 03/09/18   Nahser, Wonda Cheng, MD  aspirin 81 MG tablet Take 81 mg by mouth daily.      [provider]  fluticasone (FLONASE) 50 MCG/ACT nasal spray Place 2 sprays into both nostrils as needed for allergies or rhinitis.    [provider]  hydrochlorothiazide (HYDRODIURIL) 25 MG tablet Take 1 tablet (25 mg total) by mouth daily. 09/12/18   Nahser, Wonda Cheng, MD  losartan (COZAAR) 25 MG tablet TAKE 1 TABLET BY MOUTH EVERY DAY 07/08/18   Nahser, Wonda Cheng, MD  nitroGLYCERIN (NITROSTAT) 0.4 MG SL tablet Place 1 tablet (0.4 mg total) under the tongue every 5 (five) minutes as needed for chest pain. 08/05/18   Hassell Done Mary-Margaret, FNP  pravastatin (PRAVACHOL) 10 MG tablet TAKE 1 TABLET BY MOUTH ONCE DAILY 04/18/18   Nahser, Wonda Cheng, MD  Vitamin D, Ergocalciferol, (DRISDOL) 50000 units CAPS capsule Take 1 capsule (50,000 Units total) by mouth every 7 (seven) days. 06/13/18   Sharion Balloon, FNP    Physical Exam: Vitals:   01/13/19 2145 01/13/19 2215 01/13/19 2300 01/14/19 0000  BP: 113/78 116/66 133/63 (!) 142/55  Pulse: 81 78 75 75  Resp: (!) 22 (!) 21 19 18   Temp:      TempSrc:      SpO2: 96% 98% 97% 97%    Constitutional: Thin elderly woman resting in bed, NAD, calm, comfortable Eyes: PERRL, lids and conjunctivae normal ENMT: Mucous membranes are dry. Posterior pharynx clear of any exudate or  lesions. Normal dentition.  Neck: normal, supple, no masses. Respiratory: clear to auscultation bilaterally, no wheezing, no crackles. Normal respiratory effort. No accessory muscle use.  Cardiovascular: Irregularly irregular rhythm, no murmurs / rubs / gallops. No extremity edema. 2+ pedal pulses.  Carotid bruit present on the right. Abdomen: no tenderness, no masses palpated. No hepatosplenomegaly. Bowel sounds positive.  Musculoskeletal: no clubbing / cyanosis. No joint deformity upper and lower extremities. Good ROM, no contractures. Normal muscle tone.  Skin: Senile purpura bilateral lower extremities. Neurologic: CN 2-12 grossly intact. Sensation intact, Strength 5/5 in all 4.  Psychiatric: Normal judgment and insight. Alert and  oriented x 3. Normal mood.    Labs on Admission: I have personally reviewed following labs and imaging studies  CBC: Recent Labs  Lab 01/13/19 1635  WBC 9.3  HGB 13.7  HCT 41.9  MCV 95.4  PLT 856   Basic Metabolic Panel: Recent Labs  Lab 01/13/19 1635  NA 142  K 4.1  CL 106  CO2 23  GLUCOSE 97  BUN 31*  CREATININE 1.14*  CALCIUM 10.0   GFR: CrCl cannot be calculated (Unknown ideal weight.). Liver Function Tests: No results for input(s): AST, ALT, ALKPHOS, BILITOT, PROT, ALBUMIN in the last 168 hours. No results for input(s): LIPASE, AMYLASE in the last 168 hours. No results for input(s): AMMONIA in the last 168 hours. Coagulation Profile: No results for input(s): INR, PROTIME in the last 168 hours. Cardiac Enzymes: Recent Labs  Lab 01/13/19 2213  TROPONINI <0.03   BNP (last 3 results) No results for input(s): PROBNP in the last 8760 hours. HbA1C: No results for input(s): HGBA1C in the last 72 hours. CBG: Recent Labs  Lab 01/13/19 1634  GLUCAP 89   Lipid Profile: No results for input(s): CHOL, HDL, LDLCALC, TRIG, CHOLHDL, LDLDIRECT in the last 72 hours. Thyroid Function Tests: No results for input(s): TSH, T4TOTAL, FREET4,  T3FREE, THYROIDAB in the last 72 hours. Anemia Panel: No results for input(s): VITAMINB12, FOLATE, FERRITIN, TIBC, IRON, RETICCTPCT in the last 72 hours. Urine analysis:    Component Value Date/Time   COLORURINE YELLOW 01/13/2019 2120   APPEARANCEUR CLEAR 01/13/2019 2120   APPEARANCEUR Clear 06/11/2018 1002   LABSPEC 1.014 01/13/2019 2120   PHURINE 6.0 01/13/2019 2120   GLUCOSEU NEGATIVE 01/13/2019 2120   HGBUR NEGATIVE 01/13/2019 2120   BILIRUBINUR NEGATIVE 01/13/2019 2120   BILIRUBINUR Negative 06/11/2018 1002   [place] NEGATIVE 01/13/2019 2120   PROTEINUR NEGATIVE 01/13/2019 2120   UROBILINOGEN negative 11/08/2013 0908   UROBILINOGEN 0.2 11/30/2008 1105   NITRITE NEGATIVE 01/13/2019 2120   LEUKOCYTESUR NEGATIVE 01/13/2019 2120    Radiological Exams on Admission: Dg Chest 2 View  Result Date: 01/13/2019 CLINICAL DATA:  85 y/o F; weakness and shortness of breath with exertion for 1 week. Syncopal episode this morning. EXAM: CHEST - 2 VIEW COMPARISON:  11/27/2018 chest radiograph FINDINGS: Normal cardiac silhouette. Aortic atherosclerosis with calcification. Clear lungs. No pleural effusion or pneumothorax. No acute osseous abnormality is evident. Bilateral shoulder arthroplasty noted. IMPRESSION: No acute pulmonary process identified. Electronically Signed   By: Kristine Garbe M.D.   On: 01/13/2019 22:16    EKG: Independently reviewed. Sinus rhythm with PAC, no acute ischemic changes.  Compared to prior heart rate is improved (sinus bradycardia on previous EKG) and PAC is new.  Assessment/Plan Principal Problem:   Near syncope Active Problems:   Hyperlipidemia   Essential hypertension   History of bradycardia  Valerie [name] is a 85 y.o. female with medical history significant for hypertension, hyperlipidemia, history of bradycardia, and carotid artery stenosis (1-39% bilaterally by Carotid U/S 10/21/2017) who is admitted after a near syncopal episode.  Near  syncope: This was most likely secondary to a hypotensive episode shortly after taking sublingual nitroglycerin in the setting of antihypertensive use and dehydration from diminished oral intake.  She denies any typical chest pain and troponin is negative. Echocardiogram 07/31/2016 showed EF 55-60%, G2DD, mild aortic regurgitation. -IV fluid resuscitation overnight -Advised patient to DC sublingual nitroglycerin -Will hold home amlodipine, HCTZ, losartan for now -Update echocardiogram -PT/OT eval  Palpitations: Palpitations are likely from PACs which  are seen on EKG and telemetry this admission.  Her near syncopal episode was more likely from adverse effect of sublingual nitroglycerin as above.  She is not likely to be a candidate for beta-blocker therapy for symptomatic PACs due to prior history of bradycardia.   -Monitor on telemetry -Patient reassured regarding PACs  History of bradycardia: Heart rate is appropriate this admission.  Bradycardia appears to have resolved after discontinuing beta-blockade in the past. -Continue to monitor  Hypertension: BP stable on admission.  Holding home antihypertensives overnight as above.  Carotid artery stenosis: 1-39% stenosis bilaterally by carotid Dopplers on 10/21/2017.  -Continue pravastatin  Hyperlipidemia: -Continue pravastatin  DVT prophylaxis: Lovenox Code Status: DNR, confirmed with patient Family Communication: None present on admission Disposition Plan: Likely discharge to home in 1-2 days Consults called: None Admission status: Observation   Zada Finders MD [facility] Pager [phone]  If 7PM-7AM, please contact night-coverage [email or web address]  01/14/2019, 12:22 AM

## 2019-01-13 NOTE — ED Notes (Signed)
ED TO INPATIENT HANDOFF REPORT  ED Nurse Name and Phone #: Gifford Shave [phone]  S Name/Age/Gender [name] 85 y.o. female Room/Bed: 031C/031C  Code Status   Code Status: Prior  Home/SNF/Other Home Patient oriented to: self, time, place, siuation Is this baseline? Yes   Triage Complete: Triage complete  Chief Complaint syncope/weakness  Triage Note Pt brought in [facility] from home for weakness and syncopal episode this morning. Pt states her son helped her to the ground, denies hitting her head. Pt A+Ox4 on EMS arrival and on arrival to ED. Pt states she has felt weak and SOB with exertion x1 week.   Allergies Allergies  Allergen Reactions  . Aricept [Donepezil Hcl] Other (See Comments)    Nightmares, near syncope, weak, decreased appetite.  . Lisinopril Other (See Comments)    Hair loss  . Sulfa Antibiotics Nausea Only  . Sulfasalazine Nausea Only    Level of Care/Admitting Diagnosis ED Disposition    ED Disposition Condition [facility] Area: [place] [[id number]]  Level of Care: Telemetry Cardiac [103]  I expect the patient will be discharged within 24 hours: No (not a candidate for 5C-Observation unit)  Covid Evaluation: N/A  Diagnosis: Near syncope [phone]  Admitting Physician: Lenore Cordia [[id number]]  Attending Physician: Lenore Cordia [[id number]]  PT Class (Do Not Modify): Observation [104]  PT Acc Code (Do Not Modify): Observation [10022]       B Medical/Surgery History Past Medical History:  Diagnosis Date  . Anxiety   . Arthritis   . Broken ribs   . Carotid artery disease ([place])   . Cataract   . Collar bone fracture    Right  . Depression   . Diverticulosis of colon with hemorrhage   . Duodenitis   . Esophageal reflux   . Esophageal stricture   . Essential hypertension   . Glaucoma   . Hiatal hernia   . History of kidney stones     x1   . Hyperlipidemia   . IBS (irritable bowel  syndrome)   . Palpitations   . UTI (urinary tract infection) March 2015   Past Surgical History:  Procedure Laterality Date  . CHOLECYSTECTOMY    . COLONOSCOPY    . DILATION AND CURETTAGE OF UTERUS    . EYE SURGERY Bilateral    total of 5  . LASER PHOTO ABLATION Right 10/20/2016   Procedure: LASER PHOTO ABLATION;  Surgeon: Hayden Pedro, MD;  Location: [facility];  Service: Ophthalmology;  Laterality: Right;  Headscope laser  . LEFT ROTATOR CUFF REPAIR X2 Left   . PARS PLANA VITRECTOMY  10/20/2016   WITH 25G REMOVAL/SUTURE SECONDARY INTRAOCULAR LENS, GAS FLUID EXCHANGE   . PARS PLANA VITRECTOMY Right 10/20/2016   Procedure: PARS PLANA VITRECTOMY WITH 25G REMOVAL/SUTURE SECONDARY INTRAOCULAR LENS, GAS FLUID EXCHANGE;  Surgeon: Hayden Pedro, MD;  Location: [facility];  Service: Ophthalmology;  Laterality: Right;  . RIGHT ROTATOR CUFF REPAIR X1 Right   . UPPER GASTROINTESTINAL ENDOSCOPY     with dilation     A IV Location/Drains/Wounds Patient Lines/Drains/Airways Status   Active Line/Drains/Airways    Name:   Placement date:   Placement time:   Site:   Days:   Peripheral IV 08/25/17 Left Antecubital   08/25/17    1746    Antecubital   506   Peripheral IV 01/13/19 Right Wrist   01/13/19    -  Wrist   less than 1   Incision (Closed) 10/20/16 Eye Right   10/20/16    1406     815          Intake/Output Last 24 hours No intake or output data in the 24 hours ending 01/13/19 2331  Labs/Imaging Results for orders placed or performed during the hospital encounter of 01/13/19 (from the past 48 hour(s))  CBG monitoring, ED     Status: None   Collection Time: 01/13/19  4:34 PM  Result Value Ref Range   Glucose-Capillary 89 70 - 99 mg/dL  Basic metabolic panel     Status: Abnormal   Collection Time: 01/13/19  4:35 PM  Result Value Ref Range   Sodium 142 135 - 145 mmol/L   Potassium 4.1 3.5 - 5.1 mmol/L   Chloride 106 98 - 111 mmol/L   CO2 23 22 - 32 mmol/L   Glucose, Bld 97 70 - 99  mg/dL   BUN 31 (H) 8 - 23 mg/dL   Creatinine, Ser 1.14 (H) 0.44 - 1.00 mg/dL   Calcium 10.0 8.9 - 10.3 mg/dL   GFR calc non Af Amer 43 (L) >60 mL/min   GFR calc Af Amer 49 (L) >60 mL/min   Anion gap 13 5 - 15    Comment: Performed at [facility], [address] [place], [place] [address]  CBC     Status: None   Collection Time: 01/13/19  4:35 PM  Result Value Ref Range   WBC 9.3 4.0 - 10.5 K/uL   RBC 4.39 3.87 - 5.11 MIL/uL   Hemoglobin 13.7 12.0 - 15.0 g/dL   HCT 41.9 36.0 - 46.0 %   MCV 95.4 80.0 - 100.0 fL   MCH 31.2 26.0 - 34.0 pg   MCHC 32.7 30.0 - 36.0 g/dL   RDW 11.7 11.5 - 15.5 %   Platelets 364 150 - 400 K/uL   nRBC 0.0 0.0 - 0.2 %    Comment: Performed at [facility], [place] [address], [place], [place] [address]  Urinalysis, Routine w reflex microscopic     Status: None   Collection Time: 01/13/19  9:20 PM  Result Value Ref Range   Color, Urine YELLOW YELLOW   APPearance CLEAR CLEAR   Specific Gravity, Urine 1.014 1.005 - 1.030   pH 6.0 5.0 - 8.0   Glucose, UA NEGATIVE NEGATIVE mg/dL   Hgb urine dipstick NEGATIVE NEGATIVE   Bilirubin Urine NEGATIVE NEGATIVE   Ketones, ur NEGATIVE NEGATIVE mg/dL   Protein, ur NEGATIVE NEGATIVE mg/dL   Nitrite NEGATIVE NEGATIVE   Leukocytes,Ua NEGATIVE NEGATIVE    Comment: Performed at [facility] [address], [place], [place] [address]  Troponin I - ONCE - STAT     Status: None   Collection Time: 01/13/19 10:13 PM  Result Value Ref Range   Troponin I <0.03 <0.03 ng/mL    Comment: Performed at [facility] [address], [place], [place] [address]   Dg Chest 2 View  Result Date: 01/13/2019 CLINICAL DATA:  85 y/o F; weakness and shortness of breath with exertion for 1 week. Syncopal episode this morning. EXAM: CHEST - 2 VIEW COMPARISON:  11/27/2018 chest radiograph FINDINGS: Normal cardiac silhouette. Aortic atherosclerosis with calcification. Clear lungs. No pleural effusion or pneumothorax. No  acute osseous abnormality is evident. Bilateral shoulder arthroplasty noted. IMPRESSION: No acute pulmonary process identified. Electronically Signed   By: Edgardo Roys.D.  On: 01/13/2019 22:16    Pending Labs Unresulted Labs (From admission, onward)   None      Vitals/Pain Today's Vitals   01/13/19 2215 01/13/19 2238 01/13/19 2300 01/13/19 2321  BP: 116/66  133/63   Pulse: 78  75   Resp: (!) 21  19   Temp:      TempSrc:      SpO2: 98%  97%   PainSc:  0-No pain  0-No pain    Isolation Precautions No active isolations  Medications Medications  sodium chloride flush (NS) 0.9 % injection 3 mL (3 mLs Intravenous Not Given 01/13/19 2218)  sodium chloride 0.9 % bolus 250 mL (0 mLs Intravenous Stopped 01/13/19 2237)    Mobility walks High fall risk   Focused Assessments Cardiac Assessment Handoff:  Cardiac Rhythm: Atrial fibrillation Lab Results  Component Value Date   TROPONINI <0.03 01/13/2019   No results found for: DDIMER Does the Patient currently have chest pain? No     R Recommendations: See Admitting Provider Note  Report given to:   Additional Notes:

## 2019-01-13 NOTE — ED Notes (Signed)
ED Provider at bedside. 

## 2019-01-13 NOTE — ED Notes (Signed)
[name] pts daughter [phone] wants an update

## 2019-01-14 ENCOUNTER — Observation Stay (HOSPITAL_BASED_OUTPATIENT_CLINIC_OR_DEPARTMENT_OTHER): Payer: Medicare Other

## 2019-01-14 ENCOUNTER — Other Ambulatory Visit: Payer: Self-pay

## 2019-01-14 DIAGNOSIS — Z8679 Personal history of other diseases of the circulatory system: Secondary | ICD-10-CM

## 2019-01-14 DIAGNOSIS — R531 Weakness: Secondary | ICD-10-CM | POA: Diagnosis not present

## 2019-01-14 DIAGNOSIS — I351 Nonrheumatic aortic (valve) insufficiency: Secondary | ICD-10-CM

## 2019-01-14 DIAGNOSIS — E0590 Thyrotoxicosis, unspecified without thyrotoxic crisis or storm: Secondary | ICD-10-CM | POA: Diagnosis present

## 2019-01-14 DIAGNOSIS — I10 Essential (primary) hypertension: Secondary | ICD-10-CM | POA: Diagnosis not present

## 2019-01-14 DIAGNOSIS — E782 Mixed hyperlipidemia: Secondary | ICD-10-CM

## 2019-01-14 DIAGNOSIS — R55 Syncope and collapse: Secondary | ICD-10-CM | POA: Diagnosis not present

## 2019-01-14 LAB — HEPATIC FUNCTION PANEL
ALT: 14 U/L (ref 0–44)
AST: 19 U/L (ref 15–41)
Albumin: 3 g/dL — ABNORMAL LOW (ref 3.5–5.0)
Alkaline Phosphatase: 61 U/L (ref 38–126)
Bilirubin, Direct: <0.1 mg/dL (ref 0.0–0.2)
Total Bilirubin: 0.8 mg/dL (ref 0.3–1.2)
Total Protein: 5.5 g/dL — ABNORMAL LOW (ref 6.5–8.1)

## 2019-01-14 LAB — BASIC METABOLIC PANEL
Anion gap: 12 (ref 5–15)
BUN: 28 mg/dL — ABNORMAL HIGH (ref 8–23)
CO2: 23 mmol/L (ref 22–32)
Calcium: 9.3 mg/dL (ref 8.9–10.3)
Chloride: 108 mmol/L (ref 98–111)
Creatinine, Ser: 0.79 mg/dL (ref 0.44–1.00)
GFR calc Af Amer: >60 mL/min (ref >60)
GFR calc non Af Amer: >60 mL/min (ref >60)
Glucose, Bld: 84 mg/dL (ref 70–99)
Potassium: 4 mmol/L (ref 3.5–5.1)
Sodium: 143 mmol/L (ref 135–145)

## 2019-01-14 LAB — T4, FREE: Free T4: 1.93 ng/dL — ABNORMAL HIGH (ref 0.82–1.77)

## 2019-01-14 LAB — CBC
HCT: 32.7 % — ABNORMAL LOW (ref 36.0–46.0)
Hemoglobin: 11.1 g/dL — ABNORMAL LOW (ref 12.0–15.0)
MCH: 32.1 pg (ref 26.0–34.0)
MCHC: 33.9 g/dL (ref 30.0–36.0)
MCV: 94.5 fL (ref 80.0–100.0)
Platelets: 291 10*3/uL (ref 150–400)
RBC: 3.46 MIL/uL — ABNORMAL LOW (ref 3.87–5.11)
RDW: 11.9 % (ref 11.5–15.5)
WBC: 6.3 10*3/uL (ref 4.0–10.5)
nRBC: 0.5 % — ABNORMAL HIGH (ref 0.0–0.2)

## 2019-01-14 LAB — ECHOCARDIOGRAM COMPLETE: Weight: 1703.71 oz

## 2019-01-14 LAB — GLUCOSE, CAPILLARY: Glucose-Capillary: 80 mg/dL (ref 70–99)

## 2019-01-14 LAB — TSH: TSH: 0.179 u[IU]/mL — ABNORMAL LOW (ref 0.350–4.500)

## 2019-01-14 MED ORDER — ADULT MULTIVITAMIN LIQUID CH
15.0000 mL | Freq: Every day | ORAL | Status: DC
  Administered 2019-01-14: 15 mL via ORAL
  Filled 2019-01-14 (×2): qty 15

## 2019-01-14 MED ORDER — ASPIRIN 81 MG PO CHEW
81.0000 mg | CHEWABLE_TABLET | Freq: Every day | ORAL | Status: DC
  Administered 2019-01-14 – 2019-01-15 (×2): 81 mg via ORAL
  Filled 2019-01-14 (×2): qty 1

## 2019-01-14 MED ORDER — ENSURE ENLIVE PO LIQD
237.0000 mL | Freq: Two times a day (BID) | ORAL | Status: DC
  Administered 2019-01-14 – 2019-01-15 (×3): 237 mL via ORAL

## 2019-01-14 MED ORDER — AMLODIPINE BESYLATE 2.5 MG PO TABS
5.0000 mg | ORAL_TABLET | Freq: Every day | ORAL | Status: DC
  Administered 2019-01-14: 08:00:00 5 mg via ORAL
  Filled 2019-01-14: qty 2

## 2019-01-14 MED ORDER — LOSARTAN POTASSIUM 25 MG PO TABS
25.0000 mg | ORAL_TABLET | Freq: Every day | ORAL | Status: DC
  Administered 2019-01-14: 25 mg via ORAL
  Filled 2019-01-14: qty 1

## 2019-01-14 MED ORDER — ATENOLOL 25 MG PO TABS
12.5000 mg | ORAL_TABLET | Freq: Every day | ORAL | Status: DC
  Administered 2019-01-14 – 2019-01-15 (×2): 12.5 mg via ORAL
  Filled 2019-01-14 (×2): qty 0.5

## 2019-01-14 MED ORDER — SODIUM CHLORIDE 0.9 % IV SOLN
INTRAVENOUS | Status: DC
  Administered 2019-01-14 (×3): via INTRAVENOUS

## 2019-01-14 MED ORDER — METHIMAZOLE 5 MG PO TABS
5.0000 mg | ORAL_TABLET | Freq: Every day | ORAL | Status: DC
  Administered 2019-01-14 – 2019-01-15 (×2): 5 mg via ORAL
  Filled 2019-01-14 (×2): qty 1

## 2019-01-14 MED ORDER — PRAVASTATIN SODIUM 10 MG PO TABS
10.0000 mg | ORAL_TABLET | Freq: Every day | ORAL | Status: DC
  Administered 2019-01-14: 10 mg via ORAL
  Filled 2019-01-14: qty 1

## 2019-01-14 MED ORDER — FLUTICASONE PROPIONATE 50 MCG/ACT NA SUSP: 2.0000 | NASAL | Status: DC | PRN

## 2019-01-14 MED ORDER — SODIUM CHLORIDE 0.9 % IV SOLN: INTRAVENOUS | Status: DC

## 2019-01-14 NOTE — Progress Notes (Signed)
Initial Nutrition Assessment  DOCUMENTATION CODES:  Underweight  INTERVENTION:  Ensure Enlive po BID, each supplement provides 350 kcal and 20 grams of protein  MVI with minerals  NUTRITION DIAGNOSIS:  Inadequate oral intake related to early satiety, poor appetite(believed to be age related) as evidenced by per patient/family report.  GOAL:  Patient will meet greater than or equal to 90% of their needs  MONITOR:  PO intake, Supplement acceptance, Labs, I & O's, Weight trends  REASON FOR ASSESSMENT:  Malnutrition Screening Tool    ASSESSMENT:  85 y/o female PMHx HTN/HLD, Anxiety, Depression, CAD, IBS. Presents after having heart palpitation followed by a near syncopal episode. She endorses poor oral intake recently. Admitted for workup.   Was able to get in touch w/ pt by phone, but unfortunately she was very [facility] and could not understand the majority of what RD was asking or telling her.   She did share that she used to weigh 120 lbs, but has fallen to 104 lbs. She also shares she has no appetite, but she does not know why. She says "it has to be something I really like" for her to eat. She does report having ensures at home, but does not say how often she consumes these.   RD attempted to explain to pt her appetite loss and subsequent wt loss are likely d/t age-related anorexia.  Noted appetite loss is part of the normal aging process and she needs to make up for this by prioritizing high calorie foods and drinks, such as the Ensures.   Will continue Ensure BID and add a mvi given her report of a chronically poor appetite.    Labs: BUN/Creat:31/1.14-> 28/.79, Albumin:3.0, Hgb:11.1 Meds Ensure Enlive BID   Recent Labs  Lab 01/13/19 1635 01/14/19 0636  NA 142 143  K 4.1 4.0  CL 106 108  CO2 23 23  BUN 31* 28*  CREATININE 1.14* 0.79  CALCIUM 10.0 9.3  GLUCOSE 97 84   NUTRITION - FOCUSED PHYSICAL EXAM: Unable to assess  Diet Order:   Diet Order            Diet  Heart Room service appropriate? Yes; Fluid consistency: Thin  Diet effective now             EDUCATION NEEDS:  Not appropriate for education at this time  Skin:  Skin Assessment: Reviewed RN Assessment  Last BM:  Unknown  Height:  Ht Readings from Last 1 Encounters:  09/07/18 5\' 4"  (1.626 m)    Weight:  Wt Readings from Last 1 Encounters:  01/14/19 48.3 kg   Ideal Body Weight:  54.54 kg  BMI:  Body mass index is 18.28 kg/m.  Estimated Nutritional Needs:  Kcal:  1400-1600 kcals (29-33 kcal/kg bw Protein:  63-72g (1.3-1.5 g/kg bw) Fluid:  >1.2 L fluid (25 ml/kg bw)  Burtis Junes RD, LDN, CNSC Clinical Nutrition Available Tues-Sat via Pager: [phone] 01/14/2019 6:51 PM

## 2019-01-14 NOTE — Progress Notes (Signed)
  Echocardiogram 2D Echocardiogram has been performed.  Valerie [name] Valerie [name] 01/14/2019, 11:14 AM

## 2019-01-14 NOTE — Progress Notes (Signed)
Progress Note    Valerie [name]  XHB:[id number] DOB: Feb 17, 1928  DOA: 01/13/2019 PCP: System, Pcp Not In    Brief Narrative:   Chief complaint: Weakness  Medical records reviewed and are as summarized below:  Valerie [name] is an 85 y.o. female with past medical history significant for standing, HLD, bradycardia, carotid artery stenosis(1-39% bilaterally by Carotid U/S 10/21/2017) who is admitted after a near syncopal episode.  Assessment/Plan:   Principal Problem:   Near syncope Active Problems:   Hyperlipidemia   Essential hypertension   History of bradycardia   Hyperthyroidism    Near syncope, palpitation: Symptoms initially were thought likely secondary to hypotensive episode shortly after taking some lingual nitroglycerin in the setting of antihypertensive use and dehydration with diminished oral intake.  Patient denied having any chest pain but notes that she has been having intermittent palpitations.  Records show that she had an set up with a Holter monitor previously in 07/2018 that was noted to be within normal limits.  Orthostatic vital signs were negative after patient had received small bolus of normal saline IV fluids.  Telemetry overnight did not show any acute abnormalities.  Symptoms most likely secondary to problem #2. -Follow-up telemetry  Hyperthyroidism: TSH was noted to be low at 0.179.  Free T4 was added on and noted to be elevated at 1.93.  Patient admitted to having generalized weakness, fatigue, and palpitations intermittently.  Patient's overall clinical picture may and more probably be related with the above.  Liver function studies noted to be within normal limits. -Check free T3 in a.m. -Start low-dose atenolol 12.5 mg daily -Start methimazole 5 mg daily -Will need outpatient follow-up and continued treatment  Essential hypertension: Blood pressures currently stable. -Discontinue losartan, amlodipine, and hydrochlorothiazide -Monitor on  atenolol  Generalized weakness: Secondary to #2, but could be multifactorial nature.  Physical therapy evaluated the patient recommended rolling walker.  Patient reports having oral walker at home that they just have to get out of storage. -Appreciate physical therapy consultative services  Dehydration: Patient with elevated BUN to creatinine ratio. -Continue gentle IV fluids of normal saline overnight at 75 mL/h  History of bradycardia: Heart rates have been appropriate this admission.  Patient previously on beta blockers that had been discontinued.  Diastolic dysfunction, aortic insufficiency: Echocardiogram revealing EF of 60 to 65% with impaired laxation and moderate to severe aortic insufficiency.  Patient follows with Dr. Cathie Olden in outpatient setting. -Continue outpatient follow-up  Hyperlipidemia -Continue pravastatin   Body mass index is 18.28 kg/m.   Family Communication/Anticipated D/C date and plan/Code Status   DVT prophylaxis: Lovenox ordered. Code Status: DNR Code.  Family Communication: No family present at bedside Disposition Plan: Discharge home in a.m.   Medical Consultants:    None.   Anti-Infectives:    None  Subjective:   Patient reports that she has been weak and and intermittent palpitations.  Objective:    Vitals:   01/14/19 0000 01/14/19 0032 01/14/19 0459 01/14/19 1121  BP: (!) 142/55 (!) 144/47 121/70 (!) 107/48  Pulse: 75 83 87 80  Resp: 18 19 18 18   Temp:  98.7 F (37.1 C) 98 F (36.7 C) 98.2 F (36.8 C)  TempSrc:  Oral Oral Oral  SpO2: 97% 99% 96% 99%  Weight:   48.3 kg     Intake/Output Summary (Last 24 hours) at 01/14/2019 1845 Last data filed at 01/14/2019 1745 Gross per 24 hour  Intake 1711.9 ml  Output --  Net  1711.9 ml   Filed Weights   01/14/19 0459  Weight: 48.3 kg    Exam: Constitutional: NAD, calm, comfortable Eyes: PERRL, lids and conjunctivae normal ENMT: Mucous membranes are moist. Posterior pharynx  clear of any exudate or lesions.Normal dentition.  Neck: normal, supple, no masses, no thyromegaly Respiratory: clear to auscultation bilaterally, no wheezing, no crackles. Normal respiratory effort. No accessory muscle use.  Cardiovascular: Regular rate and rhythm, no murmurs / rubs / gallops. No extremity edema. 2+ pedal pulses. No carotid bruits.  Abdomen: no tenderness, no masses palpated. No hepatosplenomegaly. Bowel sounds positive.  Musculoskeletal: no clubbing / cyanosis. No joint deformity upper and lower extremities. Good ROM, no contractures. Normal muscle tone.  Skin: no rashes, lesions, ulcers. No induration Neurologic: CN 2-12 grossly intact. Sensation intact, DTR normal. Strength 5/5 in all 4.  Psychiatric: Normal judgment and insight. Alert and oriented x 3. Normal mood.    Data Reviewed:   I have personally reviewed following labs and imaging studies:  Labs: Labs show the following:   Basic Metabolic Panel: Recent Labs  Lab 01/13/19 1635 01/14/19 0636  NA 142 143  K 4.1 4.0  CL 106 108  CO2 23 23  GLUCOSE 97 84  BUN 31* 28*  CREATININE 1.14* 0.79  CALCIUM 10.0 9.3   GFR Estimated Creatinine Clearance: 36.4 mL/min (by C-G formula based on SCr of 0.79 mg/dL). Liver Function Tests: Recent Labs  Lab 01/14/19 0636  AST 19  ALT 14  ALKPHOS 61  BILITOT 0.8  PROT 5.5*  ALBUMIN 3.0*   No results for input(s): LIPASE, AMYLASE in the last 168 hours. No results for input(s): AMMONIA in the last 168 hours. Coagulation profile No results for input(s): INR, PROTIME in the last 168 hours.  CBC: Recent Labs  Lab 01/13/19 1635 01/14/19 0636  WBC 9.3 6.3  HGB 13.7 11.1*  HCT 41.9 32.7*  MCV 95.4 94.5  PLT 364 291   Cardiac Enzymes: Recent Labs  Lab 01/13/19 2213  TROPONINI <0.03   BNP (last 3 results) No results for input(s): PROBNP in the last 8760 hours. CBG: Recent Labs  Lab 01/13/19 1634 01/14/19 0554  GLUCAP 89 80   D-Dimer: No results  for input(s): DDIMER in the last 72 hours. Hgb A1c: No results for input(s): HGBA1C in the last 72 hours. Lipid Profile: No results for input(s): CHOL, HDL, LDLCALC, TRIG, CHOLHDL, LDLDIRECT in the last 72 hours. Thyroid function studies: Recent Labs    01/14/19 0854  TSH 0.179*   Anemia work up: No results for input(s): VITAMINB12, FOLATE, FERRITIN, TIBC, IRON, RETICCTPCT in the last 72 hours. Sepsis Labs: Recent Labs  Lab 01/13/19 1635 01/14/19 0636  WBC 9.3 6.3    Microbiology No results found for this or any previous visit (from the past 240 hour(s)).  Procedures and diagnostic studies:  Dg Chest 2 View  Result Date: 01/13/2019 CLINICAL DATA:  85 y/o F; weakness and shortness of breath with exertion for 1 week. Syncopal episode this morning. EXAM: CHEST - 2 VIEW COMPARISON:  11/27/2018 chest radiograph FINDINGS: Normal cardiac silhouette. Aortic atherosclerosis with calcification. Clear lungs. No pleural effusion or pneumothorax. No acute osseous abnormality is evident. Bilateral shoulder arthroplasty noted. IMPRESSION: No acute pulmonary process identified. Electronically Signed   By: Kristine Garbe M.D.   On: 01/13/2019 22:16    Medications:    aspirin  81 mg Oral Daily   atenolol  12.5 mg Oral Daily   enoxaparin (LOVENOX) injection  30 mg  Subcutaneous Q24H   feeding supplement (ENSURE ENLIVE)  237 mL Oral BID BM   methimazole  5 mg Oral Daily   pravastatin  10 mg Oral q1800   sodium chloride flush  3 mL Intravenous Once   sodium chloride flush  3 mL Intravenous Q12H   Continuous Infusions:  sodium chloride       LOS: 0 days   Jolyssa Oplinger A Luby  [facility]   *Please refer to [email or web address], password [email or web address] to get updated schedule on who will round on this patient, as hospitalists switch teams weekly. If 7PM-7AM, please contact night-coverage at [email or web address], password [email or web address] for any overnight needs.

## 2019-01-14 NOTE — Plan of Care (Signed)
  Problem: Education: Goal: Knowledge of General Education information will improve Description Including pain rating scale, medication(s)/side effects and non-pharmacologic comfort measures Outcome: Progressing   Problem: Health Behavior/Discharge Planning: Goal: Ability to manage health-related needs will improve Outcome: Progressing   Problem: Clinical Measurements: Goal: Ability to maintain clinical measurements within normal limits will improve Outcome: Progressing Goal: Diagnostic test results will improve Outcome: Progressing   Problem: Activity: Goal: Risk for activity intolerance will decrease Outcome: Progressing   Problem: Nutrition: Goal: Adequate nutrition will be maintained Outcome: Progressing   Problem: Elimination: Goal: Will not experience complications related to bowel motility Outcome: Progressing   Problem: Pain Managment: Goal: General experience of comfort will improve Outcome: Progressing   Problem: Skin Integrity: Goal: Risk for impaired skin integrity will decrease Outcome: Progressing

## 2019-01-14 NOTE — Plan of Care (Signed)
  Problem: Education: Goal: Knowledge of General Education information will improve Description Including pain rating scale, medication(s)/side effects and non-pharmacologic comfort measures Outcome: Adequate for Discharge   Problem: Activity: Goal: Risk for activity intolerance will decrease Outcome: Adequate for Discharge   Problem: Nutrition: Goal: Adequate nutrition will be maintained Outcome: Adequate for Discharge

## 2019-01-14 NOTE — Progress Notes (Signed)
Patient arrived to unit [facility] bed 16 from emergency department and assisted to bed by nursing staff.Patient alert and oriented x 4.Denies pain or discomfort at present time.Oriented patient to nursing unit and call bell system.Educated patient not to get out of bed without assistance from nursing staff and verbalized understanding.Patient placed in low bed,yellow bracelet and and socks on, and bed alarm on for safety.No acute distress noted will continue to monitor patient.

## 2019-01-14 NOTE — Evaluation (Signed)
Physical Therapy Evaluation Patient Details Name: Valerie [name] MRN: [id number] DOB: [birth year] Today's Date: 01/14/2019   History of Present Illness  Patient is an 85 year old female who presented to the hosptial with weakness and syncope. She reported the syncope increased over the course of the day yesterday. At baseline she has right foot pain which effects her mobility. PMH: UTI, kidney stones, HTN, depression,, collar bone fx, arthritis , anxiety, broken ribs   Clinical Impression  Patient appears to be at baseline mobility. She was able to stand and ambulate with supervision. She has an antalgic gait. She does not put full weight on her right foot 2nd to pain. She reports this is baseline. Therapy suggested that she uses a single point cane at home to take weight off the right foot and improve stability. She reports a she has a cane and plans to use it. She has no further need for skilled therapy at this time. She did not present with the syncope that she had yesterday during mobility.     Follow Up Recommendations No PT follow up    Equipment Recommendations  Rolling walker with 5" wheels    Recommendations for Other Services       Precautions / Restrictions Precautions Precautions: None Restrictions Weight Bearing Restrictions: No      Mobility  Bed Mobility Overal bed mobility: Independent             General bed mobility comments: able to sit at the edge of the bed without assist   Transfers Overall transfer level: Needs assistance Equipment used: None Transfers: Sit to/from Stand Sit to Stand: Supervision         General transfer comment: supervision for initial balance. No syncope noted   Ambulation/Gait Ambulation/Gait assistance: Supervision Gait Distance (Feet): 120 Feet Assistive device: None Gait Pattern/deviations: Antalgic     General Gait Details: Patient avoided putyting weight on her right foot 2nd to pain. On 2 occasions she listed to the  right. She was able to self correct. She reports this is how she moves at baseline.   Stairs            Wheelchair Mobility    Modified Rankin (Stroke Patients Only)       Balance Overall balance assessment: Needs assistance Sitting-balance support: No upper extremity supported Sitting balance-Leahy Scale: Good     Standing balance support: Bilateral upper extremity supported Standing balance-Leahy Scale: Good Standing balance comment: rolling walker                              Pertinent Vitals/Pain Pain Assessment: No/denies pain    Home Living Family/patient expects to be discharged to:: Private residence Living Arrangements: Other (Comment) Available Help at Discharge: Family Type of Home: House           Additional Comments: licves with her grandson     Prior Function Level of Independence: Independent         Comments: Patient reports she was indepdnent and active prior to her fall yesterday. She still plays conrhole with her grandson. She has a cane at home.      Hand Dominance        Extremity/Trunk Assessment   Upper Extremity Assessment Upper Extremity Assessment: Defer to OT evaluation    Lower Extremity Assessment Lower Extremity Assessment: Overall WFL for tasks assessed       Communication   Communication: No difficulties  Cognition Arousal/Alertness: Awake/alert Behavior During Therapy: WFL for tasks assessed/performed                                   General Comments: Patient did require freqeunt cues to slow down and let therapy get her room ready for mobility.       General Comments      Exercises     Assessment/Plan    PT Assessment Patent does not need any further PT services  PT Problem List         PT Treatment Interventions      PT Goals (Current goals can be found in the Care Plan section)  Acute Rehab PT Goals Patient Stated Goal: to go home  PT Goal Formulation: With  patient Time For Goal Achievement: 01/21/19 Potential to Achieve Goals: Good    Frequency     Barriers to discharge        Co-evaluation               AM-PAC PT "6 Clicks" Mobility  Outcome Measure Help needed turning from your back to your side while in a flat bed without using bedrails?: None Help needed moving from lying on your back to sitting on the side of a flat bed without using bedrails?: None Help needed moving to and from a bed to a chair (including a wheelchair)?: None Help needed standing up from a chair using your arms (e.g., wheelchair or bedside chair)?: None Help needed to walk in hospital room?: A Little Help needed climbing 3-5 steps with a railing? : A Little 6 Click Score: 22    End of Session Equipment Utilized During Treatment: Gait belt Activity Tolerance: Patient tolerated treatment well Patient left: in bed;with call bell/phone within reach;with nursing/sitter in room;with bed alarm set Nurse Communication: Mobility status PT Visit Diagnosis: Unsteadiness on feet (R26.81)    Time: 8502-7741 PT Time Calculation (min) (ACUTE ONLY): 23 min   Charges:   PT Evaluation $PT Eval Moderate Complexity: 1 Mod           Carney Living  PT DPT  01/14/2019, 10:55 AM

## 2019-01-15 ENCOUNTER — Encounter (HOSPITAL_COMMUNITY): Payer: Self-pay

## 2019-01-15 DIAGNOSIS — I10 Essential (primary) hypertension: Secondary | ICD-10-CM | POA: Diagnosis not present

## 2019-01-15 DIAGNOSIS — R55 Syncope and collapse: Secondary | ICD-10-CM | POA: Diagnosis not present

## 2019-01-15 DIAGNOSIS — R531 Weakness: Secondary | ICD-10-CM | POA: Diagnosis not present

## 2019-01-15 DIAGNOSIS — E0590 Thyrotoxicosis, unspecified without thyrotoxic crisis or storm: Secondary | ICD-10-CM | POA: Diagnosis not present

## 2019-01-15 LAB — BASIC METABOLIC PANEL
Anion gap: 9 (ref 5–15)
BUN: 20 mg/dL (ref 8–23)
CO2: 22 mmol/L (ref 22–32)
Calcium: 9 mg/dL (ref 8.9–10.3)
Chloride: 109 mmol/L (ref 98–111)
Creatinine, Ser: 0.69 mg/dL (ref 0.44–1.00)
GFR calc Af Amer: >60 mL/min (ref >60)
GFR calc non Af Amer: >60 mL/min (ref >60)
Glucose, Bld: 80 mg/dL (ref 70–99)
Potassium: 3.7 mmol/L (ref 3.5–5.1)
Sodium: 140 mmol/L (ref 135–145)

## 2019-01-15 LAB — GLUCOSE, CAPILLARY: Glucose-Capillary: 73 mg/dL (ref 70–99)

## 2019-01-15 MED ORDER — ATENOLOL 25 MG PO TABS: 12.5000 mg | ORAL_TABLET | Freq: Every day | ORAL | 0 refills | Status: DC

## 2019-01-15 MED ORDER — METHIMAZOLE 5 MG PO TABS: 5.0000 mg | ORAL_TABLET | Freq: Every day | ORAL | 0 refills | Status: DC

## 2019-01-15 NOTE — Progress Notes (Signed)
Discharge instructions, RX's and follow up appts explained and provided to patient, verbalized understanding. patinet left floor via wheelchair accompanied by staff no c/o pain or shortness of breath.  Vertis Bauder, Tivis Ringer, RN

## 2019-01-15 NOTE — Care Management Obs Status (Signed)
[facility] NOTIFICATION   Patient Details  Name: Valerie [name] MRN: [id number] Date of Birth: 05-23-1928   Medicare Observation Status Notification Given:  Yes    Claudie Leach, RN 01/15/2019, 8:48 AM

## 2019-01-15 NOTE — Discharge Summary (Signed)
Valerie [name], is a 85 y.o. female  DOB 06-04-1928  MRN [id number].  Admission date:  01/13/2019  Admitting Physician  Lenore Cordia, MD  Discharge Date:  01/15/2019   [facility]: [facility]  Recommendations for primary care physician for things to follow:   Further work-up of hyperthyroidism  Discharge Diagnosis Principal Problem:   Near syncope Active Problems:   Hyperlipidemia   Essential hypertension   History of bradycardia   Hyperthyroidism      Past Medical History:  Diagnosis Date   Anxiety    Arthritis    Broken ribs    Carotid artery disease ([place])    Cataract    Collar bone fracture    Right   Depression    Diverticulosis of colon with hemorrhage    Duodenitis    Esophageal reflux    Esophageal stricture    Essential hypertension    Glaucoma    Hiatal hernia    History of kidney stones     x1    Hyperlipidemia    IBS (irritable bowel syndrome)    Palpitations    UTI (urinary tract infection) March 2015    Past Surgical History:  Procedure Laterality Date   CHOLECYSTECTOMY     COLONOSCOPY     DILATION AND CURETTAGE OF UTERUS     EYE SURGERY Bilateral    total of 5   LASER PHOTO ABLATION Right 10/20/2016   Procedure: LASER PHOTO ABLATION;  Surgeon: Hayden Pedro, MD;  Location: [facility];  Service: Ophthalmology;  Laterality: Right;  Headscope laser   LEFT ROTATOR CUFF REPAIR X2 Left    PARS PLANA VITRECTOMY  10/20/2016   WITH 25G REMOVAL/SUTURE SECONDARY INTRAOCULAR LENS, GAS FLUID EXCHANGE    PARS PLANA VITRECTOMY Right 10/20/2016   Procedure: PARS PLANA VITRECTOMY WITH 25G REMOVAL/SUTURE SECONDARY INTRAOCULAR LENS, GAS FLUID EXCHANGE;  Surgeon: Hayden Pedro, MD;  Location: [facility];  Service: Ophthalmology;  Laterality: Right;   RIGHT ROTATOR CUFF  REPAIR X1 Right    UPPER GASTROINTESTINAL ENDOSCOPY     with dilation       HPI  from the history and physical done on the day of admission:  Valerie [name] is a 85 y.o. female with medical history significant for hypertension, hyperlipidemia, history of bradycardia, and carotid artery stenosis (1-39% bilaterally by Carotid U/S 10/21/2017) who presents to the ED after a near syncopal episode.  Patient states that for the last week she has felt generalized weakness which is new for her.  She has been having sensation of her skipping beats.  She has noticed having a weakness and some lightheadedness and dizziness with activity shortly after getting up from rest.  Yesterday she got up and was walking in the kitchen when she felt very weak and slumped down to the floor.  At that time she says she did not lose consciousness or injure herself.    She does admit that she took sublingual nitroglycerin about 30 minutes before this episode  she was concerned her symptoms were related to her heart.  She says she has not been having any chest pain but her concern was regarding the heart skipping beats.  She has no known prior history of coronary artery disease is not sure why she has been prescribed nitroglycerin.  Per her cardiologist note, she had a negative nuclear study in 2015.  She says that she thinks that she has not been eating or keeping up with her fluids as much as she should be.  She otherwise denies any nausea, vomiting, fevers, cough, dyspnea, abdominal pain.  She reports some urinary discomfort.  She denies any history of seizures or seizure-like activity.  ED Course:  Initial vitals in the ED showed BP 149/67, pulse 87, RR 16, temp 97.7 Fahrenheit, SPO2 98% on room air.  Labs are notable for BUN 31, creatinine 1.14, WBC 9.3, hemoglobin 13.7, platelets 364, serum glucose 97, troponin I <0.03, and negative urinalysis.  2 view chest x-ray was without evidence for focal consolidation,  effusion, or edema.  Patient was given NS 250 mLs and the hospitalist service was consulted to admit for further evaluation and management of near syncopal episode.    Hospital Course:   1.  Near syncope, palpitation: Symptoms initially were thought likely secondary to hypotensive episode shortly after taking some lingual nitroglycerin in the setting of antihypertensive use and dehydration with diminished oral intake.  Patient denied having any chest pain but notes that she has been having intermittent palpitations.  Records show that she had an set up with a Holter monitor previously in 07/2018 that was noted to be within normal limits.  Orthostatic vital signs were negative after patient had received small bolus of normal saline IV fluids.  Telemetry overnight did not show any acute abnormalities.  Symptoms most likely secondary to problem #2.  2.  Hyperthyroidism: TSH was noted to be low at 0.179.  Thereafter, checked Free T4 which was elevated at 1.93, and Free T3 which was elevated at 5.3.  Complained of having generalized weakness, fatigue, and palpitations intermittently.  Patient's overall clinical picture suggest autoimmune hyperthyroidism that may have caused problem #1 and 4.  Liver function studies noted to be within normal limits.  Started patient on low-dose atenolol 12.5 mg daily and methimazole 5 mg.  Ambulatory referral for endocrinology was placed for further work-up.  Discussed with patient's daughter over the phone and she manages patient's medications.  3.  Essential hypertension: Blood pressures at the lower end of normal.  Discontinue losartan, amlodipine, and hydrochlorothiazide.  Patient was started on atenolol 12.5 mg daily for hyperthyroidism symptoms.  Advised monitoring and to follow-up with her primary care doctor prior to resuming any of her old home blood pressure medications.  4.  Generalized weakness: Most likely secondary to #2, but could be multifactorial nature.   Physical therapy evaluated the patient recommended rolling walker.  Patient reports having oral walker at home that they just have to get out of storage.  5.  Dehydration: Patient with elevated BUN to creatinine ratio.  Patient was given gentle IV fluids during hospitalization.  6.  History of bradycardia: Heart rates have been appropriate this admission.  Patient previously on beta blockers that had been discontinued.  7.  Diastolic dysfunction, aortic insufficiency: Echocardiogram revealing EF of 60 to 65% with impaired laxation and moderate to severe aortic insufficiency.  Patient follows with Dr. Cathie Olden in outpatient setting.  She is recommended to follow-up with him after this admission.  8.  Hyperlipidemia: Continued pravastatin   Follow UP     Consults obtained : Physical therapy  Discharge Condition: Stable  Diet and Activity recommendation: See Discharge Instructions below  Discharge Instructions    Ambulatory referral to Endocrinology   Complete by:  As directed    Hyperthyroidism   Discharge instructions   Complete by:  As directed    Follow-up with [place] or your cardiologist within 1 to 2 weeks from discharge.  During hospitalization you were found to have hyperthyroidism.  More information provided below.  Due to this condition we have started you on medication known as methimazole as well as atenolol to help decrease heart fluttering.  Please monitor blood pressures at home and keep a record to show at your next doctor visit.  Medications of hydrochlorothiazide, amlodipine, and losartan were recommended to be held as blood pressures were seen to be soft during her hospital stay until able to follow-up with your doctor.  For the hyperthyroidism a referral has been sent to [facility] endocrinology.  They should call you with an appointment within the next week, and please be advised to call their office to follow-up on this.  -  ( we routinely change or add  medications that can affect your baseline labs and fluid status, therefore we recommend that you get the mentioned basic workup next visit with your PCP, your PCP may decide not to get them or add new tests based on their clinical decision)  Activity: As tolerated with fall precautions.  Equipment: Advised to use a rolling walker  Disposition: Home  Diet: Heart healthy     For Heart failure patients - Check your Weight same time everyday, if you gain over 2 pounds, or you develop in leg swelling, experience more shortness of breath or chest pain, call your Primary doctor immediately. Follow Cardiac Low Salt Diet and 1.5 lit/day fluid restriction.  Special Instructions: If you have smoked or chewed Tobacco  in the last 2 yrs please stop smoking, stop any regular Alcohol  and or any Recreational drug use.  On your next visit with your primary care physician please Get Medicines reviewed and adjusted.  Please request your System, Pcp Not In to go over all Hospital Tests and Procedure/Radiological results at the follow up, please get all Hospital records sent to your Prim MD by signing hospital release before you go home.  If you experience worsening of your admission symptoms, develop shortness of breath, life threatening emergency, suicidal or homicidal thoughts you must seek medical attention immediately by calling 911 or calling your MD immediately  if symptoms less severe.  You Must read complete instructions/literature along with all the possible adverse reactions/side effects for all the Medicines you take and that have been prescribed to you. Take any new Medicines after you have completely understood and accpet all the possible adverse reactions/side effects.   Do not drive, operate heavy machinery, perform activities at heights, swimming or participation in water activities or provide baby sitting services if your were admitted for syncope or siezures until you have seen by Primary MD or  a Neurologist and advised to do so again.  Do not drive when taking Pain medications.  Do not take more than prescribed Pain, Sleep and Anxiety Medications  Wear Seat belts while driving.   Please note  You were cared for by a hospitalist during your hospital stay. If you have any questions about your discharge medications or the care you received while you were in  the hospital after you are discharged, you can call the unit and asked to speak with the hospitalist on call if the hospitalist that took care of you is not available. Once you are discharged, your primary care physician will handle any further medical issues. Please note that NO REFILLS for any discharge medications will be authorized once you are discharged, as it is imperative that you return to your primary care physician (or establish a relationship with a primary care physician if you do not have one) for your aftercare needs so that they can reassess your need for medications and monitor your lab values.        Discharge Medications     Allergies as of 01/15/2019      Reactions   Aricept [donepezil Hcl] Other (See Comments)   Nightmares, near syncope, weak, decreased appetite.   Lisinopril Other (See Comments)   Hair loss   Sulfa Antibiotics Nausea Only   Sulfasalazine Nausea Only      Medication List    STOP taking these medications   amLODipine 5 MG tablet Commonly known as:  NORVASC   hydrochlorothiazide 25 MG tablet Commonly known as:  HYDRODIURIL   losartan 25 MG tablet Commonly known as:  COZAAR     TAKE these medications   aspirin 81 MG tablet Take 81 mg by mouth daily.   atenolol 25 MG tablet Commonly known as:  TENORMIN Take 0.5 tablets (12.5 mg total) by mouth daily.   fluticasone 50 MCG/ACT nasal spray Commonly known as:  FLONASE Place 2 sprays into both nostrils as needed for allergies or rhinitis.   methimazole 5 MG tablet Commonly known as:  TAPAZOLE Take 1 tablet (5 mg total) by  mouth daily.   nitroGLYCERIN 0.4 MG SL tablet Commonly known as:  NITROSTAT Place 1 tablet (0.4 mg total) under the tongue every 5 (five) minutes as needed for chest pain.   pravastatin 10 MG tablet Commonly known as:  PRAVACHOL TAKE 1 TABLET BY MOUTH ONCE DAILY   Vitamin D (Ergocalciferol) 1.25 MG (50000 UT) Caps capsule Commonly known as:  DRISDOL Take 1 capsule (50,000 Units total) by mouth every 7 (seven) days.       Major procedures and Radiology Reports - PLEASE review detailed and final reports for all details, in brief -      Dg Chest 2 View  Result Date: 01/13/2019 CLINICAL DATA:  85 y/o F; weakness and shortness of breath with exertion for 1 week. Syncopal episode this morning. EXAM: CHEST - 2 VIEW COMPARISON:  11/27/2018 chest radiograph FINDINGS: Normal cardiac silhouette. Aortic atherosclerosis with calcification. Clear lungs. No pleural effusion or pneumothorax. No acute osseous abnormality is evident. Bilateral shoulder arthroplasty noted. IMPRESSION: No acute pulmonary process identified. Electronically Signed   By: Kristine Garbe M.D.   On: 01/13/2019 22:16    Micro Results    No results found for this or any previous visit (from the past 240 hour(s)).     Today   Subjective    Valerie [name] today states that she feels somewhat better and is ready to go home.  Denied having any difficulty swallowing, chest pain, or palpitations overnight.  Discussed with patient's daughter [name].  Daughter notes that she manages her mother's medications.   Objective   Blood pressure (!) 131/55, pulse 65, temperature 98.5 F (36.9 C), temperature source Oral, resp. rate 18, height 5\' 4"  (1.626 m), weight 49.2 kg, SpO2 98 %.   Intake/Output Summary (Last 24 hours) at  01/15/2019 0954 Last data filed at 01/15/2019 0521 Gross per 24 hour  Intake 1782.33 ml  Output 500 ml  Net 1282.33 ml    Exam  Constitutional: Elderly female in NAD, calm, comfortable Eyes:  PERRL, lids and conjunctivae normal ENMT: Mucous membranes are moist. Posterior pharynx clear of any exudate or lesions.Normal dentition.  Neck: normal, supple, possible thyroid enlargement Respiratory: clear to auscultation bilaterally, no wheezing, no crackles. Normal respiratory effort. No accessory muscle use.  Cardiovascular: Regular rate and rhythm, no murmurs / rubs / gallops. No extremity edema. 2+ pedal pulses. No carotid bruits.  Abdomen: no tenderness, no masses palpated. No hepatosplenomegaly. Bowel sounds positive.  Musculoskeletal: no clubbing / cyanosis. No joint deformity upper and lower extremities. Good ROM, no contractures. Normal muscle tone.  Skin: no rashes, lesions, ulcers. No induration Neurologic: CN 2-12 grossly intact. Sensation intact, DTR normal. Strength 4+/5 in all 4.  Psychiatric: Normal judgment and insight. Alert and oriented x 3. Normal mood.    Data Review   CBC w Diff:  Lab Results  Component Value Date   WBC 6.3 01/14/2019   HGB 11.1 (L) 01/14/2019   HGB 13.3 08/11/2018   HCT 32.7 (L) 01/14/2019   HCT 37.7 08/11/2018   PLT 291 01/14/2019   PLT 355 08/11/2018   LYMPHOPCT 30 08/25/2017   MONOPCT 7 08/25/2017   EOSPCT 2 08/25/2017   BASOPCT 0 08/25/2017    CMP:  Lab Results  Component Value Date   NA 140 01/15/2019   NA 141 08/11/2018   K 3.7 01/15/2019   CL 109 01/15/2019   CO2 22 01/15/2019   BUN 20 01/15/2019   BUN 31 (H) 08/11/2018   CREATININE 0.69 01/15/2019   CREATININE 0.80 07/10/2016   PROT 5.5 (L) 01/14/2019   PROT 7.1 03/09/2018   ALBUMIN 3.0 (L) 01/14/2019   ALBUMIN 4.6 03/09/2018   BILITOT 0.8 01/14/2019   BILITOT 0.4 03/09/2018   ALKPHOS 61 01/14/2019   AST 19 01/14/2019   ALT 14 01/14/2019  .   Total Time in preparing paper work, data evaluation and todays exam - 35 minutes  Norval Morton M.D on 01/15/2019 at 9:54 AM  [facility]   Office  [phone]

## 2019-01-15 NOTE — Evaluation (Signed)
Occupational Therapy Evaluation Patient Details Name: Valerie [name] MRN: [id number] DOB: [birth year] Today's Date: 01/15/2019    History of Present Illness Patient is an 85 year old female who presented to the hosptial with weakness and syncope. She reported the syncope increased over the course of the day yesterday. At baseline she has right foot pain which effects her mobility. PMH: UTI, kidney stones, HTN, depression,, collar bone fx, arthritis , anxiety, broken ribs    Clinical Impression   PTA patient independent with mobility and ADLs.  Admitted for above and limited by generalized weakness and decreased activity tolerance.  Pt educated energy conservation and fall prevention, discussed recommendation of [place] for shower due to decreased balance with LB bathing--pt reports having 3:1 commode at home and she will see if it will fit.  Patient able to complete LB ADLs with min guard to supervision, in room mobility with min guard to supervision for safety.  Plans to dc home with grandsons support.  No further OT needs have been identified. OT signing off.     Follow Up Recommendations  No OT follow up;Supervision - Intermittent    Equipment Recommendations  Tub/shower seat    Recommendations for Other Services       Precautions / Restrictions Precautions Precautions: None Restrictions Weight Bearing Restrictions: No      Mobility Bed Mobility               General bed mobility comments: OOB upon entry  Transfers Overall transfer level: Needs assistance Equipment used: None Transfers: Sit to/from Stand Sit to Stand: Supervision         General transfer comment: supervision for safety    Balance Overall balance assessment: Needs assistance Sitting-balance support: No upper extremity supported Sitting balance-Leahy Scale: Good     Standing balance support: During functional activity;Single extremity supported Standing balance-Leahy Scale: Good Standing balance  comment: min guard for safety at times with LB ADls in standing                            ADL either performed or assessed with clinical judgement   ADL Overall ADL's : Needs assistance/impaired     Grooming: Supervision/safety;Standing;Wash/dry hands       Lower Body Bathing: Sit to/from stand;Min guard Lower Body Bathing Details (indicate cue type and reason): reviewed bathing seated, options for [place], and min guard when standing to wash feet Upper Body Dressing : Sitting;Modified independent   Lower Body Dressing: Supervision/safety;Sit to/from stand   Toilet Transfer: Min guard;Ambulation       Tub/ Shower Transfer: Walk-in shower;Min guard;Ambulation     General ADL Comments: limited by impaired balance and decreased activity tolerance      Vision         Perception     Praxis      Pertinent Vitals/Pain Pain Assessment: No/denies pain     Hand Dominance     Extremity/Trunk Assessment Upper Extremity Assessment Upper Extremity Assessment: Generalized weakness   Lower Extremity Assessment Lower Extremity Assessment: Defer to PT evaluation       Communication Communication Communication: No difficulties   Cognition Arousal/Alertness: Awake/alert Behavior During Therapy: WFL for tasks assessed/performed Overall Cognitive Status: Within Functional Limits for tasks assessed  General Comments  discussed energy conservation and fall prevention     Exercises     Shoulder Instructions      Home Living Family/patient expects to be discharged to:: Private residence Living Arrangements: Other (Comment) Available Help at Discharge: Family(grandson ) Type of Home: House Home Access: Stairs to enter [employer] of Steps: 2   Home Layout: One level     Bathroom Shower/Tub: Occupational psychologist: Standard     Home Equipment: Cane - single point;Walker - 2  wheels;Bedside commode          Prior Functioning/Environment Level of Independence: Independent        Comments: independent with ADLs, mobility and IADLs         OT Problem List: Decreased strength;Decreased activity tolerance;Impaired balance (sitting and/or standing);Decreased safety awareness;Decreased knowledge of use of DME or AE      OT Treatment/Interventions:      OT Goals(Current goals can be found in the care plan section) Acute Rehab OT Goals Patient Stated Goal: to go home  OT Goal Formulation: With patient Time For Goal Achievement: 01/15/19  OT Frequency:     Barriers to D/C:            Co-evaluation              AM-PAC OT "6 Clicks" Daily Activity     Outcome Measure Help from another person eating meals?: None Help from another person taking care of personal grooming?: None Help from another person toileting, which includes using toliet, bedpan, or urinal?: None Help from another person bathing (including washing, rinsing, drying)?: A Little Help from another person to put on and taking off regular upper body clothing?: None Help from another person to put on and taking off regular lower body clothing?: None 6 Click Score: 23   End of Session Nurse Communication: Mobility status  Activity Tolerance: Patient tolerated treatment well Patient left: in chair;with call bell/phone within reach  OT Visit Diagnosis: Muscle weakness (generalized) (M62.81)                Time: 8921-1941 OT Time Calculation (min): 12 min Charges:  OT General Charges $OT Visit: 1 Visit OT Evaluation $OT Eval Low Complexity: 1 Low  Delight Stare, OT Acute Rehabilitation Services Pager [phone] Office [phone]   Delight Stare 01/15/2019, 12:47 PM

## 2019-01-15 NOTE — Plan of Care (Signed)
  Problem: Education: Goal: Knowledge of General Education information will improve Description Including pain rating scale, medication(s)/side effects and non-pharmacologic comfort measures Outcome: Completed/Met   Problem: Health Behavior/Discharge Planning: Goal: Ability to manage health-related needs will improve Outcome: Completed/Met   Problem: Clinical Measurements: Goal: Ability to maintain clinical measurements within normal limits will improve Outcome: Completed/Met Goal: Diagnostic test results will improve Outcome: Completed/Met   Problem: Activity: Goal: Risk for activity intolerance will decrease Outcome: Completed/Met   Problem: Nutrition: Goal: Adequate nutrition will be maintained Outcome: Completed/Met   Problem: Elimination: Goal: Will not experience complications related to bowel motility Outcome: Completed/Met   Problem: Pain Managment: Goal: General experience of comfort will improve Outcome: Completed/Met   Problem: Skin Integrity: Goal: Risk for impaired skin integrity will decrease Outcome: Completed/Met

## 2019-01-16 LAB — T3, FREE: T3, Free: 5.3 pg/mL — ABNORMAL HIGH (ref 2.0–4.4)

## 2019-01-17 ENCOUNTER — Telehealth: Payer: Self-pay | Admitting: Cardiovascular Disease

## 2019-01-17 NOTE — Telephone Encounter (Signed)
Spoke with patient who confirmed all demographics. Currently uses My Chart. Will have vitals ready for visit. She will be using her son's cell phone as he will be with her for her appointment.

## 2019-01-23 ENCOUNTER — Encounter: Payer: Self-pay | Admitting: Internal Medicine

## 2019-01-23 ENCOUNTER — Telehealth (INDEPENDENT_AMBULATORY_CARE_PROVIDER_SITE_OTHER): Payer: Medicare Other | Admitting: Cardiovascular Disease

## 2019-01-23 ENCOUNTER — Encounter: Payer: Self-pay | Admitting: Cardiovascular Disease

## 2019-01-23 ENCOUNTER — Encounter: Payer: Medicare Other | Admitting: Internal Medicine

## 2019-01-23 ENCOUNTER — Other Ambulatory Visit: Payer: Self-pay

## 2019-01-23 VITALS — BP 160/64 | HR 62 | Ht 64.0 in | Wt 110.0 lb

## 2019-01-23 DIAGNOSIS — R55 Syncope and collapse: Secondary | ICD-10-CM

## 2019-01-23 DIAGNOSIS — Z7189 Other specified counseling: Secondary | ICD-10-CM | POA: Diagnosis not present

## 2019-01-23 MED ORDER — ATENOLOL 25 MG PO TABS: 12.5000 mg | ORAL_TABLET | Freq: Every day | ORAL | 3 refills | Status: DC

## 2019-01-23 NOTE — Progress Notes (Signed)
Virtual Visit via Video Note   This visit type was conducted due to national recommendations for restrictions regarding the COVID-19 Pandemic (e.g. social distancing) in an effort to limit this patient's exposure and mitigate transmission in our community.  Due to her co-morbid illnesses, this patient is at least at moderate risk for complications without adequate follow up.  This format is felt to be most appropriate for this patient at this time.  All issues noted in this document were discussed and addressed.  A limited physical exam was performed with this format.  Please refer to the patient's chart for her consent to telehealth for [employer]   Evaluation Performed:  Follow-up visit  Date:  01/23/2019   ID:  CRYSTELLE [name], DOB [birth year], MRN [id number]  Patient Location: Home Provider Location: Office  PCP:  System, Pcp Not In  Cardiologist:  Mertie Moores, MD  Electrophysiologist:  None   Chief Complaint:  Near syncope   History of Present Illness:    Valerie [name] is a 85 y.o. female with a history of bradycardia.  She been in the emergency room an episode of near syncope.  She also has a history of coronary artery disease.  She has a long history.  She she was seen in the emergency room on April 17 with an episode of lightheadedness.  This occurred shortly after she got up from resting.  She went to the kitchen and felt weak and slumped down to the floor.  She states that she never did lose consciousness.  She admits that she took a sublingual nitroglycerin shortly before this episode. She took a SL NTG for some episodes of chest pain  In there hospital   Discontinue losartan, amlodipine, and hydrochlorothiazide.  Patient was started on atenolol 12.5 mg daily for hyperthyroidism symptoms.  BP has ranged from 145 -166    Was found to have a low TSH. Goes to [facility] .      The patient does not have symptoms concerning for COVID-19 infection (fever,  chills, cough, or new shortness of breath).   Does not go out    Past Medical History:  Diagnosis Date   Anxiety    Arthritis    Broken ribs    Carotid artery disease ([place])    Cataract    Collar bone fracture    Right   Depression    Diverticulosis of colon with hemorrhage    Duodenitis    Esophageal reflux    Esophageal stricture    Essential hypertension    Glaucoma    Hiatal hernia    History of kidney stones     x1    Hyperlipidemia    IBS (irritable bowel syndrome)    Palpitations    UTI (urinary tract infection) March 2015   Past Surgical History:  Procedure Laterality Date   CHOLECYSTECTOMY     COLONOSCOPY     DILATION AND CURETTAGE OF UTERUS     EYE SURGERY Bilateral    total of 5   LASER PHOTO ABLATION Right 10/20/2016   Procedure: LASER PHOTO ABLATION;  Surgeon: Hayden Pedro, MD;  Location: [place];  Service: Ophthalmology;  Laterality: Right;  Headscope laser   LEFT ROTATOR CUFF REPAIR X2 Left    PARS PLANA VITRECTOMY  10/20/2016   WITH 25G REMOVAL/SUTURE SECONDARY INTRAOCULAR LENS, GAS FLUID EXCHANGE    PARS PLANA VITRECTOMY Right 10/20/2016   Procedure: PARS PLANA VITRECTOMY WITH 25G REMOVAL/SUTURE SECONDARY INTRAOCULAR LENS,  GAS FLUID EXCHANGE;  Surgeon: Hayden Pedro, MD;  Location: [place];  Service: Ophthalmology;  Laterality: Right;   RIGHT ROTATOR CUFF REPAIR X1 Right    UPPER GASTROINTESTINAL ENDOSCOPY     with dilation     Current Meds  Medication Sig   aspirin 81 MG tablet Take 81 mg by mouth daily.     atenolol (TENORMIN) 25 MG tablet Take 0.5 tablets (12.5 mg total) by mouth daily.   fluticasone (FLONASE) 50 MCG/ACT nasal spray Place 2 sprays into both nostrils as needed for allergies or rhinitis.   methimazole (TAPAZOLE) 5 MG tablet Take 1 tablet (5 mg total) by mouth daily.   nitroGLYCERIN (NITROSTAT) 0.4 MG SL tablet Place 1 tablet (0.4 mg total) under the tongue every 5 (five) minutes as needed for chest  pain.   pravastatin (PRAVACHOL) 10 MG tablet TAKE 1 TABLET BY MOUTH ONCE DAILY   Vitamin D, Ergocalciferol, (DRISDOL) 50000 units CAPS capsule Take 1 capsule (50,000 Units total) by mouth every 7 (seven) days.     Allergies:   Aricept [donepezil hcl]; Lisinopril; Sulfa antibiotics; and Sulfasalazine   Social History   Tobacco Use   Smoking status: Never Smoker   Smokeless tobacco: Never Used  Substance Use Topics   Alcohol use: No   Drug use: No     Family Hx: The patient's family history includes Bladder Cancer in her brother; Colon cancer (age of onset: 38) in her father; Kidney cancer in her brother; Leukemia in her mother; Stroke in her sister. There is no history of Esophageal cancer or Stomach cancer.  ROS:   Please see the history of present illness.     All other systems reviewed and are negative.   Prior CV studies:   The following studies were reviewed today:    Labs/Other Tests and Data Reviewed:    EKG:  An ECG dated 01/13/19  was personally reviewed today and demonstrated:  NSR at 87.  no ST or T wave changes   Recent Labs: 08/11/2018: Magnesium 2.2 01/14/2019: ALT 14; Hemoglobin 11.1; Platelets 291; TSH 0.179 01/15/2019: BUN 20; Creatinine, Ser 0.69; Potassium 3.7; Sodium 140   Recent Lipid Panel Lab Results  Component Value Date/Time   CHOL 186 03/09/2018 10:22 AM   TRIG 156 (H) 03/09/2018 10:22 AM   HDL 51 03/09/2018 10:22 AM   CHOLHDL 3.6 03/09/2018 10:22 AM   CHOLHDL 4 12/05/2014 09:46 AM   LDLCALC 104 (H) 03/09/2018 10:22 AM    Wt Readings from Last 3 Encounters:  01/23/19 110 lb (49.9 kg)  01/15/19 108 lb 8 oz (49.2 kg)  09/07/18 105 lb (47.6 kg)     Objective:    Vital Signs:  BP (!) 160/64 (BP Location: Left Arm, Patient Position: Sitting, Cuff Size: Normal)    Pulse 62    Ht 5\' 4"  (1.626 m)    Wt 110 lb (49.9 kg)    BMI 18.88 kg/m    VITAL SIGNS:  reviewed GEN:  no acute distress EYES:  sclerae anicteric, EOMI - Extraocular  Movements Intact RESPIRATORY:  normal respiratory effort, symmetric expansion CARDIOVASCULAR:  no peripheral edema SKIN:  no rash, lesions or ulcers. MUSCULOSKELETAL:  no obvious deformities. NEURO:  alert and oriented x 3, no obvious focal deficit PSYCH:  normal affect  ASSESSMENT & PLAN:    1. Near syncope :   Likely caused by orthostasis she had taken nitroglycerin shortly before she passed out.  She has a history of near syncope  in the past. The losartan, amlodipine, hydrochlorothiazide were discontinued.  She was started on atenolol once a day for hyperthyroidism symptoms.  Had any further episodes of presyncope since that time.  I will see her again in 6 months.  2. Hyperthyroidism: She started on methimazole 5 mg and atenolol 12.5 mg a day.  She will be visited with the endocrinologist later today.  COVID-19 Education: The signs and symptoms of COVID-19 were discussed with the patient and how to seek care for testing (follow up with PCP or arrange E-visit).  The importance of social distancing was discussed today.  Time:   Today, I have spent  20  minutes with the patient with telehealth technology discussing the above problems.   Spent additional 12 minutes charting  Medication Adjustments/Labs and Tests Ordered: Current medicines are reviewed at length with the patient today.  Concerns regarding medicines are outlined above.   Tests Ordered: No orders of the defined types were placed in this encounter.   Medication Changes: No orders of the defined types were placed in this encounter.   Disposition:  Follow up in 6 month(s)  Signed, Mertie Moores, MD  01/23/2019 8:30 AM    [facility]

## 2019-01-23 NOTE — Progress Notes (Deleted)
Virtual Visit via Video Note  I connected with Valerie [name] 01/23/19 at  3:00 PM EDT by a video enabled telemedicine application and verified that I am speaking with the correct person using two identifiers.   I discussed the limitations of evaluation and management by telemedicine and the availability of in person appointments. The patient expressed understanding and agreed to proceed.  -Location of the patient : -Location of the provider : office -The names of all persons participating in the telemedicine service : Pt and myself, daughter      Name: Valerie [name]  MRN/ DOB: [id number], [birth year]    Age/ Sex: 85 y.o., female    PCP: System, Pcp Not In   Reason for Endocrinology Evaluation: Hyperthyroidism      Date of Initial Endocrinology Evaluation: 01/23/2019     HPI: [name] is a 85 y.o. female with a past medical history of HTN, Hyperlipidemia and carotid artery stenosis. The patient presented for initial endocrinology clinic visit on 01/23/2019 for consultative assistance with her Hyperthyroidism.  Pt was noted to have hyperthyroidism with a TSH of 0.179 uIu/mL anf elevated T3 and FT4 during presentation to the ED with near syncope and palpitations on 01/13/2019.    HISTORY:  Past Medical History:  Past Medical History:  Diagnosis Date   Anxiety    Arthritis    Broken ribs    Carotid artery disease ([place])    Cataract    Collar bone fracture    Right   Depression    Diverticulosis of colon with hemorrhage    Duodenitis    Esophageal reflux    Esophageal stricture    Essential hypertension    Glaucoma    Hiatal hernia    History of kidney stones     x1    Hyperlipidemia    IBS (irritable bowel syndrome)    Palpitations    UTI (urinary tract infection) March 2015   Past Surgical History:  Past Surgical History:  Procedure Laterality Date   CHOLECYSTECTOMY     COLONOSCOPY     DILATION AND CURETTAGE OF UTERUS     EYE SURGERY  Bilateral    total of 5   LASER PHOTO ABLATION Right 10/20/2016   Procedure: LASER PHOTO ABLATION;  Surgeon: Hayden Pedro, MD;  Location: [place];  Service: Ophthalmology;  Laterality: Right;  Headscope laser   LEFT ROTATOR CUFF REPAIR X2 Left    PARS PLANA VITRECTOMY  10/20/2016   WITH 25G REMOVAL/SUTURE SECONDARY INTRAOCULAR LENS, GAS FLUID EXCHANGE    PARS PLANA VITRECTOMY Right 10/20/2016   Procedure: PARS PLANA VITRECTOMY WITH 25G REMOVAL/SUTURE SECONDARY INTRAOCULAR LENS, GAS FLUID EXCHANGE;  Surgeon: Hayden Pedro, MD;  Location: [place];  Service: Ophthalmology;  Laterality: Right;   RIGHT ROTATOR CUFF REPAIR X1 Right    UPPER GASTROINTESTINAL ENDOSCOPY     with dilation      Social History:  reports that she has never smoked. She has never used smokeless tobacco. She reports that she does not drink alcohol or use drugs.  Family History: family history includes Bladder Cancer in her brother; Colon cancer (age of onset: 34) in her father; Kidney cancer in her brother; Leukemia in her mother; Stroke in her sister.   HOME MEDICATIONS: Allergies as of 01/23/2019      Reactions   Aricept [donepezil Hcl] Other (See Comments)   Nightmares, near syncope, weak, decreased appetite.   Lisinopril Other (See Comments)   Hair loss  Sulfa Antibiotics Nausea Only   Sulfasalazine Nausea Only      Medication List       Accurate as of January 23, 2019 12:57 PM. Always use your most recent med list.        aspirin 81 MG tablet Take 81 mg by mouth daily.   atenolol 25 MG tablet Commonly known as:  TENORMIN Take 0.5 tablets (12.5 mg total) by mouth daily.   fluticasone 50 MCG/ACT nasal spray Commonly known as:  FLONASE Place 2 sprays into both nostrils as needed for allergies or rhinitis.   methimazole 5 MG tablet Commonly known as:  TAPAZOLE Take 1 tablet (5 mg total) by mouth daily.   nitroGLYCERIN 0.4 MG SL tablet Commonly known as:  NITROSTAT Place 1 tablet (0.4 mg  total) under the tongue every 5 (five) minutes as needed for chest pain.   pravastatin 10 MG tablet Commonly known as:  PRAVACHOL TAKE 1 TABLET BY MOUTH ONCE DAILY   Vitamin D (Ergocalciferol) 1.25 MG (50000 UT) Caps capsule Commonly known as:  DRISDOL Take 1 capsule (50,000 Units total) by mouth every 7 (seven) days.         REVIEW OF SYSTEMS: A comprehensive ROS was conducted with the patient and is negative except as per HPI and below:  ROS      DATA REVIEWED:   Results for LYNE, KHURANA" (MRN [id number]) as of 01/23/2019 08:43  Ref. Range 01/14/2019 08:54 01/15/2019 04:57  TSH Latest Ref Range: 0.350 - 4.500 uIU/mL 0.179 (L)   Triiodothyronine,Free,Serum Latest Ref Range: 2.0 - 4.4 pg/mL  5.3 (H)  T4,Free(Direct) Latest Ref Range: 0.82 - 1.77 ng/dL 1.93 (H)   Results for SHAKERIA, ROBINETTE" (MRN [id number]) as of 01/23/2019 08:43  Ref. Range 06/11/2018 10:04  WBC Latest Ref Range: 3.4 - 10.8 x10E3/uL 8.6  RBC Latest Ref Range: 3.77 - 5.28 x10E6/uL 4.09  Hemoglobin Latest Ref Range: 11.1 - 15.9 g/dL 13.4  HCT Latest Ref Range: 34.0 - 46.6 % 40.4  MCV Latest Ref Range: 79 - 97 fL 99 (H)  MCH Latest Ref Range: 26.6 - 33.0 pg 32.8  MCHC Latest Ref Range: 31.5 - 35.7 g/dL 33.2  RDW Latest Ref Range: 12.3 - 15.4 % 12.5  Platelets Latest Ref Range: 150 - 450 x10E3/uL 340  Neutrophils Latest Ref Range: Not Estab. % 68  Immature Granulocytes Latest Ref Range: Not Estab. % 0  NEUT# Latest Ref Range: 1.4 - 7.0 x10E3/uL 5.9  Lymphocyte # Latest Ref Range: 0.7 - 3.1 x10E3/uL 1.9  Monocytes Absolute Latest Ref Range: 0.1 - 0.9 x10E3/uL 0.6  Basophils Absolute Latest Ref Range: 0.0 - 0.2 x10E3/uL 0.0  Immature Grans (Abs) Latest Ref Range: 0.0 - 0.1 x10E3/uL 0.0  Lymphs Latest Ref Range: Not Estab. % 22  Monocytes Latest Ref Range: Not Estab. % 7  Basos Latest Ref Range: Not Estab. % 1  Retic Ct Pct Latest Ref Range: 0.6 - 2.6 % 1.0  Eos Latest Ref Range: Not Estab.  % 2  EOS (ABSOLUTE) Latest Ref Range: 0.0 - 0.4 x10E3/uL 0.1   Results for SANDI, TOWE" (MRN [id number]) as of 01/23/2019 08:43  Ref. Range 01/14/2019 06:36  Alkaline Phosphatase Latest Ref Range: 38 - 126 U/L 61  Albumin Latest Ref Range: 3.5 - 5.0 g/dL 3.0 (L)  AST Latest Ref Range: 15 - 41 U/L 19  ALT Latest Ref Range: 0 - 44 U/L 14  Total Protein Latest  Ref Range: 6.5 - 8.1 g/dL 5.5 (L)  Bilirubin, Direct Latest Ref Range: 0.0 - 0.2 mg/dL <0.1  Indirect Bilirubin Latest Ref Range: 0.3 - 0.9 mg/dL NOT CALCULATED  Total Bilirubin Latest Ref Range: 0.3 - 1.2 mg/dL 0.8  GFR, Est Non African American Latest Ref Range: >60 mL/min >60  GFR, Est African American Latest Ref Range: >60 mL/min >60    Thyroid Ultrasound 06/06/2015 Predominately cystic appearing left thyroid nodule measuring 11 mm. Findings do not meet current SRU consensus criteria for biopsy. Follow-up by clinical exam is recommended.    ASSESSMENT/PLAN/RECOMMENDATIONS:   1. Hyperthyroidism :    Medications :    I discussed the assessment and treatment plan with the patient. The patient was provided an opportunity to ask questions and all were answered. The patient agreed with the plan and demonstrated an understanding of the instructions.   The patient was advised to call back or seek an in-person evaluation if the symptoms worsen or if the condition fails to improve as anticipated.  I provided *** minutes of non-face-to-face time during this encounter.   Signed electronically by: Mack Guise, MD  [facility] Endocrinology  [facility] [address] [place], [place] [address] Phone: [phone] FAX: [phone]   CC: System, Pcp Not In No address on file Phone: None Fax: None   Return to Endocrinology clinic as below: Future Appointments  Date Time Provider [place]  01/23/2019  3:00 PM Rossanna Spitzley, Melanie Crazier, MD [facility] None

## 2019-01-23 NOTE — Patient Instructions (Signed)
Medication Instructions:  Your physician recommends that you continue on your current medications as directed. Please refer to the Current Medication list given to you today. *A refill of your Atenolol has been sent to your pharmacy   Lab work: None Ordered    Testing/Procedures: None Ordered   Follow-Up: At [facility], you and your health needs are our priority.  As part of our continuing mission to provide you with exceptional heart care, we have created designated Provider Care Teams.  These Care Teams include your primary Cardiologist (physician) and Advanced Practice Providers (APPs -  Physician Assistants and Nurse Practitioners) who all work together to provide you with the care you need, when you need it. You will need a follow up appointment in:  6 months.  Please call our office 2 months in advance to schedule this appointment.  You may see Mertie Moores, MD or one of the following Advanced Practice Providers on your designated Care Team: Richardson Dopp, PA-C [place], [place] . Daune Perch, NP

## 2019-01-23 NOTE — Progress Notes (Deleted)
Virtual Visit via Video Note  I connected with Valerie [name] 01/23/19 at 10:10 AM EDT by a video enabled telemedicine application and verified that I am speaking with the correct person using two identifiers.   I discussed the limitations of evaluation and management by telemedicine and the availability of in person appointments. The patient expressed understanding and agreed to proceed.  -Location of the patient : -Location of the provider : office -The names of all persons participating in the telemedicine service : Pt and myself, daughter      Name: Valerie [name]  MRN/ DOB: [id number], 11-16-1927    Age/ Sex: 85 y.o., female    PCP: System, Pcp Not In   Reason for Endocrinology Evaluation: Hyperthyroidism      Date of Initial Endocrinology Evaluation: 01/23/2019     HPI: [name] is a 85 y.o. female with a past medical history of HTN, Hyperlipidemia and carotid artery stenosis. The patient presented for initial endocrinology clinic visit on 01/23/2019 for consultative assistance with her Hyperthyroidism.  Pt was noted to have hyperthyroidism with a TSH of 0.179 uIu/mL anf elevated T3 and FT4 during presentation to the ED with near syncope and palpitations on 01/13/2019.    HISTORY:  Past Medical History:  Past Medical History:  Diagnosis Date   Anxiety    Arthritis    Broken ribs    Carotid artery disease ([place])    Cataract    Collar bone fracture    Right   Depression    Diverticulosis of colon with hemorrhage    Duodenitis    Esophageal reflux    Esophageal stricture    Essential hypertension    Glaucoma    Hiatal hernia    History of kidney stones     x1    Hyperlipidemia    IBS (irritable bowel syndrome)    Palpitations    UTI (urinary tract infection) March 2015    Past Surgical History:  Past Surgical History:  Procedure Laterality Date   CHOLECYSTECTOMY     COLONOSCOPY     DILATION AND CURETTAGE OF UTERUS     EYE SURGERY  Bilateral    total of 5   LASER PHOTO ABLATION Right 10/20/2016   Procedure: LASER PHOTO ABLATION;  Surgeon: Hayden Pedro, MD;  Location: [place];  Service: Ophthalmology;  Laterality: Right;  Headscope laser   LEFT ROTATOR CUFF REPAIR X2 Left    PARS PLANA VITRECTOMY  10/20/2016   WITH 25G REMOVAL/SUTURE SECONDARY INTRAOCULAR LENS, GAS FLUID EXCHANGE    PARS PLANA VITRECTOMY Right 10/20/2016   Procedure: PARS PLANA VITRECTOMY WITH 25G REMOVAL/SUTURE SECONDARY INTRAOCULAR LENS, GAS FLUID EXCHANGE;  Surgeon: Hayden Pedro, MD;  Location: [place];  Service: Ophthalmology;  Laterality: Right;   RIGHT ROTATOR CUFF REPAIR X1 Right    UPPER GASTROINTESTINAL ENDOSCOPY     with dilation      Social History:  reports that she has never smoked. She has never used smokeless tobacco. She reports that she does not drink alcohol or use drugs.  Family History: family history includes Bladder Cancer in her brother; Colon cancer (age of onset: 64) in her father; Kidney cancer in her brother; Leukemia in her mother; Stroke in her sister.   HOME MEDICATIONS: Allergies as of 01/23/2019      Reactions   Aricept [donepezil Hcl] Other (See Comments)   Nightmares, near syncope, weak, decreased appetite.   Lisinopril Other (See Comments)   Hair loss  Sulfa Antibiotics Nausea Only   Sulfasalazine Nausea Only      Medication List       Accurate as of January 23, 2019  8:42 AM. Always use your most recent med list.        aspirin 81 MG tablet Take 81 mg by mouth daily.   atenolol 25 MG tablet Commonly known as:  TENORMIN Take 0.5 tablets (12.5 mg total) by mouth daily.   fluticasone 50 MCG/ACT nasal spray Commonly known as:  FLONASE Place 2 sprays into both nostrils as needed for allergies or rhinitis.   methimazole 5 MG tablet Commonly known as:  TAPAZOLE Take 1 tablet (5 mg total) by mouth daily.   nitroGLYCERIN 0.4 MG SL tablet Commonly known as:  NITROSTAT Place 1 tablet (0.4 mg  total) under the tongue every 5 (five) minutes as needed for chest pain.   pravastatin 10 MG tablet Commonly known as:  PRAVACHOL TAKE 1 TABLET BY MOUTH ONCE DAILY   Vitamin D (Ergocalciferol) 1.25 MG (50000 UT) Caps capsule Commonly known as:  DRISDOL Take 1 capsule (50,000 Units total) by mouth every 7 (seven) days.         REVIEW OF SYSTEMS: A comprehensive ROS was conducted with the patient and is negative except as per HPI and below:  ROS      DATA REVIEWED:   Results for DYNISHA, DUE" (MRN [id number]) as of 01/23/2019 08:43  Ref. Range 01/14/2019 08:54 01/15/2019 04:57  TSH Latest Ref Range: 0.350 - 4.500 uIU/mL 0.179 (L)   Triiodothyronine,Free,Serum Latest Ref Range: 2.0 - 4.4 pg/mL  5.3 (H)  T4,Free(Direct) Latest Ref Range: 0.82 - 1.77 ng/dL 1.93 (H)   Results for LEISHA, TRINKLE" (MRN [id number]) as of 01/23/2019 08:43  Ref. Range 06/11/2018 10:04  WBC Latest Ref Range: 3.4 - 10.8 x10E3/uL 8.6  RBC Latest Ref Range: 3.77 - 5.28 x10E6/uL 4.09  Hemoglobin Latest Ref Range: 11.1 - 15.9 g/dL 13.4  HCT Latest Ref Range: 34.0 - 46.6 % 40.4  MCV Latest Ref Range: 79 - 97 fL 99 (H)  MCH Latest Ref Range: 26.6 - 33.0 pg 32.8  MCHC Latest Ref Range: 31.5 - 35.7 g/dL 33.2  RDW Latest Ref Range: 12.3 - 15.4 % 12.5  Platelets Latest Ref Range: 150 - 450 x10E3/uL 340  Neutrophils Latest Ref Range: Not Estab. % 68  Immature Granulocytes Latest Ref Range: Not Estab. % 0  NEUT# Latest Ref Range: 1.4 - 7.0 x10E3/uL 5.9  Lymphocyte # Latest Ref Range: 0.7 - 3.1 x10E3/uL 1.9  Monocytes Absolute Latest Ref Range: 0.1 - 0.9 x10E3/uL 0.6  Basophils Absolute Latest Ref Range: 0.0 - 0.2 x10E3/uL 0.0  Immature Grans (Abs) Latest Ref Range: 0.0 - 0.1 x10E3/uL 0.0  Lymphs Latest Ref Range: Not Estab. % 22  Monocytes Latest Ref Range: Not Estab. % 7  Basos Latest Ref Range: Not Estab. % 1  Retic Ct Pct Latest Ref Range: 0.6 - 2.6 % 1.0  Eos Latest Ref Range: Not Estab.  % 2  EOS (ABSOLUTE) Latest Ref Range: 0.0 - 0.4 x10E3/uL 0.1   Results for LANIE, SCHELLING" (MRN [id number]) as of 01/23/2019 08:43  Ref. Range 01/14/2019 06:36  Alkaline Phosphatase Latest Ref Range: 38 - 126 U/L 61  Albumin Latest Ref Range: 3.5 - 5.0 g/dL 3.0 (L)  AST Latest Ref Range: 15 - 41 U/L 19  ALT Latest Ref Range: 0 - 44 U/L 14  Total Protein  Latest Ref Range: 6.5 - 8.1 g/dL 5.5 (L)  Bilirubin, Direct Latest Ref Range: 0.0 - 0.2 mg/dL <0.1  Indirect Bilirubin Latest Ref Range: 0.3 - 0.9 mg/dL NOT CALCULATED  Total Bilirubin Latest Ref Range: 0.3 - 1.2 mg/dL 0.8  GFR, Est Non African American Latest Ref Range: >60 mL/min >60  GFR, Est African American Latest Ref Range: >60 mL/min >60    Thyroid Ultrasound 06/06/2015 Predominately cystic appearing left thyroid nodule measuring 11 mm. Findings do not meet current SRU consensus criteria for biopsy. Follow-up by clinical exam is recommended.    ASSESSMENT/PLAN/RECOMMENDATIONS:   1. ***    Medications :    I discussed the assessment and treatment plan with the patient. The patient was provided an opportunity to ask questions and all were answered. The patient agreed with the plan and demonstrated an understanding of the instructions.   The patient was advised to call back or seek an in-person evaluation if the symptoms worsen or if the condition fails to improve as anticipated.  I provided *** minutes of non-face-to-face time during this encounter.   Signed electronically by: Mack Guise, MD  [facility] Endocrinology  [facility] [address] [place], [place] [address] Phone: [phone] FAX: [phone]   CC: System, Pcp Not In No address on file Phone: None Fax: None   Return to Endocrinology clinic as below: Future Appointments  Date Time Provider [place]  01/23/2019 10:10 AM Ladarryl Wrage, Melanie Crazier, MD [facility] None

## 2019-01-24 ENCOUNTER — Ambulatory Visit (INDEPENDENT_AMBULATORY_CARE_PROVIDER_SITE_OTHER): Payer: Medicare Other | Admitting: Internal Medicine

## 2019-01-24 ENCOUNTER — Encounter: Payer: Self-pay | Admitting: Internal Medicine

## 2019-01-24 DIAGNOSIS — E041 Nontoxic single thyroid nodule: Secondary | ICD-10-CM | POA: Insufficient documentation

## 2019-01-24 DIAGNOSIS — E0590 Thyrotoxicosis, unspecified without thyrotoxic crisis or storm: Secondary | ICD-10-CM | POA: Diagnosis not present

## 2019-01-24 MED ORDER — METHIMAZOLE 5 MG PO TABS: 5.0000 mg | ORAL_TABLET | Freq: Every day | ORAL | 2 refills | Status: DC

## 2019-01-24 NOTE — Progress Notes (Signed)
Virtual Visit via Video Note  I connected with Valerie [name] 01/24/19 at  2:00 PM EDT by a video enabled telemedicine application and verified that I am speaking with the correct person using two identifiers.   I discussed the limitations of evaluation and management by telemedicine and the availability of in person appointments. The patient expressed understanding and agreed to proceed.  -Location of the patient : -Location of the provider : office -The names of all persons participating in the telemedicine service : Pt and myself, son Claiborne Billings)     Name: Valerie [name]  MRN/ DOB: [id number], [birth year]    Age/ Sex: 85 y.o., female    PCP: System, Pcp Not In   Reason for Endocrinology Evaluation: Hyperthyroidism      Date of Initial Endocrinology Evaluation: 01/24/2019     HPI: Valerie [name] is a 85 y.o. female with a past medical history of HTN, Hyperlipidemia and carotid artery stenosis. The patient presented for initial endocrinology clinic visit on 01/24/2019 for consultative assistance with her Hyperthyroidism.  Pt was noted to have hyperthyroidism with a TSH of 0.179 uIu/mL anf elevated T3 and FT4 during presentation to the ED with near syncope and palpitations on 01/13/2019. She was started on atenolol and Methimazole at the time.   She has noted dizziness, palpitations and a general "unwell" sensation for ~ 2 months She believes she has lost weight but denies diarrhea, anxiety or jittery sensation  She denies any local neck symptoms.   She denies prior exposure to radiation  She has no FH of thyroid disease  As per son and the patient she has felt better since being on methimazole  She follows at  [facility] and would like to have labs there.     HISTORY:  Past Medical History:  Past Medical History:  Diagnosis Date  . Anxiety   . Arthritis   . Broken ribs   . Carotid artery disease ([place])   . Cataract   . Collar bone fracture    Right  .  Depression   . Diverticulosis of colon with hemorrhage   . Duodenitis   . Esophageal reflux   . Esophageal stricture   . Essential hypertension   . Glaucoma   . Hiatal hernia   . History of kidney stones     x1   . Hyperlipidemia   . IBS (irritable bowel syndrome)   . Palpitations   . UTI (urinary tract infection) March 2015   Past Surgical History:  Past Surgical History:  Procedure Laterality Date  . CHOLECYSTECTOMY    . COLONOSCOPY    . DILATION AND CURETTAGE OF UTERUS    . EYE SURGERY Bilateral    total of 5  . LASER PHOTO ABLATION Right 10/20/2016   Procedure: LASER PHOTO ABLATION;  Surgeon: Hayden Pedro, MD;  Location: [place];  Service: Ophthalmology;  Laterality: Right;  Headscope laser  . LEFT ROTATOR CUFF REPAIR X2 Left   . PARS PLANA VITRECTOMY  10/20/2016   WITH 25G REMOVAL/SUTURE SECONDARY INTRAOCULAR LENS, GAS FLUID EXCHANGE   . PARS PLANA VITRECTOMY Right 10/20/2016   Procedure: PARS PLANA VITRECTOMY WITH 25G REMOVAL/SUTURE SECONDARY INTRAOCULAR LENS, GAS FLUID EXCHANGE;  Surgeon: Hayden Pedro, MD;  Location: [place];  Service: Ophthalmology;  Laterality: Right;  . RIGHT ROTATOR CUFF REPAIR X1 Right   . UPPER GASTROINTESTINAL ENDOSCOPY     with dilation      Social History:  reports that  she has never smoked. She has never used smokeless tobacco. She reports that she does not drink alcohol or use drugs.  Family History: family history includes Bladder Cancer in her brother; Colon cancer (age of onset: 40) in her father; Kidney cancer in her brother; Leukemia in her mother; Stroke in her sister.   HOME MEDICATIONS: Allergies as of 01/24/2019      Reactions   Aricept [donepezil Hcl] Other (See Comments)   Nightmares, near syncope, weak, decreased appetite.   Lisinopril Other (See Comments)   Hair loss   Sulfa Antibiotics Nausea Only   Sulfasalazine Nausea Only      Medication List       Accurate as of January 24, 2019  1:58 PM. Always use your most  recent med list.        aspirin 81 MG tablet Take 81 mg by mouth daily.   atenolol 25 MG tablet Commonly known as:  TENORMIN Take 0.5 tablets (12.5 mg total) by mouth daily.   fluticasone 50 MCG/ACT nasal spray Commonly known as:  FLONASE Place 2 sprays into both nostrils as needed for allergies or rhinitis.   methimazole 5 MG tablet Commonly known as:  TAPAZOLE Take 1 tablet (5 mg total) by mouth daily.   nitroGLYCERIN 0.4 MG SL tablet Commonly known as:  NITROSTAT Place 1 tablet (0.4 mg total) under the tongue every 5 (five) minutes as needed for chest pain.   pravastatin 10 MG tablet Commonly known as:  PRAVACHOL TAKE 1 TABLET BY MOUTH ONCE DAILY   Vitamin D (Ergocalciferol) 1.25 MG (50000 UT) Caps capsule Commonly known as:  DRISDOL Take 1 capsule (50,000 Units total) by mouth every 7 (seven) days.         REVIEW OF SYSTEMS: A comprehensive ROS was conducted with the patient and is negative except as per HPI and below:  Review of Systems  Constitutional: Positive for malaise/fatigue. Negative for weight loss.  HENT: Negative for congestion and sore throat.   Eyes: Negative for blurred vision and pain.  Respiratory: Negative for cough and shortness of breath.   Cardiovascular: Positive for palpitations. Negative for chest pain.  Gastrointestinal: Negative for diarrhea and nausea.  Genitourinary: Negative for frequency.  Neurological: Positive for dizziness. Negative for tremors.  Psychiatric/Behavioral: Negative for depression. The patient is not nervous/anxious.         DATA REVIEWED:   Results for DENYA, BUCKINGHAM" (MRN [id number]) as of 01/23/2019 08:43  Ref. Range 01/14/2019 08:54 01/15/2019 04:57  TSH Latest Ref Range: 0.350 - 4.500 uIU/mL 0.179 (L)   Triiodothyronine,Free,Serum Latest Ref Range: 2.0 - 4.4 pg/mL  5.3 (H)  T4,Free(Direct) Latest Ref Range: 0.82 - 1.77 ng/dL 1.93 (H)   Results for FAY, BAGG" (MRN [id number]) as of  01/23/2019 08:43  Ref. Range 06/11/2018 10:04  WBC Latest Ref Range: 3.4 - 10.8 x10E3/uL 8.6  RBC Latest Ref Range: 3.77 - 5.28 x10E6/uL 4.09  Hemoglobin Latest Ref Range: 11.1 - 15.9 g/dL 13.4  HCT Latest Ref Range: 34.0 - 46.6 % 40.4  MCV Latest Ref Range: 79 - 97 fL 99 (H)  MCH Latest Ref Range: 26.6 - 33.0 pg 32.8  MCHC Latest Ref Range: 31.5 - 35.7 g/dL 33.2  RDW Latest Ref Range: 12.3 - 15.4 % 12.5  Platelets Latest Ref Range: 150 - 450 x10E3/uL 340  Neutrophils Latest Ref Range: Not Estab. % 68  Immature Granulocytes Latest Ref Range: Not Estab. % 0  NEUT# Latest  Ref Range: 1.4 - 7.0 x10E3/uL 5.9  Lymphocyte # Latest Ref Range: 0.7 - 3.1 x10E3/uL 1.9  Monocytes Absolute Latest Ref Range: 0.1 - 0.9 x10E3/uL 0.6  Basophils Absolute Latest Ref Range: 0.0 - 0.2 x10E3/uL 0.0  Immature Grans (Abs) Latest Ref Range: 0.0 - 0.1 x10E3/uL 0.0  Lymphs Latest Ref Range: Not Estab. % 22  Monocytes Latest Ref Range: Not Estab. % 7  Basos Latest Ref Range: Not Estab. % 1  Retic Ct Pct Latest Ref Range: 0.6 - 2.6 % 1.0  Eos Latest Ref Range: Not Estab. % 2  EOS (ABSOLUTE) Latest Ref Range: 0.0 - 0.4 x10E3/uL 0.1   Results for CIANNI, MANNY" (MRN [id number]) as of 01/23/2019 08:43  Ref. Range 01/14/2019 06:36  Alkaline Phosphatase Latest Ref Range: 38 - 126 U/L 61  Albumin Latest Ref Range: 3.5 - 5.0 g/dL 3.0 (L)  AST Latest Ref Range: 15 - 41 U/L 19  ALT Latest Ref Range: 0 - 44 U/L 14  Total Protein Latest Ref Range: 6.5 - 8.1 g/dL 5.5 (L)  Bilirubin, Direct Latest Ref Range: 0.0 - 0.2 mg/dL <0.1  Indirect Bilirubin Latest Ref Range: 0.3 - 0.9 mg/dL NOT CALCULATED  Total Bilirubin Latest Ref Range: 0.3 - 1.2 mg/dL 0.8  GFR, Est Non African American Latest Ref Range: >60 mL/min >60  GFR, Est African American Latest Ref Range: >60 mL/min >60    Thyroid Ultrasound 06/06/2015 Predominately cystic appearing left thyroid nodule measuring 11 mm. Findings do not meet current SRU  consensus criteria for biopsy. Follow-up by clinical exam is recommended.    ASSESSMENT/PLAN/RECOMMENDATIONS:   1. Hyperthyroidism :  - Clinically she is euthyroid - She already has been feeling better on Methimazole with no side effects - We did discuss D/D of graves' disease, toxic nodule (s) vs thyroiditis.  - I am leaning towards toxic nodule given prior evidence of a thyroid nodule on an ultrasound from 2016.  - Will obtain TRAb levels, if negative will proceed with uptake and scan  -We discussed with pt the benefits of methimazole in the Tx of hyperthyroidism, as well as the possible side effects/complications of anti-thyroid drug Tx (specifically detailing the rare, but serious side effect of agranulocytosis). She was informed of need for regular thyroid function monitoring while on methimazole to ensure appropriate dosage without over-treatment. As well, we discussed the possible side effects of methimazole including the chance of rash, the small chance of liver irritation/juandice and the <=1 in 300-400 chance of sudden onset agranulocytosis.  We discussed importance of going to ED promptly (and stopping methimazole) if shewere to develop significant fever with severe sore throat of other evidence of acute infection.      Medications : Methimazole 5 mg daily  Atenolol 25 mg daily   Labs the week of May 11th at [facility]    I discussed the assessment and treatment plan with the patient. The patient was provided an opportunity to ask questions and all were answered. The patient agreed with the plan and demonstrated an understanding of the instructions.   The patient was advised to call back or seek an in-person evaluation if the symptoms worsen or if the condition fails to improve as anticipated.    2. Left Thyroid nodule:   - This was on an ultrasound in 2016 - No local neck symptoms - No follow up was recommended at  The time but this could be the reason for  her hyperthyroidism - Will  consider thyroid uptake and scan  If TRAb is negative.    F/u in 2 months    Signed electronically by: Mack Guise, MD  [facility]  [facility] [address] [place], [place] [address] Phone: [phone] FAX: [phone]   CC: System, Pcp Not In No address on file Phone: None Fax: None   Return to Endocrinology clinic as below: Future Appointments  Date Time Provider [place]  01/24/2019  2:00 PM Jaimya Feliciano, Melanie Crazier, MD [facility] None

## 2019-02-01 ENCOUNTER — Telehealth: Payer: Self-pay | Admitting: Internal Medicine

## 2019-02-01 NOTE — Telephone Encounter (Signed)
Patient's daughter called to advise that the Methimazole is causing diarrhea and that she was asked to call in if that happened.  Would like to know how to proceed.  Call back number is [phone] for [name]

## 2019-02-01 NOTE — Telephone Encounter (Signed)
Daughter stated that she understood instructions and that she would try to contact PCP to have labs ordered

## 2019-02-01 NOTE — Telephone Encounter (Signed)
Please advise 

## 2019-02-06 ENCOUNTER — Other Ambulatory Visit: Payer: Self-pay | Admitting: Internal Medicine

## 2019-02-06 ENCOUNTER — Other Ambulatory Visit: Payer: Self-pay

## 2019-02-06 ENCOUNTER — Telehealth: Payer: Self-pay | Admitting: Internal Medicine

## 2019-02-06 ENCOUNTER — Other Ambulatory Visit: Payer: Medicare Other

## 2019-02-06 DIAGNOSIS — E0590 Thyrotoxicosis, unspecified without thyrotoxic crisis or storm: Secondary | ICD-10-CM

## 2019-02-06 NOTE — Addendum Note (Signed)
Addended by: Kaylyn Lim I on: 02/06/2019 03:02 PM   Modules accepted: Orders

## 2019-02-06 NOTE — Telephone Encounter (Signed)
Changed.

## 2019-02-06 NOTE — Telephone Encounter (Signed)
2nd attempt for lab conversion. Lab Order needs to be changed to [employer] [facility]  Can you assist Janett Billow at [place]?

## 2019-02-06 NOTE — Progress Notes (Signed)
na

## 2019-02-06 NOTE — Telephone Encounter (Signed)
Valerie [name] at [facility] called re: Lab Order needs to be changed to [employer]

## 2019-02-07 LAB — TSH: TSH: 0.345 u[IU]/mL — ABNORMAL LOW (ref 0.450–4.500)

## 2019-02-07 LAB — T4, FREE: Free T4: 1.44 ng/dL (ref 0.82–1.77)

## 2019-02-07 LAB — THYROTROPIN RECEPTOR AUTOABS: Thyrotropin Receptor Ab: 3.12 IU/L — ABNORMAL HIGH (ref 0.00–1.75)

## 2019-02-17 ENCOUNTER — Inpatient Hospital Stay (HOSPITAL_COMMUNITY)
Admission: EM | Admit: 2019-02-17 | Discharge: 2019-02-19 | DRG: 293 | Disposition: A | Discharge status: Home / Self Care | Payer: Medicare Other | Attending: Internal Medicine | Admitting: Internal Medicine

## 2019-02-17 ENCOUNTER — Other Ambulatory Visit: Payer: Self-pay

## 2019-02-17 ENCOUNTER — Encounter (HOSPITAL_COMMUNITY): Payer: Self-pay | Admitting: Emergency Medicine

## 2019-02-17 ENCOUNTER — Emergency Department (HOSPITAL_COMMUNITY): Payer: Medicare Other

## 2019-02-17 DIAGNOSIS — I5033 Acute on chronic diastolic (congestive) heart failure: Secondary | ICD-10-CM

## 2019-02-17 DIAGNOSIS — Z20828 Contact with and (suspected) exposure to other viral communicable diseases: Secondary | ICD-10-CM | POA: Diagnosis present

## 2019-02-17 DIAGNOSIS — Z7982 Long term (current) use of aspirin: Secondary | ICD-10-CM

## 2019-02-17 DIAGNOSIS — Z8051 Family history of malignant neoplasm of kidney: Secondary | ICD-10-CM

## 2019-02-17 DIAGNOSIS — F419 Anxiety disorder, unspecified: Secondary | ICD-10-CM | POA: Diagnosis present

## 2019-02-17 DIAGNOSIS — E876 Hypokalemia: Secondary | ICD-10-CM | POA: Diagnosis present

## 2019-02-17 DIAGNOSIS — Z8052 Family history of malignant neoplasm of bladder: Secondary | ICD-10-CM | POA: Diagnosis not present

## 2019-02-17 DIAGNOSIS — K219 Gastro-esophageal reflux disease without esophagitis: Secondary | ICD-10-CM | POA: Diagnosis present

## 2019-02-17 DIAGNOSIS — I509 Heart failure, unspecified: Secondary | ICD-10-CM | POA: Diagnosis not present

## 2019-02-17 DIAGNOSIS — Z800 Family history of malignant neoplasm of digestive organs: Secondary | ICD-10-CM | POA: Diagnosis not present

## 2019-02-17 DIAGNOSIS — E0590 Thyrotoxicosis, unspecified without thyrotoxic crisis or storm: Secondary | ICD-10-CM | POA: Diagnosis present

## 2019-02-17 DIAGNOSIS — I110 Hypertensive heart disease with heart failure: Secondary | ICD-10-CM | POA: Diagnosis present

## 2019-02-17 DIAGNOSIS — R0602 Shortness of breath: Secondary | ICD-10-CM | POA: Diagnosis present

## 2019-02-17 DIAGNOSIS — I252 Old myocardial infarction: Secondary | ICD-10-CM

## 2019-02-17 DIAGNOSIS — Z66 Do not resuscitate: Secondary | ICD-10-CM | POA: Diagnosis not present

## 2019-02-17 DIAGNOSIS — Z888 Allergy status to other drugs, medicaments and biological substances status: Secondary | ICD-10-CM

## 2019-02-17 DIAGNOSIS — E785 Hyperlipidemia, unspecified: Secondary | ICD-10-CM | POA: Diagnosis present

## 2019-02-17 DIAGNOSIS — Z79899 Other long term (current) drug therapy: Secondary | ICD-10-CM | POA: Diagnosis not present

## 2019-02-17 DIAGNOSIS — I4891 Unspecified atrial fibrillation: Secondary | ICD-10-CM

## 2019-02-17 DIAGNOSIS — Z882 Allergy status to sulfonamides status: Secondary | ICD-10-CM | POA: Diagnosis not present

## 2019-02-17 DIAGNOSIS — Z806 Family history of leukemia: Secondary | ICD-10-CM | POA: Diagnosis not present

## 2019-02-17 DIAGNOSIS — F329 Major depressive disorder, single episode, unspecified: Secondary | ICD-10-CM | POA: Diagnosis present

## 2019-02-17 DIAGNOSIS — I10 Essential (primary) hypertension: Secondary | ICD-10-CM | POA: Diagnosis not present

## 2019-02-17 DIAGNOSIS — K589 Irritable bowel syndrome without diarrhea: Secondary | ICD-10-CM | POA: Diagnosis present

## 2019-02-17 DIAGNOSIS — I2510 Atherosclerotic heart disease of native coronary artery without angina pectoris: Secondary | ICD-10-CM | POA: Diagnosis present

## 2019-02-17 DIAGNOSIS — I4819 Other persistent atrial fibrillation: Secondary | ICD-10-CM | POA: Diagnosis not present

## 2019-02-17 DIAGNOSIS — Z823 Family history of stroke: Secondary | ICD-10-CM

## 2019-02-17 LAB — COMPREHENSIVE METABOLIC PANEL
ALT: 16 U/L (ref 0–44)
AST: 18 U/L (ref 15–41)
Albumin: 3.2 g/dL — ABNORMAL LOW (ref 3.5–5.0)
Alkaline Phosphatase: 83 U/L (ref 38–126)
Anion gap: 12 (ref 5–15)
BUN: 11 mg/dL (ref 8–23)
CO2: 21 mmol/L — ABNORMAL LOW (ref 22–32)
Calcium: 9.3 mg/dL (ref 8.9–10.3)
Chloride: 108 mmol/L (ref 98–111)
Creatinine, Ser: 0.81 mg/dL (ref 0.44–1.00)
GFR calc Af Amer: >60 mL/min (ref >60)
GFR calc non Af Amer: >60 mL/min (ref >60)
Glucose, Bld: 98 mg/dL (ref 70–99)
Potassium: 3.6 mmol/L (ref 3.5–5.1)
Sodium: 141 mmol/L (ref 135–145)
Total Bilirubin: 0.6 mg/dL (ref 0.3–1.2)
Total Protein: 5.9 g/dL — ABNORMAL LOW (ref 6.5–8.1)

## 2019-02-17 LAB — CBC WITH DIFFERENTIAL/PLATELET
Abs Immature Granulocytes: 0.02 10*3/uL (ref 0.00–0.07)
Basophils Absolute: 0 10*3/uL (ref 0.0–0.1)
Basophils Relative: 0 %
Eosinophils Absolute: 0.1 10*3/uL (ref 0.0–0.5)
Eosinophils Relative: 1 %
HCT: 35.6 % — ABNORMAL LOW (ref 36.0–46.0)
Hemoglobin: 11.7 g/dL — ABNORMAL LOW (ref 12.0–15.0)
Immature Granulocytes: 0 %
Lymphocytes Relative: 21 %
Lymphs Abs: 1.7 10*3/uL (ref 0.7–4.0)
MCH: 31.2 pg (ref 26.0–34.0)
MCHC: 32.9 g/dL (ref 30.0–36.0)
MCV: 94.9 fL (ref 80.0–100.0)
Monocytes Absolute: 0.6 10*3/uL (ref 0.1–1.0)
Monocytes Relative: 8 %
Neutro Abs: 5.6 10*3/uL (ref 1.7–7.7)
Neutrophils Relative %: 70 %
Platelets: 350 10*3/uL (ref 150–400)
RBC: 3.75 MIL/uL — ABNORMAL LOW (ref 3.87–5.11)
RDW: 13.6 % (ref 11.5–15.5)
WBC: 8.1 10*3/uL (ref 4.0–10.5)
nRBC: 0 % (ref 0.0–0.2)

## 2019-02-17 LAB — BRAIN NATRIURETIC PEPTIDE: B Natriuretic Peptide: 914.9 pg/mL — ABNORMAL HIGH (ref 0.0–100.0)

## 2019-02-17 LAB — TROPONIN I
Troponin I: <0.03 ng/mL (ref <0.03)
Troponin I: <0.03 ng/mL (ref <0.03)

## 2019-02-17 LAB — SARS CORONAVIRUS 2 BY RT PCR (HOSPITAL ORDER, PERFORMED IN ~~LOC~~ HOSPITAL LAB): SARS Coronavirus 2: NEGATIVE

## 2019-02-17 LAB — TSH: TSH: 0.537 u[IU]/mL (ref 0.350–4.500)

## 2019-02-17 MED ORDER — FLUTICASONE PROPIONATE 50 MCG/ACT NA SUSP: 2.0000 | NASAL | Status: DC | PRN

## 2019-02-17 MED ORDER — SODIUM CHLORIDE 0.9 % IV SOLN: 250.0000 mL | INTRAVENOUS | Status: DC | PRN

## 2019-02-17 MED ORDER — FUROSEMIDE 10 MG/ML IJ SOLN
20.0000 mg | Freq: Once | INTRAMUSCULAR | Status: AC
  Administered 2019-02-17: 17:00:00 20 mg via INTRAVENOUS
  Filled 2019-02-17: qty 2

## 2019-02-17 MED ORDER — METOPROLOL TARTRATE 5 MG/5ML IV SOLN
2.5000 mg | INTRAVENOUS | Status: DC | PRN
  Administered 2019-02-17 – 2019-02-19 (×2): 2.5 mg via INTRAVENOUS
  Filled 2019-02-17 (×2): qty 5

## 2019-02-17 MED ORDER — SODIUM CHLORIDE 0.9% FLUSH: 3.0000 mL | INTRAVENOUS | Status: DC | PRN

## 2019-02-17 MED ORDER — NITROGLYCERIN 0.4 MG SL SUBL: 0.4000 mg | SUBLINGUAL_TABLET | SUBLINGUAL | Status: DC | PRN

## 2019-02-17 MED ORDER — ENOXAPARIN SODIUM 40 MG/0.4ML ~~LOC~~ SOLN
40.0000 mg | SUBCUTANEOUS | Status: DC
  Administered 2019-02-17: 40 mg via SUBCUTANEOUS
  Filled 2019-02-17: qty 0.4

## 2019-02-17 MED ORDER — METHIMAZOLE 5 MG PO TABS
5.0000 mg | ORAL_TABLET | Freq: Every day | ORAL | Status: DC
  Administered 2019-02-17 – 2019-02-19 (×3): 5 mg via ORAL
  Filled 2019-02-17 (×3): qty 1

## 2019-02-17 MED ORDER — ACETAMINOPHEN 325 MG PO TABS: 650.0000 mg | ORAL_TABLET | Freq: Four times a day (QID) | ORAL | Status: DC | PRN

## 2019-02-17 MED ORDER — ASPIRIN EC 81 MG PO TBEC
81.0000 mg | DELAYED_RELEASE_TABLET | Freq: Every day | ORAL | Status: DC
  Administered 2019-02-17 – 2019-02-18 (×2): 81 mg via ORAL
  Filled 2019-02-17 (×2): qty 1

## 2019-02-17 MED ORDER — SODIUM CHLORIDE 0.9% FLUSH
3.0000 mL | Freq: Two times a day (BID) | INTRAVENOUS | Status: DC
  Administered 2019-02-17 – 2019-02-19 (×4): 3 mL via INTRAVENOUS

## 2019-02-17 MED ORDER — CARVEDILOL 3.125 MG PO TABS
3.1250 mg | ORAL_TABLET | Freq: Two times a day (BID) | ORAL | Status: DC
  Administered 2019-02-18 – 2019-02-19 (×3): 3.125 mg via ORAL
  Filled 2019-02-17 (×4): qty 1

## 2019-02-17 MED ORDER — VITAMIN D (ERGOCALCIFEROL) 1.25 MG (50000 UNIT) PO CAPS: 50000.0000 [IU] | ORAL_CAPSULE | ORAL | Status: DC

## 2019-02-17 MED ORDER — ATENOLOL 25 MG PO TABS: 12.5000 mg | ORAL_TABLET | Freq: Every day | ORAL | Status: DC

## 2019-02-17 MED ORDER — FUROSEMIDE 10 MG/ML IJ SOLN
20.0000 mg | Freq: Two times a day (BID) | INTRAMUSCULAR | Status: DC
  Administered 2019-02-18: 20 mg via INTRAVENOUS
  Filled 2019-02-17 (×2): qty 2

## 2019-02-17 MED ORDER — PRAVASTATIN SODIUM 10 MG PO TABS
10.0000 mg | ORAL_TABLET | Freq: Every day | ORAL | Status: DC
  Administered 2019-02-17 – 2019-02-19 (×3): 10 mg via ORAL
  Filled 2019-02-17 (×3): qty 1

## 2019-02-17 NOTE — ED Provider Notes (Addendum)
[place] EMERGENCY DEPARTMENT Provider Note   CSN: [id number] Arrival date & time: 02/17/19  1433    History   Chief Complaint Chief Complaint  Patient presents with  . Shortness of Breath    HPI Valerie [name] is a 85 y.o. female.     HPI   85 yo F with PMHx below here with SOB. Pt reports that for the past 1 week she has had cough, SOB, and general fatigue. Pt reports his sx started 1 week ago gradually. She began to notice feeling markedly SOB, even at rest, with difficulty speaking full sentences. She went to UC and was diagnosed with sinusitis, has since been taking OTC Mucinex DM. This has mildly improved her SOB, sinus congestion but her SOB persists. She's had poor appetite and fatigue w/ this as well. No known fever or chills. No leg swelling, no known h/o CHF. No h/o DVTs. No chest pain. No h/o CAD. She called Cardiology today and was told to come here for eval. No specific alleviating factors. She notes her BP has been elevated to 140-150s which is high for her since taking Mucinex.  Past Medical History:  Diagnosis Date  . Anxiety   . Arthritis   . Broken ribs   . Carotid artery disease ([place])   . Cataract   . Collar bone fracture    Right  . Depression   . Diverticulosis of colon with hemorrhage   . Duodenitis   . Esophageal reflux   . Esophageal stricture   . Essential hypertension   . Glaucoma   . Hiatal hernia   . History of kidney stones     x1   . Hyperlipidemia   . IBS (irritable bowel syndrome)   . Palpitations   . UTI (urinary tract infection) March 2015    Patient Active Problem List   Diagnosis Date Noted  . Congestive heart failure (CHF) ([place]) 02/17/2019  . Left thyroid nodule 01/24/2019  . Hyperthyroidism 01/14/2019  . Near syncope 01/13/2019  . Essential hypertension 01/13/2019  . History of bradycardia 01/13/2019  . Bradycardia 04/06/2018  . PVC (premature ventricular contraction) 11/26/2016  . Hyperlipidemia  11/26/2016  . Dislocated IOL (intraocular lens), posterior, right 10/20/2016  . Posterior dislocation of iol (intraocular lens), right eye 10/20/2016  . Hypertensive urgency 06/30/2016  . History of palpitations 06/30/2016  . Blurry vision, bilateral   . Glaucoma 05/25/2016  . Thyroid nodule 05/20/2015  . Carotid stenosis, non-symptomatic 09/14/2012  . Internal and external hemorrhoids without complication 84/16/6063  . Vitamin B12 deficiency 09/15/2011  . Benign hypertensive heart disease without heart failure 05/22/2011  . Pure hypercholesterolemia 05/22/2011  . Stricture esophagus 05/18/2011  . Stricture of duodenum 05/18/2011  . Iron deficiency anemia 05/15/2011  . DIVERTICULOSIS-COLON 02/03/2008    Past Surgical History:  Procedure Laterality Date  . CHOLECYSTECTOMY    . COLONOSCOPY    . DILATION AND CURETTAGE OF UTERUS    . EYE SURGERY Bilateral    total of 5  . LASER PHOTO ABLATION Right 10/20/2016   Procedure: LASER PHOTO ABLATION;  Surgeon: Hayden Pedro, MD;  Location: [place];  Service: Ophthalmology;  Laterality: Right;  Headscope laser  . LEFT ROTATOR CUFF REPAIR X2 Left   . PARS PLANA VITRECTOMY  10/20/2016   WITH 25G REMOVAL/SUTURE SECONDARY INTRAOCULAR LENS, GAS FLUID EXCHANGE   . PARS PLANA VITRECTOMY Right 10/20/2016   Procedure: PARS PLANA VITRECTOMY WITH 25G REMOVAL/SUTURE SECONDARY INTRAOCULAR LENS, GAS FLUID EXCHANGE;  Surgeon: Hayden Pedro, MD;  Location: [place];  Service: Ophthalmology;  Laterality: Right;  . RIGHT ROTATOR CUFF REPAIR X1 Right   . UPPER GASTROINTESTINAL ENDOSCOPY     with dilation     OB History   No obstetric history on file.      Home Medications    Prior to Admission medications   Medication Sig Start Date End Date Taking? Authorizing Provider  acetaminophen (TYLENOL) 325 MG tablet Take 650 mg by mouth every 6 (six) hours as needed for headache (pain).   Yes [provider]  aspirin EC 81 MG tablet Take 81 mg by  mouth daily.   Yes [provider]  atenolol (TENORMIN) 25 MG tablet Take 0.5 tablets (12.5 mg total) by mouth daily. 01/23/19  Yes Nahser, Wonda Cheng, MD  fluticasone [facility]) 50 MCG/ACT nasal spray Place 2 sprays into both nostrils as needed for allergies or rhinitis.   Yes [provider]  methimazole (TAPAZOLE) 5 MG tablet Take 1 tablet (5 mg total) by mouth daily. 01/24/19  Yes Shamleffer, Melanie Crazier, MD  nitroGLYCERIN (NITROSTAT) 0.4 MG SL tablet Place 1 tablet (0.4 mg total) under the tongue every 5 (five) minutes as needed for chest pain. 08/05/18  Yes Martin, Mary-Margaret, FNP  pravastatin (PRAVACHOL) 10 MG tablet TAKE 1 TABLET BY MOUTH ONCE DAILY Patient taking differently: Take 10 mg by mouth daily.  04/18/18  Yes Nahser, Wonda Cheng, MD  Vitamin D, Ergocalciferol, (DRISDOL) 50000 units CAPS capsule Take 1 capsule (50,000 Units total) by mouth every 7 (seven) days. Patient taking differently: Take 50,000 Units by mouth every Monday.  06/13/18  Yes Hawks, Theador Hawthorne, FNP    Family History Family History  Problem Relation Age of Onset  . Leukemia Mother   . Colon cancer Father 24  . Kidney cancer Brother   . Bladder Cancer Brother   . Stroke Sister   . Esophageal cancer Neg Hx   . Stomach cancer Neg Hx     Social History Social History   Tobacco Use  . Smoking status: Never Smoker  . Smokeless tobacco: Never Used  Substance Use Topics  . Alcohol use: No  . Drug use: No     Allergies   Aricept [donepezil hcl]; Lisinopril; Sulfa antibiotics; and Sulfasalazine   Review of Systems Review of Systems  Constitutional: Positive for fatigue. Negative for chills and fever.  HENT: Negative for congestion and rhinorrhea.   Eyes: Negative for visual disturbance.  Respiratory: Positive for chest tightness and shortness of breath. Negative for cough and wheezing.   Cardiovascular: Negative for chest pain and leg swelling.  Gastrointestinal: Negative for abdominal  pain, diarrhea, nausea and vomiting.  Genitourinary: Negative for dysuria and flank pain.  Musculoskeletal: Negative for neck pain and neck stiffness.  Skin: Negative for rash and wound.  Allergic/Immunologic: Negative for immunocompromised state.  Neurological: Positive for weakness. Negative for syncope and headaches.  All other systems reviewed and are negative.    Physical Exam Updated Vital Signs BP (!) 156/95   Pulse 80   Temp 98.3 F (36.8 C) (Oral)   Resp (!) 22   Ht 5\' 4"  (1.626 m)   Wt 49 kg   SpO2 98%   BMI 18.54 kg/m   Physical Exam Vitals signs and nursing note reviewed.  Constitutional:      General: She is not in acute distress.    Appearance: She is well-developed.  HENT:     Head: Normocephalic and atraumatic.  Eyes:     Conjunctiva/sclera: Conjunctivae normal.  Neck:     Musculoskeletal: Neck supple.  Cardiovascular:     Rate and Rhythm: Normal rate and regular rhythm.     Heart sounds: Normal heart sounds. No murmur. No friction rub.  Pulmonary:     Effort: Pulmonary effort is normal. No respiratory distress.     Breath sounds: Examination of the right-lower field reveals rales. Examination of the left-lower field reveals rales. Rales present. No wheezing.  Abdominal:     General: There is no distension.     Palpations: Abdomen is soft.     Tenderness: There is no abdominal tenderness.  Skin:    General: Skin is warm.     Capillary Refill: Capillary refill takes less than 2 seconds.  Neurological:     Mental Status: She is alert and oriented to person, place, and time.     Motor: No abnormal muscle tone.      ED Treatments / Results  Labs (all labs ordered are listed, but only abnormal results are displayed) Labs Reviewed  CBC WITH DIFFERENTIAL/PLATELET - Abnormal; Notable for the following components:      Result Value   RBC 3.75 (*)    Hemoglobin 11.7 (*)    HCT 35.6 (*)    All other components within normal limits  COMPREHENSIVE  METABOLIC PANEL - Abnormal; Notable for the following components:   CO2 21 (*)    Total Protein 5.9 (*)    Albumin 3.2 (*)    All other components within normal limits  BRAIN NATRIURETIC PEPTIDE - Abnormal; Notable for the following components:   B Natriuretic Peptide 914.9 (*)    All other components within normal limits  SARS CORONAVIRUS 2 (HOSPITAL ORDER, [place] [facility])  TROPONIN I    EKG EKG Interpretation  Date/Time:  Friday Feb 17 2019 17:03:49 EDT Ventricular Rate:  93 PR Interval:    QRS Duration: 80 QT Interval:  423 QTC Calculation: 527 R Axis:   65 Text Interpretation:  Atrial fibrillation Anterior infarct, age indeterminate Prolonged QT interval Since last EKG, AFib is new Confirmed by Duffy Bruce [phone]) on 02/17/2019 5:09:34 PM   Radiology Dg Chest 2 View  Result Date: 02/17/2019 CLINICAL DATA:  85 year old female with shortness of breath for 1 week EXAM: CHEST - 2 VIEW COMPARISON:  01/13/2019, 11/27/2018 FINDINGS: Cardiomediastinal silhouette unchanged. Interval development of bibasilar opacities with blunting of the costophrenic angles and meniscus on the lateral view. Interlobular septal thickening. No pneumothorax. Coarsened interstitial markings persist. Osteopenia with degenerative changes of the spine. No displaced fracture. Surgical changes of bilateral shoulder arthroplasty IMPRESSION: Congestive heart failure with small bilateral pleural effusions. Electronically Signed   By: Corrie Mckusick D.O.   On: 02/17/2019 16:24    Procedures Procedures (including critical care time)  Medications Ordered in ED Medications  furosemide (LASIX) injection 20 mg (20 mg Intravenous Given 02/17/19 1711)     CHA2Ds2-VASc Score for Atrial Fibrillation    Patient Score  Age <65 = 0 65-74 = 1 > 75 = 2 2  Sex Female = 0 Female = 1 1  CHF History No = 0  Yes = 1 1  HTN History No = 0  Yes = 1 1  Stroke/TIA/TE History No = 0  Yes = 1 0   Vascular Disease History No = 0  Yes = 1 0  Diabetes History No = 0  Yes = 1 1  Total:  6   9.7 % stroke rate/year from a score of 6   Initial Impression / Assessment and Plan / ED Course  I have reviewed the triage vital signs and the nursing notes.  Pertinent labs & imaging results that were available during my care of the patient were reviewed by me and considered in my medical decision making (see chart for details).  Clinical Course as of Feb 16 1933  Fri Feb 17, 2019  1512 85 yo F here w/ SOB. DDx includes bronchitis, atypical PNA, COVID-19 though no fevers, CHF, less likely angina. Labs, CXR pending. No CP currently.   [CI]  5320 Labs, imaging concerning for acute CHF.  BNP markedly elevated at 914.  Chest x-ray consistent with cardiomegaly and pulmonary edema.  She remains intermittently tachypneic. Given age, CHF, HTN, tachypnea, borderline hypoxia intermittently, will admit.   [CI]    Clinical Course User Index [CI] Duffy Bruce, MD      Medicine to admit. Will make determination re: anticoag.  Final Clinical Impressions(s) / ED Diagnoses   Final diagnoses:  Acute on chronic diastolic congestive heart failure [facility])  New onset atrial fibrillation [facility])    ED Discharge Orders    None       Duffy Bruce, MD 02/17/19 Barnett Abu    Duffy Bruce, MD 02/17/19 Joen Laura

## 2019-02-17 NOTE — ED Triage Notes (Signed)
Pt arrives via EMS from home with reports of SOB x1 week. Reports she has seen her PCP and given mucinex. Pt O2 sats 97% on RA. EMS put pt on 2L for comfort.

## 2019-02-17 NOTE — ED Notes (Signed)
Pt up with assistance to bedside commode. Pt resting in bed at this time.

## 2019-02-17 NOTE — ED Triage Notes (Signed)
Pt came in with c/o shortness of breath for over a week.  Pt in no distress at this time

## 2019-02-17 NOTE — H&P (Addendum)
[facility]                                                                                    Patient Demographics  Valerie [name], is a 85 y.o. female  CSN: [id number]  MRN: [id number]  DOB - 18-Mar-1928  Admit Date - 02/17/2019  Outpatient Primary MD for the patient is Nahser, Wonda Cheng, MD   With History of -  Past Medical History:  Diagnosis Date  . Anxiety   . Arthritis   . Broken ribs   . Carotid artery disease ([place])   . Cataract   . Collar bone fracture    Right  . Depression   . Diverticulosis of colon with hemorrhage   . Duodenitis   . Esophageal reflux   . Esophageal stricture   . Essential hypertension   . Glaucoma   . Hiatal hernia   . History of kidney stones     x1   . Hyperlipidemia   . IBS (irritable bowel syndrome)   . Palpitations   . UTI (urinary tract infection) March 2015      Past Surgical History:  Procedure Laterality Date  . CHOLECYSTECTOMY    . COLONOSCOPY    . DILATION AND CURETTAGE OF UTERUS    . EYE SURGERY Bilateral    total of 5  . LASER PHOTO ABLATION Right 10/20/2016   Procedure: LASER PHOTO ABLATION;  Surgeon: Hayden Pedro, MD;  Location: [facility];  Service: Ophthalmology;  Laterality: Right;  Headscope laser  . LEFT ROTATOR CUFF REPAIR X2 Left   . PARS PLANA VITRECTOMY  10/20/2016   WITH 25G REMOVAL/SUTURE SECONDARY INTRAOCULAR LENS, GAS FLUID EXCHANGE   . PARS PLANA VITRECTOMY Right 10/20/2016   Procedure: PARS PLANA VITRECTOMY WITH 25G REMOVAL/SUTURE SECONDARY INTRAOCULAR LENS, GAS FLUID EXCHANGE;  Surgeon: Hayden Pedro, MD;  Location: [facility] [facility];  Service: Ophthalmology;  Laterality: Right;  . RIGHT ROTATOR CUFF REPAIR X1 Right   . UPPER GASTROINTESTINAL ENDOSCOPY     with dilation    in for   Chief Complaint  Patient presents with  . Shortness of Breath     HPI  Valerie [name]  is a 85 y.o. female, with past medical history significant for diastolic congestive heart failure, chronic, history of hypertension,  hyperlipidemia, bradycardia and hyperthyroidism on treatment presenting today with 10 days history of increasing shortness of breath that got worse today.  No chest pains, fever, chills, nausea or vomiting.  Patient denies any diarrhea. In the emergency room the patient was noted to be in A. fib and was in congestive heart failure.  Patient is not on anticoagulation and her last echo was done on 12/7423 showing diastolic congestive heart failure with normal ejection fraction.  Patient was started on Lasix IV COVID-19 test negative    Review of Systems    In addition to the HPI above,  No Fever-chills, No Headache, No changes with Vision or hearing, No problems swallowing food or Liquids, No Chest pain,  No Abdominal pain, No Nausea or Vommitting, Bowel movements are regular, No Blood in stool or Urine, No dysuria, No new skin rashes or bruises, No  new joints pains-aches,  No new weakness, tingling, numbness in any extremity, No recent weight gain or loss, No polyuria, polydypsia or polyphagia, No significant Mental Stressors.  A full 10 point Review of Systems was done, except as stated above, all other Review of Systems were negative.   Social History Social History   Tobacco Use  . Smoking status: Never Smoker  . Smokeless tobacco: Never Used  Substance Use Topics  . Alcohol use: No     Family History Family History  Problem Relation Age of Onset  . Leukemia Mother   . Colon cancer Father 33  . Kidney cancer Brother   . Bladder Cancer Brother   . Stroke Sister   . Esophageal cancer Neg Hx   . Stomach cancer Neg Hx      Prior to Admission medications   Medication Sig Start Date End Date Taking? Authorizing Provider  acetaminophen (TYLENOL) 325 MG tablet Take 650 mg by mouth every 6 (six) hours as needed for headache (pain).   Yes [provider]  aspirin EC 81 MG tablet Take 81 mg by mouth daily.   Yes [provider]  atenolol (TENORMIN) 25 MG  tablet Take 0.5 tablets (12.5 mg total) by mouth daily. 01/23/19  Yes Nahser, Wonda Cheng, MD  fluticasone [facility]) 50 MCG/ACT nasal spray Place 2 sprays into both nostrils as needed for allergies or rhinitis.   Yes [provider]  methimazole (TAPAZOLE) 5 MG tablet Take 1 tablet (5 mg total) by mouth daily. 01/24/19  Yes Shamleffer, Melanie Crazier, MD  nitroGLYCERIN (NITROSTAT) 0.4 MG SL tablet Place 1 tablet (0.4 mg total) under the tongue every 5 (five) minutes as needed for chest pain. 08/05/18  Yes Martin, Mary-Margaret, FNP  pravastatin (PRAVACHOL) 10 MG tablet TAKE 1 TABLET BY MOUTH ONCE DAILY Patient taking differently: Take 10 mg by mouth daily.  04/18/18  Yes Nahser, Wonda Cheng, MD  Vitamin D, Ergocalciferol, (DRISDOL) 50000 units CAPS capsule Take 1 capsule (50,000 Units total) by mouth every 7 (seven) days. Patient taking differently: Take 50,000 Units by mouth every Monday.  06/13/18  Yes Sharion Balloon, FNP    Allergies  Allergen Reactions  . Aricept [Donepezil Hcl] Other (See Comments)    Nightmares, near syncope, weak, decreased appetite.  . Lisinopril Other (See Comments)    Hair loss  . Sulfa Antibiotics Nausea Only  . Sulfasalazine Nausea Only    Physical Exam  Vitals  Blood pressure (!) 156/95, pulse 80, temperature 98.3 F (36.8 C), temperature source Oral, resp. rate (!) 22, height 5\' 4"  (1.626 m), weight 49 kg, SpO2 98 %.   1. General elderly, well-developed, well-nourished, looks chronically ill  2. Normal affect and insight, Not Suicidal or Homicidal, Awake Alert, Oriented X 3.  3. No F.N deficits, grossly, patient moving all extremities.  4. Ears and Eyes appear Normal, Conjunctivae clear, PERRLA. Moist Oral Mucosa.  5. Supple Neck, mild neck vein distention on 30 degrees.  6. Symmetrical Chest wall movement, decreased breath sounds at the bases.  7.  Irregularly irregular, no Gallops, Rubs or Murmurs, No Parasternal Heave.  8. Positive Bowel  Sounds, Abdomen Soft, Non tender, .  9.  No Cyanosis, Normal Skin Turgor, No Skin Rash or Bruise.  10. Good muscle tone,  joints appear normal , no effusions, Normal ROM.    Data Review  CBC Recent Labs  Lab 02/17/19 1542  WBC 8.1  HGB 11.7*  HCT 35.6*  PLT 350  MCV  94.9  MCH 31.2  MCHC 32.9  RDW 13.6  LYMPHSABS 1.7  MONOABS 0.6  EOSABS 0.1  BASOSABS 0.0   ------------------------------------------------------------------------------------------------------------------  Chemistries  Recent Labs  Lab 02/17/19 1542  NA 141  K 3.6  CL 108  CO2 21*  GLUCOSE 98  BUN 11  CREATININE 0.81  CALCIUM 9.3  AST 18  ALT 16  ALKPHOS 83  BILITOT 0.6   ------------------------------------------------------------------------------------------------------------------ estimated creatinine clearance is 36.4 mL/min (by C-G formula based on SCr of 0.81 mg/dL). ------------------------------------------------------------------------------------------------------------------ No results for input(s): TSH, T4TOTAL, T3FREE, THYROIDAB in the last 72 hours.  Invalid input(s): FREET3   Coagulation profile No results for input(s): INR, PROTIME in the last 168 hours. ------------------------------------------------------------------------------------------------------------------- No results for input(s): DDIMER in the last 72 hours. -------------------------------------------------------------------------------------------------------------------  Cardiac Enzymes Recent Labs  Lab 02/17/19 1542  TROPONINI <0.03   ------------------------------------------------------------------------------------------------------------------ Invalid input(s): POCBNP   ---------------------------------------------------------------------------------------------------------------  Urinalysis    Component Value Date/Time   COLORURINE YELLOW 01/13/2019 2120   APPEARANCEUR CLEAR 01/13/2019 2120    APPEARANCEUR Clear 06/11/2018 1002   LABSPEC 1.014 01/13/2019 2120   PHURINE 6.0 01/13/2019 2120   GLUCOSEU NEGATIVE 01/13/2019 2120   HGBUR NEGATIVE 01/13/2019 2120   BILIRUBINUR NEGATIVE 01/13/2019 2120   BILIRUBINUR Negative 06/11/2018 1002   [place] U 01/13/2019 2120   PROTEINUR NEGATIVE 01/13/2019 2120   UROBILINOGEN negative 11/08/2013 0908   UROBILINOGEN 0.2 11/30/2008 1105   NITRITE NEGATIVE 01/13/2019 2120   LEUKOCYTESUR NEGATIVE 01/13/2019 2120    ----------------------------------------------------------------------------------------------------------------    Imaging results:   Dg Chest 2 View  Result Date: 02/17/2019 CLINICAL DATA:  85 year old female with shortness of breath for 1 week EXAM: CHEST - 2 VIEW COMPARISON:  01/13/2019, 11/27/2018 FINDINGS: Cardiomediastinal silhouette unchanged. Interval development of bibasilar opacities with blunting of the costophrenic angles and meniscus on the lateral view. Interlobular septal thickening. No pneumothorax. Coarsened interstitial markings persist. Osteopenia with degenerative changes of the spine. No displaced fracture. Surgical changes of bilateral shoulder arthroplasty IMPRESSION: Congestive heart failure with small bilateral pleural effusions. Electronically Signed   By: Corrie Mckusick D.O.   On: 02/17/2019 16:24    My personal review of EKG: A. fib at 93 bpm with old anterior MI  Assessment & Plan  Congestive heart failure/chronic, diastolic, last echocardiogram on 12/2018 with normal ejection fraction Start Lasix 20 mg IV twice daily Fluid restriction Serial troponin/EKG in a.m.  New onset A. Fib Not on anticoagulation May need a cardiology consult in AM  Hyperthyroidism On Tapazole  Hyperlipidemia Continue with Pravachol  CAD with old anterior MI by echo Continue with ASA and beta-blocker    DVT Prophylaxis Lovenox  AM Labs Ordered, also please review Full Orders    Code Status  DNR  Disposition Plan: Home  Time spent in minutes : 45 minutes  Condition GUARDED   @SIGNATURE @

## 2019-02-17 NOTE — ED Notes (Signed)
ED TO INPATIENT HANDOFF REPORT  ED Nurse Name and Phone #:Lenae Wherley [phone]  S Name/Age/Gender [name] 85 y.o. female Room/Bed: 027C/027C  Code Status   Code Status: Prior  Home/SNF/Other Home Patient oriented to: self, place, time and situation Is this baseline? Yes   Triage Complete: Triage complete  Chief Complaint SOB  Triage Note Pt arrives via EMS from home with reports of SOB x1 week. Reports she has seen her PCP and given mucinex. Pt O2 sats 97% on RA. EMS put pt on 2L for comfort.  Pt came in with c/o shortness of breath for over a week.  Pt in no distress at this time   Allergies Allergies  Allergen Reactions  . Aricept [Donepezil Hcl] Other (See Comments)    Nightmares, near syncope, weak, decreased appetite.  . Lisinopril Other (See Comments)    Hair loss  . Sulfa Antibiotics Nausea Only  . Sulfasalazine Nausea Only    Level of Care/Admitting Diagnosis ED Disposition    ED Disposition Condition [facility] Area: [place] [[id number]]  Level of Care: Telemetry Cardiac [103]  Covid Evaluation: N/A  Diagnosis: Congestive heart failure (CHF) [facility]) [[id number]]  Admitting Physician: Merton Border Marshal.Browner  Attending Physician: Laren Everts, ALI Marshal.Browner  Estimated length of stay: past midnight tomorrow  Certification:: I certify this patient will need inpatient services for at least 2 midnights  PT Class (Do Not Modify): Inpatient [101]  PT Acc Code (Do Not Modify): Private [1]       B Medical/Surgery History Past Medical History:  Diagnosis Date  . Anxiety   . Arthritis   . Broken ribs   . Carotid artery disease ([place])   . Cataract   . Collar bone fracture    Right  . Depression   . Diverticulosis of colon with hemorrhage   . Duodenitis   . Esophageal reflux   . Esophageal stricture   . Essential hypertension   . Glaucoma   . Hiatal hernia   . History of kidney stones     x1   . Hyperlipidemia   . IBS (irritable  bowel syndrome)   . Palpitations   . UTI (urinary tract infection) March 2015   Past Surgical History:  Procedure Laterality Date  . CHOLECYSTECTOMY    . COLONOSCOPY    . DILATION AND CURETTAGE OF UTERUS    . EYE SURGERY Bilateral    total of 5  . LASER PHOTO ABLATION Right 10/20/2016   Procedure: LASER PHOTO ABLATION;  Surgeon: Hayden Pedro, MD;  Location: [place];  Service: Ophthalmology;  Laterality: Right;  Headscope laser  . LEFT ROTATOR CUFF REPAIR X2 Left   . PARS PLANA VITRECTOMY  10/20/2016   WITH 25G REMOVAL/SUTURE SECONDARY INTRAOCULAR LENS, GAS FLUID EXCHANGE   . PARS PLANA VITRECTOMY Right 10/20/2016   Procedure: PARS PLANA VITRECTOMY WITH 25G REMOVAL/SUTURE SECONDARY INTRAOCULAR LENS, GAS FLUID EXCHANGE;  Surgeon: Hayden Pedro, MD;  Location: [place];  Service: Ophthalmology;  Laterality: Right;  . RIGHT ROTATOR CUFF REPAIR X1 Right   . UPPER GASTROINTESTINAL ENDOSCOPY     with dilation     A IV Location/Drains/Wounds Patient Lines/Drains/Airways Status   Active Line/Drains/Airways    Name:   Placement date:   Placement time:   Site:   Days:   Peripheral IV 02/17/19 Right;Upper Forearm   02/17/19    1549    Forearm   less than 1   Incision (Closed) 10/20/16  Eye Right   10/20/16    1406     850          Intake/Output Last 24 hours No intake or output data in the 24 hours ending 02/17/19 1813  Labs/Imaging Results for orders placed or performed during the hospital encounter of 02/17/19 (from the past 48 hour(s))  CBC with Differential     Status: Abnormal   Collection Time: 02/17/19  3:42 PM  Result Value Ref Range   WBC 8.1 4.0 - 10.5 K/uL   RBC 3.75 (L) 3.87 - 5.11 MIL/uL   Hemoglobin 11.7 (L) 12.0 - 15.0 g/dL   HCT 35.6 (L) 36.0 - 46.0 %   MCV 94.9 80.0 - 100.0 fL   MCH 31.2 26.0 - 34.0 pg   MCHC 32.9 30.0 - 36.0 g/dL   RDW 13.6 11.5 - 15.5 %   Platelets 350 150 - 400 K/uL   nRBC 0.0 0.0 - 0.2 %   Neutrophils Relative % 70 %   Neutro Abs 5.6 1.7 -  7.7 K/uL   Lymphocytes Relative 21 %   Lymphs Abs 1.7 0.7 - 4.0 K/uL   Monocytes Relative 8 %   Monocytes Absolute 0.6 0.1 - 1.0 K/uL   Eosinophils Relative 1 %   Eosinophils Absolute 0.1 0.0 - 0.5 K/uL   Basophils Relative 0 %   Basophils Absolute 0.0 0.0 - 0.1 K/uL   Immature Granulocytes 0 %   Abs Immature Granulocytes 0.02 0.00 - 0.07 K/uL    Comment: Performed at [facility], [address], [place], [place] [address]  Comprehensive metabolic panel     Status: Abnormal   Collection Time: 02/17/19  3:42 PM  Result Value Ref Range   Sodium 141 135 - 145 mmol/L   Potassium 3.6 3.5 - 5.1 mmol/L   Chloride 108 98 - 111 mmol/L   CO2 21 (L) 22 - 32 mmol/L   Glucose, Bld 98 70 - 99 mg/dL   BUN 11 8 - 23 mg/dL   Creatinine, Ser 0.81 0.44 - 1.00 mg/dL   Calcium 9.3 8.9 - 10.3 mg/dL   Total Protein 5.9 (L) 6.5 - 8.1 g/dL   Albumin 3.2 (L) 3.5 - 5.0 g/dL   AST 18 15 - 41 U/L   ALT 16 0 - 44 U/L   Alkaline Phosphatase 83 38 - 126 U/L   Total Bilirubin 0.6 0.3 - 1.2 mg/dL   GFR calc non Af Amer >60 >60 mL/min   GFR calc Af Amer >60 >60 mL/min   Anion gap 12 5 - 15    Comment: Performed at [facility] [address], [place], [place] [address]  Troponin I - ONCE - STAT     Status: None   Collection Time: 02/17/19  3:42 PM  Result Value Ref Range   Troponin I <0.03 <0.03 ng/mL    Comment: Performed at [facility], [place] [address],  [place], [place] [address]  Brain natriuretic peptide     Status: Abnormal   Collection Time: 02/17/19  3:42 PM  Result Value Ref Range   B Natriuretic Peptide 914.9 (H) 0.0 - 100.0 pg/mL    Comment: Performed at [facility] [address] [place], [place] [address]  SARS Coronavirus 2 (CEPHEID- Performed in [facility] hospital lab), Hosp Order     Status: None   Collection Time: 02/17/19  3:42 PM  Result Value Ref Range   SARS Coronavirus 2 NEGATIVE  NEGATIVE    Comment: (NOTE) If result is NEGATIVE SARS-CoV-2 target  nucleic acids are NOT DETECTED. The SARS-CoV-2 RNA is generally detectable in upper and lower  respiratory specimens during the acute phase of infection. The lowest  concentration of SARS-CoV-2 viral copies this assay can detect is 250  copies / mL. A negative result does not preclude SARS-CoV-2 infection  and should not be used as the sole basis for treatment or other  patient management decisions.  A negative result may occur with  improper specimen collection / handling, submission of specimen other  than nasopharyngeal swab, presence of viral mutation(s) within the  areas targeted by this assay, and inadequate number of viral copies  (<250 copies / mL). A negative result must be combined with clinical  observations, patient history, and epidemiological information. If result is POSITIVE SARS-CoV-2 target nucleic acids are DETECTED. The SARS-CoV-2 RNA is generally detectable in upper and lower  respiratory specimens dur ing the acute phase of infection.  Positive  results are indicative of active infection with SARS-CoV-2.  Clinical  correlation with patient history and other diagnostic information is  necessary to determine patient infection status.  Positive results do  not rule out bacterial infection or co-infection with other viruses. If result is PRESUMPTIVE POSTIVE SARS-CoV-2 nucleic acids MAY BE PRESENT.   A presumptive positive result was obtained on the submitted specimen  and confirmed on repeat testing.  While 2019 novel coronavirus  (SARS-CoV-2) nucleic acids may be present in the submitted sample  additional confirmatory testing may be necessary for epidemiological  and / or clinical management purposes  to differentiate between  SARS-CoV-2 and other Sarbecovirus currently known to infect humans.  If clinically indicated additional testing with an alternate test  methodology [phone]) is advised. The SARS-CoV-2 RNA is generally  detectable in upper and lower  respiratory sp ecimens during the acute  phase of infection. The expected result is Negative. Fact Sheet for Patients:  StrictlyIdeas.no Fact Sheet for Healthcare Providers: BankingDealers.co.za This test is not yet approved or cleared by the [place] FDA and has been authorized for detection and/or diagnosis of SARS-CoV-2 by FDA under an Emergency Use Authorization (EUA).  This EUA will remain in effect (meaning this test can be used) for the duration of the COVID-19 declaration under Section 564(b)(1) of the Act, 21 U.S.C. section 360bbb-3(b)(1), unless the authorization is terminated or revoked sooner. Performed at [facility], [place] [address] [place], [place] [address]    Dg Chest 2 View  Result Date: 02/17/2019 CLINICAL DATA:  85 year old female with shortness of breath for 1 week EXAM: CHEST - 2 VIEW COMPARISON:  01/13/2019, 11/27/2018 FINDINGS: Cardiomediastinal silhouette unchanged. Interval development of bibasilar opacities with blunting of the costophrenic angles and meniscus on the lateral view. Interlobular septal thickening. No pneumothorax. Coarsened interstitial markings persist. Osteopenia with degenerative changes of the spine. No displaced fracture. Surgical changes of bilateral shoulder arthroplasty IMPRESSION: Congestive heart failure with small bilateral pleural effusions. Electronically Signed   By: Corrie Mckusick D.O.   On: 02/17/2019 16:24    Pending Labs [employer] (From admission, onward)    Start     Ordered   Signed and Held  TSH  Once,   R     Signed and Held   Signed and [employer] metabolic panel  Tomorrow morning,   R     Signed and Held   Signed and Held  Troponin I - Now Then [employer]  Now then every 6 hours,   R     Signed and Held          Vitals/Pain Today's Vitals   02/17/19 1619 02/17/19 1630 02/17/19 1730 02/17/19 1745  BP:  (!) 174/98  (!) 181/111  Pulse:  89 77 100  Resp:   (!) 23 (!) 22 20  Temp:      TempSrc:      SpO2:  96% 96% 96%  Weight:      Height:      PainSc: 0-No pain       Isolation Precautions No active isolations  Medications Medications  furosemide (LASIX) injection 20 mg (20 mg Intravenous Given 02/17/19 1711)    Mobility walks with device Moderate fall risk   Focused Assessments Pulmonary Assessment Handoff:  Lung sounds:   O2 Device: Room Air        R Recommendations: See Admitting Provider Note  Report given to:   Additional Notes: 20g IV Right forearm.  Purewick in place.  MD aware of BP

## 2019-02-17 NOTE — ED Notes (Signed)
Hijazi, MD notified of pt's blood pressure. Hijazi, MD at bedside.

## 2019-02-17 NOTE — ED Notes (Signed)
Spoke with charge nurse and was given phone number of nurse taking patient to give report to.  No answer to the phone when trying to give report

## 2019-02-17 NOTE — ED Notes (Signed)
Attempted report x2 with no answer.  Called from a different phone and was put on hold.

## 2019-02-17 NOTE — ED Notes (Signed)
pts daughter, sherry, would like pt to call her when possible at [phone]

## 2019-02-18 ENCOUNTER — Encounter (HOSPITAL_COMMUNITY): Payer: Self-pay | Admitting: Cardiology

## 2019-02-18 DIAGNOSIS — I4819 Other persistent atrial fibrillation: Secondary | ICD-10-CM

## 2019-02-18 DIAGNOSIS — I509 Heart failure, unspecified: Secondary | ICD-10-CM

## 2019-02-18 DIAGNOSIS — E0590 Thyrotoxicosis, unspecified without thyrotoxic crisis or storm: Secondary | ICD-10-CM

## 2019-02-18 DIAGNOSIS — I10 Essential (primary) hypertension: Secondary | ICD-10-CM

## 2019-02-18 LAB — BASIC METABOLIC PANEL
Anion gap: 13 (ref 5–15)
BUN: 9 mg/dL (ref 8–23)
CO2: 22 mmol/L (ref 22–32)
Calcium: 9.3 mg/dL (ref 8.9–10.3)
Chloride: 106 mmol/L (ref 98–111)
Creatinine, Ser: 0.85 mg/dL (ref 0.44–1.00)
GFR calc Af Amer: >60 mL/min (ref >60)
GFR calc non Af Amer: >60 mL/min (ref >60)
Glucose, Bld: 80 mg/dL (ref 70–99)
Potassium: 3.1 mmol/L — ABNORMAL LOW (ref 3.5–5.1)
Sodium: 141 mmol/L (ref 135–145)

## 2019-02-18 LAB — TROPONIN I
Troponin I: <0.03 ng/mL (ref <0.03)
Troponin I: <0.03 ng/mL (ref <0.03)

## 2019-02-18 MED ORDER — APIXABAN 2.5 MG PO TABS
2.5000 mg | ORAL_TABLET | Freq: Two times a day (BID) | ORAL | Status: DC
  Administered 2019-02-18: 18:00:00 2.5 mg via ORAL
  Filled 2019-02-18 (×2): qty 1

## 2019-02-18 MED ORDER — APIXABAN 2.5 MG PO TABS
2.5000 mg | ORAL_TABLET | Freq: Two times a day (BID) | ORAL | Status: DC
  Administered 2019-02-19: 2.5 mg via ORAL
  Filled 2019-02-18 (×2): qty 1

## 2019-02-18 NOTE — Progress Notes (Signed)
PROGRESS NOTE  Valerie [name] WJX:[id number] DOB: 03/21/1928 DOA: 02/17/2019 PCP: Thayer Headings, MD   LOS: 1 day   Brief narrative: Valerie [name]  is a 85 y.o. female, with past medical history significant for diastolic congestive heart failure, chronic, history of hypertension, hyperlipidemia, bradycardia, and hyperthyroidism on treatment presenting today with 10 days history of increasing shortness of breath that got worse for one day.  No chest pains, fever, chills. In the emergency room the patient was noted to be in A. fib and was in congestive heart failure.  Patient is not on anticoagulation and her last echo was done on 12/2018 showing  normal ejection fraction.  Patient was started on Lasix IV. COVID-19 test negative.  Subjective: Patient feels better with diuretics. Denies chest pain, dyspnea, palpitations, dizziness.   Assessment/Plan:  Active Problems:   Congestive heart failure (CHF) (HCC)  Congestive heart failure.  Patient presented with dyspnea and pleural effusion.   last echocardiogram on 01/16/19 with normal ejection fraction.  Has felt significantly better with Lasix IV twice a day.  With cardiology regarding consultation.  Troponins 3 sets were negative.  New onset A. Fibrillation.  Patient does have history of hypothyroidism but her TSH is within normal range.  Methimazole.  Spoke with cardiology.  Patient might need anticoagulation.  Hyperthyroidism Continue Tapazole.  TSH within normal limits  Hyperlipidemia Continue with Pravachol  CAD with old anterior MI  Continue with ASA and beta-blocker  VTE Prophylaxis: Lovenox  Code Status: DNR  Family Communication: I spoke with the patient's daughter on the phone and updated her about the clinical condition of the patient and the plan for cardiology follow-up  Disposition Plan:  Home 1 to 2 days.   Consultants:  Cardiology  Procedures:  None  Antibiotics: Anti-infectives (From admission, onward)   None      Objective: Vitals:   02/18/19 1157 02/18/19 1314  BP: (!) 154/71 (!) 152/60  Pulse: 67 88  Resp: 20 18  Temp:  98.4 F (36.9 C)  SpO2: 97% 96%    Intake/Output Summary (Last 24 hours) at 02/18/2019 1355 Last data filed at 02/18/2019 1300 Gross per 24 hour  Intake 633 ml  Output 200 ml  Net 433 ml   Filed Weights   02/17/19 1447 02/17/19 2005 02/18/19 0434  Weight: 49 kg 47.7 kg 46.3 kg   Body mass index is 17.51 kg/m.   Physical Exam: GENERAL: Patient is alert awake and oriented. Not in obvious distress.   HENT: No scleral pallor or icterus. Pupils equally reactive to light. Oral mucosa is moist NECK: is supple, no palpable thyroid enlargement. CHEST:  Diminished breath sounds bilaterally.  Mild basal crackles. CVS: S1 and S2 heard, no murmur. Regular rate and rhythm. No pericardial rub. ABDOMEN: Soft, non-tender, bowel sounds are present. No palpable hepato-splenomegaly. EXTREMITIES: Trace pedal edema. CNS: Cranial nerves are intact. No focal motor or sensory deficits. SKIN: warm and dry without rashes.  Data Review: I have personally reviewed the following laboratory data and studies,  CBC: Recent Labs  Lab 02/17/19 1542  WBC 8.1  NEUTROABS 5.6  HGB 11.7*  HCT 35.6*  MCV 94.9  PLT 213   Basic Metabolic Panel: Recent Labs  Lab 02/17/19 1542 02/18/19 0210  NA 141 141  K 3.6 3.1*  CL 108 106  CO2 21* 22  GLUCOSE 98 80  BUN 11 9  CREATININE 0.81 0.85  CALCIUM 9.3 9.3   Liver Function Tests: Recent Labs  Lab 02/17/19  1542  AST 18  ALT 16  ALKPHOS 83  BILITOT 0.6  PROT 5.9*  ALBUMIN 3.2*   No results for input(s): LIPASE, AMYLASE in the last 168 hours. No results for input(s): AMMONIA in the last 168 hours. Cardiac Enzymes: Recent Labs  Lab 02/17/19 1542 02/17/19 2033 02/18/19 0210 02/18/19 0746  TROPONINI <0.03 <0.03 <0.03 <0.03   BNP (last 3 results) Recent Labs    02/17/19 1542  BNP 914.9*    ProBNP (last 3  results) No results for input(s): PROBNP in the last 8760 hours.  CBG: No results for input(s): GLUCAP in the last 168 hours. Recent Results (from the past 240 hour(s))  SARS Coronavirus 2 (CEPHEID- Performed in [place] hospital lab), Hosp Order     Status: None   Collection Time: 02/17/19  3:42 PM  Result Value Ref Range Status   SARS Coronavirus 2 NEGATIVE NEGATIVE Final    Comment: (NOTE) If result is NEGATIVE SARS-CoV-2 target nucleic acids are NOT DETECTED. The SARS-CoV-2 RNA is generally detectable in upper and lower  respiratory specimens during the acute phase of infection. The lowest  concentration of SARS-CoV-2 viral copies this assay can detect is 250  copies / mL. A negative result does not preclude SARS-CoV-2 infection  and should not be used as the sole basis for treatment or other  patient management decisions.  A negative result may occur with  improper specimen collection / handling, submission of specimen other  than nasopharyngeal swab, presence of viral mutation(s) within the  areas targeted by this assay, and inadequate number of viral copies  (<250 copies / mL). A negative result must be combined with clinical  observations, patient history, and epidemiological information. If result is POSITIVE SARS-CoV-2 target nucleic acids are DETECTED. The SARS-CoV-2 RNA is generally detectable in upper and lower  respiratory specimens dur ing the acute phase of infection.  Positive  results are indicative of active infection with SARS-CoV-2.  Clinical  correlation with patient history and other diagnostic information is  necessary to determine patient infection status.  Positive results do  not rule out bacterial infection or co-infection with other viruses. If result is PRESUMPTIVE POSTIVE SARS-CoV-2 nucleic acids MAY BE PRESENT.   A presumptive positive result was obtained on the submitted specimen  and confirmed on repeat testing.  While 2019 novel coronavirus   (SARS-CoV-2) nucleic acids may be present in the submitted sample  additional confirmatory testing may be necessary for epidemiological  and / or clinical management purposes  to differentiate between  SARS-CoV-2 and other Sarbecovirus currently known to infect humans.  If clinically indicated additional testing with an alternate test  methodology [phone]) is advised. The SARS-CoV-2 RNA is generally  detectable in upper and lower respiratory sp ecimens during the acute  phase of infection. The expected result is Negative. Fact Sheet for Patients:  StrictlyIdeas.no Fact Sheet for Healthcare Providers: BankingDealers.co.za This test is not yet approved or cleared by the [place] FDA and has been authorized for detection and/or diagnosis of SARS-CoV-2 by FDA under an Emergency Use Authorization (EUA).  This EUA will remain in effect (meaning this test can be used) for the duration of the COVID-19 declaration under Section 564(b)(1) of the Act, 21 U.S.C. section 360bbb-3(b)(1), unless the authorization is terminated or revoked sooner. Performed at [facility], [place] [address] [place], [place] [address]      Studies: Dg Chest 2 View  Result Date: 02/17/2019 CLINICAL DATA:  85 year old female with  shortness of breath for 1 week EXAM: CHEST - 2 VIEW COMPARISON:  01/13/2019, 11/27/2018 FINDINGS: Cardiomediastinal silhouette unchanged. Interval development of bibasilar opacities with blunting of the costophrenic angles and meniscus on the lateral view. Interlobular septal thickening. No pneumothorax. Coarsened interstitial markings persist. Osteopenia with degenerative changes of the spine. No displaced fracture. Surgical changes of bilateral shoulder arthroplasty IMPRESSION: Congestive heart failure with small bilateral pleural effusions. Electronically Signed   By: Corrie Mckusick D.O.   On: 02/17/2019 16:24    Scheduled Meds: .  aspirin EC  81 mg Oral Daily  . carvedilol  3.125 mg Oral BID WC  . enoxaparin (LOVENOX) injection  40 mg Subcutaneous Q24H  . furosemide  20 mg Intravenous BID  . methimazole  5 mg Oral Daily  . pravastatin  10 mg Oral Daily  . sodium chloride flush  3 mL Intravenous Q12H  . [START ON 02/20/2019] Vitamin D (Ergocalciferol)  50,000 Units Oral Q7 days    Continuous Infusions: . sodium chloride       Flora Lipps, MD  [facility] 02/18/2019

## 2019-02-18 NOTE — Consult Note (Signed)
Cardiology Consultation:   Patient ID: PATRIC VANPELT MRN: [id number]; DOB: [birth year]  Admit date: 02/17/2019 Date of Consult: 02/18/2019  Primary Care Provider: No primary care provider on file. Primary Cardiologist: Mertie Moores, MD    Patient Profile:   Valerie [name] is a 85 y.o. female with a hx of hypertension, hyperlipidemia,  who is being seen today for the evaluation of atrial fibrillation and CHF at the request of Flora Lipps MD.  History of Present Illness:   Nuclear study April 2015 showed no ischemia.  Monitor November 2019 showed sinus rhythm with occasional PVCs and 4 beats nonsustained ventricular tachycardia.  Last echocardiogram April 2020 showed normal LV function, moderate left atrial enlargement, moderate to severe aortic insufficiency.  Recently discharged following admission for weakness and near syncope.  TSH was noted to be low and she was started on atenolol and methimazole.  Episode of presyncope felt secondary to orthostasis.  Patient admitted last evening with complaints of dyspnea and new onset atrial fibrillation and cardiology asked to evaluate. Pt states that for the past 3 weeks she has had dyspnea with bending over and orthopnea; no CP; also with palpitations; no syncope; no bleeding.   Past Medical History:  Diagnosis Date  . Anxiety   . Arthritis   . Broken ribs   . Carotid artery disease ([place])   . Cataract   . Collar bone fracture    Right  . Depression   . Diverticulosis of colon with hemorrhage   . Duodenitis   . Esophageal reflux   . Esophageal stricture   . Essential hypertension   . Glaucoma   . Hiatal hernia   . History of kidney stones     x1   . Hyperlipidemia   . IBS (irritable bowel syndrome)   . Palpitations   . UTI (urinary tract infection) March 2015    Past Surgical History:  Procedure Laterality Date  . CHOLECYSTECTOMY    . COLONOSCOPY    . DILATION AND CURETTAGE OF UTERUS    . EYE SURGERY Bilateral    total of 5   . LASER PHOTO ABLATION Right 10/20/2016   Procedure: LASER PHOTO ABLATION;  Surgeon: Hayden Pedro, MD;  Location: [facility];  Service: Ophthalmology;  Laterality: Right;  Headscope laser  . LEFT ROTATOR CUFF REPAIR X2 Left   . PARS PLANA VITRECTOMY  10/20/2016   WITH 25G REMOVAL/SUTURE SECONDARY INTRAOCULAR LENS, GAS FLUID EXCHANGE   . PARS PLANA VITRECTOMY Right 10/20/2016   Procedure: PARS PLANA VITRECTOMY WITH 25G REMOVAL/SUTURE SECONDARY INTRAOCULAR LENS, GAS FLUID EXCHANGE;  Surgeon: Hayden Pedro, MD;  Location: [facility];  Service: Ophthalmology;  Laterality: Right;  . RIGHT ROTATOR CUFF REPAIR X1 Right   . UPPER GASTROINTESTINAL ENDOSCOPY     with dilation     Inpatient Medications: Scheduled Meds: . aspirin EC  81 mg Oral Daily  . carvedilol  3.125 mg Oral BID WC  . enoxaparin (LOVENOX) injection  40 mg Subcutaneous Q24H  . furosemide  20 mg Intravenous BID  . methimazole  5 mg Oral Daily  . pravastatin  10 mg Oral Daily  . sodium chloride flush  3 mL Intravenous Q12H  . [START ON 02/20/2019] Vitamin D (Ergocalciferol)  50,000 Units Oral Q7 days   Continuous Infusions: . sodium chloride     PRN Meds: sodium chloride, acetaminophen, metoprolol tartrate, nitroGLYCERIN, sodium chloride flush  Allergies:    Allergies  Allergen Reactions  . Aricept [Donepezil Hcl] Other (See  Comments)    Nightmares, near syncope, weak, decreased appetite.  . Lisinopril Other (See Comments)    Hair loss  . Sulfa Antibiotics Nausea Only  . Sulfasalazine Nausea Only    Social History:   Social History   Socioeconomic History  . Marital status: Widowed    Spouse name: Not on file  . Number of children: Not on file  . Years of education: Not on file  . Highest education level: Not on file  Occupational History  . Occupation: retired    Fish farm manager: RETIRED  Social Needs  . Financial resource strain: Not on file  . Food insecurity:    Worry: Not on file    Inability: Not on file  .  Transportation needs:    Medical: Not on file    Non-medical: Not on file  Tobacco Use  . Smoking status: Never Smoker  . Smokeless tobacco: Never Used  Substance and Sexual Activity  . Alcohol use: No  . Drug use: No  . Sexual activity: Not on file  Lifestyle  . Physical activity:    Days per week: Not on file    Minutes per session: Not on file  . Stress: Not on file  Relationships  . Social connections:    Talks on phone: Not on file    Gets together: Not on file    Attends religious service: Not on file    Active member of club or organization: Not on file    Attends meetings of clubs or organizations: Not on file    Relationship status: Not on file  . Intimate partner violence:    Fear of current or ex partner: Not on file    Emotionally abused: Not on file    Physically abused: Not on file    Forced sexual activity: Not on file  Other Topics Concern  . Not on file  Social History Narrative  . Not on file    Family History:    Family History  Problem Relation Age of Onset  . Leukemia Mother   . Colon cancer Father 58  . Kidney cancer Brother   . Bladder Cancer Brother   . Stroke Sister   . Esophageal cancer Neg Hx   . Stomach cancer Neg Hx      ROS:  Please see the history of present illness.  No fevers, chills; mildly productive cough. All other ROS reviewed and negative.     Physical Exam/Data:   Vitals:   02/18/19 0145 02/18/19 0434 02/18/19 1157 02/18/19 1314  BP: (!) 162/85 (!) 160/94 (!) 154/71 (!) 152/60  Pulse: 86  67 88  Resp:  16 20 18   Temp:  98.1 F (36.7 C)  98.4 F (36.9 C)  TempSrc:  Oral  Oral  SpO2:  96% 97% 96%  Weight:  46.3 kg    Height:        Intake/Output Summary (Last 24 hours) at 02/18/2019 1410 Last data filed at 02/18/2019 1300 Gross per 24 hour  Intake 633 ml  Output 200 ml  Net 433 ml   Last 3 Weights 02/18/2019 02/17/2019 02/17/2019  Weight (lbs) 102 lb 105 lb 2.6 oz 108 lb  Weight (kg) 46.267 kg 47.7 kg 48.988  kg     Body mass index is 17.51 kg/m.  General:  Well nourished, thin, in no acute distress HEENT: normal Neck: no JVD Endocrine:  No thryomegaly Vascular: No carotid bruits; FA pulses 2+ bilaterally without bruits  Cardiac:  normal  S1, S2; irregular Lungs:  clear to auscultation bilaterally, no wheezing, rhonchi or rales  Abd: soft, nontender, no hepatomegaly  Ext: no edema Musculoskeletal:  No deformities, BUE and BLE strength normal and equal Skin: warm and dry  Neuro:  CNs 2-12 intact, no focal abnormalities noted Psych:  Normal affect   EKG:  The EKG was personally reviewed and demonstrates: Atrial fibrillation. Telemetry:  Telemetry was personally reviewed and demonstrates: Atrial fibrillation.   Laboratory Data:  Chemistry Recent Labs  Lab 02/17/19 1542 02/18/19 0210  NA 141 141  K 3.6 3.1*  CL 108 106  CO2 21* 22  GLUCOSE 98 80  BUN 11 9  CREATININE 0.81 0.85  CALCIUM 9.3 9.3  GFRNONAA >60 >60  GFRAA >60 >60  ANIONGAP 12 13    Recent Labs  Lab 02/17/19 1542  PROT 5.9*  ALBUMIN 3.2*  AST 18  ALT 16  ALKPHOS 83  BILITOT 0.6   Hematology Recent Labs  Lab 02/17/19 1542  WBC 8.1  RBC 3.75*  HGB 11.7*  HCT 35.6*  MCV 94.9  MCH 31.2  MCHC 32.9  RDW 13.6  PLT 350   Cardiac Enzymes Recent Labs  Lab 02/17/19 1542 02/17/19 2033 02/18/19 0210 02/18/19 0746  TROPONINI <0.03 <0.03 <0.03 <0.03   BNP Recent Labs  Lab 02/17/19 1542  BNP 914.9*     Radiology/Studies:  Dg Chest 2 View  Result Date: 02/17/2019 CLINICAL DATA:  85 year old female with shortness of breath for 1 week EXAM: CHEST - 2 VIEW COMPARISON:  01/13/2019, 11/27/2018 FINDINGS: Cardiomediastinal silhouette unchanged. Interval development of bibasilar opacities with blunting of the costophrenic angles and meniscus on the lateral view. Interlobular septal thickening. No pneumothorax. Coarsened interstitial markings persist. Osteopenia with degenerative changes of the spine. No  displaced fracture. Surgical changes of bilateral shoulder arthroplasty IMPRESSION: Congestive heart failure with small bilateral pleural effusions. Electronically Signed   By: Corrie Mckusick D.O.   On: 02/17/2019 16:24    Assessment and Plan:   1 New onset atrial fibrillation-pt now with newly diagnosed atrial fibrillation; continue coreg for rate control; CHADSvasc 4; will DC lovenox; add apixaban 2.5 mg BID; recent hyperthyroid may have contributed. Would likely DCCV in 4-6 weeks. Recent echo shows normal LV function. TSH now normal.  2 acute on chronic diastolic dysfunction-continue IV lasix and follow renal function; likely partially related to atrial fibrillation.  3 hyperthyroidism-continue methimazole.  Management per primary care.  4 hypertension-blood pressure is mildly elevated.  Medications were recently decreased because of orthostasis.  We will follow and adjust regimen as needed.  5 history of moderate to severe aortic insufficiency-conservative measures given age.  She will need follow-up echoes in the future.  6 hyperlipidemia-continue statin.   For questions or updates, please contact [facility] Please consult [email or web address] for contact info under     Signed, Kirk Ruths, MD  02/18/2019 2:10 PM

## 2019-02-18 NOTE — Plan of Care (Signed)
  Problem: Clinical Measurements: Goal: Ability to maintain clinical measurements within normal limits will improve Outcome: Progressing   Problem: Clinical Measurements: Goal: Respiratory complications will improve Outcome: Progressing   Problem: Clinical Measurements: Goal: Cardiovascular complication will be avoided Outcome: Progressing   

## 2019-02-19 ENCOUNTER — Other Ambulatory Visit: Payer: Self-pay | Admitting: Nurse Practitioner

## 2019-02-19 DIAGNOSIS — I4819 Other persistent atrial fibrillation: Secondary | ICD-10-CM

## 2019-02-19 DIAGNOSIS — I480 Paroxysmal atrial fibrillation: Secondary | ICD-10-CM

## 2019-02-19 LAB — CBC
HCT: 38.8 % (ref 36.0–46.0)
Hemoglobin: 13 g/dL (ref 12.0–15.0)
MCH: 31.1 pg (ref 26.0–34.0)
MCHC: 33.5 g/dL (ref 30.0–36.0)
MCV: 92.8 fL (ref 80.0–100.0)
Platelets: 402 10*3/uL — ABNORMAL HIGH (ref 150–400)
RBC: 4.18 MIL/uL (ref 3.87–5.11)
RDW: 13.3 % (ref 11.5–15.5)
WBC: 7.4 10*3/uL (ref 4.0–10.5)
nRBC: 0 % (ref 0.0–0.2)

## 2019-02-19 LAB — BASIC METABOLIC PANEL
Anion gap: 15 (ref 5–15)
BUN: 20 mg/dL (ref 8–23)
CO2: 25 mmol/L (ref 22–32)
Calcium: 9.4 mg/dL (ref 8.9–10.3)
Chloride: 100 mmol/L (ref 98–111)
Creatinine, Ser: 0.91 mg/dL (ref 0.44–1.00)
GFR calc Af Amer: >60 mL/min (ref >60)
GFR calc non Af Amer: 56 mL/min — ABNORMAL LOW (ref >60)
Glucose, Bld: 90 mg/dL (ref 70–99)
Potassium: 3.4 mmol/L — ABNORMAL LOW (ref 3.5–5.1)
Sodium: 140 mmol/L (ref 135–145)

## 2019-02-19 LAB — MAGNESIUM: Magnesium: 1.6 mg/dL — ABNORMAL LOW (ref 1.7–2.4)

## 2019-02-19 LAB — PHOSPHORUS: Phosphorus: 4 mg/dL (ref 2.5–4.6)

## 2019-02-19 MED ORDER — APIXABAN 2.5 MG PO TABS: 2.5000 mg | ORAL_TABLET | Freq: Two times a day (BID) | ORAL | 2 refills | Status: DC

## 2019-02-19 MED ORDER — MAGNESIUM SULFATE 2 GM/50ML IV SOLN
2.0000 g | Freq: Once | INTRAVENOUS | Status: AC
  Administered 2019-02-19: 16:00:00 2 g via INTRAVENOUS
  Filled 2019-02-19: qty 50

## 2019-02-19 MED ORDER — MAGNESIUM OXIDE 400 (241.3 MG) MG PO TABS: 400.0000 mg | ORAL_TABLET | Freq: Two times a day (BID) | ORAL | 0 refills | Status: AC

## 2019-02-19 MED ORDER — POTASSIUM CHLORIDE ER 10 MEQ PO TBCR: 10.0000 meq | EXTENDED_RELEASE_TABLET | Freq: Every day | ORAL | 0 refills | Status: DC

## 2019-02-19 MED ORDER — FUROSEMIDE 20 MG PO TABS: 20.0000 mg | ORAL_TABLET | Freq: Every day | ORAL | 2 refills | Status: DC

## 2019-02-19 MED ORDER — MAGNESIUM OXIDE 400 (241.3 MG) MG PO TABS: 400.0000 mg | ORAL_TABLET | Freq: Two times a day (BID) | ORAL | Status: DC

## 2019-02-19 MED ORDER — CARVEDILOL 6.25 MG PO TABS: 6.2500 mg | ORAL_TABLET | Freq: Two times a day (BID) | ORAL | Status: DC

## 2019-02-19 MED ORDER — FUROSEMIDE 20 MG PO TABS
20.0000 mg | ORAL_TABLET | Freq: Every day | ORAL | Status: DC
  Administered 2019-02-19: 09:00:00 20 mg via ORAL
  Filled 2019-02-19: qty 1

## 2019-02-19 MED ORDER — MAGNESIUM OXIDE 400 (241.3 MG) MG PO TABS: 400.0000 mg | ORAL_TABLET | Freq: Two times a day (BID) | ORAL | 0 refills | Status: DC

## 2019-02-19 MED ORDER — POTASSIUM CHLORIDE CRYS ER 20 MEQ PO TBCR
40.0000 meq | EXTENDED_RELEASE_TABLET | Freq: Once | ORAL | Status: AC
  Administered 2019-02-19: 09:00:00 40 meq via ORAL
  Filled 2019-02-19: qty 2

## 2019-02-19 MED ORDER — CARVEDILOL 6.25 MG PO TABS: 6.2500 mg | ORAL_TABLET | Freq: Two times a day (BID) | ORAL | 2 refills | Status: DC

## 2019-02-19 MED ORDER — AMLODIPINE BESYLATE 5 MG PO TABS: 5.0000 mg | ORAL_TABLET | Freq: Every day | ORAL | 2 refills | Status: DC

## 2019-02-19 MED ORDER — AMLODIPINE BESYLATE 2.5 MG PO TABS
5.0000 mg | ORAL_TABLET | Freq: Every day | ORAL | Status: DC
  Administered 2019-02-19: 5 mg via ORAL
  Filled 2019-02-19: qty 2

## 2019-02-19 NOTE — Discharge Summary (Signed)
Physician Discharge Summary  VEORA FONTE NGE:[id number] DOB: 06/11/1928 DOA: 02/17/2019  PCP: No primary care provider on file.  Admit date: 02/17/2019 Discharge date: 02/19/2019  Admitted From: Home  Discharge disposition: Home  Recommendations for Outpatient Follow-Up:   Follow up with your primary care provider, cardiology as outpatient.  Discharge Diagnosis:   Active Problems:   Congestive heart failure (CHF) ([place]) new onset atrial fibrillation Hypokalemia Hyperthyroidism  Discharge Condition: Improved.  Diet recommendation: Low sodium, heart healthy.    Wound care: None.  Code status: Full.   History of Present Illness:   RuthSmithis a85 y.o.female,with past medical history significant for diastolic congestive heart failure, history of hypertension, hyperlipidemia, bradycardia, and hyperthyroidism on treatment presenting today with 10 days history of increasing shortness of breath that got worse for one day. No chest pains, fever, chills. In the emergency room, the patient was noted to be in A. fib and was in congestive heart failure. Patient is not on anticoagulation and her last echo was done on 12/2018 showing  normal ejection fraction. Patient was started on Lasix IV. COVID-19 test negative.  Hospital Course:  Patient was admitted to hospital and following conditions were addressed during hospitalization,  Acute on chronic diastolic congestive heart failure.  Patient presented with dyspnea and pleural effusion.  2D echocardiogram on 01/16/19 with normal ejection fraction.    Patient was seen by cardiology.    Troponins 3 sets were negative.   Patient was changed to oral Lasix on discharge.  New onset Atrial fibrillation.     Patient was adjusted on Coreg during hospitalization.  Eliquis was started for anticoagulation.   Patient does have history of hyperthyroidism but her TSH is within normal range.  Continue methimazole.    Hyperthyroidism. Continue  Tapazole.  TSH within normal limits  Hyperlipidemia. Continue with Pravachol    CAD with old anterior MI.  Patient did not have any chest pain.  Troponins were negative.  Continue with ASA and beta-blocker  Disposition: Patient was advised to remain in the hospital for closer monitoring of her atrial fibrillation but she and her family insisted on getting discharged.  Patient was upset and crying about the need for being in the hospital, so she was discharged home.  She was however advised to follow-up with her primary care physician and cardiology as outpatient.   Medical Consultants:    Cardiology  Subjective:   Today, patient feels better. Wants to go home. Dyspnea has improved.  Denies chest pain, dizziness, lightheadedness.  Discharge Exam:   Vitals:   02/19/19 1118 02/19/19 1512  BP: (!) 174/144 (!) 151/94  Pulse: 77 80  Resp:    Temp:    SpO2:  96%   Vitals:   02/19/19 0814 02/19/19 1117 02/19/19 1118 02/19/19 1512  BP: (!) 157/90 (!) 164/117 (!) 174/144 (!) 151/94  Pulse: 82 (!) 56 77 80  Resp:  15    Temp:  98.6 F (37 C)    TempSrc:  Oral    SpO2:  97%  96%  Weight:      Height:        General exam: Appears calm and comfortable ,Not in distress HEENT:PERRL,Oral mucosa moist Respiratory system: Diminished breath sounds bilaterally Cardiovascular system: S1 & S2 heard, Irregularly irregular rhythm. Gastrointestinal system: Abdomen is nondistended, soft and nontender. No organomegaly or masses felt. Normal bowel sounds heard. Central nervous system: Alert and oriented. No focal neurological deficits. Extremities: No edema, no clubbing ,no cyanosis, distal peripheral  pulses palpable. Skin: No rashes, lesions or ulcers,no icterus ,no pallor MSK: Normal muscle bulk,tone ,power    Procedures:    none  The results of significant diagnostics from this hospitalization (including imaging, microbiology, ancillary and laboratory) are listed below for  reference.     Diagnostic Studies:   Dg Chest 2 View  Result Date: 02/17/2019 CLINICAL DATA:  85 year old female with shortness of breath for 1 week EXAM: CHEST - 2 VIEW COMPARISON:  01/13/2019, 11/27/2018 FINDINGS: Cardiomediastinal silhouette unchanged. Interval development of bibasilar opacities with blunting of the costophrenic angles and meniscus on the lateral view. Interlobular septal thickening. No pneumothorax. Coarsened interstitial markings persist. Osteopenia with degenerative changes of the spine. No displaced fracture. Surgical changes of bilateral shoulder arthroplasty IMPRESSION: Congestive heart failure with small bilateral pleural effusions. Electronically Signed   By: Corrie Mckusick D.O.   On: 02/17/2019 16:24    Labs:   Basic Metabolic Panel: Recent Labs  Lab 02/17/19 1542 02/18/19 0210 02/19/19 0341  NA 141 141 140  K 3.6 3.1* 3.4*  CL 108 106 100  CO2 21* 22 25  GLUCOSE 98 80 90  BUN 11 9 20   CREATININE 0.81 0.85 0.91  CALCIUM 9.3 9.3 9.4  MG  --   --  1.6*  PHOS  --   --  4.0   GFR Estimated Creatinine Clearance: 30.7 mL/min (by C-G formula based on SCr of 0.91 mg/dL). Liver Function Tests: Recent Labs  Lab 02/17/19 1542  AST 18  ALT 16  ALKPHOS 83  BILITOT 0.6  PROT 5.9*  ALBUMIN 3.2*   No results for input(s): LIPASE, AMYLASE in the last 168 hours. No results for input(s): AMMONIA in the last 168 hours. Coagulation profile No results for input(s): INR, PROTIME in the last 168 hours.  CBC: Recent Labs  Lab 02/17/19 1542 02/19/19 0341  WBC 8.1 7.4  NEUTROABS 5.6  --   HGB 11.7* 13.0  HCT 35.6* 38.8  MCV 94.9 92.8  PLT 350 402*   Cardiac Enzymes: Recent Labs  Lab 02/17/19 1542 02/17/19 2033 02/18/19 0210 02/18/19 0746  TROPONINI <0.03 <0.03 <0.03 <0.03   BNP: Invalid input(s): POCBNP CBG: No results for input(s): GLUCAP in the last 168 hours. D-Dimer No results for input(s): DDIMER in the last 72 hours. Hgb A1c No results  for input(s): HGBA1C in the last 72 hours. Lipid Profile No results for input(s): CHOL, HDL, LDLCALC, TRIG, CHOLHDL, LDLDIRECT in the last 72 hours. Thyroid function studies Recent Labs    02/17/19 2033  TSH 0.537   Anemia work up No results for input(s): VITAMINB12, FOLATE, FERRITIN, TIBC, IRON, RETICCTPCT in the last 72 hours. Microbiology Recent Results (from the past 240 hour(s))  SARS Coronavirus 2 (CEPHEID- Performed in [place] hospital lab), Hosp Order     Status: None   Collection Time: 02/17/19  3:42 PM  Result Value Ref Range Status   SARS Coronavirus 2 NEGATIVE NEGATIVE Final    Comment: (NOTE) If result is NEGATIVE SARS-CoV-2 target nucleic acids are NOT DETECTED. The SARS-CoV-2 RNA is generally detectable in upper and lower  respiratory specimens during the acute phase of infection. The lowest  concentration of SARS-CoV-2 viral copies this assay can detect is 250  copies / mL. A negative result does not preclude SARS-CoV-2 infection  and should not be used as the sole basis for treatment or other  patient management decisions.  A negative result may occur with  improper specimen collection / handling, submission of  specimen other  than nasopharyngeal swab, presence of viral mutation(s) within the  areas targeted by this assay, and inadequate number of viral copies  (<250 copies / mL). A negative result must be combined with clinical  observations, patient history, and epidemiological information. If result is POSITIVE SARS-CoV-2 target nucleic acids are DETECTED. The SARS-CoV-2 RNA is generally detectable in upper and lower  respiratory specimens dur ing the acute phase of infection.  Positive  results are indicative of active infection with SARS-CoV-2.  Clinical  correlation with patient history and other diagnostic information is  necessary to determine patient infection status.  Positive results do  not rule out bacterial infection or co-infection with other  viruses. If result is PRESUMPTIVE POSTIVE SARS-CoV-2 nucleic acids MAY BE PRESENT.   A presumptive positive result was obtained on the submitted specimen  and confirmed on repeat testing.  While 2019 novel coronavirus  (SARS-CoV-2) nucleic acids may be present in the submitted sample  additional confirmatory testing may be necessary for epidemiological  and / or clinical management purposes  to differentiate between  SARS-CoV-2 and other Sarbecovirus currently known to infect humans.  If clinically indicated additional testing with an alternate test  methodology [phone]) is advised. The SARS-CoV-2 RNA is generally  detectable in upper and lower respiratory sp ecimens during the acute  phase of infection. The expected result is Negative. Fact Sheet for Patients:  StrictlyIdeas.no Fact Sheet for Healthcare Providers: BankingDealers.co.za This test is not yet approved or cleared by the [place] FDA and has been authorized for detection and/or diagnosis of SARS-CoV-2 by FDA under an Emergency Use Authorization (EUA).  This EUA will remain in effect (meaning this test can be used) for the duration of the COVID-19 declaration under Section 564(b)(1) of the Act, 21 U.S.C. section 360bbb-3(b)(1), unless the authorization is terminated or revoked sooner. Performed at [facility], [place] [address] [place], [place] [address]      Discharge Instructions:   Discharge Instructions    Diet - low sodium heart healthy   Complete by:  As directed    Discharge instructions   Complete by:  As directed    Follow up with cardiology as outpatient in 3-4 weeks. Take medications as prescribed. Check blood work within one week (cardiology office to arrange). Follow up with your primary care provider for general check up.Take precautions on blood thinners.   Increase activity slowly   Complete by:  As directed      Allergies as of 02/19/2019       Reactions   Aricept [donepezil Hcl] Other (See Comments)   Nightmares, near syncope, weak, decreased appetite.   Lisinopril Other (See Comments)   Hair loss   Sulfa Antibiotics Nausea Only   Sulfasalazine Nausea Only      Medication List    STOP taking these medications   atenolol 25 MG tablet Commonly known as:  TENORMIN     TAKE these medications   acetaminophen 325 MG tablet Commonly known as:  TYLENOL Take 650 mg by mouth every 6 (six) hours as needed for headache (pain).   amLODipine 5 MG tablet Commonly known as:  NORVASC Take 1 tablet (5 mg total) by mouth daily. Start taking on:  Feb 20, 2019   apixaban 2.5 MG Tabs tablet Commonly known as:  ELIQUIS Take 1 tablet (2.5 mg total) by mouth 2 (two) times daily.   aspirin EC 81 MG tablet Take 81 mg by mouth daily.   carvedilol 6.25  MG tablet Commonly known as:  COREG Take 1 tablet (6.25 mg total) by mouth 2 (two) times daily with a meal.   fluticasone 50 MCG/ACT nasal spray Commonly known as:  FLONASE Place 2 sprays into both nostrils as needed for allergies or rhinitis.   furosemide 20 MG tablet Commonly known as:  LASIX Take 1 tablet (20 mg total) by mouth daily. Start taking on:  Feb 20, 2019   magnesium oxide 400 (241.3 Mg) MG tablet Commonly known as:  MAG-OX Take 1 tablet (400 mg total) by mouth 2 (two) times daily for 10 days.   methimazole 5 MG tablet Commonly known as:  TAPAZOLE Take 1 tablet (5 mg total) by mouth daily.   nitroGLYCERIN 0.4 MG SL tablet Commonly known as:  NITROSTAT Place 1 tablet (0.4 mg total) under the tongue every 5 (five) minutes as needed for chest pain.   potassium chloride 10 MEQ tablet Commonly known as:  K-DUR Take 1 tablet (10 mEq total) by mouth daily.   pravastatin 10 MG tablet Commonly known as:  PRAVACHOL TAKE 1 TABLET BY MOUTH ONCE DAILY   Vitamin D (Ergocalciferol) 1.25 MG (50000 UT) Caps capsule Commonly known as:  DRISDOL Take 1 capsule (50,000  Units total) by mouth every 7 (seven) days. What changed:  when to take this      Follow-up Information    Nahser, Wonda Cheng, MD.   Specialty:  Cardiology Why:  We will arrange for follow-up blood work in 1 week and follow-up office visit with Dr. Acie Fredrickson in approx 3-4 wks.  We will contact you. Contact information: [place] [address] [phone]           Time coordinating discharge: 39 minutes  Signed:  Shastina Rua  [facility] 02/19/2019, 3:45 PM

## 2019-02-19 NOTE — Progress Notes (Signed)
Progress Note  Patient Name: Valerie [name] Date of Encounter: 02/19/2019  Primary Cardiologist: Mertie Moores, MD   Subjective   Dyspnea resolved; no CP  Inpatient Medications    Scheduled Meds: . apixaban  2.5 mg Oral BID  . carvedilol  3.125 mg Oral BID WC  . furosemide  20 mg Intravenous BID  . methimazole  5 mg Oral Daily  . pravastatin  10 mg Oral Daily  . sodium chloride flush  3 mL Intravenous Q12H  . [START ON 02/20/2019] Vitamin D (Ergocalciferol)  50,000 Units Oral Q7 days   Continuous Infusions: . sodium chloride     PRN Meds: sodium chloride, acetaminophen, metoprolol tartrate, nitroGLYCERIN, sodium chloride flush   Vital Signs    Vitals:   02/18/19 1314 02/19/19 0440 02/19/19 0440 02/19/19 0555  BP: (!) 152/60  (!) 166/102 (!) 189/86  Pulse: 88  (!) 51 92  Resp: 18  18   Temp: 98.4 F (36.9 C)  98 F (36.7 C)   TempSrc: Oral  Oral   SpO2: 96%  93%   Weight:  46.4 kg    Height:        Intake/Output Summary (Last 24 hours) at 02/19/2019 0743 Last data filed at 02/19/2019 0200 Gross per 24 hour  Intake 586 ml  Output 1150 ml  Net -564 ml   Last 3 Weights 02/19/2019 02/18/2019 02/17/2019  Weight (lbs) 102 lb 6.4 oz 102 lb 105 lb 2.6 oz  Weight (kg) 46.448 kg 46.267 kg 47.7 kg      Telemetry    Atrial fibrillation rate upper normal with occasional tachycardia - Personally Reviewed  Physical Exam   GEN: Frail No acute distress.   Neck: No JVD Cardiac: irregular Respiratory: Clear to auscultation bilaterally. GI: Soft, nontender, non-distended  MS: No edema Neuro:  Nonfocal  Psych: Normal affect   Labs    Chemistry Recent Labs  Lab 02/17/19 1542 02/18/19 0210 02/19/19 0341  NA 141 141 140  K 3.6 3.1* 3.4*  CL 108 106 100  CO2 21* 22 25  GLUCOSE 98 80 90  BUN 11 9 20   CREATININE 0.81 0.85 0.91  CALCIUM 9.3 9.3 9.4  PROT 5.9*  --   --   ALBUMIN 3.2*  --   --   AST 18  --   --   ALT 16  --   --   ALKPHOS 83  --   --   BILITOT  0.6  --   --   GFRNONAA >60 >60 56*  GFRAA >60 >60 >60  ANIONGAP 12 13 15      Hematology Recent Labs  Lab 02/17/19 1542 02/19/19 0341  WBC 8.1 7.4  RBC 3.75* 4.18  HGB 11.7* 13.0  HCT 35.6* 38.8  MCV 94.9 92.8  MCH 31.2 31.1  MCHC 32.9 33.5  RDW 13.6 13.3  PLT 350 402*    Cardiac Enzymes Recent Labs  Lab 02/17/19 1542 02/17/19 2033 02/18/19 0210 02/18/19 0746  TROPONINI <0.03 <0.03 <0.03 <0.03    BNP Recent Labs  Lab 02/17/19 1542  BNP 914.9*       Radiology    Dg Chest 2 View  Result Date: 02/17/2019 CLINICAL DATA:  85 year old female with shortness of breath for 1 week EXAM: CHEST - 2 VIEW COMPARISON:  01/13/2019, 11/27/2018 FINDINGS: Cardiomediastinal silhouette unchanged. Interval development of bibasilar opacities with blunting of the costophrenic angles and meniscus on the lateral view. Interlobular septal thickening. No pneumothorax. Coarsened interstitial markings persist.  Osteopenia with degenerative changes of the spine. No displaced fracture. Surgical changes of bilateral shoulder arthroplasty IMPRESSION: Congestive heart failure with small bilateral pleural effusions. Electronically Signed   By: Corrie Mckusick D.O.   On: 02/17/2019 16:24    Patient Profile     Valerie [name] is a 85 y.o. female with a hx of hypertension, hyperlipidemia,  who is being seen for the evaluation of atrial fibrillation and CHF. Last echocardiogram April 2020 showed normal LV function, moderate left atrial enlargement, moderate to severe aortic insufficiency.    Assessment & Plan    1 New onset atrial fibrillation-heart rate upper normal to mildly elevated.  Increase carvedilol to 6.25 mg twice daily.  Continue apixaban 2.5 mg twice daily.  Recent echocardiogram shows normal LV function.  TSH has normalized.  I will have patient follow-up with Dr. Acie Fredrickson in 2 to 4 weeks.  Decision can be made at that time whether to continue with rate control versus attempt at cardioversion.   If she is asymptomatic best option may be rate control and anticoagulation long-term.    2 acute on chronic diastolic dysfunction- likely partially related to atrial fibrillation.  Change Lasix to 20 mg p.o. daily.  3 hyperthyroidism-continue methimazole.  Management per primary care.  4 hypertension-blood pressure is elevated.    Add amlodipine 5 mg daily.  Follow blood pressure as an outpatient and adjust regimen as needed.  5 history of moderate to severe aortic insufficiency-conservative measures given age.  She will need follow-up echoes in the future.  6 hyperlipidemia-continue statin.   7 hypokalemia-supplement  [facility] will sign off.   Medication Recommendations:  As outlined in [facility] Other recommendations (labs, testing, etc):  BMET one week following DC Follow up as an outpatient:  Dr Acie Fredrickson 2-4 weeks  For questions or updates, please contact [facility] Please consult [email or web address] for contact info under        Signed, Kirk Ruths, MD  02/19/2019, 7:43 AM

## 2019-02-19 NOTE — Evaluation (Signed)
Physical Therapy Evaluation Patient Details Name: Valerie [name] MRN: [id number] DOB: [birth year] Today's Date: 02/19/2019   History of Present Illness  85 y.o. female, with past medical history significant for diastolic congestive heart failure, chronic, history of hypertension, hyperlipidemia, bradycardia, and hyperthyroidism. Admitted for shortness of breath with CHF  Clinical Impression  Pt pleasant in chair on arrival with noted decreased cognition. Pt not oriented to place, having great difficulty with short term memory and problem solving. Pt is normally home alone during the day, still driving and does not use DME. Pt currently with hesitation and decreased balance with mobility as well as decreased strength and function who will benefit from acute therapy to maximize mobility and independence.   HR 92-115 during majority of gait with 2 brief spikes to 125, 131 respectively    Follow Up Recommendations Home health PT;Supervision/Assistance - 24 hour    Equipment Recommendations  None recommended by PT    Recommendations for Other Services OT consult     Precautions / Restrictions Precautions Precautions: Fall      Mobility  Bed Mobility               General bed mobility comments: in chair on arrival  Transfers Overall transfer level: Needs assistance   Transfers: Sit to/from Stand Sit to Stand: Min guard         General transfer comment: guarding for safety with cues to back fully to surface prior to sitting.   Ambulation/Gait Ambulation/Gait assistance: Min guard Gait Distance (Feet): 320 Feet Assistive device: None Gait Pattern/deviations: Step-through pattern;Decreased stride length;Narrow base of support   Gait velocity interpretation: 1.31 - 2.62 ft/sec, indicative of limited community ambulator General Gait Details: pt with very guarded cautious gait maintaining arms tucked into chest, short narrow and slow strides. Pt reports she normally walks  faster but unable to engage arm swing or increased stride with cues  Stairs            Wheelchair Mobility    Modified Rankin (Stroke Patients Only)       Balance Overall balance assessment: Needs assistance   Sitting balance-Leahy Scale: Good       Standing balance-Leahy Scale: Good Standing balance comment: guarding for balance during gait                             Pertinent Vitals/Pain Pain Assessment: No/denies pain    Home Living Family/patient expects to be discharged to:: Private residence Living Arrangements: Other relatives Available Help at Discharge: Family;Available PRN/intermittently Type of Home: House Home Access: Stairs to enter   Entrance Stairs-Number of Steps: 2 Home Layout: One level Home Equipment: Grab bars - tub/shower;Walker - 2 wheels;Cane - single point;Bedside commode Additional Comments: lives with her grandson still drives    Prior Function Level of Independence: Independent         Comments: independent with ADLs, mobility and IADLs      Hand Dominance        Extremity/Trunk Assessment   Upper Extremity Assessment Upper Extremity Assessment: Generalized weakness    Lower Extremity Assessment Lower Extremity Assessment: Generalized weakness    Cervical / Trunk Assessment Cervical / Trunk Assessment: Kyphotic  Communication   Communication: No difficulties  Cognition Arousal/Alertness: Awake/alert Behavior During Therapy: WFL for tasks assessed/performed Overall Cognitive Status: Impaired/Different from baseline Area of Impairment: Orientation;Memory;Attention;Safety/judgement  Orientation Level: Disoriented to;Place;Situation Current Attention Level: Sustained Memory: Decreased short-term memory   Safety/Judgement: Decreased awareness of deficits     General Comments: pt aware she is in the hospital does not know the name or city, pt with difficulty recalling prior  function at times, unable to recall room number      General Comments      Exercises     Assessment/Plan    PT Assessment Patient needs continued PT services  PT Problem List Decreased strength;Decreased balance;Decreased cognition;Decreased activity tolerance;Decreased mobility;Decreased knowledge of use of DME;Decreased safety awareness       PT Treatment Interventions DME instruction;Functional mobility training;Balance training;Patient/family education;Gait training;Therapeutic activities;Stair training;Therapeutic exercise;Cognitive remediation    PT Goals (Current goals can be found in the Care Plan section)  Acute Rehab PT Goals Patient Stated Goal: return home PT Goal Formulation: With patient Time For Goal Achievement: 03/05/19 Potential to Achieve Goals: Good    Frequency Min 3X/week   Barriers to discharge Decreased caregiver support lives with grandson who works    Co-evaluation               AM-PAC PT "6 Clicks" Mobility  Outcome Measure Help needed turning from your back to your side while in a flat bed without using bedrails?: A Little Help needed moving from lying on your back to sitting on the side of a flat bed without using bedrails?: A Little Help needed moving to and from a bed to a chair (including a wheelchair)?: A Little Help needed standing up from a chair using your arms (e.g., wheelchair or bedside chair)?: A Little Help needed to walk in hospital room?: A Little Help needed climbing 3-5 steps with a railing? : A Little 6 Click Score: 18    End of Session Equipment Utilized During Treatment: Gait belt Activity Tolerance: Patient tolerated treatment well Patient left: in chair;with call bell/phone within reach;with chair alarm set Nurse Communication: Mobility status PT Visit Diagnosis: Other abnormalities of gait and mobility (R26.89);Muscle weakness (generalized) (M62.81);Difficulty in walking, not elsewhere classified (R26.2)     Time: 1201-1220 PT Time Calculation (min) (ACUTE ONLY): 19 min   Charges:   PT Evaluation $PT Eval Moderate Complexity: 1 Mod          [place], PT Acute Rehabilitation Services Pager: [phone] Office: [place] 02/19/2019, 1:01 PM

## 2019-02-19 NOTE — Discharge Instructions (Signed)

## 2019-02-19 NOTE — Progress Notes (Addendum)
PROGRESS NOTE  [name] MVE:[id number] DOB: 07-Feb-1928 DOA: 02/17/2019 PCP: No primary care provider on file.   LOS: 2 days   Brief narrative: Valerie [name]  is a 85 y.o. female, with past medical history significant for diastolic congestive heart failure, chronic, history of hypertension, hyperlipidemia, bradycardia, and hyperthyroidism on treatment presenting today with 10 days history of increasing shortness of breath that got worse for one day.  No chest pains, fever, chills. In the emergency room the patient was noted to be in A. fib and was in congestive heart failure.  Patient is not on anticoagulation and her last echo was done on 12/2018 showing  normal ejection fraction.  Patient was started on Lasix IV. COVID-19 test negative.  Subjective: Patient states that she feels fatigued tired and was unable to rest overnight.  Nursing staff reported tachycardia on minimal ambulation.  Denies any chest pain, nausea, vomiting, fever or chills.  Assessment/Plan:  Active Problems:   Congestive heart failure (CHF) (HCC)  Acute on chronic diastolic congestive heart failure.  Patient presented with dyspnea and pleural effusion.   last echocardiogram on 01/16/19 with normal ejection fraction.  Seen by cardiology.    Troponins 3 sets were negative.  Improving.  Patient has been changed to oral Lasix at this time.  New onset A. Fibrillation.  Better controlled.  Coreg dose has been increased to 6.25 twice daily.  Continue Eliquis that was initiated by cardiology.  Patient does have history of hypothyroidism but her TSH is within normal range.  Continue methimazole.    Hyperthyroidism Continue Tapazole.  TSH within normal limits  Hyperlipidemia Continue with Pravachol    CAD with old anterior MI  Continue with ASA and beta-blocker  VTE Prophylaxis: Lovenox  Code Status: DNR  Family Communication: Spoke with the patient's daughter on the phone and updated her about the clinical condition  of the patient.  We will continue to ambulate the patient today.  Disposition Plan:  Home likely tomorrow.  Monitor cardiac activity.  Ambulate the patient.  We will get physical therapy evaluation.  Consultants:  Cardiology  Procedures:  None  Antibiotics: Anti-infectives (From admission, onward)   None      Objective: Vitals:   02/19/19 0555 02/19/19 0814  BP: (!) 189/86 (!) 157/90  Pulse: 92 82  Resp:    Temp:    SpO2:      Intake/Output Summary (Last 24 hours) at 02/19/2019 1027 Last data filed at 02/19/2019 0927 Gross per 24 hour  Intake 583 ml  Output 1150 ml  Net -567 ml   Filed Weights   02/17/19 2005 02/18/19 0434 02/19/19 0440  Weight: 47.7 kg 46.3 kg 46.4 kg   Body mass index is 17.58 kg/m.   Physical Exam: GENERAL: Patient is alert awake and oriented. Not in obvious distress.  Thinly built. HENT: No scleral pallor or icterus. Pupils equally reactive to light. Oral mucosa is moist NECK: is supple, no palpable thyroid enlargement. CHEST:  Diminished breath sounds bilaterally.   CVS: S1 and S2 heard, no murmur.  Irregularly irregular rhythm with tachycardia, no pericardial rub. ABDOMEN: Soft, non-tender, bowel sounds are present. No palpable hepato-splenomegaly. EXTREMITIES: No edema CNS: Cranial nerves are intact. No focal motor or sensory deficits. SKIN: warm and dry without rashes.  Data Review: I have personally reviewed the following laboratory data and studies,  CBC: Recent Labs  Lab 02/17/19 1542 02/19/19 0341  WBC 8.1 7.4  NEUTROABS 5.6  --   HGB 11.7* 13.0  HCT 35.6* 38.8  MCV 94.9 92.8  PLT 350 433*   Basic Metabolic Panel: Recent Labs  Lab 02/17/19 1542 02/18/19 0210 02/19/19 0341  NA 141 141 140  K 3.6 3.1* 3.4*  CL 108 106 100  CO2 21* 22 25  GLUCOSE 98 80 90  BUN 11 9 20   CREATININE 0.81 0.85 0.91  CALCIUM 9.3 9.3 9.4  MG  --   --  1.6*  PHOS  --   --  4.0   Liver Function Tests: Recent Labs  Lab 02/17/19  1542  AST 18  ALT 16  ALKPHOS 83  BILITOT 0.6  PROT 5.9*  ALBUMIN 3.2*   No results for input(s): LIPASE, AMYLASE in the last 168 hours. No results for input(s): AMMONIA in the last 168 hours. Cardiac Enzymes: Recent Labs  Lab 02/17/19 1542 02/17/19 2033 02/18/19 0210 02/18/19 0746  TROPONINI <0.03 <0.03 <0.03 <0.03   BNP (last 3 results) Recent Labs    02/17/19 1542  BNP 914.9*    ProBNP (last 3 results) No results for input(s): PROBNP in the last 8760 hours.  CBG: No results for input(s): GLUCAP in the last 168 hours. Recent Results (from the past 240 hour(s))  SARS Coronavirus 2 (CEPHEID- Performed in [facility] hospital lab), Hosp Order     Status: None   Collection Time: 02/17/19  3:42 PM  Result Value Ref Range Status   SARS Coronavirus 2 NEGATIVE NEGATIVE Final    Comment: (NOTE) If result is NEGATIVE SARS-CoV-2 target nucleic acids are NOT DETECTED. The SARS-CoV-2 RNA is generally detectable in upper and lower  respiratory specimens during the acute phase of infection. The lowest  concentration of SARS-CoV-2 viral copies this assay can detect is 250  copies / mL. A negative result does not preclude SARS-CoV-2 infection  and should not be used as the sole basis for treatment or other  patient management decisions.  A negative result may occur with  improper specimen collection / handling, submission of specimen other  than nasopharyngeal swab, presence of viral mutation(s) within the  areas targeted by this assay, and inadequate number of viral copies  (<250 copies / mL). A negative result must be combined with clinical  observations, patient history, and epidemiological information. If result is POSITIVE SARS-CoV-2 target nucleic acids are DETECTED. The SARS-CoV-2 RNA is generally detectable in upper and lower  respiratory specimens dur ing the acute phase of infection.  Positive  results are indicative of active infection with SARS-CoV-2.  Clinical   correlation with patient history and other diagnostic information is  necessary to determine patient infection status.  Positive results do  not rule out bacterial infection or co-infection with other viruses. If result is PRESUMPTIVE POSTIVE SARS-CoV-2 nucleic acids MAY BE PRESENT.   A presumptive positive result was obtained on the submitted specimen  and confirmed on repeat testing.  While 2019 novel coronavirus  (SARS-CoV-2) nucleic acids may be present in the submitted sample  additional confirmatory testing may be necessary for epidemiological  and / or clinical management purposes  to differentiate between  SARS-CoV-2 and other Sarbecovirus currently known to infect humans.  If clinically indicated additional testing with an alternate test  methodology [phone]) is advised. The SARS-CoV-2 RNA is generally  detectable in upper and lower respiratory sp ecimens during the acute  phase of infection. The expected result is Negative. Fact Sheet for Patients:  StrictlyIdeas.no Fact Sheet for Healthcare Providers: BankingDealers.co.za This test is not yet approved or cleared  by the [place] and has been authorized for detection and/or diagnosis of SARS-CoV-2 by FDA under an Emergency Use Authorization (EUA).  This EUA will remain in effect (meaning this test can be used) for the duration of the COVID-19 declaration under Section 564(b)(1) of the Act, 21 U.S.C. section 360bbb-3(b)(1), unless the authorization is terminated or revoked sooner. Performed at [facility], [place] [address] [place], [place] [address]      Studies: Dg Chest 2 View  Result Date: 02/17/2019 CLINICAL DATA:  85 year old female with shortness of breath for 1 week EXAM: CHEST - 2 VIEW COMPARISON:  01/13/2019, 11/27/2018 FINDINGS: Cardiomediastinal silhouette unchanged. Interval development of bibasilar opacities with blunting of the costophrenic angles  and meniscus on the lateral view. Interlobular septal thickening. No pneumothorax. Coarsened interstitial markings persist. Osteopenia with degenerative changes of the spine. No displaced fracture. Surgical changes of bilateral shoulder arthroplasty IMPRESSION: Congestive heart failure with small bilateral pleural effusions. Electronically Signed   By: Corrie Mckusick D.O.   On: 02/17/2019 16:24    Scheduled Meds: . amLODipine  5 mg Oral Daily  . apixaban  2.5 mg Oral BID  . carvedilol  6.25 mg Oral BID WC  . furosemide  20 mg Oral Daily  . methimazole  5 mg Oral Daily  . pravastatin  10 mg Oral Daily  . sodium chloride flush  3 mL Intravenous Q12H  . [START ON 02/20/2019] Vitamin D (Ergocalciferol)  50,000 Units Oral Q7 days    Continuous Infusions: . sodium chloride       Flora Lipps, MD  [facility] 02/19/2019

## 2019-02-23 ENCOUNTER — Telehealth: Payer: Self-pay | Admitting: Nurse Practitioner

## 2019-02-23 NOTE — Telephone Encounter (Signed)
Scheduled patient for lab appointment on 5/29 per note from Ignacia Bayley, NP from patient's hospital d/c.      COVID-19 Pre-Screening Questions:   In the past 7 to 10 days have you had a cough,  shortness of breath, headache, congestion, fever (100 or greater) body aches, chills, sore throat, or sudden loss of taste or sense of smell? No  Have you been around anyone with known Covid 19.No  Have you been around anyone who is awaiting Covid 19 test results in the past 7 to 10 days? No  Have you been around anyone who has been exposed to Covid 19, or has mentioned symptoms of Covid 19 within the past 7 to 10 days? No  If you have any concerns/questions about symptoms patients report during screening (either on the phone or at [facility]). Contact the provider seeing the patient or DOD for further guidance.  If neither are available contact a member of the leadership team.       Scheduled virtual visit with Dr. Acie Fredrickson for 6/8 Will need to call patient first to get number of family member that will be with her for the visit. YOUR CARDIOLOGY TEAM HAS ARRANGED FOR AN E-VISIT FOR YOUR APPOINTMENT - PLEASE REVIEW IMPORTANT INFORMATION BELOW SEVERAL DAYS PRIOR TO YOUR APPOINTMENT  Due to the recent COVID-19 pandemic, we are transitioning in-person office visits to tele-medicine visits in an effort to decrease unnecessary exposure to our patients, their families, and staff. These visits are billed to your insurance just like a normal visit is. We also encourage you to sign up for MyChart if you have not already done so. You will need a smartphone if possible. For patients that do not have this, we can still complete the visit using a regular telephone but do prefer a smartphone to enable video when possible. You may have a family member that lives with you that can help. If possible, we also ask that you have a blood pressure cuff and scale at home to measure your blood pressure, heart rate and weight  prior to your scheduled appointment. Patients with clinical needs that need an in-person evaluation and testing will still be able to come to the office if absolutely necessary. If you have any questions, feel free to call our office.     YOUR PROVIDER WILL BE USING THE FOLLOWING PLATFORM TO COMPLETE YOUR VISIT: Doxy.Me    IF USING DOXIMITY or [email or web address].ME - The staff will give you instructions on receiving your link to join the meeting the day of your visit.     2-3 DAYS BEFORE YOUR APPOINTMENT  You will receive a telephone call from one of our [place] team members - your caller ID may say "Unknown caller." If this is a video visit, we will walk you through how to get the video launched on your phone. We will remind you check your blood pressure, heart rate and weight prior to your scheduled appointment. If you have an Apple Watch or Kardia, please upload any pertinent ECG strips the day before or morning of your appointment to [place]. Our staff will also make sure you have reviewed the consent and agree to move forward with your scheduled tele-health visit.     THE DAY OF YOUR APPOINTMENT  Approximately 15 minutes prior to your scheduled appointment, you will receive a telephone call from one of [facility] team - your caller ID may say "Unknown caller."  Our staff will confirm medications, vital signs for the day and  any symptoms you may be experiencing. Please have this information available prior to the time of visit start. It may also be helpful for you to have a pad of paper and pen handy for any instructions given during your visit. They will also walk you through joining the smartphone meeting if this is a video visit.    CONSENT FOR TELE-HEALTH VISIT - PLEASE REVIEW  I hereby voluntarily request, consent and authorize [facility] and its employed or contracted physicians, physician assistants, nurse practitioners or other licensed health care professionals (the Practitioner), to  provide me with telemedicine health care services (the Services") as deemed necessary by the treating Practitioner. I acknowledge and consent to receive the Services by the Practitioner via telemedicine. I understand that the telemedicine visit will involve communicating with the Practitioner through live audiovisual communication technology and the disclosure of certain medical information by electronic transmission. I acknowledge that I have been given the opportunity to request an in-person assessment or other available alternative prior to the telemedicine visit and am voluntarily participating in the telemedicine visit.  I understand that I have the right to withhold or withdraw my consent to the use of telemedicine in the course of my care at any time, without affecting my right to future care or treatment, and that the Practitioner or I may terminate the telemedicine visit at any time. I understand that I have the right to inspect all information obtained and/or recorded in the course of the telemedicine visit and may receive copies of available information for a reasonable fee.  I understand that some of the potential risks of receiving the Services via telemedicine include:   Delay or interruption in medical evaluation due to technological equipment failure or disruption;  Information transmitted may not be sufficient (e.g. poor resolution of images) to allow for appropriate medical decision making by the Practitioner; and/or   In rare instances, security protocols could fail, causing a breach of personal health information.  Furthermore, I acknowledge that it is my responsibility to provide information about my medical history, conditions and care that is complete and accurate to the best of my ability. I acknowledge that Practitioner's advice, recommendations, and/or decision may be based on factors not within their control, such as incomplete or inaccurate data provided by me or distortions of  diagnostic images or specimens that may result from electronic transmissions. I understand that the practice of medicine is not an exact science and that Practitioner makes no warranties or guarantees regarding treatment outcomes. I acknowledge that I will receive a copy of this consent concurrently upon execution via email to the email address I last provided but may also request a printed copy by calling the office of [facility].    I understand that my insurance will be billed for this visit.   I have read or had this consent read to me.  I understand the contents of this consent, which adequately explains the benefits and risks of the Services being provided via telemedicine.   I have been provided ample opportunity to ask questions regarding this consent and the Services and have had my questions answered to my satisfaction.  I give my informed consent for the services to be provided through the use of telemedicine in my medical care  By participating in this telemedicine visit I agree to the above.          Marland Kitchen

## 2019-02-24 ENCOUNTER — Other Ambulatory Visit: Payer: Medicare Other | Admitting: *Deleted

## 2019-02-24 ENCOUNTER — Telehealth: Payer: Self-pay

## 2019-02-24 ENCOUNTER — Other Ambulatory Visit: Payer: Self-pay

## 2019-02-24 DIAGNOSIS — I4819 Other persistent atrial fibrillation: Secondary | ICD-10-CM

## 2019-02-24 LAB — BASIC METABOLIC PANEL
BUN/Creatinine Ratio: 16 (ref 12–28)
BUN: 18 mg/dL (ref 8–27)
CO2: 26 mmol/L (ref 20–29)
Calcium: 9.5 mg/dL (ref 8.7–10.3)
Chloride: 98 mmol/L (ref 96–106)
Creatinine, Ser: 1.14 mg/dL — ABNORMAL HIGH (ref 0.57–1.00)
GFR calc Af Amer: 49 mL/min/{1.73_m2} — ABNORMAL LOW (ref >59)
GFR calc non Af Amer: 43 mL/min/{1.73_m2} — ABNORMAL LOW (ref >59)
Glucose: 105 mg/dL — ABNORMAL HIGH (ref 65–99)
Potassium: 4.5 mmol/L (ref 3.5–5.2)
Sodium: 138 mmol/L (ref 134–144)

## 2019-02-24 MED ORDER — FUROSEMIDE 20 MG PO TABS: 10.0000 mg | ORAL_TABLET | Freq: Every day | ORAL | 3 refills | Status: DC

## 2019-02-24 NOTE — Telephone Encounter (Signed)
Called patient and her daughter with lab results. Per Ignacia Bayley NP, Creat mildly elevated.  Reduce lasix to 1/2 tab daily and d/c potassium. Patient and her daughter verbalized understanding and will make medications changes. Will update patient's medication list.

## 2019-02-24 NOTE — Telephone Encounter (Signed)
-----   Message from Theora Gianotti, NP sent at 02/24/2019  5:20 PM EDT ----- Creat mildly elevated.  Reduce lasix to 1/2 tab daily and d/c potassium.

## 2019-02-27 ENCOUNTER — Other Ambulatory Visit: Payer: Self-pay | Admitting: Cardiovascular Disease

## 2019-03-05 NOTE — Progress Notes (Signed)
Virtual Visit via Video Note   This visit type was conducted due to national recommendations for restrictions regarding the COVID-19 Pandemic (e.g. social distancing) in an effort to limit this patient's exposure and mitigate transmission in our community.  Due to her co-morbid illnesses, this patient is at least at moderate risk for complications without adequate follow up.  This format is felt to be most appropriate for this patient at this time.  All issues noted in this document were discussed and addressed.  A limited physical exam was performed with this format.  Please refer to the patient's chart for her consent to telehealth for [facility].   Date:  03/06/2019   ID:  Valerie [name], DOB [birth year], MRN [id number]  Patient Location: Home Provider Location: Home  PCP:  Chevis Pretty, FNP  Cardiologist:  Mertie Moores, MD  Electrophysiologist:  None   Evaluation Performed:  Follow-Up Visit  Please note pt was followed by Dr. Mare Ferrari and with his retirement was seen once by Dr. Domenic Polite in [place], but her daughter who is followed by Dr. Acie Fredrickson would like her seen by Dr. Acie Fredrickson.  He has agreed to see pt.  Previous notes from [place]:  AERIANNA [name] is a 85 y.o. female who presents for HTN, bradycardia and dizziness.  PMH of HTN, HLD, anxiety, depression, GERD, CAD, glaucoma-underwent intervention approximately 3 months ago, lives alone but has family living next door who provide close supervision, presented to [facility] ED on 06/30/16 with complaints of ongoing blurred vision for several weeks post procedure,? Intermittent dyspnea and arm pain. Along with chest pain that she states is arthritic. She checks her blood pressures regularly at home and apparently were elevated in the 220/95 range despite compliance with medications. She apparently has not felt well for a couple of days, headache, poor oral intake and visual changes since surgery for which she has recently been seen by her  ophthalmologist and has follow-up in 2 months. In the ED, CT head without acute abnormalities, initial blood pressure 220/77. Admitted for hypertensive urgency. Her troponin was negative. Hx of neg nuc study in 2015.   She had HR to 46 (though looking back she has been slow before).. Meds adjusted and plan for outpt echo. She had complained about SOB but was not clear today she does have at times. Was with hypokalemia and follow up was with K+ 3.3.   Hx of carotid disease with last check in 09/2015 with 1-39% bil stenosis. To be seen by vascular in Dec.   On her last visit she complained of dizziness.  Her HR was low so I stopped cardizem and added amlodipine.  Also on 48 hour holter she was in SR 56 to 60 with occ episodes of sinus brady brief to 42 and occ PACs and PVCs.  Her echo with EF of 55 to 60% G2DD, mild Aortic regurg. LA mildly dialted. PA pk pressure 37 mmHg.   Today she feels better with less palpitations and less dizziness.  She does complain of hair loss.  This began with amlodipine.  She had hair loss with lisinopril as well.  No chest pain and no SOB.    November 26, 2016:  [name] is seen for the first time today.  Seen with Valerie [name] ( sister)  She is a transfer from Dr. Mare Ferrari. She has a history of hypertension and bradycardia.  BP has been up and down.  She takes it at home  BP has been as high  as 185 and as low as 125 Goes out to eat once a week .   Does not pay attention to the salt in her food.   Tries to avoid fried foods.    Has rare episodes of palpitations . Perhaps twice a month. Worse when she is lying down  Had a 48 hr monitor  - revealed PACS and PVCs   August 30, 2017:  Seen with Son,  Valerie [name]  Hx of HTN , blood pressure is high today.  She makes no effort to avoid salt. No cardiac issues [place] , broke some ribs  Was in the hospital recently for HTN , ( felt some eye pressure )  Still eating some canned foods.   March 09, 2018:   Valerie [name] is seen today for follow-up of her hypertension. Has had some swimmyheadedness - has a cold. Has felt poorly for the past for 3 weeks.  Has had a URI for those 3 weeks No syncope   Dec. 11, 2019:   Has a cold today No syncope No dizziness HR is much better off the metoprolol   March 06, 2019     Chief Complaint:  Follow up recent hospitalization for a-fib and CHF   Valerie [name] is a 85 y.o. female with hx of HTN, aortic insufficiency .  She was recently hospitalized and was found to have atrial fibrillation.    She is breathing better.   Has had a cough which has improved.  VS are well controlled this am  Had an episode of palpitations last night for about 1 minute.  Was also found to have hyperthyroidism   The patient does not have symptoms concerning for COVID-19 infection (fever, chills, cough, or new shortness of breath).    Past Medical History:  Diagnosis Date  . Anxiety   . Arthritis   . Broken ribs   . Carotid artery disease ([place])   . Cataract   . Collar bone fracture    Right  . Depression   . Diverticulosis of colon with hemorrhage   . Duodenitis   . Esophageal reflux   . Esophageal stricture   . Essential hypertension   . Glaucoma   . Hiatal hernia   . History of kidney stones     x1   . Hyperlipidemia   . IBS (irritable bowel syndrome)   . Palpitations   . UTI (urinary tract infection) March 2015   Past Surgical History:  Procedure Laterality Date  . CHOLECYSTECTOMY    . COLONOSCOPY    . DILATION AND CURETTAGE OF UTERUS    . EYE SURGERY Bilateral    total of 5  . LASER PHOTO ABLATION Right 10/20/2016   Procedure: LASER PHOTO ABLATION;  Surgeon: Hayden Pedro, MD;  Location: [place];  Service: Ophthalmology;  Laterality: Right;  Headscope laser  . LEFT ROTATOR CUFF REPAIR X2 Left   . PARS PLANA VITRECTOMY  10/20/2016   WITH 25G REMOVAL/SUTURE SECONDARY INTRAOCULAR LENS, GAS FLUID EXCHANGE   . PARS PLANA VITRECTOMY Right 10/20/2016    Procedure: PARS PLANA VITRECTOMY WITH 25G REMOVAL/SUTURE SECONDARY INTRAOCULAR LENS, GAS FLUID EXCHANGE;  Surgeon: Hayden Pedro, MD;  Location: [facility];  Service: Ophthalmology;  Laterality: Right;  . RIGHT ROTATOR CUFF REPAIR X1 Right   . UPPER GASTROINTESTINAL ENDOSCOPY     with dilation     Current Meds  Medication Sig  . acetaminophen (TYLENOL) 325 MG tablet Take 650 mg by mouth every 6 (six) hours  as needed for headache (pain).  Marland Kitchen amLODipine (NORVASC) 5 MG tablet Take 1 tablet (5 mg total) by mouth daily.  Marland Kitchen apixaban (ELIQUIS) 2.5 MG TABS tablet Take 1 tablet (2.5 mg total) by mouth 2 (two) times daily.  . carvedilol (COREG) 6.25 MG tablet Take 1 tablet (6.25 mg total) by mouth 2 (two) times daily with a meal.  . fluticasone (FLONASE) 50 MCG/ACT nasal spray Place 2 sprays into both nostrils as needed for allergies or rhinitis.  . methimazole (TAPAZOLE) 5 MG tablet Take 1 tablet (5 mg total) by mouth daily.  . nitroGLYCERIN (NITROSTAT) 0.4 MG SL tablet Place 1 tablet (0.4 mg total) under the tongue every 5 (five) minutes as needed for chest pain.  . pravastatin (PRAVACHOL) 10 MG tablet TAKE 1 TABLET ONCE A DAY  . Vitamin D, Ergocalciferol, (DRISDOL) 50000 units CAPS capsule Take 1 capsule (50,000 Units total) by mouth every 7 (seven) days.  . [DISCONTINUED] aspirin EC 81 MG tablet Take 81 mg by mouth daily.  . [DISCONTINUED] nitroGLYCERIN (NITROSTAT) 0.4 MG SL tablet Place 1 tablet (0.4 mg total) under the tongue every 5 (five) minutes as needed for chest pain.     Allergies:   Aricept [donepezil hcl]; Lisinopril; Sulfa antibiotics; and Sulfasalazine   Social History   Tobacco Use  . Smoking status: Never Smoker  . Smokeless tobacco: Never Used  Substance Use Topics  . Alcohol use: No  . Drug use: No     Family Hx: The patient's family history includes Bladder Cancer in her brother; Colon cancer (age of onset: 22) in her father; Kidney cancer in her brother; Leukemia in her  mother; Stroke in her sister. There is no history of Esophageal cancer or Stomach cancer.  ROS:   Please see the history of present illness.     All other systems reviewed and are negative.   Prior CV studies:   The following studies were reviewed today:    Labs/Other Tests and Data Reviewed:    EKG:  No ECG reviewed.  Recent Labs: 02/17/2019: ALT 16; B Natriuretic Peptide 914.9; TSH 0.537 02/19/2019: Hemoglobin 13.0; Magnesium 1.6; Platelets 402 02/24/2019: BUN 18; Creatinine, Ser 1.14; Potassium 4.5; Sodium 138   Recent Lipid Panel Lab Results  Component Value Date/Time   CHOL 186 03/09/2018 10:22 AM   TRIG 156 (H) 03/09/2018 10:22 AM   HDL 51 03/09/2018 10:22 AM   CHOLHDL 3.6 03/09/2018 10:22 AM   CHOLHDL 4 12/05/2014 09:46 AM   LDLCALC 104 (H) 03/09/2018 10:22 AM    Wt Readings from Last 3 Encounters:  02/19/19 102 lb 6.4 oz (46.4 kg)  01/23/19 110 lb (49.9 kg)  01/15/19 108 lb 8 oz (49.2 kg)     Objective:    Vital Signs:  BP 124/69 (BP Location: Left Arm, Patient Position: Sitting, Cuff Size: Normal)   Pulse 67   Ht 5\' 4"  (1.626 m)   BMI 17.58 kg/m    VITAL SIGNS:  reviewed GEN:  no acute distress EYES:  sclerae anicteric, EOMI - Extraocular Movements Intact RESPIRATORY:  normal respiratory effort, symmetric expansion CARDIOVASCULAR:  no peripheral edema SKIN:  no rash, lesions or ulcers. MUSCULOSKELETAL:  no obvious deformities. NEURO:  alert and oriented x 3, no obvious focal deficit PSYCH:  normal affect  ASSESSMENT & PLAN:    1. Atrial fib:   CHADS2VASC is 37 ( female, age, HTN, CHF) Seems to be doing well.  Cont eliquis.   Will DC asa  2.  HTN:   BP is well controlled.   3.  Chronic diastolic CHF:   stable  [id number] Education: The signs and symptoms of COVID-19 were discussed with the patient and how to seek care for testing (follow up with PCP or arrange E-visit).  The importance of social distancing was discussed today.  Time:   Today, I  have spent  21  minutes with the patient with telehealth technology discussing the above problems.     Medication Adjustments/Labs and Tests Ordered: Current medicines are reviewed at length with the patient today.  Concerns regarding medicines are outlined above.   Tests Ordered: Orders Placed This Encounter  Procedures  . Basic Metabolic Panel (BMET)  . CBC    Medication Changes: Meds ordered this encounter  Medications  . nitroGLYCERIN (NITROSTAT) 0.4 MG SL tablet    Sig: Place 1 tablet (0.4 mg total) under the tongue every 5 (five) minutes as needed for chest pain.    Dispense:  25 tablet    Refill:  3    Disposition:  Follow up in 6 month(s)  Signed, Mertie Moores, MD  03/06/2019 10:33 AM    [facility]

## 2019-03-06 ENCOUNTER — Other Ambulatory Visit: Payer: Self-pay

## 2019-03-06 ENCOUNTER — Telehealth (INDEPENDENT_AMBULATORY_CARE_PROVIDER_SITE_OTHER): Payer: Medicare Other | Admitting: Cardiovascular Disease

## 2019-03-06 ENCOUNTER — Encounter: Payer: Self-pay | Admitting: Cardiovascular Disease

## 2019-03-06 VITALS — BP 124/69 | HR 67 | Ht 64.0 in

## 2019-03-06 DIAGNOSIS — I480 Paroxysmal atrial fibrillation: Secondary | ICD-10-CM | POA: Diagnosis not present

## 2019-03-06 DIAGNOSIS — I4891 Unspecified atrial fibrillation: Secondary | ICD-10-CM | POA: Insufficient documentation

## 2019-03-06 DIAGNOSIS — Z7189 Other specified counseling: Secondary | ICD-10-CM

## 2019-03-06 DIAGNOSIS — Z7901 Long term (current) use of anticoagulants: Secondary | ICD-10-CM

## 2019-03-06 DIAGNOSIS — I6523 Occlusion and stenosis of bilateral carotid arteries: Secondary | ICD-10-CM

## 2019-03-06 DIAGNOSIS — E782 Mixed hyperlipidemia: Secondary | ICD-10-CM

## 2019-03-06 MED ORDER — NITROGLYCERIN 0.4 MG SL SUBL: 0.4000 mg | SUBLINGUAL_TABLET | SUBLINGUAL | 3 refills | Status: DC | PRN

## 2019-03-06 NOTE — Patient Instructions (Signed)
Medication Instructions:  Your physician has recommended you make the following change in your medication:  STOP Aspirin  If you need a refill on your cardiac medications before your next appointment, please call your pharmacy.    Lab work: Your physician recommends that you return for lab work on Wednesday June 17. You may come in to our office for your lab appointment anytime after 7:30 am. You do not have to fast for this appointment.   Testing/Procedures: None Ordered   Follow-Up: At [facility], you and your health needs are our priority.  As part of our continuing mission to provide you with exceptional heart care, we have created designated Provider Care Teams.  These Care Teams include your primary Cardiologist (physician) and Advanced Practice Providers (APPs -  Physician Assistants and Nurse Practitioners) who all work together to provide you with the care you need, when you need it. You will need a follow up appointment in:  6 months.  Please call our office 2 months in advance to schedule this appointment.  You may see Mertie Moores, MD or one of the following Advanced Practice Providers on your designated Care Team: Richardson Dopp, PA-C [place], [place] . Daune Perch, NP

## 2019-03-15 ENCOUNTER — Other Ambulatory Visit: Payer: Self-pay

## 2019-03-15 ENCOUNTER — Other Ambulatory Visit: Payer: Medicare Other | Admitting: *Deleted

## 2019-03-15 DIAGNOSIS — I6523 Occlusion and stenosis of bilateral carotid arteries: Secondary | ICD-10-CM

## 2019-03-15 DIAGNOSIS — Z7901 Long term (current) use of anticoagulants: Secondary | ICD-10-CM

## 2019-03-15 DIAGNOSIS — I480 Paroxysmal atrial fibrillation: Secondary | ICD-10-CM

## 2019-03-16 LAB — BASIC METABOLIC PANEL
BUN/Creatinine Ratio: 12 (ref 12–28)
BUN: 14 mg/dL (ref 8–27)
CO2: 25 mmol/L (ref 20–29)
Calcium: 10.5 mg/dL — ABNORMAL HIGH (ref 8.7–10.3)
Chloride: 102 mmol/L (ref 96–106)
Creatinine, Ser: 1.14 mg/dL — ABNORMAL HIGH (ref 0.57–1.00)
GFR calc Af Amer: 49 mL/min/{1.73_m2} — ABNORMAL LOW (ref >59)
GFR calc non Af Amer: 43 mL/min/{1.73_m2} — ABNORMAL LOW (ref >59)
Glucose: 80 mg/dL (ref 65–99)
Potassium: 4.9 mmol/L (ref 3.5–5.2)
Sodium: 142 mmol/L (ref 134–144)

## 2019-03-16 LAB — CBC
Hematocrit: 41.7 % (ref 34.0–46.6)
Hemoglobin: 14 g/dL (ref 11.1–15.9)
MCH: 32.4 pg (ref 26.6–33.0)
MCHC: 33.6 g/dL (ref 31.5–35.7)
MCV: 97 fL (ref 79–97)
Platelets: 343 10*3/uL (ref 150–450)
RBC: 4.32 x10E6/uL (ref 3.77–5.28)
RDW: 13 % (ref 11.7–15.4)
WBC: 6.7 10*3/uL (ref 3.4–10.8)

## 2019-03-20 ENCOUNTER — Other Ambulatory Visit: Payer: Self-pay | Admitting: *Deleted

## 2019-03-20 ENCOUNTER — Telehealth: Payer: Self-pay | Admitting: Cardiovascular Disease

## 2019-03-20 DIAGNOSIS — I119 Hypertensive heart disease without heart failure: Secondary | ICD-10-CM

## 2019-03-20 DIAGNOSIS — I509 Heart failure, unspecified: Secondary | ICD-10-CM

## 2019-03-20 NOTE — Telephone Encounter (Signed)
New message:     Patient daughter calling stating some one called her concering some results. Please call patient daughter back.

## 2019-03-20 NOTE — Telephone Encounter (Signed)
See lab results instructions ./cy

## 2019-03-22 ENCOUNTER — Other Ambulatory Visit: Payer: Self-pay

## 2019-03-22 ENCOUNTER — Ambulatory Visit (INDEPENDENT_AMBULATORY_CARE_PROVIDER_SITE_OTHER): Payer: Medicare Other | Admitting: Internal Medicine

## 2019-03-22 DIAGNOSIS — E0590 Thyrotoxicosis, unspecified without thyrotoxic crisis or storm: Secondary | ICD-10-CM

## 2019-03-22 DIAGNOSIS — E0500 Thyrotoxicosis with diffuse goiter without thyrotoxic crisis or storm: Secondary | ICD-10-CM | POA: Diagnosis not present

## 2019-03-22 NOTE — Progress Notes (Signed)
Virtual Visit via Video Note  I connected with [name] on 03/22/19 at 9:10 AM  by a video enabled telemedicine application and verified that I am speaking with the correct person using two identifiers.   I discussed the limitations of evaluation and management by telemedicine and the availability of in person appointments. The patient expressed understanding and agreed to proceed.  -Location of the patient :Home -Location of the provider : Office -The names of all persons participating in the telemedicine service : Pt and myself, and daughter [name]        Name: Valerie [name]  MRN/ DOB: [id number], 1928-01-13    Age/ Sex: 85 y.o., female     PCP: Chevis Pretty, FNP   Reason for Endocrinology Evaluation: hyperthyroidism     Initial Endocrinology Clinic Visit: 01/24/2019    PATIENT IDENTIFIER: [name] is a 85 y.o., female with a past medical history of HTN, Hyperlipidemia, CHF/CAD and carotid artery stenosis . She has followed with [facility] since 01/24/2019 for consultative assistance with management of her hyperthyroidism.   HISTORICAL SUMMARY: The patient was first diagnosed with hyperthyroidism in 12/2018 with a TSH of 0.179 uIU/mL with an elevated FT4 and T3 during evaluation for near syncope in the ED. She was started on Atenolol and Methimazole at the time.    She follows at  [facility] and would like to have labs there.  She did present to ED with SoB in 01/2019 and was diagnosed with new onset A.Fib    SUBJECTIVE:   During last visit (01/24/2019): We continued methimazole at 5 mg daily   Today (03/22/2019):  [name] is here for a follow up on hyperthyroidism secondary to graves' disease. She has been compliant with methimazole dose. She was recently admitted in 01/2019 with CHF and new onset A.Fib.   Her energy has improved, she denies any local neck symptoms.  She denies any diarrhea, tremors , weight loss or  GI symptoms.   She has chronic anxiety and that has been stable.      ROS:  As per HPI.   HISTORY:  Past Medical History:  Past Medical History:  Diagnosis Date  . Anxiety   . Arthritis   . Broken ribs   . Carotid artery disease ([place])   . Cataract   . Collar bone fracture    Right  . Depression   . Diverticulosis of colon with hemorrhage   . Duodenitis   . Esophageal reflux   . Esophageal stricture   . Essential hypertension   . Glaucoma   . Hiatal hernia   . History of kidney stones     x1   . Hyperlipidemia   . IBS (irritable bowel syndrome)   . Palpitations   . UTI (urinary tract infection) March 2015    Past Surgical History:  Past Surgical History:  Procedure Laterality Date  . CHOLECYSTECTOMY    . COLONOSCOPY    . DILATION AND CURETTAGE OF UTERUS    . EYE SURGERY Bilateral    total of 5  . LASER PHOTO ABLATION Right 10/20/2016   Procedure: LASER PHOTO ABLATION;  Surgeon: Hayden Pedro, MD;  Location: [facility];  Service: Ophthalmology;  Laterality: Right;  Headscope laser  . LEFT ROTATOR CUFF REPAIR X2 Left   . PARS PLANA VITRECTOMY  10/20/2016   WITH 25G REMOVAL/SUTURE SECONDARY INTRAOCULAR LENS, GAS FLUID EXCHANGE   . PARS PLANA VITRECTOMY Right 10/20/2016  Procedure: PARS PLANA VITRECTOMY WITH 25G REMOVAL/SUTURE SECONDARY INTRAOCULAR LENS, GAS FLUID EXCHANGE;  Surgeon: Hayden Pedro, MD;  Location: [place];  Service: Ophthalmology;  Laterality: Right;  . RIGHT ROTATOR CUFF REPAIR X1 Right   . UPPER GASTROINTESTINAL ENDOSCOPY     with dilation     Social History:  reports that she has never smoked. She has never used smokeless tobacco. She reports that she does not drink alcohol or use drugs. Family History:  Family History  Problem Relation Age of Onset  . Leukemia Mother   . Colon cancer Father 69  . Kidney cancer Brother   . Bladder Cancer Brother   . Stroke Sister   . Esophageal cancer Neg Hx   . Stomach cancer Neg Hx       HOME  MEDICATIONS: Allergies as of 03/22/2019      Reactions   Aricept [donepezil Hcl] Other (See Comments)   Nightmares, near syncope, weak, decreased appetite.   Lisinopril Other (See Comments)   Hair loss   Sulfa Antibiotics Nausea Only   Sulfasalazine Nausea Only      Medication List       Accurate as of March 22, 2019  8:06 AM. If you have any questions, ask your nurse or doctor.        acetaminophen 325 MG tablet Commonly known as: TYLENOL Take 650 mg by mouth every 6 (six) hours as needed for headache (pain).   amLODipine 5 MG tablet Commonly known as: NORVASC Take 1 tablet (5 mg total) by mouth daily.   apixaban 2.5 MG Tabs tablet Commonly known as: ELIQUIS Take 1 tablet (2.5 mg total) by mouth 2 (two) times daily.   carvedilol 6.25 MG tablet Commonly known as: COREG Take 1 tablet (6.25 mg total) by mouth 2 (two) times daily with a meal.   fluticasone 50 MCG/ACT nasal spray Commonly known as: FLONASE Place 2 sprays into both nostrils as needed for allergies or rhinitis.   methimazole 5 MG tablet Commonly known as: TAPAZOLE Take 1 tablet (5 mg total) by mouth daily.   nitroGLYCERIN 0.4 MG SL tablet Commonly known as: NITROSTAT Place 1 tablet (0.4 mg total) under the tongue every 5 (five) minutes as needed for chest pain.   pravastatin 10 MG tablet Commonly known as: PRAVACHOL TAKE 1 TABLET ONCE A DAY   Vitamin D (Ergocalciferol) 1.25 MG (50000 UT) Caps capsule Commonly known as: DRISDOL Take 1 capsule (50,000 Units total) by mouth every 7 (seven) days.         DATA REVIEWED: Results for CECIL, BIXBY (MRN [id number]) as of 03/22/2019 09:11  Ref. Range 02/06/2019 15:57 02/17/2019 20:33  TSH Latest Ref Range: 0.350 - 4.500 uIU/mL 0.345 (L) 0.537  T4,Free(Direct) Latest Ref Range: 0.82 - 1.77 ng/dL 1.44     Results for RYANA, MONTECALVO (MRN [id number]) as of 03/22/2019 08:11  Ref. Range 02/06/2019 15:57  Thyrotropin Receptor Ab Latest Ref Range: 0.00 - 1.75 IU/L  3.12 (H)    ASSESSMENT / PLAN / RECOMMENDATIONS:   1. Hyperthyroidism Secondary to Graves' Disease:  - Pt is clinically and biochemically euthyroid - No local neck symptoms.  - No side effects to methimazole - Will recheck labs at PCP's office the first week of July   Medications  Methimazole 5 mg daily   I discussed the assessment and treatment plan with the patient. The patient was provided an opportunity to ask questions and all were answered. The patient agreed with the plan  and demonstrated an understanding of the instructions.   The patient was advised to call back or seek an in-person evaluation if the symptoms worsen or if the condition fails to improve as anticipated.    F/u in 2 months    Signed electronically by: Mack Guise, MD  [facility] Endocrinology  [facility] [address] [place], [place] [address] Phone: [phone] FAX: [phone]      CC: Chevis Pretty, [place] [address] Phone: [phone]  Fax: [phone]   Return to Endocrinology clinic as below: Future Appointments  Date Time Provider [place]  03/22/2019  9:10 AM Shamleffer, Melanie Crazier, MD [facility] None

## 2019-03-27 ENCOUNTER — Other Ambulatory Visit: Payer: Self-pay

## 2019-03-27 ENCOUNTER — Other Ambulatory Visit: Payer: Medicare Other

## 2019-03-27 DIAGNOSIS — I119 Hypertensive heart disease without heart failure: Secondary | ICD-10-CM

## 2019-03-27 DIAGNOSIS — I509 Heart failure, unspecified: Secondary | ICD-10-CM

## 2019-03-27 LAB — BASIC METABOLIC PANEL
BUN/Creatinine Ratio: 21 (ref 12–28)
BUN: 20 mg/dL (ref 8–27)
CO2: 22 mmol/L (ref 20–29)
Calcium: 10 mg/dL (ref 8.7–10.3)
Chloride: 100 mmol/L (ref 96–106)
Creatinine, Ser: 0.96 mg/dL (ref 0.57–1.00)
GFR calc Af Amer: 61 mL/min/{1.73_m2} (ref >59)
GFR calc non Af Amer: 53 mL/min/{1.73_m2} — ABNORMAL LOW (ref >59)
Glucose: 94 mg/dL (ref 65–99)
Potassium: 4.6 mmol/L (ref 3.5–5.2)
Sodium: 138 mmol/L (ref 134–144)

## 2019-03-27 LAB — PRO B NATRIURETIC PEPTIDE: NT-Pro BNP: 335 pg/mL (ref 0–738)

## 2019-03-30 ENCOUNTER — Other Ambulatory Visit: Payer: Self-pay

## 2019-03-30 ENCOUNTER — Other Ambulatory Visit: Payer: Medicare Other

## 2019-03-30 DIAGNOSIS — E0590 Thyrotoxicosis, unspecified without thyrotoxic crisis or storm: Secondary | ICD-10-CM

## 2019-03-31 LAB — T4, FREE: Free T4: 0.87 ng/dL (ref 0.82–1.77)

## 2019-03-31 LAB — TSH: TSH: 3.34 u[IU]/mL (ref 0.450–4.500)

## 2019-04-03 ENCOUNTER — Other Ambulatory Visit: Payer: Self-pay | Admitting: Internal Medicine

## 2019-04-03 ENCOUNTER — Telehealth: Payer: Self-pay | Admitting: Internal Medicine

## 2019-04-03 DIAGNOSIS — E0590 Thyrotoxicosis, unspecified without thyrotoxic crisis or storm: Secondary | ICD-10-CM

## 2019-04-03 MED ORDER — METHIMAZOLE 5 MG PO TABS: 2.5000 mg | ORAL_TABLET | Freq: Every day | ORAL | 2 refills | Status: DC

## 2019-04-03 NOTE — Telephone Encounter (Signed)
Pt daughter [name] aware of results and stated that she would take her mother for repeat labs in 6 weeks

## 2019-04-03 NOTE — Telephone Encounter (Signed)
Called pt daughter [name] and lft vm to return call

## 2019-04-03 NOTE — Telephone Encounter (Signed)
Patients daughter [name] [phone] is returning call for patients labs

## 2019-04-03 NOTE — Telephone Encounter (Signed)
Not sure of daughters name or number so called pt number back and lft vm to return call

## 2019-04-03 NOTE — Telephone Encounter (Signed)
Patients daughter returning call to get lab results

## 2019-05-22 ENCOUNTER — Other Ambulatory Visit: Payer: Self-pay

## 2019-05-24 ENCOUNTER — Other Ambulatory Visit: Payer: Self-pay

## 2019-05-24 ENCOUNTER — Ambulatory Visit (INDEPENDENT_AMBULATORY_CARE_PROVIDER_SITE_OTHER): Payer: Medicare Other | Admitting: Internal Medicine

## 2019-05-24 ENCOUNTER — Encounter: Payer: Self-pay | Admitting: Internal Medicine

## 2019-05-24 VITALS — BP 132/68 | HR 61 | Temp 97.9°F | Ht 64.0 in | Wt 101.0 lb

## 2019-05-24 DIAGNOSIS — E0590 Thyrotoxicosis, unspecified without thyrotoxic crisis or storm: Secondary | ICD-10-CM

## 2019-05-24 DIAGNOSIS — E0500 Thyrotoxicosis with diffuse goiter without thyrotoxic crisis or storm: Secondary | ICD-10-CM

## 2019-05-24 LAB — T4, FREE: Free T4: 0.85 ng/dL (ref 0.60–1.60)

## 2019-05-24 LAB — TSH: TSH: 2.81 u[IU]/mL (ref 0.35–4.50)

## 2019-05-24 NOTE — Progress Notes (Signed)
Name: Valerie [name]  MRN/ DOB: [id number], [birth year]    Age/ Sex: 85 y.o., female     PCP: Chevis Pretty, FNP   Reason for Endocrinology Evaluation: hyperthyroidism     Initial Endocrinology Clinic Visit: 01/24/2019    PATIENT IDENTIFIER: Valerie [name] is a 85 y.o., female with a past medical history of HTN, Hyperlipidemia, CHF/CAD and carotid artery stenosis . She has followed with [facility] since 01/24/2019 for consultative assistance with management of her hyperthyroidism.   HISTORICAL SUMMARY: The patient was first diagnosed with hyperthyroidism in 12/2018 with a TSH of 0.179 uIU/mL with an elevated FT4 and T3 during evaluation for near syncope in the ED. She was started on Atenolol and Methimazole at the time.    She follows at  [facility] and would like to have labs there.  She did present to ED with SoB in 01/2019 and was diagnosed with new onset A.Fib    SUBJECTIVE:   During last visit (03/22/2019): Methimazole reduced to 2.5 mg daily    Today (05/24/2019):  Valerie [name] is here for a follow up on hyperthyroidism secondary to graves' disease. She is accompanied by her daughter Venida Jarvis. She has been compliant with methimazole dose 2.5 mg daily .    Her energy has improved, she denies any local neck symptoms.   Weight has been stable but continues with palpitations  She denies any diarrhea, tremors , or  GI symptoms.   She has chronic anxiety and that has been stable.      ROS:  As per HPI.   HISTORY:  Past Medical History:  Past Medical History:  Diagnosis Date  . Anxiety   . Arthritis   . Broken ribs   . Carotid artery disease ([place])   . Cataract   . Collar bone fracture    Right  . Depression   . Diverticulosis of colon with hemorrhage   . Duodenitis   . Esophageal reflux   . Esophageal stricture   . Essential hypertension   . Glaucoma   . Hiatal hernia   . History of kidney stones     x1   .  Hyperlipidemia   . IBS (irritable bowel syndrome)   . Palpitations   . UTI (urinary tract infection) March 2015   Past Surgical History:  Past Surgical History:  Procedure Laterality Date  . CHOLECYSTECTOMY    . COLONOSCOPY    . DILATION AND CURETTAGE OF UTERUS    . EYE SURGERY Bilateral    total of 5  . LASER PHOTO ABLATION Right 10/20/2016   Procedure: LASER PHOTO ABLATION;  Surgeon: Hayden Pedro, MD;  Location: [place];  Service: Ophthalmology;  Laterality: Right;  Headscope laser  . LEFT ROTATOR CUFF REPAIR X2 Left   . PARS PLANA VITRECTOMY  10/20/2016   WITH 25G REMOVAL/SUTURE SECONDARY INTRAOCULAR LENS, GAS FLUID EXCHANGE   . PARS PLANA VITRECTOMY Right 10/20/2016   Procedure: PARS PLANA VITRECTOMY WITH 25G REMOVAL/SUTURE SECONDARY INTRAOCULAR LENS, GAS FLUID EXCHANGE;  Surgeon: Hayden Pedro, MD;  Location: [place];  Service: Ophthalmology;  Laterality: Right;  . RIGHT ROTATOR CUFF REPAIR X1 Right   . UPPER GASTROINTESTINAL ENDOSCOPY     with dilation    Social History:  reports that she has never smoked. She has never used smokeless tobacco. She reports that she does not drink alcohol or use drugs. Family History:  Family History  Problem Relation Age of Onset  .  Leukemia Mother   . Colon cancer Father 30  . Kidney cancer Brother   . Bladder Cancer Brother   . Stroke Sister   . Esophageal cancer Neg Hx   . Stomach cancer Neg Hx      HOME MEDICATIONS: Allergies as of 05/24/2019      Reactions   Aricept [donepezil Hcl] Other (See Comments)   Nightmares, near syncope, weak, decreased appetite.   Lisinopril Other (See Comments)   Hair loss   Sulfa Antibiotics Nausea Only   Sulfasalazine Nausea Only      Medication List       Accurate as of May 24, 2019  7:39 AM. If you have any questions, ask your nurse or doctor.        acetaminophen 325 MG tablet Commonly known as: TYLENOL Take 650 mg by mouth every 6 (six) hours as needed for headache (pain).    amLODipine 5 MG tablet Commonly known as: NORVASC Take 1 tablet (5 mg total) by mouth daily.   apixaban 2.5 MG Tabs tablet Commonly known as: ELIQUIS Take 1 tablet (2.5 mg total) by mouth 2 (two) times daily.   carvedilol 6.25 MG tablet Commonly known as: COREG Take 1 tablet (6.25 mg total) by mouth 2 (two) times daily with a meal.   fluticasone 50 MCG/ACT nasal spray Commonly known as: FLONASE Place 2 sprays into both nostrils as needed for allergies or rhinitis.   methimazole 5 MG tablet Commonly known as: TAPAZOLE Take 0.5 tablets (2.5 mg total) by mouth daily.   nitroGLYCERIN 0.4 MG SL tablet Commonly known as: NITROSTAT Place 1 tablet (0.4 mg total) under the tongue every 5 (five) minutes as needed for chest pain.   pravastatin 10 MG tablet Commonly known as: PRAVACHOL TAKE 1 TABLET ONCE A DAY   Vitamin D (Ergocalciferol) 1.25 MG (50000 UT) Caps capsule Commonly known as: DRISDOL Take 1 capsule (50,000 Units total) by mouth every 7 (seven) days.         DATA REVIEWED: Results for Valerie [name], Valerie [name] (MRN [id number]) as of 05/24/2019 16:25  Ref. Range 03/30/2019 09:00 05/24/2019 09:38  TSH Latest Ref Range: 0.35 - 4.50 uIU/mL 3.340 2.81  T4,Free(Direct) Latest Ref Range: 0.60 - 1.60 ng/dL 0.87 0.85     Results for Valerie [name], Valerie [name] (MRN [id number]) as of 03/22/2019 08:11  Ref. Range 02/06/2019 15:57  Thyrotropin Receptor Ab Latest Ref Range: 0.00 - 1.75 IU/L 3.12 (H)    ASSESSMENT / PLAN / RECOMMENDATIONS:   1. Hyperthyroidism Secondary to Graves' Disease:  - Pt is clinically euthyroid - No local neck symptoms.  - No side effects to methimazole - Repeat TFT's are normal, no change will be made    Medications  Methimazole 2.5 mg daily    2. Graves' Disease:   - No extra-thyroidal manifestations of graves' disease.     F/u in 3 months  Labs today and in 6 weeks -pt provided with printed lab orders for future use    Signed electronically by: Mack Guise, MD  [facility]  [facility] [address] [place], [place] [address] Phone: [phone] FAX: [phone]      CC: Chevis Pretty, [place] [address] Phone: [phone]  Fax: [phone]   Return to Endocrinology clinic as below: Future Appointments  Date Time Provider [place]  05/24/2019  9:30 AM Bradon Fester, Melanie Crazier, MD [facility] None

## 2019-05-24 NOTE — Patient Instructions (Signed)
We recommend that you follow these hyperthyroidism instructions at home:  1) Take Methimazole 2.5 mg once a day  If you develop severe sore throat with high fevers OR develop unexplained yellowing of your skin, eyes, under your tongue, severe abdominal pain with nausea or vomiting --> then please get evaluated immediately.  2) Get repeat thyroid labs in 6 weeks .   It is ESSENTIAL to get follow-up labs to help avoid over or undertreatment of your hyperthyroidism - both of which can be dangerous to your health.

## 2019-05-29 ENCOUNTER — Other Ambulatory Visit: Payer: Self-pay | Admitting: Family

## 2019-06-09 ENCOUNTER — Telehealth: Payer: Self-pay | Admitting: Internal Medicine

## 2019-06-09 NOTE — Telephone Encounter (Signed)
lft pt vm informing her that her results have been released to her mychart but if she can not access it then to call the office back

## 2019-06-09 NOTE — Telephone Encounter (Signed)
Attempted to call pt

## 2019-06-09 NOTE — Telephone Encounter (Signed)
Patient requests to be called at ph#[phone] to be given her lab results

## 2019-06-15 ENCOUNTER — Other Ambulatory Visit: Payer: Self-pay | Admitting: Pharmacist

## 2019-06-15 MED ORDER — APIXABAN 2.5 MG PO TABS: 2.5000 mg | ORAL_TABLET | Freq: Two times a day (BID) | ORAL | 5 refills | Status: DC

## 2019-06-15 NOTE — Progress Notes (Signed)
Age 85, weight 45.8kg, SCr 0.96 on 03/27/19. Last OV on 03/06/19, afib indication.

## 2019-06-27 ENCOUNTER — Other Ambulatory Visit: Payer: Self-pay

## 2019-07-03 ENCOUNTER — Other Ambulatory Visit: Payer: Self-pay

## 2019-07-04 ENCOUNTER — Ambulatory Visit (INDEPENDENT_AMBULATORY_CARE_PROVIDER_SITE_OTHER): Payer: Medicare Other

## 2019-07-04 ENCOUNTER — Other Ambulatory Visit: Payer: Self-pay

## 2019-07-04 ENCOUNTER — Other Ambulatory Visit: Payer: Medicare Other

## 2019-07-04 DIAGNOSIS — Z23 Encounter for immunization: Secondary | ICD-10-CM | POA: Diagnosis not present

## 2019-07-04 DIAGNOSIS — E0590 Thyrotoxicosis, unspecified without thyrotoxic crisis or storm: Secondary | ICD-10-CM

## 2019-07-04 NOTE — Addendum Note (Signed)
Addended by: Earlene Plater on: 07/04/2019 10:17 AM   Modules accepted: Orders

## 2019-07-05 ENCOUNTER — Other Ambulatory Visit: Payer: Medicare Other

## 2019-07-05 LAB — TSH: TSH: 2.32 u[IU]/mL (ref 0.450–4.500)

## 2019-07-05 LAB — T4, FREE: Free T4: 1.12 ng/dL (ref 0.82–1.77)

## 2019-07-17 ENCOUNTER — Other Ambulatory Visit: Payer: Self-pay | Admitting: Cardiovascular Disease

## 2019-08-14 ENCOUNTER — Other Ambulatory Visit: Payer: Self-pay | Admitting: Family

## 2019-08-21 ENCOUNTER — Other Ambulatory Visit: Payer: Self-pay

## 2019-08-23 ENCOUNTER — Encounter: Payer: Self-pay | Admitting: Internal Medicine

## 2019-08-23 ENCOUNTER — Other Ambulatory Visit: Payer: Self-pay

## 2019-08-23 ENCOUNTER — Telehealth: Payer: Self-pay | Admitting: Family

## 2019-08-23 ENCOUNTER — Ambulatory Visit (INDEPENDENT_AMBULATORY_CARE_PROVIDER_SITE_OTHER): Payer: Medicare Other | Admitting: Internal Medicine

## 2019-08-23 VITALS — BP 110/68 | HR 63 | Temp 97.9°F | Ht 64.0 in | Wt 103.6 lb

## 2019-08-23 DIAGNOSIS — E0590 Thyrotoxicosis, unspecified without thyrotoxic crisis or storm: Secondary | ICD-10-CM | POA: Diagnosis not present

## 2019-08-23 DIAGNOSIS — E0500 Thyrotoxicosis with diffuse goiter without thyrotoxic crisis or storm: Secondary | ICD-10-CM | POA: Diagnosis not present

## 2019-08-23 LAB — TSH: TSH: 2.63 u[IU]/mL (ref 0.35–4.50)

## 2019-08-23 LAB — T4, FREE: Free T4: 0.83 ng/dL (ref 0.60–1.60)

## 2019-08-23 MED ORDER — CARVEDILOL 6.25 MG PO TABS: 6.2500 mg | ORAL_TABLET | Freq: Two times a day (BID) | ORAL | 2 refills | Status: DC

## 2019-08-23 NOTE — Telephone Encounter (Signed)
Contacted pharmacy and they stated that she needs refills. Advised I would send refills to pharmacy so they can deliver.

## 2019-08-23 NOTE — Progress Notes (Signed)
Name: Valerie [name]  MRN/ DOB: [id number], Aug 02, 1928    Age/ Sex: 85 y.o., female     PCP: Chevis Pretty, FNP   Reason for Endocrinology Evaluation: hyperthyroidism     Initial Endocrinology Clinic Visit: 01/24/2019    PATIENT IDENTIFIER: [name] is a 85 y.o., female with a past medical history of HTN, Hyperlipidemia, CHF/CAD and carotid artery stenosis . She has followed with [facility] since 01/24/2019 for consultative assistance with management of her hyperthyroidism.   HISTORICAL SUMMARY: The patient was first diagnosed with hyperthyroidism in 12/2018 with a TSH of 0.179 uIU/mL with an elevated FT4 and T3 during evaluation for near syncope in the ED. She was started on Atenolol and Methimazole at the time.    She follows at  [facility] and would like to have labs there.  She did present to ED with SoB in 01/2019 and was diagnosed with new onset A.Fib    SUBJECTIVE:   During last visit (05/24/2019): Continued  Methimazole 2.5 mg daily    Today (08/23/2019):  [name] is here for a follow up on hyperthyroidism secondary to graves' disease. She is accompanied by her daughter Valerie [name]. She has been compliant with methimazole dose 2.5 mg daily    Her energy has improved, she denies any local neck symptoms.   Weight has been stable but continues  with occasional palpitations  Has been having occasional diarrhea,but denies  tremors .  She has chronic anxiety and that has been stable.      ROS:  As per HPI.   HISTORY:  Past Medical History:  Past Medical History:  Diagnosis Date  . Anxiety   . Arthritis   . Broken ribs   . Carotid artery disease ([place])   . Cataract   . Collar bone fracture    Right  . Depression   . Diverticulosis of colon with hemorrhage   . Duodenitis   . Esophageal reflux   . Esophageal stricture   . Essential hypertension   . Glaucoma   . Hiatal hernia   . History of kidney stones    x1   . Hyperlipidemia   . IBS (irritable bowel syndrome)   . Palpitations   . UTI (urinary tract infection) March 2015   Past Surgical History:  Past Surgical History:  Procedure Laterality Date  . CHOLECYSTECTOMY    . COLONOSCOPY    . DILATION AND CURETTAGE OF UTERUS    . EYE SURGERY Bilateral    total of 5  . LASER PHOTO ABLATION Right 10/20/2016   Procedure: LASER PHOTO ABLATION;  Surgeon: Hayden Pedro, MD;  Location: [place];  Service: Ophthalmology;  Laterality: Right;  Headscope laser  . LEFT ROTATOR CUFF REPAIR X2 Left   . PARS PLANA VITRECTOMY  10/20/2016   WITH 25G REMOVAL/SUTURE SECONDARY INTRAOCULAR LENS, GAS FLUID EXCHANGE   . PARS PLANA VITRECTOMY Right 10/20/2016   Procedure: PARS PLANA VITRECTOMY WITH 25G REMOVAL/SUTURE SECONDARY INTRAOCULAR LENS, GAS FLUID EXCHANGE;  Surgeon: Hayden Pedro, MD;  Location: [place];  Service: Ophthalmology;  Laterality: Right;  . RIGHT ROTATOR CUFF REPAIR X1 Right   . UPPER GASTROINTESTINAL ENDOSCOPY     with dilation    Social History:  reports that she has never smoked. She has never used smokeless tobacco. She reports that she does not drink alcohol or use drugs. Family History:  Family History  Problem Relation Age of Onset  . Leukemia Mother   .  Colon cancer Father 60  . Kidney cancer Brother   . Bladder Cancer Brother   . Stroke Sister   . Esophageal cancer Neg Hx   . Stomach cancer Neg Hx      HOME MEDICATIONS: Allergies as of 08/23/2019      Reactions   Aricept [donepezil Hcl] Other (See Comments)   Nightmares, near syncope, weak, decreased appetite.   Lisinopril Other (See Comments)   Hair loss   Sulfa Antibiotics Nausea Only   Sulfasalazine Nausea Only      Medication List       Accurate as of August 23, 2019  7:22 AM. If you have any questions, ask your nurse or doctor.        acetaminophen 325 MG tablet Commonly known as: TYLENOL Take 650 mg by mouth every 6 (six) hours as needed for headache  (pain).   amLODipine 5 MG tablet Commonly known as: NORVASC TAKE 1 TABLET ONCE A DAY   apixaban 2.5 MG Tabs tablet Commonly known as: ELIQUIS Take 1 tablet (2.5 mg total) by mouth 2 (two) times daily.   carvedilol 6.25 MG tablet Commonly known as: COREG Take 1 tablet (6.25 mg total) by mouth 2 (two) times daily with a meal.   fluticasone 50 MCG/ACT nasal spray Commonly known as: FLONASE Place 2 sprays into both nostrils as needed for allergies or rhinitis.   methimazole 5 MG tablet Commonly known as: TAPAZOLE Take 0.5 tablets (2.5 mg total) by mouth daily.   nitroGLYCERIN 0.4 MG SL tablet Commonly known as: NITROSTAT Place 1 tablet (0.4 mg total) under the tongue every 5 (five) minutes as needed for chest pain.   pravastatin 10 MG tablet Commonly known as: PRAVACHOL TAKE 1 TABLET ONCE A DAY   Vitamin D (Ergocalciferol) 1.25 MG (50000 UT) Caps capsule Commonly known as: DRISDOL TAKE 1 CAPSULE EVERY 7 DAYS         DATA REVIEWED: Results for TASHIYA, KRUPA (MRN [id number]) as of 08/23/2019 14:38  Ref. Range 07/04/2019 10:18 08/23/2019 09:59  TSH Latest Ref Range: 0.35 - 4.50 uIU/mL 2.320 2.63  T4,Free(Direct) Latest Ref Range: 0.60 - 1.60 ng/dL  0.83      Results for JULEANNA, ISOM (MRN [id number]) as of 03/22/2019 08:11  Ref. Range 02/06/2019 15:57  Thyrotropin Receptor Ab Latest Ref Range: 0.00 - 1.75 IU/L 3.12 (H)    ASSESSMENT / PLAN / RECOMMENDATIONS:   1. Hyperthyroidism Secondary to Graves' Disease:  - Pt is clinically euthyroid - No local neck symptoms.  - No side effects to methimazole - Repeat TFT's are normal, no change will be made    Medications  Methimazole 2.5 mg daily    2. Graves' Disease:   - No extra-thyroidal manifestations of graves' disease.     F/u in 3 months    Signed electronically by: Mack Guise, MD  [facility]  [facility] [address] [place], [place] [address] Phone:  [phone] FAX: [phone]      CC: Chevis Pretty, [place] [address] Phone: [phone]  Fax: [phone]   Return to Endocrinology clinic as below: Future Appointments  Date Time Provider [place]  08/23/2019  9:30 AM Caston Coopersmith, Melanie Crazier, MD [facility] None  08/29/2019  8:40 AM Nahser, Wonda Cheng, MD [facility]

## 2019-08-23 NOTE — Patient Instructions (Signed)
We recommend that you follow these hyperthyroidism instructions at home:  1) Take Methimazole 2.5 mg once a day  If you develop severe sore throat with high fevers OR develop unexplained yellowing of your skin, eyes, under your tongue, severe abdominal pain with nausea or vomiting --> then please get evaluated immediately.    We will send the results of the blood work to the portal. Please make sure you check "mycart " messages  And contact us with any questions.

## 2019-08-29 ENCOUNTER — Ambulatory Visit: Payer: Medicare Other | Admitting: Cardiovascular Disease

## 2019-08-29 ENCOUNTER — Encounter: Payer: Self-pay | Admitting: Cardiovascular Disease

## 2019-08-29 ENCOUNTER — Other Ambulatory Visit: Payer: Self-pay

## 2019-08-29 VITALS — BP 110/68 | HR 64 | Ht 64.0 in | Wt 104.8 lb

## 2019-08-29 DIAGNOSIS — I10 Essential (primary) hypertension: Secondary | ICD-10-CM

## 2019-08-29 DIAGNOSIS — Z87898 Personal history of other specified conditions: Secondary | ICD-10-CM

## 2019-08-29 DIAGNOSIS — R5383 Other fatigue: Secondary | ICD-10-CM | POA: Diagnosis not present

## 2019-08-29 DIAGNOSIS — I480 Paroxysmal atrial fibrillation: Secondary | ICD-10-CM

## 2019-08-29 LAB — CBC
Hematocrit: 37.7 % (ref 34.0–46.6)
Hemoglobin: 13.5 g/dL (ref 11.1–15.9)
MCH: 35 pg — ABNORMAL HIGH (ref 26.6–33.0)
MCHC: 35.8 g/dL — ABNORMAL HIGH (ref 31.5–35.7)
MCV: 98 fL — ABNORMAL HIGH (ref 79–97)
Platelets: 336 10*3/uL (ref 150–450)
RBC: 3.86 x10E6/uL (ref 3.77–5.28)
RDW: 12.1 % (ref 11.7–15.4)
WBC: 7.7 10*3/uL (ref 3.4–10.8)

## 2019-08-29 LAB — BASIC METABOLIC PANEL
BUN/Creatinine Ratio: 24 (ref 12–28)
BUN: 21 mg/dL (ref 8–27)
CO2: 26 mmol/L (ref 20–29)
Calcium: 9.6 mg/dL (ref 8.7–10.3)
Chloride: 105 mmol/L (ref 96–106)
Creatinine, Ser: 0.89 mg/dL (ref 0.57–1.00)
GFR calc Af Amer: 66 mL/min/{1.73_m2} (ref >59)
GFR calc non Af Amer: 58 mL/min/{1.73_m2} — ABNORMAL LOW (ref >59)
Glucose: 80 mg/dL (ref 65–99)
Potassium: 4.7 mmol/L (ref 3.5–5.2)
Sodium: 141 mmol/L (ref 134–144)

## 2019-08-29 MED ORDER — CARVEDILOL 3.125 MG PO TABS: 3.1250 mg | ORAL_TABLET | Freq: Two times a day (BID) | ORAL | 1 refills | Status: DC

## 2019-08-29 NOTE — Progress Notes (Signed)
Cardiology Office Note   Date:  08/29/2019   ID:  Valerie [name], DOB 26-Feb-1928, MRN [id number]  PCP:  Chevis Pretty, FNP  Cardiologist:  Dr. Acie Fredrickson    Chief Complaint  Patient presents with  . Hypertension     Please note pt was followed by Dr. Mare Ferrari and with his retirement was seen once by Dr. Domenic Polite in [place], but her daughter who is followed by Dr. Acie Fredrickson would like her seen by Dr. Acie Fredrickson.  He has agreed to see pt.  Previous notes from [place]:  Valerie [name] is a 85 y.o. female who presents for HTN, bradycardia and dizziness.  PMH of HTN, HLD, anxiety, depression, GERD, CAD, glaucoma-underwent intervention approximately 3 months ago, lives alone but has family living next door who provide close supervision, presented to [facility] ED on 06/30/16 with complaints of ongoing blurred vision for several weeks post procedure,? Intermittent dyspnea and arm pain. Along with chest pain that she states is arthritic.  She checks her blood pressures regularly at home and apparently were elevated in the 220/95 range despite compliance with medications. She apparently has not felt well for a couple of days, headache, poor oral intake and visual changes since surgery for which she has recently been seen by her ophthalmologist and has follow-up in 2 months. In the ED, CT head without acute abnormalities, initial blood pressure 220/77. Admitted for hypertensive urgency.  Her troponin was negative.  Hx of neg nuc study in 2015.    She had HR to 46 (though looking back she has been slow before)..  Meds adjusted and plan for outpt echo.  She had complained about SOB but was not clear today she does have at times.   Was with hypokalemia and follow up was with K+ 3.3.    Hx of carotid disease with last check in 09/2015 with 1-39% bil stenosis. To be seen by vascular in Dec.   On her last visit she complained of dizziness.  Her HR was low so I stopped cardizem and added amlodipine.  Also on 48 hour  holter she was in SR 56 to 60 with occ episodes of sinus brady brief to 42 and occ PACs and PVCs.  Her echo with EF of 55 to 60% G2DD, mild Aortic regurg. LA mildly dialted. PA pk pressure 37 mmHg.   Today she feels better with less palpitations and less dizziness.  She does complain of hair loss.  This began with amlodipine.  She had hair loss with lisinopril as well.  No chest pain and no SOB.    November 26, 2016:  Valerie [name] is seen for the first time today.  Seen with Valerie [name] ( sister)  She is a transfer from Dr. Mare Ferrari. She has a history of hypertension and bradycardia.  BP has been up and down.  She takes it at home  BP has been as high as 185 and as low as 125 Goes out to eat once a week .   Does not pay attention to the salt in her food.   Tries to avoid fried foods.    Has rare episodes of palpitations . Perhaps twice a month. Worse when she is lying down  Had a 48 hr monitor  - revealed PACS and PVCs   August 30, 2017:  Seen with Son,  Valerie [name]  Hx of HTN , blood pressure is high today.  She makes no effort to avoid salt. No cardiac issues [place] , broke  some ribs  Was in the hospital recently for HTN , ( felt some eye pressure )  Still eating some canned foods.   March 09, 2018:  Valerie [name] is seen today for follow-up of her hypertension. Has had some swimmyheadedness - has a cold. Has felt poorly for the past for 3 weeks.  Has had a URI for those 3 weeks No syncope   Dec. 11, 2019:   Has a cold today No syncope No dizziness HR is much better off the metoprolol   Dec. 1, 2020  Has felt well until yesterday  Has not felt well today  No fever, no cough. Has some indigestion  Belched lots yesterday  Has been found to have Graves disease since I last saw her .  Has been hyperthyroid and is on Mithimazole   Hospitalized in May, 2020 with acute diastolic congestive heart failure.  She was found to have atrial fibrillation at that time.  He was started on Eliquis.     Past Medical History:  Diagnosis Date  . Anxiety   . Arthritis   . Broken ribs   . Carotid artery disease ([place])   . Cataract   . Collar bone fracture    Right  . Depression   . Diverticulosis of colon with hemorrhage   . Duodenitis   . Esophageal reflux   . Esophageal stricture   . Essential hypertension   . Glaucoma   . Hiatal hernia   . History of kidney stones     x1   . Hyperlipidemia   . IBS (irritable bowel syndrome)   . Palpitations   . UTI (urinary tract infection) March 2015    Past Surgical History:  Procedure Laterality Date  . CHOLECYSTECTOMY    . COLONOSCOPY    . DILATION AND CURETTAGE OF UTERUS    . EYE SURGERY Bilateral    total of 5  . LASER PHOTO ABLATION Right 10/20/2016   Procedure: LASER PHOTO ABLATION;  Surgeon: Hayden Pedro, MD;  Location: [facility];  Service: Ophthalmology;  Laterality: Right;  Headscope laser  . LEFT ROTATOR CUFF REPAIR X2 Left   . PARS PLANA VITRECTOMY  10/20/2016   WITH 25G REMOVAL/SUTURE SECONDARY INTRAOCULAR LENS, GAS FLUID EXCHANGE   . PARS PLANA VITRECTOMY Right 10/20/2016   Procedure: PARS PLANA VITRECTOMY WITH 25G REMOVAL/SUTURE SECONDARY INTRAOCULAR LENS, GAS FLUID EXCHANGE;  Surgeon: Hayden Pedro, MD;  Location: [facility];  Service: Ophthalmology;  Laterality: Right;  . RIGHT ROTATOR CUFF REPAIR X1 Right   . UPPER GASTROINTESTINAL ENDOSCOPY     with dilation     Current Outpatient Medications  Medication Sig Dispense Refill  . acetaminophen (TYLENOL) 325 MG tablet Take 650 mg by mouth every 6 (six) hours as needed for headache (pain).    Marland Kitchen amLODipine (NORVASC) 5 MG tablet TAKE 1 TABLET ONCE A DAY 90 tablet 2  . apixaban (ELIQUIS) 2.5 MG TABS tablet Take 1 tablet (2.5 mg total) by mouth 2 (two) times daily. 60 tablet 5  . carvedilol (COREG) 6.25 MG tablet Take 1 tablet (6.25 mg total) by mouth 2 (two) times daily with a meal. 60 tablet 2  . fluticasone (FLONASE) 50 MCG/ACT nasal spray Place 2 sprays into both nostrils  as needed for allergies or rhinitis.    . methimazole (TAPAZOLE) 5 MG tablet Take 0.5 tablets (2.5 mg total) by mouth daily. 30 tablet 2  . nitroGLYCERIN (NITROSTAT) 0.4 MG SL tablet Place 1 tablet (0.4 mg total) under  the tongue every 5 (five) minutes as needed for chest pain. 25 tablet 3  . pravastatin (PRAVACHOL) 10 MG tablet TAKE 1 TABLET ONCE A DAY 90 tablet 3  . Vitamin D, Ergocalciferol, (DRISDOL) 1.25 MG (50000 UT) CAPS capsule TAKE 1 CAPSULE EVERY 7 DAYS 12 capsule 0   No current facility-administered medications for this visit.     Allergies:   Aricept [donepezil hcl], Lisinopril, Sulfa antibiotics, and Sulfasalazine    Social History:  The patient  reports that she has never smoked. She has never used smokeless tobacco. She reports that she does not drink alcohol or use drugs.   Family History:  The patient's family history includes Bladder Cancer in her brother; Colon cancer (age of onset: 39) in her father; Kidney cancer in her brother; Leukemia in her mother; Stroke in her sister.    Review of systems:    As per current history.  Otherwise the review of systems is negative.   Physical Exam: Blood pressure 110/68, pulse (!) 52, height 5\' 4"  (1.626 m), weight 104 lb 12.8 oz (47.5 kg), SpO2 98 %.  GEN: Elderly female, no acute distress HEENT: Normal NECK: No JVD; No carotid bruits LYMPHATICS: No lymphadenopathy CARDIAC: RRR , no murmurs, rubs, gallops RESPIRATORY:  Clear to auscultation without rales, wheezing or rhonchi  ABDOMEN: Soft, non-tender, non-distended MUSCULOSKELETAL:  No edema; No deformity  SKIN: Warm and dry NEUROLOGIC:  Alert and oriented x 3   EKG:      Recent Labs: 02/17/2019: ALT 16; B Natriuretic Peptide 914.9 02/19/2019: Magnesium 1.6 03/15/2019: Hemoglobin 14.0; Platelets 343 03/27/2019: BUN 20; Creatinine, Ser 0.96; NT-Pro BNP 335; Potassium 4.6; Sodium 138 08/23/2019: TSH 2.63   Lipid Panel    Component Value Date/Time   CHOL 186 03/09/2018  1022   TRIG 156 (H) 03/09/2018 1022   HDL 51 03/09/2018 1022   CHOLHDL 3.6 03/09/2018 1022   CHOLHDL 4 12/05/2014 0946   VLDL 26.0 12/05/2014 0946   LDLCALC 104 (H) 03/09/2018 1022     Other studies Reviewed: Additional studies/ records that were reviewed today include:  Echo 07/31/16 Study Conclusions  - Left ventricle: The cavity size was normal. There was mild focal   basal hypertrophy of the septum. Systolic function was normal.   The estimated ejection fraction was in the range of 55% to 60%.   Wall motion was normal; there were no regional wall motion   abnormalities. Features are consistent with a pseudonormal left   ventricular filling pattern, with concomitant abnormal relaxation   and increased filling pressure (grade 2 diastolic dysfunction). - Aortic valve: There was mild regurgitation. - Mitral valve: Calcified annulus. There was mild regurgitation. - Left atrium: The atrium was mildly dilated. - Pulmonary arteries: Systolic pressure was mildly increased. PA   peak pressure: 37 mm Hg (S).   ASSESSMENT AND PLAN:  1.  HTN:     Blood pressure is well controlled.  2.  Paroxysmal atrial fibrillation: The patient was found to have atrial fibrillation when she was hospitalized in May for an episode of hyperthyroidism due to Graves' disease.  She has been on methimazole and also has been on carvedilol.  She is converted back to normal sinus rhythm as of today.  Her heart rate is a little slow and she is feeling fatigued.  We will decrease the carvedilol to 3.125 mg twice a day.  3. Aortic Insufficiency :   Stable   4. Hyperlipidemia: Stable.  Continue current medications.  4. Carotid  disease:    Stable.  No symptoms.  Followed by [facility]   5.  Generalized fatigue.  We will check a CBC and basic metabolic profile today because she is fatigued.  She denies passing any bloody stools.  Her thyroid levels were checked just several weeks ago and they look good.   Current  medicines are reviewed with the patient today.  The patient Has no concerns regarding medicines.  The following changes have been made:  See above Labs/ tests ordered today include:see above   will see her in a year   Signed, Mertie Moores, MD  08/29/2019 [place], [place], [place] Phone: [phone]; Fax: [phone]

## 2019-08-29 NOTE — Patient Instructions (Signed)
Medication Instructions:   DECREASE YOUR CARVEDILOL (COREG) TO 3.125 MG BY MOUTH TWICE DAILY  *If you need a refill on your cardiac medications before your next appointment, please call your pharmacy*   Lab Work:  TODAY-BMET AND CBC  If you have labs (blood work) drawn today and your tests are completely normal, you will receive your results only by: Marland Kitchen MyChart Message (if you have MyChart) OR . A paper copy in the mail If you have any lab test that is abnormal or we need to change your treatment, we will call you to review the results.    Follow-Up: At [facility], you and your health needs are our priority.  As part of our continuing mission to provide you with exceptional heart care, we have created designated Provider Care Teams.  These Care Teams include your primary Cardiologist (physician) and Advanced Practice Providers (APPs -  Physician Assistants and Nurse Practitioners) who all work together to provide you with the care you need, when you need it.  Your next appointment:   3 month(s)  The format for your next appointment:   In Person  Provider:   WITH DR. Acie Fredrickson OR AN APP ON HIS TEAM IN 3 MONTHS IN PERSON

## 2019-09-01 NOTE — Addendum Note (Signed)
Addended by: Jones Broom on: 09/01/2019 08:16 AM   Modules accepted: Orders

## 2019-09-12 ENCOUNTER — Telehealth: Payer: Self-pay

## 2019-09-12 NOTE — Telephone Encounter (Signed)
Attempted to call pt daughter, [name] back to get more information but she did not answer, please advise

## 2019-09-12 NOTE — Telephone Encounter (Signed)
Patients daughter called in wanting to let the Dr know that patient was having bad stomach pains and also diarrhea. That has only lasted for 24 hours      medication :  methimazole (TAPAZOLE) 5 MG tablet    Please call and advise

## 2019-09-12 NOTE — Telephone Encounter (Signed)
Pt informed

## 2019-09-12 NOTE — Telephone Encounter (Signed)
Pt stated that she has had this 3-4 days and would like to know what could be the cause of it also what she can eat and drink that will stay down, please advise.

## 2019-09-13 ENCOUNTER — Other Ambulatory Visit: Payer: Self-pay

## 2019-09-14 ENCOUNTER — Ambulatory Visit: Payer: Medicare Other | Admitting: Nurse Practitioner

## 2019-09-15 ENCOUNTER — Encounter: Payer: Self-pay | Admitting: Nurse Practitioner

## 2019-09-15 ENCOUNTER — Ambulatory Visit (INDEPENDENT_AMBULATORY_CARE_PROVIDER_SITE_OTHER): Payer: Medicare Other | Admitting: Nurse Practitioner

## 2019-09-15 DIAGNOSIS — K2900 Acute gastritis without bleeding: Secondary | ICD-10-CM | POA: Diagnosis not present

## 2019-09-15 MED ORDER — PANTOPRAZOLE SODIUM 40 MG PO TBEC: 40.0000 mg | DELAYED_RELEASE_TABLET | Freq: Every day | ORAL | 3 refills | Status: DC

## 2019-09-15 NOTE — Progress Notes (Signed)
   Virtual Visit via telephone Note Due to COVID-19 pandemic this visit was conducted virtually. This visit type was conducted due to national recommendations for restrictions regarding the COVID-19 Pandemic (e.g. social distancing, sheltering in place) in an effort to limit this patient's exposure and mitigate transmission in our community. All issues noted in this document were discussed and addressed.  A physical exam was not performed with this format.  I connected with Valerie [name] on 09/15/19 at 9:30 by telephone and verified that I am speaking with the correct person using two identifiers. Valerie [name] is currently located at home and her son is currently with her during visit. The provider, Mary-Margaret Hassell Done, FNP is located in their office at time of visit.  I discussed the limitations, risks, security and privacy concerns of performing an evaluation and management service by telephone and the availability of in person appointments. I also discussed with the patient that there may be a patient responsible charge related to this service. The patient expressed understanding and agreed to proceed.   History and Present Illness:   Chief Complaint: Abdominal Pain   HPI Patient calls in c/o stomach pain. Started a couple of days ago. She had diarrhea a couple of days. She has been belching a lot and that seems to ease her pain. She has ben using pepto bismol. Pain has gotten some better today. Appetite is good but she is afraid to eat becuase she is afraid it will hurt her stomach more.   Review of Systems  Respiratory: Negative.   Cardiovascular: Negative.   Gastrointestinal: Positive for abdominal pain. Negative for constipation, diarrhea, nausea and vomiting.  Neurological: Negative.   Psychiatric/Behavioral: Negative.   All other systems reviewed and are negative.    Observations/Objective: Alert and oriented- answers all questions appropriately No distress    Assessment  and Plan: Valerie [name] in today with chief complaint of Abdominal Pain   1. Other acute gastritis without hemorrhage Gas-x or beano Avoid spicy and fatty foods Bland diet for 24-48 hours If not better NTBS - pantoprazole (PROTONIX) 40 MG tablet; Take 1 tablet (40 mg total) by mouth daily.  Dispense: 30 tablet; Refill: 3   Follow Up Instructions: prn    I discussed the assessment and treatment plan with the patient. The patient was provided an opportunity to ask questions and all were answered. The patient agreed with the plan and demonstrated an understanding of the instructions.   The patient was advised to call back or seek an in-person evaluation if the symptoms worsen or if the condition fails to improve as anticipated.  The above assessment and management plan was discussed with the patient. The patient verbalized understanding of and has agreed to the management plan. Patient is aware to call the clinic if symptoms persist or worsen. Patient is aware when to return to the clinic for a follow-up visit. Patient educated on when it is appropriate to go to the emergency department.   Time call ended:  9:50  I provided 20 minutes of non-face-to-face time during this encounter.    Mary-Margaret Hassell Done, FNP

## 2019-09-26 IMAGING — DX DG CHEST 2V
2 series · 2 of 2 positions shown · non-contrast
Comparison: 08/25/2017 and 07/01/2016

CLINICAL DATA: Shortness of breath.

EXAM:
CHEST - 2 VIEW

[chest pa]
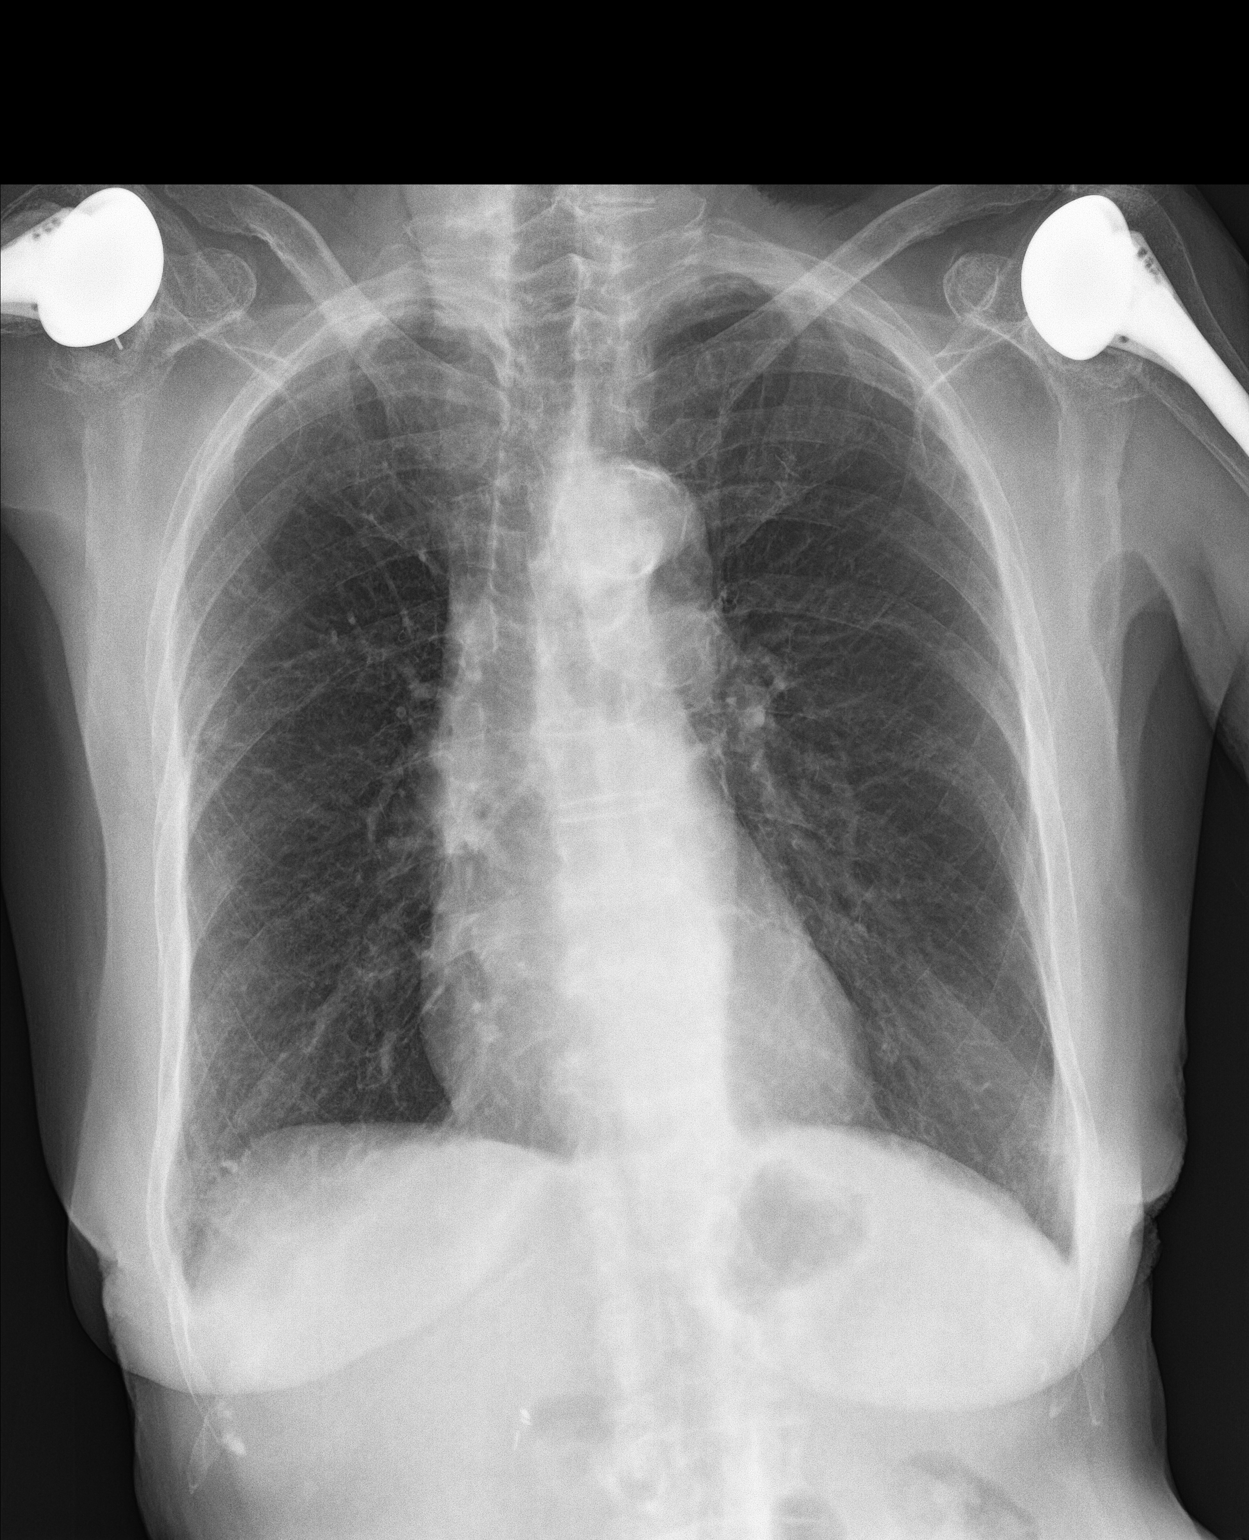

[chest lat]
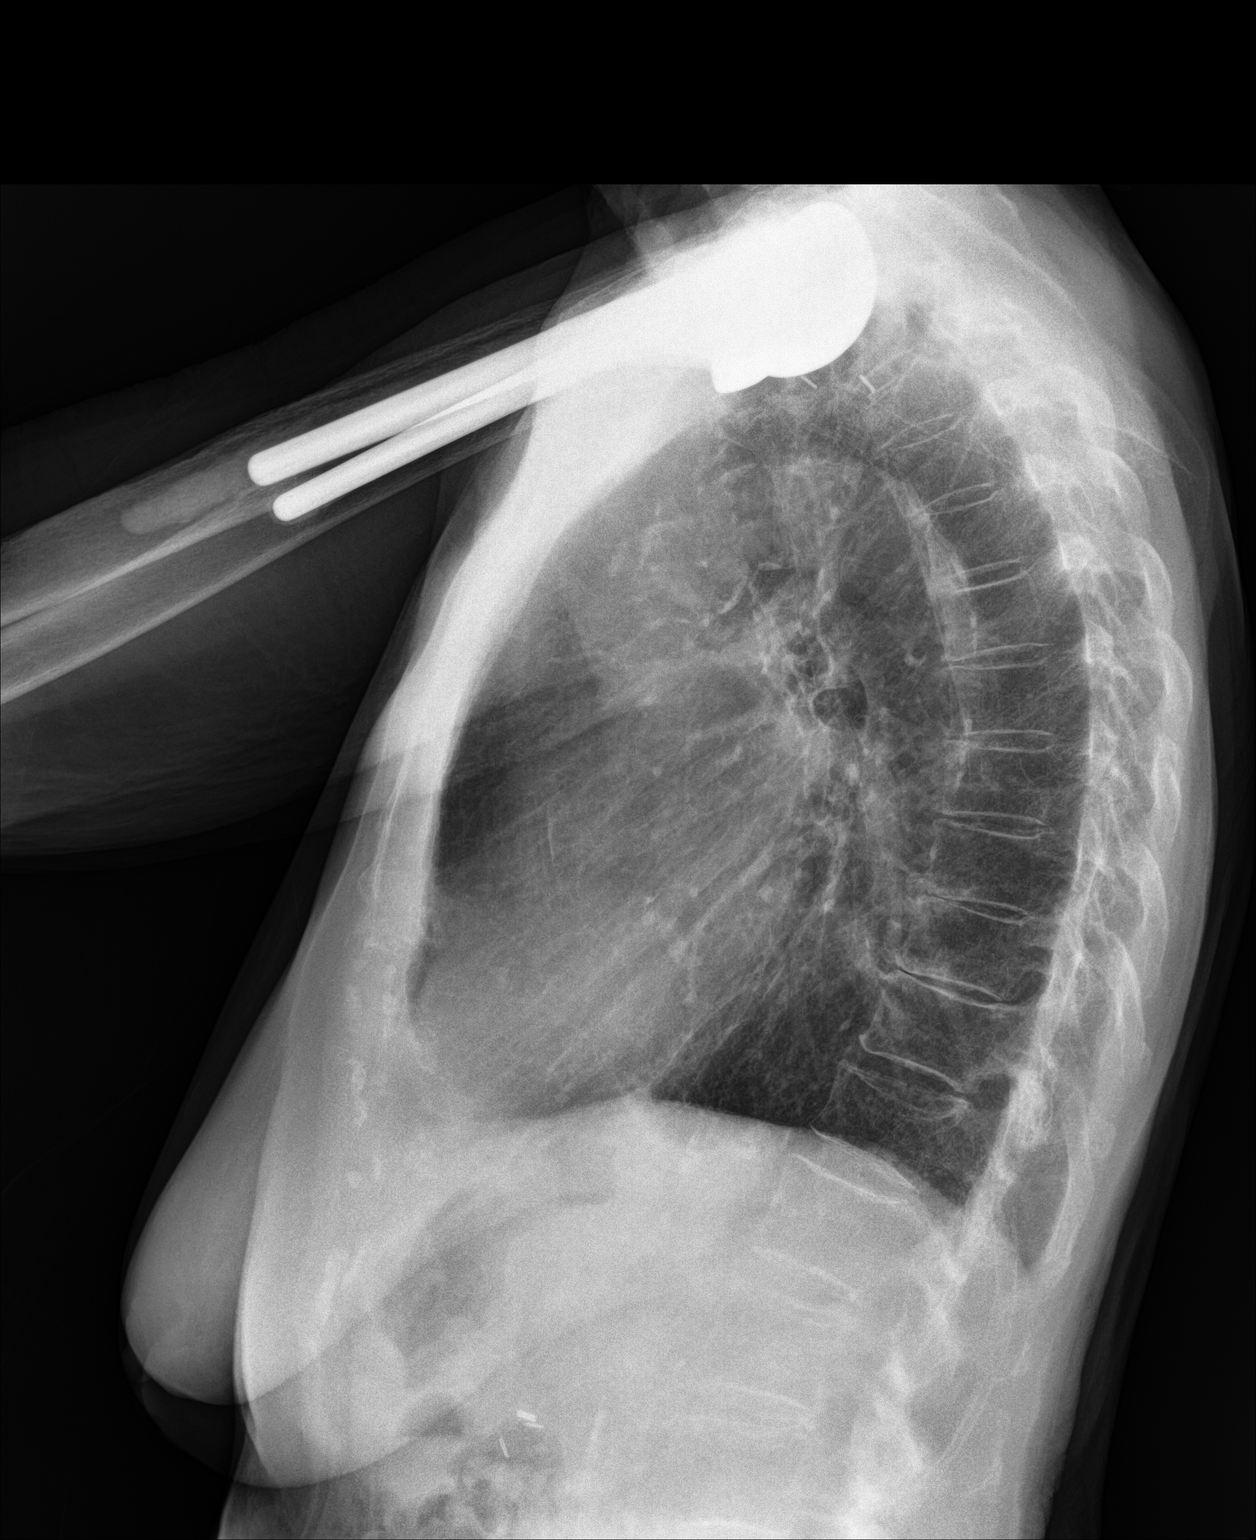

[2 of 2 positions shown; findings below may reference images not displayed]

FINDINGS: The heart size and pulmonary vascularity are normal. Calcification
of the thoracic aorta.

No infiltrates or effusions. Minimal scarring at the right lung base
laterally. Old slight compression deformity of superior endplate of
probably T11 or T12.

Bilateral proximal humeral prostheses.
IMPRESSION: No acute abnormalities.

Aortic Atherosclerosis ([id number]).

## 2019-10-05 ENCOUNTER — Other Ambulatory Visit: Payer: Self-pay | Admitting: Internal Medicine

## 2019-11-02 ENCOUNTER — Encounter: Payer: Self-pay | Admitting: Nurse Practitioner

## 2019-11-02 ENCOUNTER — Ambulatory Visit: Payer: Medicare Other | Admitting: Family Medicine

## 2019-11-02 ENCOUNTER — Telehealth: Payer: Medicare Other | Admitting: Physician Assistant

## 2019-11-02 DIAGNOSIS — R0602 Shortness of breath: Secondary | ICD-10-CM

## 2019-11-02 MED ORDER — BENZONATATE 100 MG PO CAPS: 100.0000 mg | ORAL_CAPSULE | Freq: Three times a day (TID) | ORAL | 0 refills | Status: DC | PRN

## 2019-11-02 MED ORDER — FLUTICASONE PROPIONATE 50 MCG/ACT NA SUSP: 2.0000 | NASAL | 1 refills | Status: DC | PRN

## 2019-11-02 NOTE — Progress Notes (Signed)
We are sorry you are not feeling well.  Here is how we plan to help!  Based on what you have shared with me, it looks like you may have a viral upper respiratory infection.  Upper respiratory infections are caused by a large number of viruses; however, rhinovirus is the most common cause.   Symptoms vary from person to person, with common symptoms including sore throat, cough, fatigue or lack of energy and feeling of general discomfort.  A low-grade fever of up to 100.4 may present, but is often uncommon.  Symptoms vary however, and are closely related to a person's age or underlying illnesses.  The most common symptoms associated with an upper respiratory infection are nasal discharge or congestion, cough, sneezing, headache and pressure in the ears and face.  These symptoms usually persist for about 3 to 10 days, but can last up to 2 weeks.  It is important to know that upper respiratory infections do not cause serious illness or complications in most cases.    Upper respiratory infections can be transmitted from person to person, with the most common method of transmission being a person's hands.  The virus is able to live on the skin and can infect other persons for up to 2 hours after direct contact.  Also, these can be transmitted when someone coughs or sneezes; thus, it is important to cover the mouth to reduce this risk.  To keep the spread of the illness at bay, good hand hygiene is very important.  This is an infection that is most likely caused by a virus. There are no specific treatments other than to help you with the symptoms until the infection runs its course.  We are sorry you are not feeling well.  Here is how we plan to help!   For nasal congestion, you may use an oral decongestants such as Mucinex D or if you have glaucoma or high blood pressure use plain Mucinex.  Saline nasal spray or nasal drops can help and can safely be used as often as needed for congestion.  For your congestion,  I have prescribed Fluticasone nasal spray one spray in each nostril twice a day  If you do not have a history of heart disease, hypertension, diabetes or thyroid disease, prostate/bladder issues or glaucoma, you may also use Sudafed to treat nasal congestion.  It is highly recommended that you consult with a pharmacist or your primary care physician to ensure this medication is safe for you to take.     If you have a cough, you may use cough suppressants such as Delsym and Robitussin.  If you have glaucoma or high blood pressure, you can also use Coricidin HBP.   For cough I have prescribed for you A prescription cough medication called Tessalon Perles 100 mg. You may take 1-2 capsules every 8 hours as needed for cough  If you have a sore or scratchy throat, use a saltwater gargle-  to  teaspoon of salt dissolved in a 4-ounce to 8-ounce glass of warm water.  Gargle the solution for approximately 15-30 seconds and then spit.  It is important not to swallow the solution.  You can also use throat lozenges/cough drops and Chloraseptic spray to help with throat pain or discomfort.  Warm or cold liquids can also be helpful in relieving throat pain.  For headache, pain or general discomfort, you can use Ibuprofen or Tylenol as directed.   Some authorities believe that zinc sprays or the use of   Echinacea may shorten the course of your symptoms.   HOME CARE . Only take medications as instructed by your medical team. . Be sure to drink plenty of fluids. Water is fine as well as fruit juices, sodas and electrolyte beverages. You may want to stay away from caffeine or alcohol. If you are nauseated, try taking small sips of liquids. How do you know if you are getting enough fluid? Your urine should be a pale yellow or almost colorless. . Get rest. . Taking a steamy shower or using a humidifier may help nasal congestion and ease sore throat pain. You can place a towel over your head and breathe in the steam  from hot water coming from a faucet. . Using a saline nasal spray works much the same way. . Cough drops, hard candies and sore throat lozenges may ease your cough. . Avoid close contacts especially the very young and the elderly . Cover your mouth if you cough or sneeze . Always remember to wash your hands.   GET HELP RIGHT AWAY IF: . You develop worsening fever. . If your symptoms do not improve within 10 days . You develop yellow or green discharge from your nose over 3 days. . You have coughing fits . You develop a severe head ache or visual changes. . You develop shortness of breath, difficulty breathing or start having chest pain . Your symptoms persist after you have completed your treatment plan  MAKE SURE YOU   Understand these instructions.  Will watch your condition.  Will get help right away if you are not doing well or get worse.  Your e-visit answers were reviewed by a board certified advanced clinical practitioner to complete your personal care plan. Depending upon the condition, your plan could have included both over the counter or prescription medications. Please review your pharmacy choice. If there is a problem, you may call our nursing hot line at and have the prescription routed to another pharmacy. Your safety is important to [place]. If you have drug allergies check your prescription carefully.   You can use MyChart to ask questions about today's visit, request a non-urgent call back, or ask for a work or school excuse for 24 hours related to this e-Visit. If it has been greater than 24 hours you will need to follow up with your provider, or enter a new e-Visit to address those concerns. You will get an e-mail in the next two days asking about your experience.  I hope that your e-visit has been valuable and will speed your recovery. Thank you for using e-visits.   Particia Nearing PA-C  Approximately 5 minutes was spent documenting and reviewing patient's chart.

## 2019-11-08 ENCOUNTER — Ambulatory Visit: Payer: Medicare Other | Admitting: Family Medicine

## 2019-11-10 DIAGNOSIS — H35352 Cystoid macular degeneration, left eye: Secondary | ICD-10-CM | POA: Diagnosis not present

## 2019-11-15 DIAGNOSIS — C44329 Squamous cell carcinoma of skin of other parts of face: Secondary | ICD-10-CM | POA: Diagnosis not present

## 2019-11-15 DIAGNOSIS — L570 Actinic keratosis: Secondary | ICD-10-CM | POA: Diagnosis not present

## 2019-11-20 ENCOUNTER — Other Ambulatory Visit: Payer: Self-pay | Admitting: Family

## 2019-11-20 ENCOUNTER — Encounter (HOSPITAL_COMMUNITY): Payer: Medicare Other

## 2019-11-20 ENCOUNTER — Telehealth: Payer: Self-pay

## 2019-11-20 ENCOUNTER — Ambulatory Visit: Payer: Medicare Other

## 2019-11-20 MED ORDER — VITAMIN D (ERGOCALCIFEROL) 1.25 MG (50000 UNIT) PO CAPS: 50000.0000 [IU] | ORAL_CAPSULE | ORAL | 0 refills | Status: DC

## 2019-11-20 NOTE — Telephone Encounter (Signed)
Vitamin d rx sent to pharmacy.

## 2019-11-20 NOTE — Telephone Encounter (Signed)
Received refill request from pt's pharmacy for 50,000 units weekly of Vitamin D. Pt has been seen in the office but she may need vitamin d level drawn before a refill can be given. Please review.

## 2019-11-23 ENCOUNTER — Other Ambulatory Visit: Payer: Self-pay

## 2019-11-24 NOTE — Progress Notes (Signed)
Name: Valerie [name]  MRN/ DOB: [id number], Apr 22, 1928    Age/ Sex: 86 y.o., female     PCP: Chevis Pretty, FNP   Reason for Endocrinology Evaluation: hyperthyroidism     Initial Endocrinology Clinic Visit: 01/24/2019    PATIENT IDENTIFIER: Valerie [name] is a 86 y.o., female with a past medical history of HTN, Hyperlipidemia, CHF/CAD and carotid artery stenosis . She has followed with [facility] since 01/24/2019 for consultative assistance with management of her hyperthyroidism.   HISTORICAL SUMMARY: The patient was first diagnosed with hyperthyroidism in 12/2018 with a TSH of 0.179 uIU/mL with an elevated FT4 and T3 during evaluation for near syncope in the ED. She was started on Atenolol and Methimazole at the time.    She follows at  [facility] and would like to have labs there.  She did present to ED with SoB in 01/2019 and was diagnosed with new onset A.Fib    SUBJECTIVE:   During last visit (08/23/2019): Continued  Methimazole 2.5 mg daily    Today (11/27/2019):  Valerie [name] is here for a follow up on hyperthyroidism secondary to graves' disease. She is accompanied by her daughter Valerie [name]. She has been compliant with methimazole dose 2.5 mg daily   She has not felt good lately, she has lower abdominal pains   Denies dysuria or blood in the urine   Her energy has improved,  She denies any local neck symptoms.   Weight has been stable but continues  with occasional palpitations  Has rare diarrhea , denies tremors  Denies local neck symptoms   She has chronic anxiety and that has been stable.      ROS:  As per HPI.   HISTORY:  Past Medical History:  Past Medical History:  Diagnosis Date  . Anxiety   . Arthritis   . Broken ribs   . Carotid artery disease ([place])   . Cataract   . Collar bone fracture    Right  . Depression   . Diverticulosis of colon with hemorrhage   . Duodenitis   . Esophageal reflux   .  Esophageal stricture   . Essential hypertension   . Glaucoma   . Hiatal hernia   . History of kidney stones     x1   . Hyperlipidemia   . IBS (irritable bowel syndrome)   . Palpitations   . UTI (urinary tract infection) March 2015   Past Surgical History:  Past Surgical History:  Procedure Laterality Date  . CHOLECYSTECTOMY    . COLONOSCOPY    . DILATION AND CURETTAGE OF UTERUS    . EYE SURGERY Bilateral    total of 5  . LASER PHOTO ABLATION Right 10/20/2016   Procedure: LASER PHOTO ABLATION;  Surgeon: Hayden Pedro, MD;  Location: [place];  Service: Ophthalmology;  Laterality: Right;  Headscope laser  . LEFT ROTATOR CUFF REPAIR X2 Left   . PARS PLANA VITRECTOMY  10/20/2016   WITH 25G REMOVAL/SUTURE SECONDARY INTRAOCULAR LENS, GAS FLUID EXCHANGE   . PARS PLANA VITRECTOMY Right 10/20/2016   Procedure: PARS PLANA VITRECTOMY WITH 25G REMOVAL/SUTURE SECONDARY INTRAOCULAR LENS, GAS FLUID EXCHANGE;  Surgeon: Hayden Pedro, MD;  Location: [place];  Service: Ophthalmology;  Laterality: Right;  . RIGHT ROTATOR CUFF REPAIR X1 Right   . UPPER GASTROINTESTINAL ENDOSCOPY     with dilation    Social History:  reports that she has never smoked. She has never used smokeless  tobacco. She reports that she does not drink alcohol or use drugs. Family History:  Family History  Problem Relation Age of Onset  . Leukemia Mother   . Colon cancer Father 73  . Kidney cancer Brother   . Bladder Cancer Brother   . Stroke Sister   . Esophageal cancer Neg Hx   . Stomach cancer Neg Hx      HOME MEDICATIONS: Allergies as of 11/27/2019      Reactions   Aricept [donepezil Hcl] Other (See Comments)   Nightmares, near syncope, weak, decreased appetite.   Lisinopril Other (See Comments)   Hair loss   Sulfa Antibiotics Nausea Only   Sulfasalazine Nausea Only      Medication List       Accurate as of November 27, 2019  7:24 AM. If you have any questions, ask your nurse or doctor.        acetaminophen  325 MG tablet Commonly known as: TYLENOL Take 650 mg by mouth every 6 (six) hours as needed for headache (pain).   amLODipine 5 MG tablet Commonly known as: NORVASC TAKE 1 TABLET ONCE A DAY   apixaban 2.5 MG Tabs tablet Commonly known as: ELIQUIS Take 1 tablet (2.5 mg total) by mouth 2 (two) times daily.   benzonatate 100 MG capsule Commonly known as: TESSALON Take 1-2 capsules (100-200 mg total) by mouth 3 (three) times daily as needed for cough.   carvedilol 3.125 MG tablet Commonly known as: Coreg Take 1 tablet (3.125 mg total) by mouth 2 (two) times daily with a meal.   fluticasone 50 MCG/ACT nasal spray Commonly known as: FLONASE Place 2 sprays into both nostrils as needed for allergies or rhinitis.   methimazole 5 MG tablet Commonly known as: TAPAZOLE TAKE (1/2) TABLET DAILY.   nitroGLYCERIN 0.4 MG SL tablet Commonly known as: NITROSTAT Place 1 tablet (0.4 mg total) under the tongue every 5 (five) minutes as needed for chest pain.   pantoprazole 40 MG tablet Commonly known as: PROTONIX Take 1 tablet (40 mg total) by mouth daily.   pravastatin 10 MG tablet Commonly known as: PRAVACHOL TAKE 1 TABLET ONCE A DAY   Vitamin D (Ergocalciferol) 1.25 MG (50000 UNIT) Caps capsule Commonly known as: DRISDOL Take 1 capsule (50,000 Units total) by mouth every 7 (seven) days.         DATA REVIEWED:  Results for Valerie [name], Valerie [name] (MRN [id number]) as of 11/27/2019 14:51  Ref. Range 11/27/2019 08:34  TSH Latest Ref Range: 0.35 - 4.50 uIU/mL 2.13  T4,Free(Direct) Latest Ref Range: 0.60 - 1.60 ng/dL 0.85      Results for Valerie [name], Valerie [name] (MRN [id number]) as of 03/22/2019 08:11  Ref. Range 02/06/2019 15:57  Thyrotropin Receptor Ab Latest Ref Range: 0.00 - 1.75 IU/L 3.12 (H)    ASSESSMENT / PLAN / RECOMMENDATIONS:   1. Hyperthyroidism Secondary to Graves' Disease:  - Pt is clinically euthyroid - No local neck symptoms.  - No side effects to methimazole - Repeat TFT's are  normal, no change will be made    Medications  Methimazole 2.5 mg daily    2. Graves' Disease:   - No extra-thyroidal manifestations of graves' disease.     F/u in 6 months    Signed electronically by: Mack Guise, MD  [facility] Endocrinology  [facility] [address] [place], [place] [address] Phone: [phone] FAX: [phone]      CC: Chevis Pretty, Center  [place] [address] Phone: [phone]  Fax: [phone]   Return to Endocrinology clinic as below: Future Appointments  Date Time Provider [place]  11/27/2019  8:10 AM Nazaiah Navarrete, Melanie Crazier, MD [facility] None  12/05/2019  9:00 AM Nahser, Wonda Cheng, MD [facility]

## 2019-11-27 ENCOUNTER — Other Ambulatory Visit: Payer: Self-pay

## 2019-11-27 ENCOUNTER — Encounter: Payer: Self-pay | Admitting: Internal Medicine

## 2019-11-27 ENCOUNTER — Ambulatory Visit: Payer: Medicare PPO | Admitting: Internal Medicine

## 2019-11-27 VITALS — BP 138/70 | HR 62 | Temp 97.7°F | Ht 64.0 in | Wt 106.2 lb

## 2019-11-27 DIAGNOSIS — E0500 Thyrotoxicosis with diffuse goiter without thyrotoxic crisis or storm: Secondary | ICD-10-CM | POA: Diagnosis not present

## 2019-11-27 LAB — TSH: TSH: 2.13 u[IU]/mL (ref 0.35–4.50)

## 2019-11-27 LAB — T4, FREE: Free T4: 0.85 ng/dL (ref 0.60–1.60)

## 2019-11-27 NOTE — Patient Instructions (Signed)
We recommend that you follow these hyperthyroidism instructions at home:  1) Take Methimazole 2.5 mg once a day  If you develop severe sore throat with high fevers OR develop unexplained yellowing of your skin, eyes, under your tongue, severe abdominal pain with nausea or vomiting --> then please get evaluated immediately.    We will send the results of the blood work to the portal. Please make sure you check "mycart " messages  and contact us with any questions.

## 2019-11-28 ENCOUNTER — Encounter: Payer: Self-pay | Admitting: Internal Medicine

## 2019-11-30 DIAGNOSIS — C44329 Squamous cell carcinoma of skin of other parts of face: Secondary | ICD-10-CM | POA: Diagnosis not present

## 2019-12-04 ENCOUNTER — Encounter: Payer: Self-pay | Admitting: Cardiovascular Disease

## 2019-12-04 NOTE — Progress Notes (Signed)
Cardiology Office Note   Date:  12/04/2019   ID:  Valerie [name], DOB 21-Jan-1928, MRN [id number]  PCP:  Chevis Pretty, FNP  Cardiologist:  Dr. Acie Fredrickson    Chief Complaint  Patient presents with  . Hypertension  . Bradycardia     Please note pt was followed by Dr. Mare Ferrari and with his retirement was seen once by Dr. Domenic Polite in [place], but her daughter who is followed by Dr. Acie Fredrickson would like her seen by Dr. Acie Fredrickson.  He has agreed to see pt.  Previous notes from [place]:  Valerie [name] is a 86 y.o. female who presents for HTN, bradycardia and dizziness.  PMH of HTN, HLD, anxiety, depression, GERD, CAD, glaucoma-underwent intervention approximately 3 months ago, lives alone but has family living next door who provide close supervision, presented to [facility] ED on 06/30/16 with complaints of ongoing blurred vision for several weeks post procedure,? Intermittent dyspnea and arm pain. Along with chest pain that she states is arthritic.  She checks her blood pressures regularly at home and apparently were elevated in the 220/95 range despite compliance with medications. She apparently has not felt well for a couple of days, headache, poor oral intake and visual changes since surgery for which she has recently been seen by her ophthalmologist and has follow-up in 2 months. In the ED, CT head without acute abnormalities, initial blood pressure 220/77. Admitted for hypertensive urgency.  Her troponin was negative.  Hx of neg nuc study in 2015.    She had HR to 46 (though looking back she has been slow before)..  Meds adjusted and plan for outpt echo.  She had complained about SOB but was not clear today she does have at times.   Was with hypokalemia and follow up was with K+ 3.3.    Hx of carotid disease with last check in 09/2015 with 1-39% bil stenosis. To be seen by vascular in Dec.   On her last visit she complained of dizziness.  Her HR was low so I stopped cardizem and added amlodipine.   Also on 48 hour holter she was in SR 56 to 60 with occ episodes of sinus brady brief to 42 and occ PACs and PVCs.  Her echo with EF of 55 to 60% G2DD, mild Aortic regurg. LA mildly dialted. PA pk pressure 37 mmHg.   Today she feels better with less palpitations and less dizziness.  She does complain of hair loss.  This began with amlodipine.  She had hair loss with lisinopril as well.  No chest pain and no SOB.    November 26, 2016:  Valerie [name] is seen for the first time today.  Seen with [name] ( sister)  She is a transfer from Dr. Mare Ferrari. She has a history of hypertension and bradycardia.  BP has been up and down.  She takes it at home  BP has been as high as 185 and as low as 125 Goes out to eat once a week .   Does not pay attention to the salt in her food.   Tries to avoid fried foods.    Has rare episodes of palpitations . Perhaps twice a month. Worse when she is lying down  Had a 48 hr monitor  - revealed PACS and PVCs   August 30, 2017:  Seen with Son,  Claiborne Billings  Hx of HTN , blood pressure is high today.  She makes no effort to avoid salt. No cardiac issues  [place] , broke some ribs  Was in the hospital recently for HTN , ( felt some eye pressure )  Still eating some canned foods.   March 09, 2018:  Valerie [name] is seen today for follow-up of her hypertension. Has had some swimmyheadedness - has a cold. Has felt poorly for the past for 3 weeks.  Has had a URI for those 3 weeks No syncope   Dec. 11, 2019:   Has a cold today No syncope No dizziness HR is much better off the metoprolol   Dec. 1, 2020  Has felt well until yesterday  Has not felt well today  No fever, no cough. Has some indigestion  Belched lots yesterday  Has been found to have Graves disease since I last saw her .  Has been hyperthyroid and is on Mithimazole   Hospitalized in May, 2020 with acute diastolic congestive heart failure.  She was found to have atrial fibrillation at that time.  He was started on  Eliquis.  December 05, 2019  Valerie [name] is seen for follow up of her PAF and Graves disease .  Seen with her daughter, Valerie [name]. Has glaucoma - getting shots  Had a skin cancer on her face  Has some chronic stasis changes     Past Medical History:  Diagnosis Date  . Anxiety   . Arthritis   . Broken ribs   . Carotid artery disease ([place])   . Cataract   . Collar bone fracture    Right  . Depression   . Diverticulosis of colon with hemorrhage   . Duodenitis   . Esophageal reflux   . Esophageal stricture   . Essential hypertension   . Glaucoma   . Hiatal hernia   . History of kidney stones     x1   . Hyperlipidemia   . IBS (irritable bowel syndrome)   . Palpitations   . UTI (urinary tract infection) March 2015    Past Surgical History:  Procedure Laterality Date  . CHOLECYSTECTOMY    . COLONOSCOPY    . DILATION AND CURETTAGE OF UTERUS    . EYE SURGERY Bilateral    total of 5  . LASER PHOTO ABLATION Right 10/20/2016   Procedure: LASER PHOTO ABLATION;  Surgeon: Hayden Pedro, MD;  Location: [facility];  Service: Ophthalmology;  Laterality: Right;  Headscope laser  . LEFT ROTATOR CUFF REPAIR X2 Left   . PARS PLANA VITRECTOMY  10/20/2016   WITH 25G REMOVAL/SUTURE SECONDARY INTRAOCULAR LENS, GAS FLUID EXCHANGE   . PARS PLANA VITRECTOMY Right 10/20/2016   Procedure: PARS PLANA VITRECTOMY WITH 25G REMOVAL/SUTURE SECONDARY INTRAOCULAR LENS, GAS FLUID EXCHANGE;  Surgeon: Hayden Pedro, MD;  Location: [facility];  Service: Ophthalmology;  Laterality: Right;  . RIGHT ROTATOR CUFF REPAIR X1 Right   . UPPER GASTROINTESTINAL ENDOSCOPY     with dilation     Current Outpatient Medications  Medication Sig Dispense Refill  . acetaminophen (TYLENOL) 325 MG tablet Take 650 mg by mouth every 6 (six) hours as needed for headache (pain).    Marland Kitchen amLODipine (NORVASC) 5 MG tablet TAKE 1 TABLET ONCE A DAY 90 tablet 2  . apixaban (ELIQUIS) 2.5 MG TABS tablet Take 1 tablet (2.5 mg total) by mouth 2  (two) times daily. 60 tablet 5  . carvedilol (COREG) 3.125 MG tablet Take 1 tablet (3.125 mg total) by mouth 2 (two) times daily with a meal. 180 tablet 1  . fluticasone (FLONASE) 50 MCG/ACT nasal spray  Place 2 sprays into both nostrils as needed for allergies or rhinitis. 16 g 1  . methimazole (TAPAZOLE) 5 MG tablet TAKE (1/2) TABLET DAILY. 30 tablet 0  . nitroGLYCERIN (NITROSTAT) 0.4 MG SL tablet Place 1 tablet (0.4 mg total) under the tongue every 5 (five) minutes as needed for chest pain. 25 tablet 3  . pantoprazole (PROTONIX) 40 MG tablet Take 1 tablet (40 mg total) by mouth daily. 30 tablet 3  . pravastatin (PRAVACHOL) 10 MG tablet TAKE 1 TABLET ONCE A DAY 90 tablet 3  . Vitamin D, Ergocalciferol, (DRISDOL) 1.25 MG (50000 UNIT) CAPS capsule Take 1 capsule (50,000 Units total) by mouth every 7 (seven) days. 12 capsule 0   No current facility-administered medications for this visit.    Allergies:   Aricept [donepezil hcl], Lisinopril, Sulfa antibiotics, and Sulfasalazine    Social History:  The patient  reports that she has never smoked. She has never used smokeless tobacco. She reports that she does not drink alcohol or use drugs.   Family History:  The patient's family history includes Bladder Cancer in her brother; Colon cancer (age of onset: 63) in her father; Kidney cancer in her brother; Leukemia in her mother; Stroke in her sister.    Review of systems:    As per current history.  Otherwise the review of systems is negative.   Physical Exam: There were no vitals taken for this visit.  GEN:  Well nourished, well developed in no acute distress HEENT: Normal NECK: No JVD; No carotid bruits LYMPHATICS: No lymphadenopathy CARDIAC: RRR , soft systolic murmur  RESPIRATORY:  Clear to auscultation without rales, wheezing or rhonchi  ABDOMEN: Soft, non-tender, non-distended MUSCULOSKELETAL:  No edema; No deformity  SKIN: Warm and dry NEUROLOGIC:  Alert and oriented x 3    EKG:       Recent Labs: 02/17/2019: ALT 16; B Natriuretic Peptide 914.9 02/19/2019: Magnesium 1.6 03/27/2019: NT-Pro BNP 335 08/29/2019: BUN 21; Creatinine, Ser 0.89; Hemoglobin 13.5; Platelets 336; Potassium 4.7; Sodium 141 11/27/2019: TSH 2.13   Lipid Panel    Component Value Date/Time   CHOL 186 03/09/2018 1022   TRIG 156 (H) 03/09/2018 1022   HDL 51 03/09/2018 1022   CHOLHDL 3.6 03/09/2018 1022   CHOLHDL 4 12/05/2014 0946   VLDL 26.0 12/05/2014 0946   LDLCALC 104 (H) 03/09/2018 1022     Other studies Reviewed: Additional studies/ records that were reviewed today include:  Echo 07/31/16 Study Conclusions  - Left ventricle: The cavity size was normal. There was mild focal   basal hypertrophy of the septum. Systolic function was normal.   The estimated ejection fraction was in the range of 55% to 60%.   Wall motion was normal; there were no regional wall motion   abnormalities. Features are consistent with a pseudonormal left   ventricular filling pattern, with concomitant abnormal relaxation   and increased filling pressure (grade 2 diastolic dysfunction). - Aortic valve: There was mild regurgitation. - Mitral valve: Calcified annulus. There was mild regurgitation. - Left atrium: The atrium was mildly dilated. - Pulmonary arteries: Systolic pressure was mildly increased. PA   peak pressure: 37 mm Hg (S).   ASSESSMENT AND PLAN:  1.  HTN:     BP is doing well.  Cont meds   2.  Paroxysmal atrial fibrillation:   Cont eliquis 2.5 bID   3. Aortic Insufficiency :   Moderate - severe AI .  No real symptoms.  She also be 86 years  old.  We will continue to follow her but I do not anticipate that she will ever need aortic valve surgery.  4. Hyperlipidemia: She is on Pravachol 10 mg a day.  Will check liver enzymes and  lipids today.  4. Carotid disease:    Had mild carotid artery disease back in 2019.  She does not want to follow-up with further carotid artery scans.  At age 34 I am  inclined to not refer her to the vascular surgeons unless she has significant worsening of her carotids on exam or has neurologic symptoms.  5.  Generalized fatigue.    Current medicines are reviewed with the patient today.  The patient Has no concerns regarding medicines.  The following changes have been made:  See above Labs/ tests ordered today include:see above    Signed, Mertie Moores, MD  12/04/2019 8:42 PM    [address], [place], [place] Phone: [phone]; Fax: [phone]

## 2019-12-05 ENCOUNTER — Encounter: Payer: Self-pay | Admitting: Cardiovascular Disease

## 2019-12-05 ENCOUNTER — Ambulatory Visit (INDEPENDENT_AMBULATORY_CARE_PROVIDER_SITE_OTHER): Payer: Medicare PPO | Admitting: Cardiovascular Disease

## 2019-12-05 ENCOUNTER — Other Ambulatory Visit: Payer: Self-pay

## 2019-12-05 VITALS — BP 118/62 | HR 61 | Ht 64.0 in | Wt 107.8 lb

## 2019-12-05 DIAGNOSIS — I6523 Occlusion and stenosis of bilateral carotid arteries: Secondary | ICD-10-CM | POA: Diagnosis not present

## 2019-12-05 DIAGNOSIS — E782 Mixed hyperlipidemia: Secondary | ICD-10-CM

## 2019-12-05 DIAGNOSIS — I351 Nonrheumatic aortic (valve) insufficiency: Secondary | ICD-10-CM | POA: Diagnosis not present

## 2019-12-05 DIAGNOSIS — I480 Paroxysmal atrial fibrillation: Secondary | ICD-10-CM

## 2019-12-05 DIAGNOSIS — I10 Essential (primary) hypertension: Secondary | ICD-10-CM | POA: Diagnosis not present

## 2019-12-05 LAB — HEPATIC FUNCTION PANEL
ALT: 13 IU/L (ref 0–32)
AST: 17 IU/L (ref 0–40)
Albumin: 4 g/dL (ref 3.6–4.6)
Alkaline Phosphatase: 86 IU/L (ref 39–117)
Bilirubin Total: 0.4 mg/dL (ref 0.0–1.2)
Bilirubin, Direct: 0.12 mg/dL (ref 0.00–0.40)
Total Protein: 6.6 g/dL (ref 6.0–8.5)

## 2019-12-05 LAB — LIPID PANEL
Chol/HDL Ratio: 3.1 ratio (ref 0.0–4.4)
Cholesterol, Total: 205 mg/dL — ABNORMAL HIGH (ref 100–199)
HDL: 66 mg/dL (ref >39)
LDL Chol Calc (NIH): 118 mg/dL — ABNORMAL HIGH (ref 0–99)
Triglycerides: 119 mg/dL (ref 0–149)
VLDL Cholesterol Cal: 21 mg/dL (ref 5–40)

## 2019-12-05 NOTE — Patient Instructions (Signed)
Medication Instructions:  Your physician recommends that you continue on your current medications as directed. Please refer to the Current Medication list given to you today.  *If you need a refill on your cardiac medications before your next appointment, please call your pharmacy*   Lab Work: TODAY - liver panel, cholesterol If you have labs (blood work) drawn today and your tests are completely normal, you will receive your results only by: Marland Kitchen MyChart Message (if you have MyChart) OR . A paper copy in the mail If you have any lab test that is abnormal or we need to change your treatment, we will call you to review the results.   Testing/Procedures: None Ordered   Follow-Up: At [facility], you and your health needs are our priority.  As part of our continuing mission to provide you with exceptional heart care, we have created designated Provider Care Teams.  These Care Teams include your primary Cardiologist (physician) and Advanced Practice Providers (APPs -  Physician Assistants and Nurse Practitioners) who all work together to provide you with the care you need, when you need it.  We recommend signing up for the patient portal called "MyChart".  Sign up information is provided on this After Visit Summary.  MyChart is used to connect with patients for Virtual Visits (Telemedicine).  Patients are able to view lab/test results, encounter notes, upcoming appointments, etc.  Non-urgent messages can be sent to your provider as well.   To learn more about what you can do with MyChart, go to [email or web address].    Your next appointment:   1 year(s)  The format for your next appointment:   In Person  Provider:   You may see Mertie Moores, MD or one of the following Advanced Practice Providers on your designated Care Team:    Richardson Dopp, PA-C  [place], [place]  Daune Perch, [place]

## 2019-12-08 ENCOUNTER — Other Ambulatory Visit: Payer: Self-pay | Admitting: Internal Medicine

## 2019-12-21 ENCOUNTER — Telehealth: Payer: Self-pay | Admitting: Nurse Practitioner

## 2019-12-21 NOTE — Telephone Encounter (Signed)
Expressed the importance of being seen asap if any chance it is a blood clot. Still wants to wait till tomorrow

## 2019-12-22 ENCOUNTER — Encounter: Payer: Self-pay | Admitting: Nurse Practitioner

## 2019-12-22 ENCOUNTER — Ambulatory Visit (INDEPENDENT_AMBULATORY_CARE_PROVIDER_SITE_OTHER): Payer: Medicare PPO | Admitting: Nurse Practitioner

## 2019-12-22 ENCOUNTER — Other Ambulatory Visit: Payer: Self-pay

## 2019-12-22 VITALS — BP 154/66 | HR 59 | Temp 96.8°F | Resp 20 | Ht 64.0 in | Wt 108.0 lb

## 2019-12-22 DIAGNOSIS — M79604 Pain in right leg: Secondary | ICD-10-CM

## 2019-12-22 DIAGNOSIS — R300 Dysuria: Secondary | ICD-10-CM | POA: Diagnosis not present

## 2019-12-22 DIAGNOSIS — M79605 Pain in left leg: Secondary | ICD-10-CM

## 2019-12-22 LAB — URINALYSIS, COMPLETE
Bilirubin, UA: NEGATIVE
Glucose, UA: NEGATIVE
Nitrite, UA: NEGATIVE
RBC, UA: NEGATIVE
Specific Gravity, UA: 1.02 (ref 1.005–1.030)
Urobilinogen, Ur: 0.2 mg/dL (ref 0.2–1.0)
pH, UA: 5.5 (ref 5.0–7.5)

## 2019-12-22 LAB — MICROSCOPIC EXAMINATION

## 2019-12-22 MED ORDER — DICLOFENAC SODIUM 1 % EX GEL: 2.0000 g | Freq: Four times a day (QID) | CUTANEOUS | 1 refills | Status: DC

## 2019-12-22 NOTE — Patient Instructions (Signed)
Varicose Veins Varicose veins are veins that have become enlarged, bulged, and twisted. They most often appear in the legs. What are the causes? This condition is caused by damage to the valves in the vein. These valves help blood return to your heart. When they are damaged and they stop working properly, blood may flow backward and back up in the veins near the skin, causing the veins to get larger and appear twisted. The condition can result from any issue that causes blood to back up, like pregnancy, prolonged standing, or obesity. What increases the risk? This condition is more likely to develop in people who are:  On their feet a lot.  Pregnant.  Overweight. What are the signs or symptoms? Symptoms of this condition include:  Bulging, twisted, and bluish veins.  A feeling of heaviness. This may be worse at the end of the day.  Leg pain. This may be worse at the end of the day.  Swelling in the leg.  Changes in skin color over the veins. How is this diagnosed? This condition may be diagnosed based on your symptoms, a physical exam, and an ultrasound test. How is this treated? Treatment for this condition may involve:  Avoiding sitting or standing in one position for long periods of time.  Wearing compression stockings. These stockings help to prevent blood clots and reduce swelling in the legs.  Raising (elevating) the legs when resting.  Losing weight.  Exercising regularly. If you have persistent symptoms or want to improve the way your varicose veins look, you may choose to have a procedure to close the varicose veins off or to remove them. Treatments to close off the veins include:  Sclerotherapy. In this treatment, a solution is injected into a vein to close it off.  Laser treatment. In this treatment, the vein is heated with a laser to close it off.  Radiofrequency vein ablation. In this treatment, an electrical current produced by radio waves is used to close  off the vein. Treatments to remove the veins include:  Phlebectomy. In this treatment, the veins are removed through small incisions made over the veins.  Vein ligation and stripping. In this treatment, incisions are made over the veins. The veins are then removed after being tied (ligated) with stitches (sutures). Follow these instructions at home: Activity  Walk as much as possible. Walking increases blood flow. This helps blood return to the heart and takes pressure off your veins. It also increases your cardiovascular strength.  Follow your health care provider's instructions about exercising.  Do not stand or sit in one position for a long period of time.  Do not sit with your legs crossed.  Rest with your legs raised during the day. General instructions   Follow any diet instructions given to you by your health care provider.  Wear compression stockings as directed by your health care provider. Do not wear other kinds of tight clothing around your legs, pelvis, or waist.  Elevate your legs at night to above the level of your heart.  If you get a cut in the skin over the varicose vein and the vein bleeds: ? Lie down with your leg raised. ? Apply firm pressure to the cut with a clean cloth until the bleeding stops. ? Place a bandage (dressing) on the cut. Contact a health care provider if:  The skin around your varicose veins starts to break down.  You have pain, redness, tenderness, or hard swelling over a vein.  You   are uncomfortable because of pain.  You get a cut in the skin over a varicose vein and it will not stop bleeding. Summary  Varicose veins are veins that have become enlarged, bulged, and twisted. They most often appear in the legs.  This condition is caused by damage to the valves in the vein. These valves help blood return to your heart.  Treatment for this condition includes frequent movements, wearing compression stockings, losing weight, and  exercising regularly. In some cases, procedures are done to close off or remove the veins.  Treatment for this condition may include wearing compression stockings, elevating the legs, losing weight, and engaging in regular activity. In some cases, procedures are done to close off or remove the veins. This information is not intended to replace advice given to you by your health care provider. Make sure you discuss any questions you have with your health care provider. Document Revised: 11/10/2018 Document Reviewed: 10/07/2016 Elsevier Patient Education  2020 [employer]  

## 2019-12-22 NOTE — Progress Notes (Signed)
   Subjective:    Patient ID: Valerie [name], female    DOB: 12/25/1927, 86 y.o.   MRN: [id number]   Chief Complaint: Bilateral leg pain   HPI Patient is brought in by her friend [name] c/o: - bil leg pain. acutually says her legs are sore to the touch. Right leg it is above the ankle medially and left leg is medially midway up lower leg. Kari Baars are a little swollen in mornings but resolve at night.to much activity will cause pain. - suprpubic pain fro 2 days- wants urine checked. Does have frequency but has been that way for years   Review of Systems  Constitutional: Negative for diaphoresis.  Eyes: Negative for pain.  Respiratory: Negative for shortness of breath.   Cardiovascular: Negative for chest pain, palpitations and leg swelling.  Gastrointestinal: Negative for abdominal pain.  Endocrine: Negative for polydipsia.  Skin: Negative for rash.  Neurological: Negative for dizziness, weakness and headaches.  Hematological: Does not bruise/bleed easily.  All other systems reviewed and are negative.      Objective:   Physical Exam Vitals and nursing note reviewed.  Constitutional:      Appearance: Normal appearance.  Cardiovascular:     Rate and Rhythm: Normal rate and regular rhythm.     Pulses:          Posterior tibial pulses are 2+ on the right side and 2+ on the left side.     Heart sounds: Normal heart sounds.     Comments: negative homan sign bil lower ext Pulmonary:     Breath sounds: Normal breath sounds.  Skin:    General: Skin is warm.     Comments: circulatory skin changes bil lower ext. Medially superior to right ankle is sore to touch but no erythema, and is cool to touch. Medially mid lower left leg tender to touc with brownish discoloration if skin. No edema and cool to touch.  Neurological:     General: No focal deficit present.     Mental Status: She is alert.  Psychiatric:        Mood and Affect: Mood normal.        Behavior: Behavior normal.    BP  (!) 154/66   Pulse (!) 59   Temp (!) 96.8 F (36 C) (Temporal)   Resp 20   Ht 5\' 4"  (1.626 m)   Wt 108 lb (49 kg)   SpO2 97%   BMI 18.54 kg/m         Assessment & Plan:  Valerie [name] in today with chief complaint of Bilateral leg pain   1. Dysuria Cranberry juice Will run culture on urine and see if grows anything Dip was not concerning for needing antibiotic - Urinalysis, Complete - Urine Culture  2. Pain in both lower extremities probably due to varicose veins Wear compression hose - diclofenac Sodium (VOLTAREN) 1 % GEL; Apply 2 g topically 4 (four) times daily.  Dispense: 150 g; Refill: 1    The above assessment and management plan was discussed with the patient. The patient verbalized understanding of and has agreed to the management plan. Patient is aware to call the clinic if symptoms persist or worsen. Patient is aware when to return to the clinic for a follow-up visit. Patient educated on when it is appropriate to go to the emergency department.   Mary-Margaret Hassell Done, FNP

## 2019-12-24 LAB — URINE CULTURE

## 2019-12-27 ENCOUNTER — Other Ambulatory Visit: Payer: Self-pay | Admitting: Cardiovascular Disease

## 2019-12-27 NOTE — Telephone Encounter (Signed)
Last OV 12/05/19 86 years old 49 kg Scr 0.89 ok 08/29/19 Eliquis 2.5mg  sent to pharmacy

## 2020-01-08 ENCOUNTER — Other Ambulatory Visit: Payer: Self-pay | Admitting: Nurse Practitioner

## 2020-01-08 DIAGNOSIS — K2900 Acute gastritis without bleeding: Secondary | ICD-10-CM

## 2020-01-15 DIAGNOSIS — Z85828 Personal history of other malignant neoplasm of skin: Secondary | ICD-10-CM | POA: Diagnosis not present

## 2020-01-15 DIAGNOSIS — L821 Other seborrheic keratosis: Secondary | ICD-10-CM | POA: Diagnosis not present

## 2020-01-15 DIAGNOSIS — L570 Actinic keratosis: Secondary | ICD-10-CM | POA: Diagnosis not present

## 2020-02-07 ENCOUNTER — Other Ambulatory Visit: Payer: Self-pay | Admitting: Nurse Practitioner

## 2020-02-07 DIAGNOSIS — H35353 Cystoid macular degeneration, bilateral: Secondary | ICD-10-CM | POA: Diagnosis not present

## 2020-02-20 ENCOUNTER — Other Ambulatory Visit: Payer: Self-pay | Admitting: Cardiovascular Disease

## 2020-02-21 ENCOUNTER — Telehealth: Payer: Self-pay | Admitting: Cardiovascular Disease

## 2020-02-21 NOTE — Telephone Encounter (Signed)
   Went to chart to check refill request.   pravastatin    was sent yesterday, pt's daughter understood

## 2020-04-09 ENCOUNTER — Other Ambulatory Visit: Payer: Self-pay | Admitting: Cardiovascular Disease

## 2020-04-09 DIAGNOSIS — R5383 Other fatigue: Secondary | ICD-10-CM

## 2020-04-09 IMAGING — CR CHEST - 2 VIEW
2 series · 2 of 2 positions shown · non-contrast
Comparison: 01/13/2019, 11/27/2018

CLINICAL DATA: 89-year-old female with shortness of breath for 1
week

EXAM:
CHEST - 2 VIEW

[chest lat]
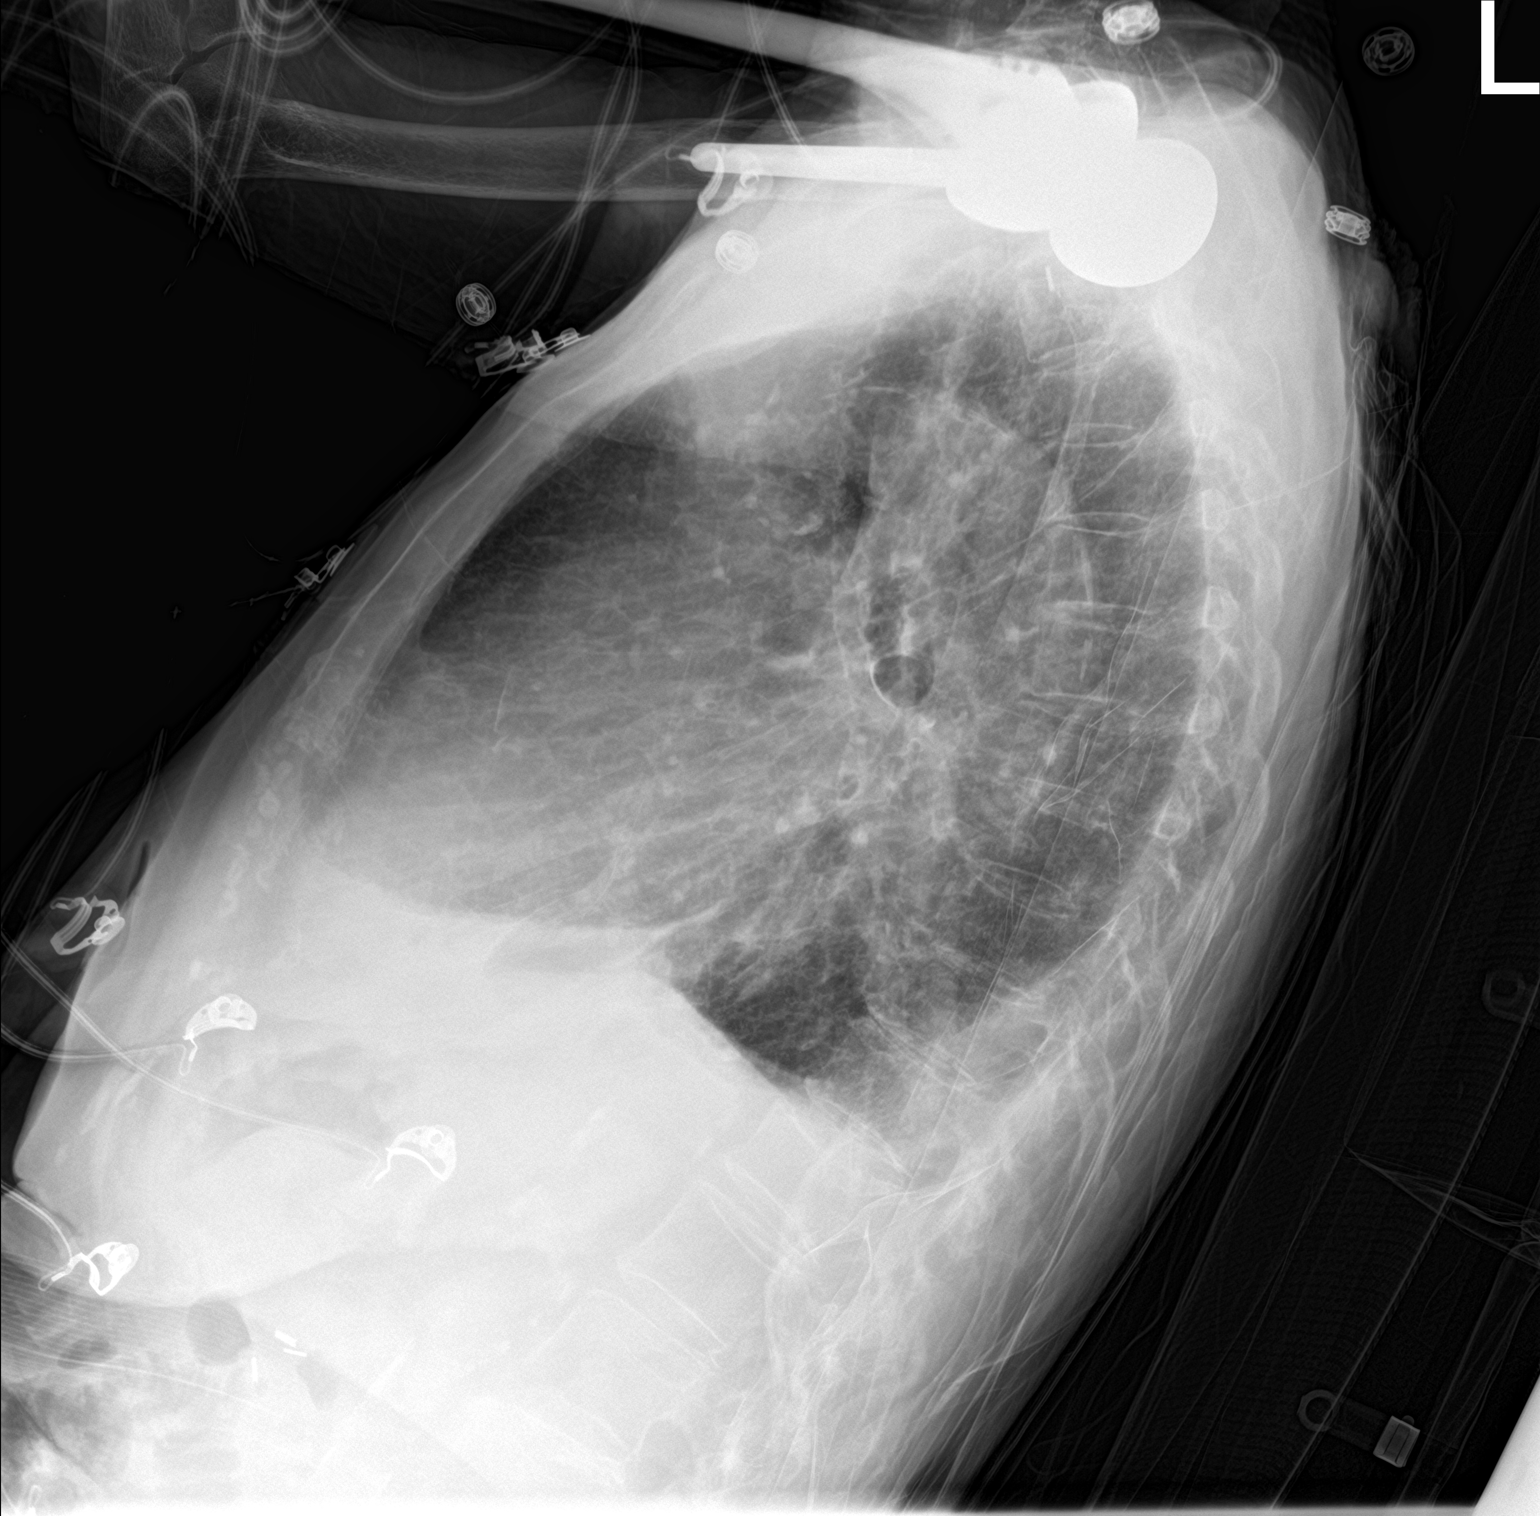

[chest ap]
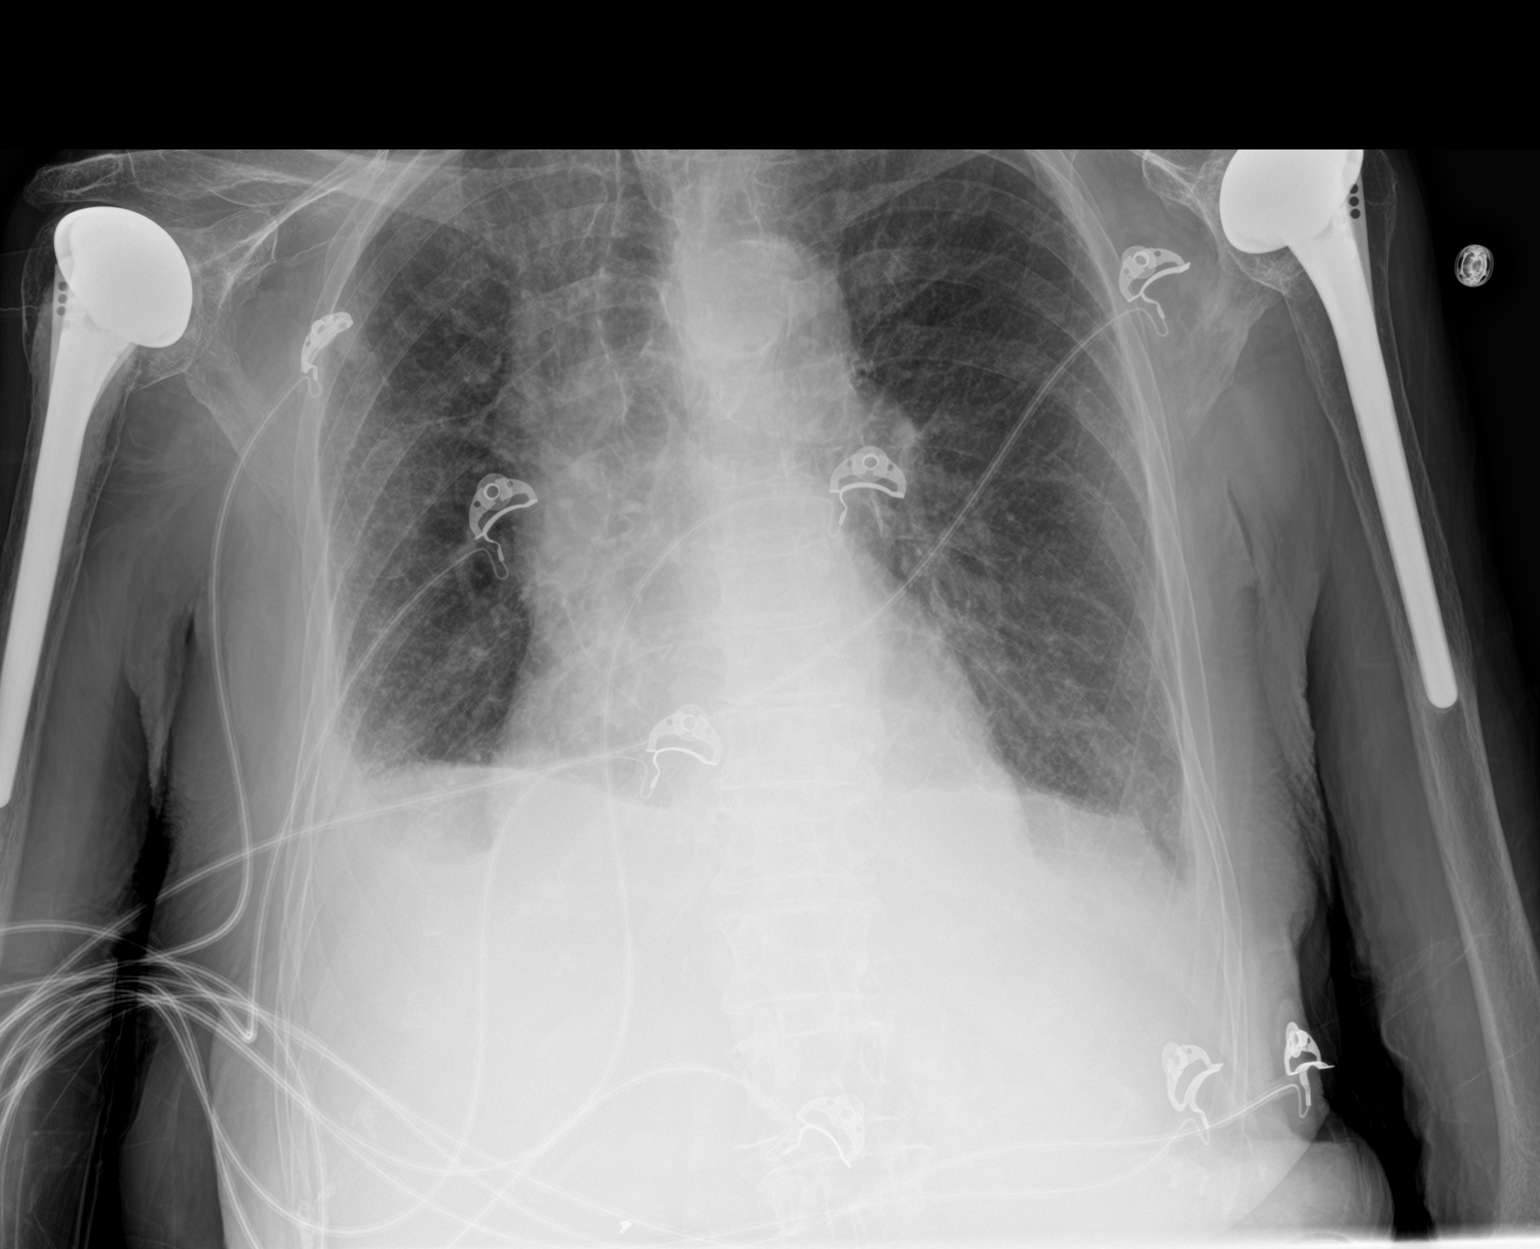

[2 of 2 positions shown; findings below may reference images not displayed]

FINDINGS: Cardiomediastinal silhouette unchanged.

Interval development of bibasilar opacities with blunting of the
costophrenic angles and meniscus on the lateral view.

Interlobular septal thickening. No pneumothorax. Coarsened
interstitial markings persist.

Osteopenia with degenerative changes of the spine.

No displaced fracture. Surgical changes of bilateral shoulder
arthroplasty
IMPRESSION: Congestive heart failure with small bilateral pleural effusions.

## 2020-04-20 DIAGNOSIS — L03119 Cellulitis of unspecified part of limb: Secondary | ICD-10-CM | POA: Diagnosis not present

## 2020-04-20 DIAGNOSIS — S80812A Abrasion, left lower leg, initial encounter: Secondary | ICD-10-CM | POA: Diagnosis not present

## 2020-04-20 DIAGNOSIS — Z23 Encounter for immunization: Secondary | ICD-10-CM | POA: Diagnosis not present

## 2020-04-23 DIAGNOSIS — B351 Tinea unguium: Secondary | ICD-10-CM | POA: Diagnosis not present

## 2020-04-23 DIAGNOSIS — L84 Corns and callosities: Secondary | ICD-10-CM | POA: Diagnosis not present

## 2020-04-23 DIAGNOSIS — I70203 Unspecified atherosclerosis of native arteries of extremities, bilateral legs: Secondary | ICD-10-CM | POA: Diagnosis not present

## 2020-04-23 DIAGNOSIS — M79676 Pain in unspecified toe(s): Secondary | ICD-10-CM | POA: Diagnosis not present

## 2020-04-29 ENCOUNTER — Other Ambulatory Visit: Payer: Self-pay

## 2020-04-29 ENCOUNTER — Ambulatory Visit: Payer: Medicare PPO | Admitting: Family Medicine

## 2020-04-29 ENCOUNTER — Encounter: Payer: Self-pay | Admitting: Family Medicine

## 2020-04-29 VITALS — BP 149/68 | HR 61 | Temp 98.0°F | Ht 64.0 in | Wt 114.0 lb

## 2020-04-29 DIAGNOSIS — S81801D Unspecified open wound, right lower leg, subsequent encounter: Secondary | ICD-10-CM

## 2020-04-29 DIAGNOSIS — S81802A Unspecified open wound, left lower leg, initial encounter: Secondary | ICD-10-CM | POA: Diagnosis not present

## 2020-04-29 DIAGNOSIS — S81802D Unspecified open wound, left lower leg, subsequent encounter: Secondary | ICD-10-CM

## 2020-04-29 DIAGNOSIS — W19XXXD Unspecified fall, subsequent encounter: Secondary | ICD-10-CM | POA: Diagnosis not present

## 2020-04-29 DIAGNOSIS — S81801A Unspecified open wound, right lower leg, initial encounter: Secondary | ICD-10-CM | POA: Diagnosis not present

## 2020-04-29 NOTE — Progress Notes (Signed)
BP (!) 149/68   Pulse 61   Temp 98 F (36.7 C)   Ht 5\' 4"  (1.626 m)   Wt 114 lb (51.7 kg)   SpO2 99%   BMI 19.57 kg/m    Subjective:   Patient ID: Valerie [name], female    DOB: [birth year], 86 y.o.   MRN: [id number]  HPI: Valerie [name] is a 86 y.o. female presenting on 04/29/2020 for Wound Check (Bilateral lower extremities)   HPI Patient is coming in for wound care recheck, she had a fall 2 weeks ago, she was carrying something and fell down the stairs.  Since that time she was seen in the urgent care and given an antibiotic and this was about a week ago and then was instructed to do the wet-to-dry dressings and has been changing those daily. Patient denies any fevers or chills or drainage or redness or warmth.  Her son is here with her and has been doing the dressing care changes.  Relevant past medical, surgical, family and social history reviewed and updated as indicated. Interim medical history since our last visit reviewed. Allergies and medications reviewed and updated.  Review of Systems  Constitutional: Negative for chills and fever.  Respiratory: Negative for chest tightness and shortness of breath.   Cardiovascular: Negative for chest pain and leg swelling.  Skin: Positive for wound. Negative for color change and rash.  Neurological: Negative for light-headedness.  All other systems reviewed and are negative.   Per HPI unless specifically indicated above   Allergies as of 04/29/2020      Reactions   Aricept [donepezil Hcl] Other (See Comments)   Nightmares, near syncope, weak, decreased appetite.   Lisinopril Other (See Comments)   Hair loss   Sulfa Antibiotics Nausea Only   Sulfasalazine Nausea Only      Medication List       Accurate as of April 29, 2020  3:47 PM. If you have any questions, ask your nurse or doctor.        acetaminophen 325 MG tablet Commonly known as: TYLENOL Take 650 mg by mouth every 6 (six) hours as needed for headache (pain).    amLODipine 5 MG tablet Commonly known as: NORVASC TAKE 1 TABLET ONCE A DAY   apixaban 2.5 MG Tabs tablet Commonly known as: Eliquis Take 1 tablet (2.5 mg total) by mouth 2 (two) times daily.   carvedilol 3.125 MG tablet Commonly known as: COREG TAKE  (1)  TABLET TWICE A DAY WITH MEALS (BREAKFAST AND SUPPER)   cephALEXin 500 MG capsule Commonly known as: KEFLEX Take 500 mg by mouth 2 (two) times daily.   diclofenac Sodium 1 % Gel Commonly known as: Voltaren Apply 2 g topically 4 (four) times daily.   fluticasone 50 MCG/ACT nasal spray Commonly known as: FLONASE Place 2 sprays into both nostrils as needed for allergies or rhinitis.   methimazole 5 MG tablet Commonly known as: TAPAZOLE TAKE (1/2) TABLET DAILY.   mupirocin ointment 2 % Commonly known as: BACTROBAN Apply 3 application topically as needed.   nitroGLYCERIN 0.4 MG SL tablet Commonly known as: NITROSTAT Place 1 tablet (0.4 mg total) under the tongue every 5 (five) minutes as needed for chest pain.   pantoprazole 40 MG tablet Commonly known as: PROTONIX TAKE 1 TABLET DAILY   pravastatin 10 MG tablet Commonly known as: PRAVACHOL TAKE 1 TABLET ONCE A DAY   silver sulfADIAZINE 1 % cream Commonly known as: SILVADENE Apply 1 application topically  as directed.   Vitamin D (Ergocalciferol) 1.25 MG (50000 UNIT) Caps capsule Commonly known as: DRISDOL Take 1 capsule (50,000 Units total) by mouth every 7 (seven) days.        Objective:   BP (!) 149/68   Pulse 61   Temp 98 F (36.7 C)   Ht 5\' 4"  (1.626 m)   Wt 114 lb (51.7 kg)   SpO2 99%   BMI 19.57 kg/m   Wt Readings from Last 3 Encounters:  04/29/20 114 lb (51.7 kg)  12/22/19 108 lb (49 kg)  12/05/19 107 lb 12 oz (48.9 kg)    Physical Exam Vitals and nursing note reviewed.  Constitutional:      Appearance: Normal appearance.  Skin:      Neurological:     Mental Status: She is alert.     Patient has been doing wet-to-dry dressings  using Xeroform gauze and then normal gauze and then pressure dressing, continue forward with this and change daily.  Follow-up in 2 weeks  Assessment & Plan:   Problem List Items Addressed This Visit    None    Visit Diagnoses    Fall, subsequent encounter    -  Primary   Leg wound, right, subsequent encounter       Wound of left lower extremity, subsequent encounter           Follow up plan: Return if symptoms worsen or fail to improve, for 1 to 2-week wound care recheck.  Counseling provided for all of the vaccine components No orders of the defined types were placed in this encounter.   Caryl Pina, MD [facility] 04/29/2020, 3:47 PM

## 2020-05-06 DIAGNOSIS — H35353 Cystoid macular degeneration, bilateral: Secondary | ICD-10-CM | POA: Diagnosis not present

## 2020-05-08 ENCOUNTER — Other Ambulatory Visit: Payer: Self-pay | Admitting: Cardiovascular Disease

## 2020-05-08 ENCOUNTER — Other Ambulatory Visit: Payer: Self-pay | Admitting: Nurse Practitioner

## 2020-05-08 DIAGNOSIS — K2900 Acute gastritis without bleeding: Secondary | ICD-10-CM

## 2020-05-09 ENCOUNTER — Telehealth: Payer: Medicare PPO | Admitting: Emergency Medicine

## 2020-05-09 DIAGNOSIS — J019 Acute sinusitis, unspecified: Secondary | ICD-10-CM | POA: Diagnosis not present

## 2020-05-09 MED ORDER — AZELASTINE HCL 0.15 % NA SOLN
2.0000 | Freq: Two times a day (BID) | NASAL | 0 refills | Status: DC
Start: 2020-05-09 — End: 2020-11-21

## 2020-05-09 MED ORDER — DOXYCYCLINE HYCLATE 100 MG PO CAPS
100.0000 mg | ORAL_CAPSULE | Freq: Two times a day (BID) | ORAL | 0 refills | Status: DC
Start: 2020-05-09 — End: 2020-05-14

## 2020-05-09 NOTE — Progress Notes (Signed)
We are sorry that you are not feeling well.  Here is how we plan to help!  Based on what you have shared with me it looks like you have sinusitis.  Sinusitis is inflammation and infection in the sinus cavities of the head.  Based on your presentation I believe you most likely have Acute Bacterial Sinusitis.  This is an infection caused by bacteria and is treated with antibiotics. I have prescribed Doxycycline 100mg  by mouth twice a day for 10 days. I have also prescribed Azelastine Nasal spray once in each nostril twice a day. You may use an oral decongestant such as Mucinex D or if you have glaucoma or high blood pressure use plain Mucinex. Saline nasal spray help and can safely be used as often as needed for congestion.  If you develop worsening sinus pain, fever or notice severe headache and vision changes, or if symptoms are not better after completion of antibiotic, please schedule an appointment with a health care provider.    Sinus infections are not as easily transmitted as other respiratory infection, however we still recommend that you avoid close contact with loved ones, especially the very young and elderly.  Remember to wash your hands thoroughly throughout the day as this is the number one way to prevent the spread of infection!  Home Care:  Only take medications as instructed by your medical team.  Complete the entire course of an antibiotic.  Do not take these medications with alcohol.  A steam or ultrasonic humidifier can help congestion.  You can place a towel over your head and breathe in the steam from hot water coming from a faucet.  Avoid close contacts especially the very young and the elderly.  Cover your mouth when you cough or sneeze.  Always remember to wash your hands.  Get Help Right Away If:  You develop worsening fever or sinus pain.  You develop a severe head ache or visual changes.  Your symptoms persist after you have completed your treatment  plan.  Make sure you  Understand these instructions.  Will watch your condition.  Will get help right away if you are not doing well or get worse.  Your e-visit answers were reviewed by a board certified advanced clinical practitioner to complete your personal care plan.  Depending on the condition, your plan could have included both over the counter or prescription medications.  If there is a problem please reply  once you have received a response from your provider.  Your safety is important to [place].  If you have drug allergies check your prescription carefully.    You can use MyChart to ask questions about today's visit, request a non-urgent call back, or ask for a work or school excuse for 24 hours related to this e-Visit. If it has been greater than 24 hours you will need to follow up with your provider, or enter a new e-Visit to address those concerns.  You will get an e-mail in the next two days asking about your experience.  I hope that your e-visit has been valuable and will speed your recovery. Thank you for using e-visits.   **Please do not respond to this message unless you have follow up questions.** Greater than 5 but less than 10 minutes spent researching, coordinating, and implementing care for this patient today

## 2020-05-14 ENCOUNTER — Other Ambulatory Visit: Payer: Self-pay

## 2020-05-14 ENCOUNTER — Ambulatory Visit: Payer: Medicare PPO | Admitting: Family Medicine

## 2020-05-14 ENCOUNTER — Encounter: Payer: Self-pay | Admitting: Family Medicine

## 2020-05-14 VITALS — BP 152/70 | HR 61 | Temp 98.3°F | Ht 64.0 in | Wt 116.0 lb

## 2020-05-14 DIAGNOSIS — S81801D Unspecified open wound, right lower leg, subsequent encounter: Secondary | ICD-10-CM | POA: Diagnosis not present

## 2020-05-14 NOTE — Progress Notes (Signed)
   BP (!) 152/70   Pulse 61   Temp 98.3 F (36.8 C)   Ht 5\' 4"  (1.626 m)   Wt 116 lb (52.6 kg)   SpO2 96%   BMI 19.91 kg/m    Subjective:   Patient ID: Valerie [name], female    DOB: 1927-11-24, 86 y.o.   MRN: [id number]  HPI: Valerie [name] is a 86 y.o. female presenting on 05/14/2020 for Wound Check   HPI Wound care recheck Patient is coming in for bilateral leg wound check, though 1 in the left leg is very small and the one on the right shin is tolerating skin year and both seem to be healing and getting smaller. She has been doing daily wound care changes with Xeroform gauze topically.  Relevant past medical, surgical, family and social history reviewed and updated as indicated. Interim medical history since our last visit reviewed. Allergies and medications reviewed and updated.  Review of Systems  Constitutional: Negative for chills and fever.  Eyes: Negative for visual disturbance.  Skin: Positive for wound. Negative for color change and rash.  Neurological: Negative for light-headedness and headaches.  Psychiatric/Behavioral: Negative for agitation and behavioral problems.  All other systems reviewed and are negative.   Per HPI unless specifically indicated above      Objective:   BP (!) 152/70   Pulse 61   Temp 98.3 F (36.8 C)   Ht 5\' 4"  (1.626 m)   Wt 116 lb (52.6 kg)   SpO2 96%   BMI 19.91 kg/m   Wt Readings from Last 3 Encounters:  05/14/20 116 lb (52.6 kg)  04/29/20 114 lb (51.7 kg)  12/22/19 108 lb (49 kg)    Physical Exam Vitals and nursing note reviewed.  Skin:           Assessment & Plan:   Problem List Items Addressed This Visit    None    Visit Diagnoses    Leg wound, right, subsequent encounter    -  Primary    Patient's wound appears to be healing a lot better, down now at half his wine, has good red-pink granulation tissue in most of it, little bit yellow near the top. Other lesion on left leg is less than 1.1 cm and will let  heal and keep open, continue to clean daily and change wounds daily  Follow up plan: Return if symptoms worsen or fail to improve, for 2 to 3-week wound recheck.  Counseling provided for all of the vaccine components No orders of the defined types were placed in this encounter.   Caryl Pina, MD [facility] 05/14/2020, 9:51 AM

## 2020-05-14 NOTE — Patient Instructions (Signed)
Continue dressing changes with Xeroform gauze topically and then wrapping with gauze and then tape, change daily

## 2020-05-15 DIAGNOSIS — H3093 Unspecified chorioretinal inflammation, bilateral: Secondary | ICD-10-CM | POA: Diagnosis not present

## 2020-05-28 ENCOUNTER — Telehealth: Payer: Self-pay | Admitting: Nurse Practitioner

## 2020-05-28 NOTE — Telephone Encounter (Signed)
Confirmed with patient that no other family could bring her tomorrow. R/S to 06/07/20 at 3:55. Will be just over 3 weeks since last recheck.

## 2020-05-29 ENCOUNTER — Ambulatory Visit: Payer: Medicare PPO | Admitting: Family Medicine

## 2020-06-07 ENCOUNTER — Ambulatory Visit: Payer: Medicare PPO | Admitting: Family Medicine

## 2020-06-21 ENCOUNTER — Encounter: Payer: Self-pay | Admitting: Family

## 2020-06-21 ENCOUNTER — Ambulatory Visit (INDEPENDENT_AMBULATORY_CARE_PROVIDER_SITE_OTHER): Payer: Medicare PPO | Admitting: Family

## 2020-06-21 DIAGNOSIS — R399 Unspecified symptoms and signs involving the genitourinary system: Secondary | ICD-10-CM

## 2020-06-21 MED ORDER — CEPHALEXIN 500 MG PO CAPS: 500.0000 mg | ORAL_CAPSULE | Freq: Two times a day (BID) | ORAL | 0 refills | Status: DC

## 2020-06-21 NOTE — Progress Notes (Signed)
   Virtual Visit via telephone Note Due to COVID-19 pandemic this visit was conducted virtually. This visit type was conducted due to national recommendations for restrictions regarding the COVID-19 Pandemic (e.g. social distancing, sheltering in place) in an effort to limit this patient's exposure and mitigate transmission in our community. All issues noted in this document were discussed and addressed.  A physical exam was not performed with this format.  I connected with Valerie [name] on 06/21/20 at 9:17 AM  by telephone and verified that I am speaking with the correct person using two identifiers. Valerie [name] is currently located at home and no one is currently with her during visit. The provider, Evelina Dun, FNP is located in their office at time of visit.  I discussed the limitations, risks, security and privacy concerns of performing an evaluation and management service by telephone and the availability of in person appointments. I also discussed with the patient that there may be a patient responsible charge related to this service. The patient expressed understanding and agreed to proceed.   History and Present Illness:  Urinary Frequency  This is a new problem. The current episode started yesterday. The problem occurs every urination. The problem has been gradually improving. The pain is at a severity of 0/10. The patient is experiencing no pain. Associated symptoms include frequency, hesitancy and urgency. Pertinent negatives include no discharge, flank pain, hematuria, nausea or vomiting. She has tried increased fluids for the symptoms. The treatment provided no relief.      Review of Systems  Gastrointestinal: Negative for nausea and vomiting.  Genitourinary: Positive for frequency, hesitancy and urgency. Negative for flank pain and hematuria.  All other systems reviewed and are negative.    Observations/Objective: No SOB or distress noted   Assessment and Plan: 1. UTI  symptoms Force fluids RTO if symptoms worsen or do not improve  Culture pending - cephALEXin (KEFLEX) 500 MG capsule; Take 1 capsule (500 mg total) by mouth 2 (two) times daily.  Dispense: 14 capsule; Refill: 0      I discussed the assessment and treatment plan with the patient. The patient was provided an opportunity to ask questions and all were answered. The patient agreed with the plan and demonstrated an understanding of the instructions.   The patient was advised to call back or seek an in-person evaluation if the symptoms worsen or if the condition fails to improve as anticipated.  The above assessment and management plan was discussed with the patient. The patient verbalized understanding of and has agreed to the management plan. Patient is aware to call the clinic if symptoms persist or worsen. Patient is aware when to return to the clinic for a follow-up visit. Patient educated on when it is appropriate to go to the emergency department.   Time call ended:  9:24 AM   I provided 7 minutes of non-face-to-face time during this encounter.    Evelina Dun, FNP

## 2020-07-01 ENCOUNTER — Ambulatory Visit (INDEPENDENT_AMBULATORY_CARE_PROVIDER_SITE_OTHER): Payer: Medicare PPO | Admitting: Nurse Practitioner

## 2020-07-01 ENCOUNTER — Other Ambulatory Visit: Payer: Self-pay

## 2020-07-01 ENCOUNTER — Encounter: Payer: Self-pay | Admitting: Nurse Practitioner

## 2020-07-01 ENCOUNTER — Ambulatory Visit (INDEPENDENT_AMBULATORY_CARE_PROVIDER_SITE_OTHER): Payer: Medicare PPO

## 2020-07-01 VITALS — BP 157/75 | HR 71 | Temp 97.6°F | Resp 20 | Ht 64.0 in | Wt 114.0 lb

## 2020-07-01 DIAGNOSIS — R0789 Other chest pain: Secondary | ICD-10-CM | POA: Diagnosis not present

## 2020-07-01 DIAGNOSIS — W19XXXA Unspecified fall, initial encounter: Secondary | ICD-10-CM

## 2020-07-01 DIAGNOSIS — S2231XA Fracture of one rib, right side, initial encounter for closed fracture: Secondary | ICD-10-CM | POA: Diagnosis not present

## 2020-07-01 DIAGNOSIS — Z23 Encounter for immunization: Secondary | ICD-10-CM

## 2020-07-01 DIAGNOSIS — S2232XA Fracture of one rib, left side, initial encounter for closed fracture: Secondary | ICD-10-CM | POA: Diagnosis not present

## 2020-07-01 MED ORDER — LIDOCAINE 5 % EX PTCH: 1.0000 | MEDICATED_PATCH | CUTANEOUS | 0 refills | Status: DC

## 2020-07-01 NOTE — Progress Notes (Signed)
   Subjective:    Patient ID: Valerie [name], female    DOB: Aug 17, 1928, 86 y.o.   MRN: [id number]   Chief Complaint: Fall   HPI Patient comes in today c/o a fall. She said she was in the shower and she became dizzy. When she stepped out she fell. She said she fell on her left hip.  Most of her pain is under her left breast and around her back. Denies any pain in hip when walking. She was unable to rate her pain. She denies heating her head when she fell, and she said it didn't knock her out, but she does not remember much about it. She had to call her children to get her out of the floor.   Review of Systems  Constitutional: Negative for diaphoresis.  Eyes: Negative for pain.  Respiratory: Negative for shortness of breath.   Cardiovascular: Negative for chest pain, palpitations and leg swelling.  Gastrointestinal: Negative for abdominal pain.  Endocrine: Negative for polydipsia.  Skin: Negative for rash.  Neurological: Negative for dizziness, weakness and headaches.  Hematological: Does not bruise/bleed easily.  All other systems reviewed and are negative.      Objective:   Physical Exam Vitals and nursing note reviewed.  Constitutional:      Appearance: Normal appearance.  Cardiovascular:     Rate and Rhythm: Normal rate and regular rhythm.     Pulses: Normal pulses.     Heart sounds: Normal heart sounds.  Skin:    General: Skin is warm and dry.     Capillary Refill: Capillary refill takes less than 2 seconds.     Comments: Light green contusion on left flank and around to back. Area is tender to touch.  Neurological:     Mental Status: She is alert and oriented to person, place, and time.  Psychiatric:        Mood and Affect: Mood normal.    BP (!) 157/75   Pulse 71   Temp 97.6 F (36.4 C) (Temporal)   Resp 20   Ht 5\' 4"  (1.626 m)   Wt 114 lb (51.7 kg)   SpO2 97%   BMI 19.57 kg/m   Rib xray- lower rib fracture on left-Preliminary reading by Ronnald Collum, FNP   [facility]       Assessment & Plan:  Valerie [name] in today with chief complaint of Fall   1. Fall, initial encounter - DG Ribs Unilateral W/Chest Left  2. Closed fracture of one rib of left side, initial encounter Lidoderm patch- change evEry 12 hours Rest rto PRN    The above assessment and management plan was discussed with the patient. The patient verbalized understanding of and has agreed to the management plan. Patient is aware to call the clinic if symptoms persist or worsen. Patient is aware when to return to the clinic for a follow-up visit. Patient educated on when it is appropriate to go to the emergency department.   Mary-Margaret Hassell Done, FNP

## 2020-07-01 NOTE — Patient Instructions (Signed)
Rib Fracture  A rib fracture is a break or crack in one of the bones of the ribs. The ribs are long, curved bones that wrap around your chest and attach to your spine and your breastbone. The ribs protect your heart, lungs, and other organs in the chest. A broken or cracked rib is often painful but is not usually serious. Most rib fractures heal on their own over time. However, rib fractures can be more serious if multiple ribs are broken or if broken ribs move out of place and push against other structures or organs. What are the causes? This condition is caused by:  Repetitive movements with high force, such as pitching a baseball or having severe coughing spells.  A direct blow to the chest, such as a sports injury, a car accident, or a fall.  Cancer that has spread to the bones, which can weaken bones and cause them to break. What are the signs or symptoms? Symptoms of this condition include:  Pain when you breathe in or cough.  Pain when someone presses on the injured area.  Feeling short of breath. How is this diagnosed? This condition is diagnosed with a physical exam and medical history. Imaging tests may also be done, such as:  Chest X-ray.  CT scan.  MRI.  Bone scan.  Chest ultrasound. How is this treated? Treatment for this condition depends on the severity of the fracture. Most rib fractures usually heal on their own in 1-3 months. Sometimes healing takes longer if there is a cough that does not stop or if there are other activities that make the injury worse (aggravating factors). While you heal, you will be given medicines to control the pain. You will also be taught deep breathing exercises. Severe injuries may require hospitalization or surgery. Follow these instructions at home: Managing pain, stiffness, and swelling  If directed, apply ice to the injured area. ? Put ice in a plastic bag. ? Place a towel between your skin and the bag. ? Leave the ice on for  20 minutes, 2-3 times a [name].  Take over-the-counter and prescription medicines only as told by your health care provider. Activity  Avoid a lot of activity and any activities or movements that cause pain. Be careful during activities and avoid bumping the injured rib.  Slowly increase your activity as told by your health care provider. General instructions  Do deep breathing exercises as told by your health care provider. This helps prevent pneumonia, which is a common complication of a broken rib. Your health care provider may instruct you to: ? Take deep breaths several times a [name]. ? Try to cough several times a [name], holding a pillow against the injured area. ? Use a device called incentive spirometer to practice deep breathing several times a [name].  Drink enough fluid to keep your urine pale yellow.  Do not wear a rib belt or binder. These restrict breathing, which can lead to pneumonia.  Keep all follow-up visits as told by your health care provider. This is important. Contact a health care provider if:  You have a fever. Get help right away if:  You have difficulty breathing or you are short of breath.  You develop a cough that does not stop, or you cough up thick or bloody sputum.  You have nausea, vomiting, or pain in your abdomen.  Your pain gets worse and medicine does not help. Summary  A rib fracture is a break or crack in one of  the bones of the ribs.  A broken or cracked rib is often painful but is not usually serious.  Most rib fractures heal on their own over time.  Treatment for this condition depends on the severity of the fracture.  Avoid a lot of activity and any activities or movements that cause pain. This information is not intended to replace advice given to you by your health care provider. Make sure you discuss any questions you have with your health care provider. Document Revised: 08/27/2017 Document Reviewed: 12/14/2016 Elsevier Patient  Education  Valerie [name].

## 2020-07-02 ENCOUNTER — Telehealth: Payer: Self-pay

## 2020-07-02 NOTE — Telephone Encounter (Signed)
Patient's daughter very upset and states that patient is in a lot of pain and has someone with her at all times.  Daughter wants to know what is exactly not safe about patient taking any pain medication.  Advised daughter of Age and falls.  Daughter states that "maybe I need to contact someone higher up"

## 2020-07-02 NOTE — Telephone Encounter (Signed)
Not safe for patient to have pain medication

## 2020-07-02 NOTE — Telephone Encounter (Signed)
Daughter aware and verbalized understanding

## 2020-07-02 NOTE — Telephone Encounter (Signed)
Due to patients frailty and the fact that she has already had a fall, pain medication is not safe. I consulted with another physician in the office and they are in agreeement that pain medication is not safe for her. The lidoderm patches, and tylenol  should help along with ice. Splinting the area with a pillow may also help. If pain continues we can do orthopedic referral

## 2020-07-02 NOTE — Telephone Encounter (Signed)
Daughter [name] calling for pt--she says that pt does not like lidocaine (LIDODERM) 5 % and wants pain pills. Daughter says she has a someone at home and does not need to worry about her falling. Use [facility] Please call back

## 2020-07-04 ENCOUNTER — Telehealth: Payer: Self-pay | Admitting: *Deleted

## 2020-07-04 NOTE — Telephone Encounter (Signed)
Prior Auth for Lidoderm 5% patch-In Process  Key: [id number] -   PA Case ID: [id number]   Your information has been submitted to [facility]. [employer] will review the request and will issue a decision, typically within 3-7 days from your submission. You can check the updated outcome later by reopening this request.  If [employer] has not responded in 3-7 days or if you have any questions about your ePA request, please contact [employer] at [phone]. If you think there may be a problem with your PA request, use our live chat feature at the bottom right.

## 2020-07-05 ENCOUNTER — Other Ambulatory Visit: Payer: Self-pay | Admitting: Cardiovascular Disease

## 2020-07-05 NOTE — Telephone Encounter (Signed)
Pt last saw Dr Acie Fredrickson 12/05/19, last labs 08/29/19 Creat 0.89, age 86, weight 51.7kg, based on specified criteria pt is on appropriate dosage of Eliquis 2.5mg  BID.  Will refill rx.

## 2020-07-08 NOTE — Telephone Encounter (Signed)
Medicare has denied 5% Lido patches.  Called pt and informed. She states that she has already picked up the patches and paid $90 for them.  Per patient she no longer wants to apply the patches because they make her arm sore and almost peeled the skin off when she removed them.   Patient will continue taking OTC Tylenol and use a heating pad at this time for pain.

## 2020-07-23 ENCOUNTER — Ambulatory Visit: Payer: Medicare PPO | Admitting: Nurse Practitioner

## 2020-07-23 DIAGNOSIS — S0990XA Unspecified injury of head, initial encounter: Secondary | ICD-10-CM | POA: Diagnosis not present

## 2020-07-23 DIAGNOSIS — S6292XA Unspecified fracture of left wrist and hand, initial encounter for closed fracture: Secondary | ICD-10-CM | POA: Diagnosis not present

## 2020-07-23 DIAGNOSIS — S0083XA Contusion of other part of head, initial encounter: Secondary | ICD-10-CM | POA: Diagnosis not present

## 2020-07-23 DIAGNOSIS — I10 Essential (primary) hypertension: Secondary | ICD-10-CM | POA: Diagnosis not present

## 2020-07-23 DIAGNOSIS — I70203 Unspecified atherosclerosis of native arteries of extremities, bilateral legs: Secondary | ICD-10-CM | POA: Diagnosis not present

## 2020-07-23 DIAGNOSIS — Y929 Unspecified place or not applicable: Secondary | ICD-10-CM | POA: Diagnosis not present

## 2020-07-23 DIAGNOSIS — E785 Hyperlipidemia, unspecified: Secondary | ICD-10-CM | POA: Diagnosis not present

## 2020-07-23 DIAGNOSIS — S62617A Displaced fracture of proximal phalanx of left little finger, initial encounter for closed fracture: Secondary | ICD-10-CM | POA: Diagnosis not present

## 2020-07-23 DIAGNOSIS — W010XXA Fall on same level from slipping, tripping and stumbling without subsequent striking against object, initial encounter: Secondary | ICD-10-CM | POA: Diagnosis not present

## 2020-07-23 DIAGNOSIS — L84 Corns and callosities: Secondary | ICD-10-CM | POA: Diagnosis not present

## 2020-07-23 DIAGNOSIS — B351 Tinea unguium: Secondary | ICD-10-CM | POA: Diagnosis not present

## 2020-07-23 DIAGNOSIS — R262 Difficulty in walking, not elsewhere classified: Secondary | ICD-10-CM | POA: Diagnosis not present

## 2020-07-23 DIAGNOSIS — M79642 Pain in left hand: Secondary | ICD-10-CM | POA: Diagnosis not present

## 2020-07-23 DIAGNOSIS — Y93K9 Activity, other involving animal care: Secondary | ICD-10-CM | POA: Diagnosis not present

## 2020-07-23 DIAGNOSIS — M79676 Pain in unspecified toe(s): Secondary | ICD-10-CM | POA: Diagnosis not present

## 2020-08-05 ENCOUNTER — Other Ambulatory Visit: Payer: Self-pay | Admitting: Internal Medicine

## 2020-08-07 DIAGNOSIS — H1820 Unspecified corneal edema: Secondary | ICD-10-CM | POA: Diagnosis not present

## 2020-08-12 ENCOUNTER — Other Ambulatory Visit: Payer: Self-pay | Admitting: Nurse Practitioner

## 2020-08-12 DIAGNOSIS — K2900 Acute gastritis without bleeding: Secondary | ICD-10-CM

## 2020-08-14 DIAGNOSIS — H1820 Unspecified corneal edema: Secondary | ICD-10-CM | POA: Diagnosis not present

## 2020-08-28 DIAGNOSIS — H35353 Cystoid macular degeneration, bilateral: Secondary | ICD-10-CM | POA: Diagnosis not present

## 2020-08-28 DIAGNOSIS — H1820 Unspecified corneal edema: Secondary | ICD-10-CM | POA: Diagnosis not present

## 2020-08-28 DIAGNOSIS — H3090 Unspecified chorioretinal inflammation, unspecified eye: Secondary | ICD-10-CM | POA: Diagnosis not present

## 2020-09-04 ENCOUNTER — Other Ambulatory Visit: Payer: Self-pay | Admitting: Nurse Practitioner

## 2020-09-04 DIAGNOSIS — K2900 Acute gastritis without bleeding: Secondary | ICD-10-CM

## 2020-09-16 ENCOUNTER — Other Ambulatory Visit: Payer: Self-pay | Admitting: Nurse Practitioner

## 2020-09-17 ENCOUNTER — Telehealth: Payer: Self-pay

## 2020-09-17 NOTE — Telephone Encounter (Signed)
Attempted to return call - NA 

## 2020-09-17 NOTE — Telephone Encounter (Signed)
rc for nurse 

## 2020-09-18 ENCOUNTER — Telehealth: Payer: Self-pay | Admitting: Cardiovascular Disease

## 2020-09-18 NOTE — Telephone Encounter (Signed)
Called patient's daughter back about message. She stated patient has been complaining of fluttering in her chest, not chest pain. Patient told her that the fluttering woke her up last night. She stated it comes and goes. Patient is not there to talk to she is out with another family member. She stated patient has been taking her eliquis and coreg, and her BP and HR is fine. Patient has appointment 10/28/20 with Dr. Acie Fredrickson. Will forward to Dr. Acie Fredrickson for advisement.

## 2020-09-18 NOTE — Telephone Encounter (Signed)
No additional recommendations I will see her next month

## 2020-09-18 NOTE — Telephone Encounter (Signed)
Pt c/o of Chest Pain: STAT if CP now or developed within 24 hours  1. Are you having CP right now? Patient's daughter states she is not currently with the patient.  2. Are you experiencing any other symptoms (ex. SOB, nausea, vomiting, sweating)? No   3. How long have you been experiencing CP? About 1 week, per patient's daughter   4. Is your CP continuous or coming and going? Coming and going  5. Have you taken Nitroglycerin? Yes   Patient's daughter states the patient has been experiencing chest pain for the past week. However, last night  It became worse and patient called 911. ?

## 2020-09-18 NOTE — Telephone Encounter (Signed)
Called patient's daughter with Dr. Elmarie Shiley message. Patient's daughter verbalized understanding.

## 2020-09-19 NOTE — Telephone Encounter (Signed)
Returned call to pt. No answer and no machine

## 2020-09-25 ENCOUNTER — Other Ambulatory Visit: Payer: Self-pay | Admitting: Internal Medicine

## 2020-09-25 ENCOUNTER — Other Ambulatory Visit: Payer: Self-pay | Admitting: Nurse Practitioner

## 2020-09-25 DIAGNOSIS — K2900 Acute gastritis without bleeding: Secondary | ICD-10-CM

## 2020-10-09 ENCOUNTER — Telehealth: Payer: Self-pay | Admitting: Cardiovascular Disease

## 2020-10-09 NOTE — Telephone Encounter (Signed)
Attempted outreach to caller.  Call went to VM.   Left message to call back.

## 2020-10-09 NOTE — Telephone Encounter (Signed)
New Message:   Daughter is calling said pt's blood pressure was so high she called EMS. They came and told her she needs to see her doctor today, pt did not want to go to the ER.   Pt c/o BP issue: STAT if pt c/o blurred vision, one-sided weakness or slurred speech  1. What are your last 5 BP readings? 189/74 last night- today 188/91 190/76  2. Are you having any other symptoms (ex. Dizziness, headache, blurred vision, passed out)?no,little pain in her neck  3. What is your BP issue? Blood Pressure is high

## 2020-10-10 ENCOUNTER — Ambulatory Visit: Payer: Medicare PPO | Admitting: Nurse Practitioner

## 2020-10-10 ENCOUNTER — Other Ambulatory Visit: Payer: Self-pay

## 2020-10-10 ENCOUNTER — Encounter: Payer: Self-pay | Admitting: Nurse Practitioner

## 2020-10-10 VITALS — BP 157/60 | HR 60 | Temp 97.3°F | Ht 64.0 in | Wt 109.4 lb

## 2020-10-10 DIAGNOSIS — Z23 Encounter for immunization: Secondary | ICD-10-CM | POA: Diagnosis not present

## 2020-10-10 DIAGNOSIS — I10 Essential (primary) hypertension: Secondary | ICD-10-CM | POA: Diagnosis not present

## 2020-10-10 MED ORDER — LOSARTAN POTASSIUM 100 MG PO TABS: 100.0000 mg | ORAL_TABLET | Freq: Every day | ORAL | 1 refills | Status: DC

## 2020-10-10 NOTE — Patient Instructions (Signed)

## 2020-10-10 NOTE — Progress Notes (Signed)
   Subjective:    Patient ID: Valerie [name], female    DOB: 1928/07/25, 87 y.o.   MRN: [id number]   Chief Complaint: Hypertension   HPI Patient called ems because her blood pressure was 190/90. The EMS checked her blood pressure and did an EKG. They said it was good. They did not take her  To the hospital. Has still been running over 160 systolic. This morning when she woke up it was 170/90. The only blood pressure meds she is on is coreg 3.125. heart rate 56. She says she feels tired.   Review of Systems  Constitutional: Negative.   Respiratory: Negative.   Cardiovascular: Negative for chest pain, palpitations and leg swelling.  Gastrointestinal: Negative.   Genitourinary: Negative.   Neurological: Negative for headaches.  Psychiatric/Behavioral: Negative.   All other systems reviewed and are negative.      Objective:   Physical Exam Vitals and nursing note reviewed.  Constitutional:      Appearance: Normal appearance.  Cardiovascular:     Rate and Rhythm: Normal rate and regular rhythm.     Heart sounds: Normal heart sounds.  Pulmonary:     Breath sounds: Normal breath sounds.  Musculoskeletal:     Right lower leg: No edema.     Left lower leg: No edema.  Skin:    General: Skin is warm and dry.  Neurological:     General: No focal deficit present.     Mental Status: She is alert.  Psychiatric:        Mood and Affect: Mood normal.        Behavior: Behavior normal.    BP (!) 157/60   Pulse 60   Temp (!) 97.3 F (36.3 C)   Ht 5' 4" (1.626 m)   Wt 109 lb 6.4 oz (49.6 kg)   SpO2 100%   BMI 18.78 kg/m         Assessment & Plan:  Valerie [name] in today with chief complaint of Hypertension   1. Essential hypertension Added losartan 171m 1 po dialy Low sodium diet Keep diary of blood pressure - CBC with Differential/Platelet - CMP14+EGFR  Meds ordered this encounter  Medications  . losartan (COZAAR) 100 MG tablet    Sig: Take 1 tablet (100 mg total) by  mouth daily.    Dispense:  90 tablet    Refill:  1    Order Specific Question:   Supervising Provider    Answer:   DCaryl PinaA [[id number]  .  The above assessment and management plan was discussed with the patient. The patient verbalized understanding of and has agreed to the management plan. Patient is aware to call the clinic if symptoms persist or worsen. Patient is aware when to return to the clinic for a follow-up visit. Patient educated on when it is appropriate to go to the emergency department.   Mary-Margaret MHassell Done FNP

## 2020-10-10 NOTE — Telephone Encounter (Signed)
Left message to call back. Pt has appointment today w PCP. Asked for call back w update so we can let Dr. Acie Fredrickson know.

## 2020-10-11 LAB — CMP14+EGFR
ALT: 11 IU/L (ref 0–32)
AST: 18 IU/L (ref 0–40)
Albumin/Globulin Ratio: 1.7 (ref 1.2–2.2)
Albumin: 4.8 g/dL — ABNORMAL HIGH (ref 3.5–4.6)
Alkaline Phosphatase: 88 IU/L (ref 44–121)
BUN/Creatinine Ratio: 21 (ref 12–28)
BUN: 24 mg/dL (ref 10–36)
Bilirubin Total: 0.5 mg/dL (ref 0.0–1.2)
CO2: 21 mmol/L (ref 20–29)
Calcium: 10.3 mg/dL (ref 8.7–10.3)
Chloride: 105 mmol/L (ref 96–106)
Creatinine, Ser: 1.16 mg/dL — ABNORMAL HIGH (ref 0.57–1.00)
GFR calc Af Amer: 48 mL/min/{1.73_m2} — ABNORMAL LOW (ref >59)
GFR calc non Af Amer: 42 mL/min/{1.73_m2} — ABNORMAL LOW (ref >59)
Globulin, Total: 2.8 g/dL (ref 1.5–4.5)
Glucose: 80 mg/dL (ref 65–99)
Potassium: 4.2 mmol/L (ref 3.5–5.2)
Sodium: 147 mmol/L — ABNORMAL HIGH (ref 134–144)
Total Protein: 7.6 g/dL (ref 6.0–8.5)

## 2020-10-11 LAB — CBC WITH DIFFERENTIAL/PLATELET
Basophils Absolute: 0 10*3/uL (ref 0.0–0.2)
Basos: 0 %
EOS (ABSOLUTE): 0.1 10*3/uL (ref 0.0–0.4)
Eos: 1 %
Hematocrit: 42.4 % (ref 34.0–46.6)
Hemoglobin: 14.6 g/dL (ref 11.1–15.9)
Immature Grans (Abs): 0 10*3/uL (ref 0.0–0.1)
Immature Granulocytes: 0 %
Lymphocytes Absolute: 2.6 10*3/uL (ref 0.7–3.1)
Lymphs: 28 %
MCH: 33.6 pg — ABNORMAL HIGH (ref 26.6–33.0)
MCHC: 34.4 g/dL (ref 31.5–35.7)
MCV: 98 fL — ABNORMAL HIGH (ref 79–97)
Monocytes Absolute: 0.6 10*3/uL (ref 0.1–0.9)
Monocytes: 7 %
Neutrophils Absolute: 5.7 10*3/uL (ref 1.4–7.0)
Neutrophils: 64 %
Platelets: 326 10*3/uL (ref 150–450)
RBC: 4.35 x10E6/uL (ref 3.77–5.28)
RDW: 12.7 % (ref 11.7–15.4)
WBC: 9 10*3/uL (ref 3.4–10.8)

## 2020-10-14 ENCOUNTER — Ambulatory Visit: Payer: Medicare PPO | Admitting: Physician Assistant

## 2020-10-18 ENCOUNTER — Telehealth: Payer: Self-pay | Admitting: Nurse Practitioner

## 2020-10-18 NOTE — Telephone Encounter (Signed)
Daughter reports that since mom started Losartan 100 mg after her visit the other day she has been complaining of having fatigue, being dizzy and light headed.  She said her mom has not taken her blood pressure so they are unsure of readings.  Daughter is at work now but will call her mother and ask her to take blood pressure and daughter will call us back with number.

## 2020-10-18 NOTE — Telephone Encounter (Signed)
See below, mother's blood pressure is 160/69.

## 2020-10-18 NOTE — Telephone Encounter (Signed)
Patients daughter notified and verbalized understanding. Will call us back Monday with some BP readings

## 2020-10-18 NOTE — Telephone Encounter (Signed)
Try breaking losartan in half and taking 1/2 tablet daily. Keep check of blood pressure.

## 2020-10-18 NOTE — Telephone Encounter (Signed)
Pt started a new bp medicine after appt on 1/13 and has not been feeling good. Daughter was calling to see if it may be because of the new meds.

## 2020-10-28 ENCOUNTER — Ambulatory Visit: Payer: Medicare PPO | Admitting: Cardiovascular Disease

## 2020-10-28 ENCOUNTER — Ambulatory Visit: Payer: Medicare PPO | Admitting: Nurse Practitioner

## 2020-10-28 ENCOUNTER — Encounter: Payer: Self-pay | Admitting: Nurse Practitioner

## 2020-10-28 ENCOUNTER — Other Ambulatory Visit: Payer: Self-pay

## 2020-10-28 VITALS — BP 163/72 | HR 68 | Temp 96.3°F | Resp 20 | Ht 64.0 in | Wt 107.0 lb

## 2020-10-28 DIAGNOSIS — K5730 Diverticulosis of large intestine without perforation or abscess without bleeding: Secondary | ICD-10-CM | POA: Diagnosis not present

## 2020-10-28 DIAGNOSIS — I10 Essential (primary) hypertension: Secondary | ICD-10-CM

## 2020-10-28 DIAGNOSIS — R5383 Other fatigue: Secondary | ICD-10-CM

## 2020-10-28 DIAGNOSIS — E0590 Thyrotoxicosis, unspecified without thyrotoxic crisis or storm: Secondary | ICD-10-CM

## 2020-10-28 DIAGNOSIS — E782 Mixed hyperlipidemia: Secondary | ICD-10-CM | POA: Diagnosis not present

## 2020-10-28 DIAGNOSIS — E538 Deficiency of other specified B group vitamins: Secondary | ICD-10-CM | POA: Diagnosis not present

## 2020-10-28 DIAGNOSIS — D509 Iron deficiency anemia, unspecified: Secondary | ICD-10-CM

## 2020-10-28 DIAGNOSIS — I480 Paroxysmal atrial fibrillation: Secondary | ICD-10-CM | POA: Diagnosis not present

## 2020-10-28 MED ORDER — METHIMAZOLE 5 MG PO TABS: 2.5000 mg | ORAL_TABLET | Freq: Every day | ORAL | 1 refills | Status: DC

## 2020-10-28 MED ORDER — PRAVASTATIN SODIUM 10 MG PO TABS: 10.0000 mg | ORAL_TABLET | Freq: Every day | ORAL | 1 refills | Status: DC

## 2020-10-28 MED ORDER — AMLODIPINE BESYLATE 10 MG PO TABS: 10.0000 mg | ORAL_TABLET | Freq: Every day | ORAL | 1 refills | Status: DC

## 2020-10-28 MED ORDER — CARVEDILOL 3.125 MG PO TABS: ORAL_TABLET | ORAL | 1 refills | Status: DC

## 2020-10-28 NOTE — Patient Instructions (Signed)

## 2020-10-28 NOTE — Progress Notes (Signed)
Subjective:    Patient ID: Valerie [name], female    DOB: [birth year], 87 y.o.   MRN: [id number]   Chief Complaint: Medical Management of Chronic Issues    HPI:  1. Essential hypertension No c/o chest pain, sob or headache. Does not check blood pressure at home. She was seen several weeks ago with elevated blood pressure and we started her on losartan. Patient c/o fatigue and dizziness since starting meds so they have been cutting it in half. Blood pressure are running over 056 systolic. BP Readings from Last 3 Encounters:  10/28/20 (!) 163/72  10/10/20 (!) 157/60  07/01/20 (!) 157/75     2. Paroxysmal atrial fibrillation (HCC) No c/o palpitations or heart racing  3. Diverticulosis of colon No recent flare ups  4. Hyperthyroidism No problems that she is aware of  5. Other fatigue Always feel fatigued  6. Mixed hyperlipidemia Does watch diet and is not able to do any exercise.  7. Iron deficiency anemia, unspecified iron deficiency anemia type Lab Results  Component Value Date   HGB 14.6 10/10/2020     8. Vitamin B12 deficiency She use to get b12 injections but has not got in years.    Outpatient Encounter Medications as of 10/28/2020  Medication Sig  . acetaminophen (TYLENOL) 325 MG tablet Take 650 mg by mouth every 6 (six) hours as needed for headache (pain).  Marland Kitchen amLODipine (NORVASC) 5 MG tablet TAKE 1 TABLET ONCE A DAY  . Azelastine HCl 0.15 % SOLN Place 2 sprays into the nose in the morning and at bedtime.  . carvedilol (COREG) 3.125 MG tablet TAKE  (1)  TABLET TWICE A DAY WITH MEALS (BREAKFAST AND SUPPER)  . diclofenac Sodium (VOLTAREN) 1 % GEL Apply 2 g topically 4 (four) times daily.  Marland Kitchen ELIQUIS 2.5 MG TABS tablet TAKE  (1)  TABLET TWICE A DAY.  . fluticasone (FLONASE) 50 MCG/ACT nasal spray Place 2 sprays into both nostrils as needed for allergies or rhinitis.  Marland Kitchen lidocaine (LIDODERM) 5 % Place 1 patch onto the skin daily. Remove & Discard patch within 12  hours or as directed by MD  . losartan (COZAAR) 100 MG tablet Take 1 tablet (100 mg total) by mouth daily.  . methimazole (TAPAZOLE) 5 MG tablet TAKE 1/2 TABLET DAILY  . mupirocin ointment (BACTROBAN) 2 % Apply 3 application topically as needed.  . nitroGLYCERIN (NITROSTAT) 0.4 MG SL tablet PLACE 1 TABLET UNDER THE TONGUE AT ONSET OF CHEST PAIN EVERY 5 MINTUES UP TO 3 TIMES AS NEEDED  . pantoprazole (PROTONIX) 40 MG tablet TAKE 1 TABLET DAILY  . pravastatin (PRAVACHOL) 10 MG tablet TAKE 1 TABLET ONCE A DAY  . silver sulfADIAZINE (SILVADENE) 1 % cream Apply 1 application topically as directed.  . Vitamin D, Ergocalciferol, (DRISDOL) 1.25 MG (50000 UNIT) CAPS capsule Take 1 capsule (50,000 Units total) by mouth every 7 (seven) days.  . [DISCONTINUED] cephALEXin (KEFLEX) 500 MG capsule Take 1 capsule (500 mg total) by mouth 2 (two) times daily.   No facility-administered encounter medications on file as of 10/28/2020.    Past Surgical History:  Procedure Laterality Date  . CHOLECYSTECTOMY    . COLONOSCOPY    . DILATION AND CURETTAGE OF UTERUS    . EYE SURGERY Bilateral    total of 5  . LASER PHOTO ABLATION Right 10/20/2016   Procedure: LASER PHOTO ABLATION;  Surgeon: Hayden Pedro, MD;  Location: [facility];  Service: Ophthalmology;  Laterality: Right;  Headscope laser  . LEFT ROTATOR CUFF REPAIR X2 Left   . PARS PLANA VITRECTOMY  10/20/2016   WITH 25G REMOVAL/SUTURE SECONDARY INTRAOCULAR LENS, GAS FLUID EXCHANGE   . PARS PLANA VITRECTOMY Right 10/20/2016   Procedure: PARS PLANA VITRECTOMY WITH 25G REMOVAL/SUTURE SECONDARY INTRAOCULAR LENS, GAS FLUID EXCHANGE;  Surgeon: Hayden Pedro, MD;  Location: [facility];  Service: Ophthalmology;  Laterality: Right;  . RIGHT ROTATOR CUFF REPAIR X1 Right   . UPPER GASTROINTESTINAL ENDOSCOPY     with dilation    Family History  Problem Relation Age of Onset  . Leukemia Mother   . Colon cancer Father 17  . Kidney cancer Brother   . Bladder Cancer Brother    . Stroke Sister   . Esophageal cancer Neg Hx   . Stomach cancer Neg Hx     New complaints: None today  Social history: Lives by herself and her family check son her daily  Controlled substance contract: n/a    Review of Systems  Constitutional: Negative for diaphoresis.  Eyes: Positive for visual disturbance. Negative for pain.  Respiratory: Negative for shortness of breath.   Cardiovascular: Negative for chest pain, palpitations and leg swelling.  Gastrointestinal: Negative for abdominal pain.  Endocrine: Negative for polydipsia.  Skin: Negative for rash.  Neurological: Positive for dizziness (at times). Negative for weakness and headaches.  Hematological: Does not bruise/bleed easily.  All other systems reviewed and are negative.      Objective:   Physical Exam Vitals and nursing note reviewed.  Constitutional:      General: She is not in acute distress.    Appearance: Normal appearance. She is well-developed and well-nourished.  HENT:     Head: Normocephalic.     Nose: Nose normal.     Mouth/Throat:     Mouth: Oropharynx is clear and moist.  Eyes:     Extraocular Movements: EOM normal.     Pupils: Pupils are equal, round, and reactive to light.  Neck:     Vascular: No carotid bruit or JVD.  Cardiovascular:     Rate and Rhythm: Normal rate and regular rhythm.     Pulses: Intact distal pulses.     Heart sounds: Normal heart sounds.  Pulmonary:     Effort: Pulmonary effort is normal. No respiratory distress.     Breath sounds: Normal breath sounds. No wheezing or rales.  Chest:     Chest wall: No tenderness.  Abdominal:     General: Bowel sounds are normal. There is no distension or abdominal bruit. Aorta is normal.     Palpations: Abdomen is soft. There is no hepatomegaly, splenomegaly, mass or pulsatile mass.     Tenderness: There is no abdominal tenderness.  Musculoskeletal:        General: No edema. Normal range of motion.     Cervical back: Normal  range of motion and neck supple.  Lymphadenopathy:     Cervical: No cervical adenopathy.  Skin:    General: Skin is warm and dry.  Neurological:     Mental Status: She is alert and oriented to person, place, and time.     Deep Tendon Reflexes: Reflexes are normal and symmetric.  Psychiatric:        Mood and Affect: Mood and affect normal.        Behavior: Behavior normal.        Thought Content: Thought content normal.        Judgment: Judgment normal.  BP (!) 163/72   Pulse 68   Temp (!) 96.3 F (35.7 C) (Temporal)   Resp 20   Ht 5' 4"  (1.626 m)   Wt 107 lb (48.5 kg)   SpO2 98%   BMI 18.37 kg/m        Assessment & Plan:  Valerie [name] comes in today with chief complaint of Medical Management of Chronic Issues   Diagnosis and orders addressed:  1. Essential hypertension Increased amlodipine to 32m daily Continue with losartan 1/2 tablet for now Call with blood pressure results - amLODipine (NORVASC) 10 MG tablet; Take 1 tablet (10 mg total) by mouth daily.  Dispense: 90 tablet; Refill: 1 - CBC with Differential/Platelet - CMP14+EGFR  2. Paroxysmal atrial fibrillation (HCC) Avoid caffeine  3. Diverticulosis of colon Avoid foods that caus eflare up  4. Hyperthyroidism - Thyroid Panel With TSH  5. Other fatigue  6. Mixed hyperlipidemia Low fat diet - Lipid panel - carvedilol (COREG) 3.125 MG tablet; TAKE  (1)  TABLET TWICE A DAY WITH MEALS (BREAKFAST AND SUPPER)  Dispense: 180 tablet; Refill: 1 - pravastatin (PRAVACHOL) 10 MG tablet; Take 1 tablet (10 mg total) by mouth daily.  Dispense: 90 tablet; Refill: 1  7. Iron deficiency anemia, unspecified iron deficiency anemia type Labs oending  8. Vitamin B12 deficiency Labs pending - Vitamin B12   Labs pending Health Maintenance reviewed Diet and exercise encouraged  Follow up plan: 3 months   Mary-Margaret MHassell Done FNP

## 2020-10-29 LAB — CBC WITH DIFFERENTIAL/PLATELET
Basophils Absolute: 0 10*3/uL (ref 0.0–0.2)
Basos: 0 %
EOS (ABSOLUTE): 0.1 10*3/uL (ref 0.0–0.4)
Eos: 1 %
Hematocrit: 40.7 % (ref 34.0–46.6)
Hemoglobin: 14 g/dL (ref 11.1–15.9)
Immature Grans (Abs): 0 10*3/uL (ref 0.0–0.1)
Immature Granulocytes: 0 %
Lymphocytes Absolute: 2.3 10*3/uL (ref 0.7–3.1)
Lymphs: 31 %
MCH: 33.3 pg — ABNORMAL HIGH (ref 26.6–33.0)
MCHC: 34.4 g/dL (ref 31.5–35.7)
MCV: 97 fL (ref 79–97)
Monocytes Absolute: 0.6 10*3/uL (ref 0.1–0.9)
Monocytes: 7 %
Neutrophils Absolute: 4.6 10*3/uL (ref 1.4–7.0)
Neutrophils: 61 %
Platelets: 367 10*3/uL (ref 150–450)
RBC: 4.2 x10E6/uL (ref 3.77–5.28)
RDW: 12.7 % (ref 11.7–15.4)
WBC: 7.6 10*3/uL (ref 3.4–10.8)

## 2020-10-29 LAB — CMP14+EGFR
ALT: 12 IU/L (ref 0–32)
AST: 22 IU/L (ref 0–40)
Albumin/Globulin Ratio: 1.7 (ref 1.2–2.2)
Albumin: 4.5 g/dL (ref 3.5–4.6)
Alkaline Phosphatase: 81 IU/L (ref 44–121)
BUN/Creatinine Ratio: 18 (ref 12–28)
BUN: 17 mg/dL (ref 10–36)
Bilirubin Total: 0.6 mg/dL (ref 0.0–1.2)
CO2: 21 mmol/L (ref 20–29)
Calcium: 10.2 mg/dL (ref 8.7–10.3)
Chloride: 102 mmol/L (ref 96–106)
Creatinine, Ser: 0.92 mg/dL (ref 0.57–1.00)
GFR calc Af Amer: 63 mL/min/{1.73_m2} (ref >59)
GFR calc non Af Amer: 55 mL/min/{1.73_m2} — ABNORMAL LOW (ref >59)
Globulin, Total: 2.6 g/dL (ref 1.5–4.5)
Glucose: 82 mg/dL (ref 65–99)
Potassium: 4.2 mmol/L (ref 3.5–5.2)
Sodium: 142 mmol/L (ref 134–144)
Total Protein: 7.1 g/dL (ref 6.0–8.5)

## 2020-10-29 LAB — LIPID PANEL
Chol/HDL Ratio: 2.9 ratio (ref 0.0–4.4)
Cholesterol, Total: 198 mg/dL (ref 100–199)
HDL: 68 mg/dL (ref >39)
LDL Chol Calc (NIH): 113 mg/dL — ABNORMAL HIGH (ref 0–99)
Triglycerides: 94 mg/dL (ref 0–149)
VLDL Cholesterol Cal: 17 mg/dL (ref 5–40)

## 2020-10-29 LAB — VITAMIN B12: Vitamin B-12: 282 pg/mL (ref 232–1245)

## 2020-10-29 LAB — THYROID PANEL WITH TSH
Free Thyroxine Index: 2.1 (ref 1.2–4.9)
T3 Uptake Ratio: 23 % — ABNORMAL LOW (ref 24–39)
T4, Total: 9.1 ug/dL (ref 4.5–12.0)
TSH: 2.51 u[IU]/mL (ref 0.450–4.500)

## 2020-11-07 DIAGNOSIS — H401433 Capsular glaucoma with pseudoexfoliation of lens, bilateral, severe stage: Secondary | ICD-10-CM | POA: Diagnosis not present

## 2020-11-19 ENCOUNTER — Other Ambulatory Visit: Payer: Self-pay

## 2020-11-20 ENCOUNTER — Other Ambulatory Visit: Payer: Self-pay | Admitting: *Deleted

## 2020-11-20 DIAGNOSIS — R0602 Shortness of breath: Secondary | ICD-10-CM

## 2020-11-20 MED ORDER — FLUTICASONE PROPIONATE 50 MCG/ACT NA SUSP: 2.0000 | NASAL | 2 refills | Status: DC | PRN

## 2020-11-21 ENCOUNTER — Encounter: Payer: Self-pay | Admitting: Internal Medicine

## 2020-11-21 ENCOUNTER — Ambulatory Visit: Payer: Medicare PPO | Admitting: Internal Medicine

## 2020-11-21 ENCOUNTER — Other Ambulatory Visit: Payer: Self-pay

## 2020-11-21 VITALS — BP 142/72 | HR 70 | Ht 64.0 in | Wt 110.1 lb

## 2020-11-21 DIAGNOSIS — E0500 Thyrotoxicosis with diffuse goiter without thyrotoxic crisis or storm: Secondary | ICD-10-CM | POA: Diagnosis not present

## 2020-11-21 DIAGNOSIS — E0590 Thyrotoxicosis, unspecified without thyrotoxic crisis or storm: Secondary | ICD-10-CM | POA: Diagnosis not present

## 2020-11-21 NOTE — Progress Notes (Signed)
Name: Valerie [name]  MRN/ DOB: [id number], 1928-02-28    Age/ Sex: 87 y.o., female     PCP: Chevis Pretty, FNP   Reason for Endocrinology Evaluation: hyperthyroidism     Initial Endocrinology Clinic Visit: 01/24/2019    PATIENT IDENTIFIER: Valerie [name] is a 87 y.o., female with a past medical history of HTN, Hyperlipidemia, CHF/CAD and carotid artery stenosis . She has followed with [facility] since 01/24/2019 for consultative assistance with management of her hyperthyroidism.   HISTORICAL SUMMARY: The patient was first diagnosed with hyperthyroidism in 12/2018 with a TSH of 0.179 uIU/mL with an elevated FT4 and T3 during evaluation for near syncope in the ED. She was started on Atenolol and Methimazole at the time.    She follows at  [facility] and would like to have labs there.  She did present to ED with SoB in 01/2019 and was diagnosed with new onset A.Fib    SUBJECTIVE:   During last visit (08/23/2019): Continued  Methimazole 2.5 mg daily    Today (11/21/2020):  Valerie [name] is here for a follow up on hyperthyroidism secondary to graves' disease. She is accompanied by her daughter Venida Jarvis. She has been compliant with methimazole dose 2.5 mg daily     She has had multiple falls due to decrease vision , no fractures  Has glaucoma  Palpiations are improving  Denies diarrhea  No local neck     HISTORY:  Past Medical History:  Past Medical History:  Diagnosis Date  . Anxiety   . Arthritis   . Broken ribs   . Carotid artery disease ([place])   . Cataract   . Collar bone fracture    Right  . Depression   . Diverticulosis of colon with hemorrhage   . Duodenitis   . Esophageal reflux   . Esophageal stricture   . Essential hypertension   . Glaucoma   . Hiatal hernia   . History of kidney stones     x1   . Hyperlipidemia   . IBS (irritable bowel syndrome)   . Palpitations   . UTI (urinary tract infection) March 2015    Past Surgical History:  Past Surgical History:  Procedure Laterality Date  . CHOLECYSTECTOMY    . COLONOSCOPY    . DILATION AND CURETTAGE OF UTERUS    . EYE SURGERY Bilateral    total of 5  . LASER PHOTO ABLATION Right 10/20/2016   Procedure: LASER PHOTO ABLATION;  Surgeon: Hayden Pedro, MD;  Location: [facility];  Service: Ophthalmology;  Laterality: Right;  Headscope laser  . LEFT ROTATOR CUFF REPAIR X2 Left   . PARS PLANA VITRECTOMY  10/20/2016   WITH 25G REMOVAL/SUTURE SECONDARY INTRAOCULAR LENS, GAS FLUID EXCHANGE   . PARS PLANA VITRECTOMY Right 10/20/2016   Procedure: PARS PLANA VITRECTOMY WITH 25G REMOVAL/SUTURE SECONDARY INTRAOCULAR LENS, GAS FLUID EXCHANGE;  Surgeon: Hayden Pedro, MD;  Location: [facility];  Service: Ophthalmology;  Laterality: Right;  . RIGHT ROTATOR CUFF REPAIR X1 Right   . UPPER GASTROINTESTINAL ENDOSCOPY     with dilation    Social History:  reports that she has never smoked. She has never used smokeless tobacco. She reports that she does not drink alcohol and does not use drugs. Family History:  Family History  Problem Relation Age of Onset  . Leukemia Mother   . Colon cancer Father 48  . Kidney cancer Brother   . Bladder Cancer Brother   .  Stroke Sister   . Esophageal cancer Neg Hx   . Stomach cancer Neg Hx      HOME MEDICATIONS: Allergies as of 11/21/2020      Reactions   Aricept [donepezil Hcl] Other (See Comments)   Nightmares, near syncope, weak, decreased appetite.   Lisinopril Other (See Comments)   Hair loss   Sulfa Antibiotics Nausea Only   Sulfasalazine Nausea Only      Medication List       Accurate as of November 21, 2020  9:39 AM. If you have any questions, ask your nurse or doctor.        STOP taking these medications   Azelastine HCl 0.15 % Soln Stopped by: Dorita Sciara, MD   diclofenac Sodium 1 % Gel Commonly known as: Voltaren Stopped by: Dorita Sciara, MD   lidocaine 5 % Commonly known as:  Lidoderm Stopped by: Dorita Sciara, MD   mupirocin ointment 2 % Commonly known as: BACTROBAN Stopped by: Dorita Sciara, MD   silver sulfADIAZINE 1 % cream Commonly known as: SILVADENE Stopped by: Dorita Sciara, MD     TAKE these medications   acetaminophen 325 MG tablet Commonly known as: TYLENOL Take 650 mg by mouth every 6 (six) hours as needed for headache (pain).   amLODipine 10 MG tablet Commonly known as: NORVASC Take 1 tablet (10 mg total) by mouth daily.   carvedilol 3.125 MG tablet Commonly known as: COREG TAKE  (1)  TABLET TWICE A DAY WITH MEALS (BREAKFAST AND SUPPER)   Eliquis 2.5 MG Tabs tablet Generic drug: apixaban TAKE  (1)  TABLET TWICE A DAY.   fluticasone 50 MCG/ACT nasal spray Commonly known as: FLONASE Place 2 sprays into both nostrils as needed for allergies or rhinitis.   losartan 100 MG tablet Commonly known as: COZAAR Take 1 tablet (100 mg total) by mouth daily. What changed: how much to take   methimazole 5 MG tablet Commonly known as: TAPAZOLE Take 0.5 tablets (2.5 mg total) by mouth daily.   nitroGLYCERIN 0.4 MG SL tablet Commonly known as: NITROSTAT PLACE 1 TABLET UNDER THE TONGUE AT ONSET OF CHEST PAIN EVERY 5 MINTUES UP TO 3 TIMES AS NEEDED   pantoprazole 40 MG tablet Commonly known as: PROTONIX TAKE 1 TABLET DAILY   pravastatin 10 MG tablet Commonly known as: PRAVACHOL Take 1 tablet (10 mg total) by mouth daily.   Vitamin D (Ergocalciferol) 1.25 MG (50000 UNIT) Caps capsule Commonly known as: DRISDOL Take 1 capsule (50,000 Units total) by mouth every 7 (seven) days.         DATA REVIEWED:  Results for KANDA, DELUNA (MRN [id number]) as of 11/21/2020 09:44  Ref. Range 10/28/2020 12:57  TSH Latest Ref Range: 0.450 - 4.500 uIU/mL 2.510  Thyroxine (T4) Latest Ref Range: 4.5 - 12.0 ug/dL 9.1     Results for DECKLYN, HORNIK (MRN [id number]) as of 03/22/2019 08:11  Ref. Range 02/06/2019 15:57  Thyrotropin  Receptor Ab Latest Ref Range: 0.00 - 1.75 IU/L 3.12 (H)    ASSESSMENT / PLAN / RECOMMENDATIONS:   1. Hyperthyroidism Secondary to Graves' Disease:  - Pt is clinically euthyroid - No local neck symptoms.  - No side effects to methimazole - Repeat TFT's are normal, no change will be made    Medications  Methimazole 2.5 mg daily    2. Graves' Disease:   - No extra-thyroidal manifestations of graves' disease.     F/u in 6 months  Signed electronically by: Mack Guise, MD  [facility]  [facility] [address] [place], [place] [address] Phone: [phone] FAX: [phone]      CC: Chevis Pretty, [place] [address] Phone: [phone]  Fax: [phone]   Return to Endocrinology clinic as below: Future Appointments  Date Time Provider [place]  12/06/2020 11:40 AM Nahser, Wonda Cheng, MD [facility]  01/24/2021 10:15 AM Chevis Pretty, FNP [facility] None

## 2020-11-21 NOTE — Patient Instructions (Signed)
We recommend that you follow these hyperthyroidism instructions at home:  1) Take Methimazole 2.5 mg once a day  If you develop severe sore throat with high fevers OR develop unexplained yellowing of your skin, eyes, under your tongue, severe abdominal pain with nausea or vomiting --> then please get evaluated immediately.

## 2020-11-25 ENCOUNTER — Telehealth: Payer: Medicare PPO | Admitting: Emergency Medicine

## 2020-11-25 ENCOUNTER — Ambulatory Visit: Payer: Medicare PPO | Admitting: Family

## 2020-11-25 ENCOUNTER — Encounter: Payer: Self-pay | Admitting: Family

## 2020-11-25 DIAGNOSIS — R300 Dysuria: Secondary | ICD-10-CM

## 2020-11-25 DIAGNOSIS — R399 Unspecified symptoms and signs involving the genitourinary system: Secondary | ICD-10-CM | POA: Diagnosis not present

## 2020-11-25 MED ORDER — CEPHALEXIN 500 MG PO CAPS: 500.0000 mg | ORAL_CAPSULE | Freq: Two times a day (BID) | ORAL | 0 refills | Status: DC

## 2020-11-25 NOTE — Progress Notes (Signed)
   Virtual Visit via telephone Note Due to COVID-19 pandemic this visit was conducted virtually. This visit type was conducted due to national recommendations for restrictions regarding the COVID-19 Pandemic (e.g. social distancing, sheltering in place) in an effort to limit this patient's exposure and mitigate transmission in our community. All issues noted in this document were discussed and addressed.  A physical exam was not performed with this format.  I connected with Valerie [name] on 11/25/20 at 1:04 pm  by telephone and verified that I am speaking with the correct person using two identifiers. Valerie [name] is currently located at home and no one  is currently with her during visit. The provider, Evelina Dun, FNP is located in their office at time of visit.  I discussed the limitations, risks, security and privacy concerns of performing an evaluation and management service by telephone and the availability of in person appointments. I also discussed with the patient that there may be a patient responsible charge related to this service. The patient expressed understanding and agreed to proceed.   History and Present Illness:  Urinary Frequency  This is a new problem. The current episode started yesterday. The problem occurs intermittently. The problem has been waxing and waning. The quality of the pain is described as aching. The pain is at a severity of 4/10. The pain is mild. Associated symptoms include flank pain, frequency, hesitancy and urgency. Pertinent negatives include no hematuria, nausea or vomiting. She has tried increased fluids for the symptoms. The treatment provided mild relief.      Review of Systems  Gastrointestinal: Negative for nausea and vomiting.  Genitourinary: Positive for flank pain, frequency, hesitancy and urgency. Negative for hematuria.  All other systems reviewed and are negative.    Observations/Objective: No SOB or distress noted   Assessment and  Plan: 1. UTI symptoms Force fluids AZO over the counter X2 days RTO if symptoms worsen or do not improve  - cephALEXin (KEFLEX) 500 MG capsule; Take 1 capsule (500 mg total) by mouth 2 (two) times daily.  Dispense: 14 capsule; Refill: 0    I discussed the assessment and treatment plan with the patient. The patient was provided an opportunity to ask questions and all were answered. The patient agreed with the plan and demonstrated an understanding of the instructions.   The patient was advised to call back or seek an in-person evaluation if the symptoms worsen or if the condition fails to improve as anticipated.  The above assessment and management plan was discussed with the patient. The patient verbalized understanding of and has agreed to the management plan. Patient is aware to call the clinic if symptoms persist or worsen. Patient is aware when to return to the clinic for a follow-up visit. Patient educated on when it is appropriate to go to the emergency department.   Time call ended:  1:15 pm   I provided 11 minutes of non-face-to-face time during this encounter.    Evelina Dun, FNP

## 2020-11-25 NOTE — Progress Notes (Signed)
Hi Valerie [name],  I am sorry you are not feeling well.  Based on what you shared with me, I feel your condition warrants further evaluation and I recommend that you be seen for a face to face office visit.  Due to your back pain with pain when urinating, I am concerned that a UTI has potentially traveled to your kidneys.   I would feel more comfortable if you saw a medical provider face to face so we can be sure you are receiving the proper treatment.         NOTE: If you entered your credit card information for this eVisit, you will not be charged. You may see a "hold" on your card for the $35 but that hold will drop off and you will not have a charge processed.   If you are having a true medical emergency please call 911.      For an urgent face to face visit, [facility] has five urgent care centers for your convenience:     [facility] [place] Get Driving Directions [phone] [place], [place] [address] . 10 am - 6pm Monday - Friday    [facility]) Get Driving Directions [phone] [address] [place], [place] [address] . 10 am to 8 pm Monday-Friday . 12 pm to 8 pm [facility] Get Driving Directions [phone] [place], [place], [place] [address] . 8 am to 8 pm Monday-Friday . 9 am to 6 pm Saturday . 11 am to 6 pm Sunday     [facility] Get Driving Directions  [phone] [address] [place], [place] [address] . 8 am to 8 pm Monday-Friday . 8 am to 4 pm [facility] [place] Get Driving Directions [phone] [address] [place], [place] [address] . 12 pm to 6 pm Monday-Friday      Your e-visit answers were reviewed by a board certified advanced clinical practitioner to complete your personal care plan.  Thank you  for using e-Visits.  Approximately 5 minutes was spent documenting and reviewing patient's chart.

## 2020-12-06 ENCOUNTER — Ambulatory Visit: Payer: Medicare PPO | Admitting: Cardiovascular Disease

## 2020-12-06 ENCOUNTER — Other Ambulatory Visit: Payer: Self-pay

## 2020-12-06 ENCOUNTER — Encounter: Payer: Self-pay | Admitting: Cardiovascular Disease

## 2020-12-06 VITALS — BP 146/78 | HR 62 | Ht 64.0 in | Wt 109.0 lb

## 2020-12-06 DIAGNOSIS — I6523 Occlusion and stenosis of bilateral carotid arteries: Secondary | ICD-10-CM | POA: Diagnosis not present

## 2020-12-06 DIAGNOSIS — I351 Nonrheumatic aortic (valve) insufficiency: Secondary | ICD-10-CM | POA: Diagnosis not present

## 2020-12-06 DIAGNOSIS — I10 Essential (primary) hypertension: Secondary | ICD-10-CM

## 2020-12-06 DIAGNOSIS — I480 Paroxysmal atrial fibrillation: Secondary | ICD-10-CM | POA: Diagnosis not present

## 2020-12-06 NOTE — Patient Instructions (Signed)

## 2020-12-06 NOTE — Progress Notes (Signed)
Cardiology Office Note   Date:  12/06/2020   ID:  Valerie [name], DOB Oct 05, 1927, MRN [id number]  PCP:  Chevis Pretty, FNP  Cardiologist:  Dr. Acie Fredrickson    No chief complaint on file.    Please note pt was followed by Dr. Mare Ferrari and with his retirement was seen once by Dr. Domenic Polite in [place], but her daughter who is followed by Dr. Acie Fredrickson would like her seen by Dr. Acie Fredrickson.  He has agreed to see pt.  Previous notes from [facility] [place]:  Valerie [name] is a 87 y.o. female who presents for HTN, bradycardia and dizziness.  PMH of HTN, HLD, anxiety, depression, GERD, CAD, glaucoma-underwent intervention approximately 3 months ago, lives alone but has family living next door who provide close supervision, presented to [facility] ED on 06/30/16 with complaints of ongoing blurred vision for several weeks post procedure,? Intermittent dyspnea and arm pain. Along with chest pain that she states is arthritic.  She checks her blood pressures regularly at home and apparently were elevated in the 220/95 range despite compliance with medications. She apparently has not felt well for a couple of days, headache, poor oral intake and visual changes since surgery for which she has recently been seen by her ophthalmologist and has follow-up in 2 months. In the ED, CT head without acute abnormalities, initial blood pressure 220/77. Admitted for hypertensive urgency.  Her troponin was negative.  Hx of neg nuc study in 2015.    She had HR to 46 (though looking back she has been slow before)..  Meds adjusted and plan for outpt echo.  She had complained about SOB but was not clear today she does have at times.   Was with hypokalemia and follow up was with K+ 3.3.    Hx of carotid disease with last check in 09/2015 with 1-39% bil stenosis. To be seen by vascular in Dec.   On her last visit she complained of dizziness.  Her HR was low so I stopped cardizem and added amlodipine.  Also on 48 hour holter she was in SR 56 to 60  with occ episodes of sinus brady brief to 42 and occ PACs and PVCs.  Her echo with EF of 55 to 60% G2DD, mild Aortic regurg. LA mildly dialted. PA pk pressure 37 mmHg.   Today she feels better with less palpitations and less dizziness.  She does complain of hair loss.  This began with amlodipine.  She had hair loss with lisinopril as well.  No chest pain and no SOB.    November 26, 2016:  Ms. Brummond is seen for the first time today.  Seen with Valerie [name] ( sister)  She is a transfer from Dr. Mare Ferrari. She has a history of hypertension and bradycardia.  BP has been up and down.  She takes it at home  BP has been as high as 185 and as low as 125 Goes out to eat once a week .   Does not pay attention to the salt in her food.   Tries to avoid fried foods.    Has rare episodes of palpitations . Perhaps twice a month. Worse when she is lying down  Had a 48 hr monitor  - revealed PACS and PVCs   August 30, 2017:  Seen with Son,  Valerie [name]  Hx of HTN , blood pressure is high today.  She makes no effort to avoid salt. No cardiac issues [place] , broke some ribs  Was in  the hospital recently for HTN , ( felt some eye pressure )  Still eating some canned foods.   March 09, 2018:  Valerie [name] is seen today for follow-up of her hypertension. Has had some swimmyheadedness - has a cold. Has felt poorly for the past for 3 weeks.  Has had a URI for those 3 weeks No syncope   Dec. 11, 2019:   Has a cold today No syncope No dizziness HR is much better off the metoprolol   Dec. 1, 2020  Has felt well until yesterday  Has not felt well today  No fever, no cough. Has some indigestion  Belched lots yesterday  Has been found to have Graves disease since I last saw her .  Has been hyperthyroid and is on Mithimazole   Hospitalized in May, 2020 with acute diastolic congestive heart failure.  She was found to have atrial fibrillation at that time.  He was started on Eliquis.  December 05, 2019  Valerie [name] is seen for  follow up of her PAF and Graves disease .  Seen with her daughter, [name]. Has glaucoma - getting shots  Had a skin cancer on her face  Has some chronic stasis changes  December 06, 2020: Seen with daughter, Valerie [name] is seen today for follow up of her PAF,  Her echocardiogram from April, 2020 she has normal left ventricular systolic function.  She has grade 1 diastolic dysfunction. She has moderate to severe aortic insufficiency. Given her age and frail health, we have treated her conservatively and do not anticipate any surgery for her AI   No cp or dyspnea  Has severe glaucoma ,   Lives up in [place]  No eating as much as she used to  [employer] is 109 lbs  Has mild bilateral carotid artery disease   Past Medical History:  Diagnosis Date  . Anxiety   . Arthritis   . Broken ribs   . Carotid artery disease ([facility])   . Cataract   . Collar bone fracture    Right  . Depression   . Diverticulosis of colon with hemorrhage   . Duodenitis   . Esophageal reflux   . Esophageal stricture   . Essential hypertension   . Glaucoma   . Hiatal hernia   . History of kidney stones     x1   . Hyperlipidemia   . IBS (irritable bowel syndrome)   . Palpitations   . UTI (urinary tract infection) March 2015    Past Surgical History:  Procedure Laterality Date  . CHOLECYSTECTOMY    . COLONOSCOPY    . DILATION AND CURETTAGE OF UTERUS    . EYE SURGERY Bilateral    total of 5  . LASER PHOTO ABLATION Right 10/20/2016   Procedure: LASER PHOTO ABLATION;  Surgeon: Hayden Pedro, MD;  Location: [facility];  Service: Ophthalmology;  Laterality: Right;  Headscope laser  . LEFT ROTATOR CUFF REPAIR X2 Left   . PARS PLANA VITRECTOMY  10/20/2016   WITH 25G REMOVAL/SUTURE SECONDARY INTRAOCULAR LENS, GAS FLUID EXCHANGE   . PARS PLANA VITRECTOMY Right 10/20/2016   Procedure: PARS PLANA VITRECTOMY WITH 25G REMOVAL/SUTURE SECONDARY INTRAOCULAR LENS, GAS FLUID EXCHANGE;  Surgeon: Hayden Pedro,  MD;  Location: [facility];  Service: Ophthalmology;  Laterality: Right;  . RIGHT ROTATOR CUFF REPAIR X1 Right   . UPPER GASTROINTESTINAL ENDOSCOPY     with dilation     Current Outpatient Medications  Medication Sig Dispense  Refill  . acetaminophen (TYLENOL) 325 MG tablet Take 650 mg by mouth every 6 (six) hours as needed for headache (pain).    Marland Kitchen amLODipine (NORVASC) 10 MG tablet Take 1 tablet (10 mg total) by mouth daily. 90 tablet 1  . carvedilol (COREG) 3.125 MG tablet TAKE  (1)  TABLET TWICE A DAY WITH MEALS (BREAKFAST AND SUPPER) 180 tablet 1  . cephALEXin (KEFLEX) 500 MG capsule Take 1 capsule (500 mg total) by mouth 2 (two) times daily. 14 capsule 0  . ELIQUIS 2.5 MG TABS tablet TAKE  (1)  TABLET TWICE A DAY. 60 tablet 5  . fluticasone (FLONASE) 50 MCG/ACT nasal spray Place 2 sprays into both nostrils as needed for allergies or rhinitis. 16 g 2  . losartan (COZAAR) 100 MG tablet Take 1 tablet (100 mg total) by mouth daily. 90 tablet 1  . methimazole (TAPAZOLE) 5 MG tablet Take 0.5 tablets (2.5 mg total) by mouth daily. 90 tablet 1  . nitroGLYCERIN (NITROSTAT) 0.4 MG SL tablet PLACE 1 TABLET UNDER THE TONGUE AT ONSET OF CHEST PAIN EVERY 5 MINTUES UP TO 3 TIMES AS NEEDED 25 tablet 0  . pantoprazole (PROTONIX) 40 MG tablet TAKE 1 TABLET DAILY 30 tablet 3  . pravastatin (PRAVACHOL) 10 MG tablet Take 1 tablet (10 mg total) by mouth daily. 90 tablet 1  . Vitamin D, Ergocalciferol, (DRISDOL) 1.25 MG (50000 UNIT) CAPS capsule Take 1 capsule (50,000 Units total) by mouth every 7 (seven) days. 12 capsule 0   No current facility-administered medications for this visit.    Allergies:   Aricept [donepezil hcl], Lisinopril, Sulfa antibiotics, and Sulfasalazine    Social History:  The patient  reports that she has never smoked. She has never used smokeless tobacco. She reports that she does not drink alcohol and does not use drugs.   Family History:  The patient's family history includes Bladder  Cancer in her brother; Colon cancer (age of onset: 22) in her father; Kidney cancer in her brother; Leukemia in her mother; Stroke in her sister.    Review of systems:    As per current history.  Otherwise the review of systems is negative.  Physical Exam: Height 5\' 4"  (1.626 m).  GEN:  Elderly , frail female,  NAD  HEENT: Normal NECK: No JVD; No carotid bruits LYMPHATICS: No lymphadenopathy CARDIAC: RRR, soft systolic and diastolic murmur  RESPIRATORY:  Clear to auscultation without rales, wheezing or rhonchi  ABDOMEN: Soft, non-tender, non-distended MUSCULOSKELETAL:  No edema; No deformity  SKIN: Warm and dry NEUROLOGIC:  Alert and oriented x 3    EKG:     December 06, 2020:  NSR , no ST or T wave changes.   Recent Labs: 10/28/2020: ALT 12; BUN 17; Creatinine, Ser 0.92; Hemoglobin 14.0; Platelets 367; Potassium 4.2; Sodium 142; TSH 2.510   Lipid Panel    Component Value Date/Time   CHOL 198 10/28/2020 1257   TRIG 94 10/28/2020 1257   HDL 68 10/28/2020 1257   CHOLHDL 2.9 10/28/2020 1257   CHOLHDL 4 12/05/2014 0946   VLDL 26.0 12/05/2014 0946   LDLCALC 113 (H) 10/28/2020 1257     Other studies Reviewed: Additional studies/ records that were reviewed today include:  Echo 07/31/16 Study Conclusions  - Left ventricle: The cavity size was normal. There was mild focal   basal hypertrophy of the septum. Systolic function was normal.   The estimated ejection fraction was in the range of 55% to 60%.   Wall  motion was normal; there were no regional wall motion   abnormalities. Features are consistent with a pseudonormal left   ventricular filling pattern, with concomitant abnormal relaxation   and increased filling pressure (grade 2 diastolic dysfunction). - Aortic valve: There was mild regurgitation. - Mitral valve: Calcified annulus. There was mild regurgitation. - Left atrium: The atrium was mildly dilated. - Pulmonary arteries: Systolic pressure was mildly increased. PA    peak pressure: 37 mm Hg (S).   ASSESSMENT AND PLAN:  1.  HTN:     BP is failry well controlled   2.  Paroxysmal atrial fibrillation:    She remains in NSR   3. Aortic Insufficiency :    Valve sounds the same . She is very frail,   She is asymptomatic. No indicatinons for surgery at this point but   She is not a very good candidate for any surgical procedure.  Will cont with medical management    4. Hyperlipidemia:  Labs look stable  She had labs at her primary medical doctor's office on October 28, 2020: Total cholesterol is 198 HDL is 68 LDL is 113 Triglyceride level is 94.  These look fine.  Continue current medications.   4. Carotid disease:      has stable carotid disease.  Asymptomatic   5.  Generalized fatigue.    Current medicines are reviewed with the patient today.  The patient Has no concerns regarding medicines.  The following changes have been made:  See above Labs/ tests ordered today include:see above    Signed, Mertie Moores, MD  12/06/2020 11:46 AM    [facility] [place], [place], [place], [place] Phone: [phone]; Fax: [phone]

## 2020-12-09 ENCOUNTER — Ambulatory Visit: Payer: Medicare PPO | Admitting: Nurse Practitioner

## 2020-12-09 ENCOUNTER — Other Ambulatory Visit: Payer: Self-pay

## 2020-12-09 ENCOUNTER — Encounter: Payer: Self-pay | Admitting: Nurse Practitioner

## 2020-12-09 VITALS — BP 164/67 | HR 68 | Temp 97.5°F | Ht 64.0 in | Wt 107.2 lb

## 2020-12-09 DIAGNOSIS — S81802A Unspecified open wound, left lower leg, initial encounter: Secondary | ICD-10-CM | POA: Insufficient documentation

## 2020-12-09 NOTE — Patient Instructions (Signed)
Skin Tear A skin tear is a wound in which the top layers of skin peel off. This is a common problem for older people. It can also be a problem for people who take certain medicines for too long. To repair the skin, your doctor may use:  Tape.  Skin tape (adhesive) strips. A bandage (dressing) may also be placed over the tape or skin tape strips. Follow these instructions at home: Keep your wound clean  Clean the wound as told by your doctor. You may be told to keep the wound dry for the first few days. If you are told to clean the wound: ? Wash the wound with mild soap and water, a wound cleanser, or a salt-water (saline) solution. ? If you use soap, rinse the wound with water to get all the soap off. ? Do not rub the wound dry. Pat the wound gently with a clean towel or let it air-dry.  Change any bandage as told by your doctor. This includes changing the bandage if it gets wet, gets dirty, or starts to smell bad. To change your bandage: ? Wash your hands with soap and water for at least 20 seconds before and after you change your bandage. If you cannot use soap and water, use hand sanitizer. ? Leave tape or skin tape strips alone unless you are told to take them off. You may trim the edges of the tape strips if they curl up. Watch for signs of infection Check your wound every day for signs of infection. Check for:  Redness, swelling, or pain.  More fluid or blood.  Warmth.  Pus or a bad smell.   Protect your wound  Do not scratch or pick at the wound.  Protect the injured area until it has healed. Medicines  Take or apply over-the-counter and prescription medicines only as told by your doctor.  If you were prescribed an antibiotic medicine, take or apply it as told by your doctor. Do not stop using it even if your condition gets better. General instructions  Keep the bandage dry.  Do not take baths, swim, use a hot tub, or do anything that puts your wound underwater. Ask  your doctor about taking showers or sponge baths.  Keep all follow-up visits.   Contact a doctor if:  You have any of these signs of infection in your wound: ? Redness, swelling, or pain. ? More fluid or blood. ? Warmth. ? Pus or a bad smell. Get help right away if:  You have a red streak that goes away from the skin tear.  You have a fever and chills, and your symptoms get worse all of a sudden. Summary  A skin tear is a wound in which the top layers of skin peel off.  To repair the skin, your doctor may use tape or skin tape strips.  Change any bandage as told by your doctor.  Take or apply over-the-counter and prescription medicines only as told by your doctor.  Contact a doctor if you have signs of infection. This information is not intended to replace advice given to you by your health care provider. Make sure you discuss any questions you have with your health care provider. Document Revised: 12/20/2019 Document Reviewed: 12/20/2019 Elsevier Patient Education  2021 [employer].  

## 2020-12-09 NOTE — Progress Notes (Signed)
Acute Office Visit  Subjective:    Patient ID: Valerie [name], female    DOB: 1928/05/16, 87 y.o.   MRN: [id number]  Chief Complaint  Patient presents with  . Leg Swelling    Right lower leg. Patient thinks she had a blood clot that burst lastnight.   . Open Wound    HPI Patient is a 87 year old female who presents to clinic for left lower leg extremity skin tear.  Patient is unable to explain how incident occurred but insists she had a blood clot.    Past Medical History:  Diagnosis Date  . Anxiety   . Arthritis   . Broken ribs   . Carotid artery disease ([place])   . Cataract   . Collar bone fracture    Right  . Depression   . Diverticulosis of colon with hemorrhage   . Duodenitis   . Esophageal reflux   . Esophageal stricture   . Essential hypertension   . Glaucoma   . Hiatal hernia   . History of kidney stones     x1   . Hyperlipidemia   . IBS (irritable bowel syndrome)   . Palpitations   . UTI (urinary tract infection) March 2015    Past Surgical History:  Procedure Laterality Date  . CHOLECYSTECTOMY    . COLONOSCOPY    . DILATION AND CURETTAGE OF UTERUS    . EYE SURGERY Bilateral    total of 5  . LASER PHOTO ABLATION Right 10/20/2016   Procedure: LASER PHOTO ABLATION;  Surgeon: Hayden Pedro, MD;  Location: [facility];  Service: Ophthalmology;  Laterality: Right;  Headscope laser  . LEFT ROTATOR CUFF REPAIR X2 Left   . PARS PLANA VITRECTOMY  10/20/2016   WITH 25G REMOVAL/SUTURE SECONDARY INTRAOCULAR LENS, GAS FLUID EXCHANGE   . PARS PLANA VITRECTOMY Right 10/20/2016   Procedure: PARS PLANA VITRECTOMY WITH 25G REMOVAL/SUTURE SECONDARY INTRAOCULAR LENS, GAS FLUID EXCHANGE;  Surgeon: Hayden Pedro, MD;  Location: [facility];  Service: Ophthalmology;  Laterality: Right;  . RIGHT ROTATOR CUFF REPAIR X1 Right   . UPPER GASTROINTESTINAL ENDOSCOPY     with dilation    Family History  Problem Relation Age of Onset  . Leukemia Mother   . Colon cancer Father 44  . Kidney  cancer Brother   . Bladder Cancer Brother   . Stroke Sister   . Esophageal cancer Neg Hx   . Stomach cancer Neg Hx     Social History   Socioeconomic History  . Marital status: Widowed    Spouse name: Not on file  . Number of children: 4  . Years of education: Not on file  . Highest education level: Not on file  Occupational History  . Occupation: retired    Fish farm manager: RETIRED  Tobacco Use  . Smoking status: Never Smoker  . Smokeless tobacco: Never Used  Vaping Use  . Vaping Use: Never used  Substance and Sexual Activity  . Alcohol use: No  . Drug use: No  . Sexual activity: Not on file  Other Topics Concern  . Not on file  Social History Narrative  . Not on file   Social Determinants of Health   Financial Resource Strain: Not on file  Food Insecurity: Not on file  Transportation Needs: Not on file  Physical Activity: Not on file  Stress: Not on file  Social Connections: Not on file  Intimate Partner Violence: Not on file    Outpatient Medications Prior to  Visit  Medication Sig Dispense Refill  . acetaminophen (TYLENOL) 325 MG tablet Take 650 mg by mouth every 6 (six) hours as needed for headache (pain).    Marland Kitchen amLODipine (NORVASC) 10 MG tablet Take 1 tablet (10 mg total) by mouth daily. 90 tablet 1  . carvedilol (COREG) 3.125 MG tablet TAKE  (1)  TABLET TWICE A DAY WITH MEALS (BREAKFAST AND SUPPER) 180 tablet 1  . ELIQUIS 2.5 MG TABS tablet TAKE  (1)  TABLET TWICE A DAY. 60 tablet 5  . fluticasone (FLONASE) 50 MCG/ACT nasal spray Place 2 sprays into both nostrils as needed for allergies or rhinitis. 16 g 2  . losartan (COZAAR) 100 MG tablet Take 1 tablet (100 mg total) by mouth daily. 90 tablet 1  . methimazole (TAPAZOLE) 5 MG tablet Take 0.5 tablets (2.5 mg total) by mouth daily. 90 tablet 1  . nitroGLYCERIN (NITROSTAT) 0.4 MG SL tablet PLACE 1 TABLET UNDER THE TONGUE AT ONSET OF CHEST PAIN EVERY 5 MINTUES UP TO 3 TIMES AS NEEDED 25 tablet 0  . pantoprazole  (PROTONIX) 40 MG tablet TAKE 1 TABLET DAILY 30 tablet 3  . pravastatin (PRAVACHOL) 10 MG tablet Take 1 tablet (10 mg total) by mouth daily. 90 tablet 1  . Vitamin D, Ergocalciferol, (DRISDOL) 1.25 MG (50000 UNIT) CAPS capsule Take 1 capsule (50,000 Units total) by mouth every 7 (seven) days. 12 capsule 0  . cephALEXin (KEFLEX) 500 MG capsule Take 1 capsule (500 mg total) by mouth 2 (two) times daily. 14 capsule 0   No facility-administered medications prior to visit.    Allergies  Allergen Reactions  . Aricept [Donepezil Hcl] Other (See Comments)    Nightmares, near syncope, weak, decreased appetite.  . Lisinopril Other (See Comments)    Hair loss  . Sulfa Antibiotics Nausea Only  . Sulfasalazine Nausea Only    Review of Systems  HENT: Negative.   Eyes: Negative.   Respiratory: Negative.   Cardiovascular: Negative.   Gastrointestinal: Negative.   Genitourinary: Negative.   Skin: Positive for color change and wound.  All other systems reviewed and are negative.      Objective:    Physical Exam Vitals reviewed.  Constitutional:      Appearance: Normal appearance. She is well-groomed.     Interventions: Face mask in place.       Comments: Wound bed fresh with no signs/symptoms of infection.  Wound measuring 4 inches length by 2 inches wide.  HENT:     Head: Normocephalic.     Nose: Nose normal.  Cardiovascular:     Rate and Rhythm: Normal rate.     Pulses: Normal pulses.     Heart sounds: Normal heart sounds.  Pulmonary:     Effort: Pulmonary effort is normal.     Breath sounds: Normal breath sounds.  Abdominal:     General: Bowel sounds are normal.  Skin:    General: Skin is warm.     Findings: Erythema present.  Neurological:     Mental Status: She is alert.  Psychiatric:        Behavior: Behavior is cooperative.     BP (!) 164/67   Pulse 68   Temp (!) 97.5 F (36.4 C) (Temporal)   Ht 5\' 4"  (1.626 m)   Wt 107 lb 3.2 oz (48.6 kg)   SpO2 98%   BMI  18.40 kg/m  Wt Readings from Last 3 Encounters:  12/09/20 107 lb 3.2 oz (48.6 kg)  12/06/20  109 lb (49.4 kg)  11/21/20 110 lb 2 oz (50 kg)    Health Maintenance Due  Topic Date Due  . COVID-19 Vaccine (1) Never done    There are no preventive care reminders to display for this patient.   Lab Results  Component Value Date   TSH 2.510 10/28/2020   Lab Results  Component Value Date   WBC 7.6 10/28/2020   HGB 14.0 10/28/2020   HCT 40.7 10/28/2020   MCV 97 10/28/2020   PLT 367 10/28/2020   Lab Results  Component Value Date   NA 142 10/28/2020   K 4.2 10/28/2020   CO2 21 10/28/2020   GLUCOSE 82 10/28/2020   BUN 17 10/28/2020   CREATININE 0.92 10/28/2020   BILITOT 0.6 10/28/2020   ALKPHOS 81 10/28/2020   AST 22 10/28/2020   ALT 12 10/28/2020   PROT 7.1 10/28/2020   ALBUMIN 4.5 10/28/2020   CALCIUM 10.2 10/28/2020   ANIONGAP 15 02/19/2019   GFR 84.54 12/05/2014   Lab Results  Component Value Date   CHOL 198 10/28/2020   Lab Results  Component Value Date   HDL 68 10/28/2020   Lab Results  Component Value Date   LDLCALC 113 (H) 10/28/2020   Lab Results  Component Value Date   TRIG 94 10/28/2020   Lab Results  Component Value Date   CHOLHDL 2.9 10/28/2020   No results found for: HGBA1C     Assessment & Plan:   Problem List Items Addressed This Visit      Other   Wound of left leg - Primary    Wound on left leg occurred last 24 hours, cleaned and dressed with Vaseline gauze, follow-up in 1 week.          No orders of the defined types were placed in this encounter.    Ivy Lynn, NP

## 2020-12-09 NOTE — Assessment & Plan Note (Signed)
Wound on left leg occurred last 24 hours, cleaned and dressed with Vaseline gauze, follow-up in 1 week.

## 2020-12-17 ENCOUNTER — Ambulatory Visit: Payer: Medicare PPO | Admitting: Nurse Practitioner

## 2020-12-23 ENCOUNTER — Other Ambulatory Visit: Payer: Self-pay | Admitting: Cardiovascular Disease

## 2020-12-23 NOTE — Telephone Encounter (Signed)
Pt last saw Dr Acie Fredrickson 12/06/20, last labs 10/28/20 Creat 0.92, age 87, weight 48.6kg, based on specified criteria pt is on appropriate dosage of Eliquis 2.5mg  BID.  Will refill rx.

## 2021-01-18 DIAGNOSIS — H40033 Anatomical narrow angle, bilateral: Secondary | ICD-10-CM | POA: Diagnosis not present

## 2021-01-18 DIAGNOSIS — H2513 Age-related nuclear cataract, bilateral: Secondary | ICD-10-CM | POA: Diagnosis not present

## 2021-01-24 ENCOUNTER — Ambulatory Visit (INDEPENDENT_AMBULATORY_CARE_PROVIDER_SITE_OTHER): Payer: Medicare PPO | Admitting: Nurse Practitioner

## 2021-01-24 ENCOUNTER — Telehealth: Payer: Self-pay

## 2021-01-24 ENCOUNTER — Other Ambulatory Visit: Payer: Self-pay

## 2021-01-24 ENCOUNTER — Encounter: Payer: Self-pay | Admitting: Nurse Practitioner

## 2021-01-24 VITALS — BP 142/76 | HR 62 | Temp 96.6°F | Resp 20 | Ht 64.0 in | Wt 109.0 lb

## 2021-01-24 DIAGNOSIS — R5383 Other fatigue: Secondary | ICD-10-CM

## 2021-01-24 DIAGNOSIS — I10 Essential (primary) hypertension: Secondary | ICD-10-CM

## 2021-01-24 DIAGNOSIS — I119 Hypertensive heart disease without heart failure: Secondary | ICD-10-CM | POA: Diagnosis not present

## 2021-01-24 DIAGNOSIS — E538 Deficiency of other specified B group vitamins: Secondary | ICD-10-CM

## 2021-01-24 DIAGNOSIS — I480 Paroxysmal atrial fibrillation: Secondary | ICD-10-CM | POA: Diagnosis not present

## 2021-01-24 DIAGNOSIS — D509 Iron deficiency anemia, unspecified: Secondary | ICD-10-CM

## 2021-01-24 DIAGNOSIS — E782 Mixed hyperlipidemia: Secondary | ICD-10-CM | POA: Diagnosis not present

## 2021-01-24 DIAGNOSIS — K222 Esophageal obstruction: Secondary | ICD-10-CM

## 2021-01-24 DIAGNOSIS — E0590 Thyrotoxicosis, unspecified without thyrotoxic crisis or storm: Secondary | ICD-10-CM | POA: Diagnosis not present

## 2021-01-24 DIAGNOSIS — I509 Heart failure, unspecified: Secondary | ICD-10-CM

## 2021-01-24 DIAGNOSIS — K2900 Acute gastritis without bleeding: Secondary | ICD-10-CM

## 2021-01-24 DIAGNOSIS — K5730 Diverticulosis of large intestine without perforation or abscess without bleeding: Secondary | ICD-10-CM | POA: Diagnosis not present

## 2021-01-24 MED ORDER — APIXABAN 2.5 MG PO TABS: ORAL_TABLET | ORAL | 1 refills | Status: DC

## 2021-01-24 MED ORDER — PRAVASTATIN SODIUM 10 MG PO TABS: 10.0000 mg | ORAL_TABLET | Freq: Every day | ORAL | 1 refills | Status: DC

## 2021-01-24 MED ORDER — PANTOPRAZOLE SODIUM 40 MG PO TBEC: 1.0000 | DELAYED_RELEASE_TABLET | Freq: Every day | ORAL | 1 refills | Status: DC

## 2021-01-24 MED ORDER — AMLODIPINE BESYLATE 10 MG PO TABS: 10.0000 mg | ORAL_TABLET | Freq: Every day | ORAL | 1 refills | Status: DC

## 2021-01-24 MED ORDER — LOSARTAN POTASSIUM 100 MG PO TABS: 100.0000 mg | ORAL_TABLET | Freq: Every day | ORAL | 1 refills | Status: DC

## 2021-01-24 MED ORDER — CARVEDILOL 3.125 MG PO TABS: ORAL_TABLET | ORAL | 1 refills | Status: DC

## 2021-01-24 NOTE — Patient Instructions (Signed)
Vitamin B12 Deficiency Vitamin B12 deficiency occurs when the body does not have enough vitamin B12, which is an important vitamin. The body needs this vitamin:  To make red blood cells.  To make DNA. This is the genetic material inside cells.  To help the nerves work properly so they can carry messages from the brain to the body. Vitamin B12 deficiency can cause various health problems, such as a low red blood cell count (anemia) or nerve damage. What are the causes? This condition may be caused by:  Not eating enough foods that contain vitamin B12.  Not having enough stomach acid and digestive fluids to properly absorb vitamin B12 from the food that you eat.  Certain digestive system diseases that make it hard to absorb vitamin B12. These diseases include Crohn's disease, chronic pancreatitis, and cystic fibrosis.  A condition in which the body does not make enough of a protein (intrinsic factor), resulting in too few red blood cells (pernicious anemia).  Having a surgery in which part of the stomach or small intestine is removed.  Taking certain medicines that make it hard for the body to absorb vitamin B12. These medicines include: ? Heartburn medicines (antacids and proton pump inhibitors). ? Certain antibiotic medicines. ? Some medicines that are used to treat diabetes, tuberculosis, gout, or high cholesterol. What increases the risk? The following factors may make you more likely to develop a B12 deficiency:  Being older than age 50.  Eating a vegetarian or vegan diet, especially while you are pregnant.  Eating a poor diet while you are pregnant.  Taking certain medicines.  Having alcoholism. What are the signs or symptoms? In some cases, there are no symptoms of this condition. If the condition leads to anemia or nerve damage, various symptoms can occur, such as:  Weakness.  Fatigue.  Loss of appetite.  Weight loss.  Numbness or tingling in your hands and  feet.  Redness and burning of the tongue.  Confusion or memory problems.  Depression.  Sensory problems, such as color blindness, ringing in the ears, or loss of taste.  Diarrhea or constipation.  Trouble walking. If anemia is severe, symptoms can include:  Shortness of breath.  Dizziness.  Rapid heart rate (tachycardia). How is this diagnosed? This condition may be diagnosed with a blood test to measure the level of vitamin B12 in your blood. You may also have other tests, including:  A group of tests that measure certain characteristics of blood cells (complete blood count, CBC).  A blood test to measure intrinsic factor.  A procedure where a thin tube with a camera on the end is used to look into your stomach or intestines (endoscopy). Other tests may be needed to discover the cause of B12 deficiency. How is this treated? Treatment for this condition depends on the cause. This condition may be treated by:  Changing your eating and drinking habits, such as: ? Eating more foods that contain vitamin B12. ? Drinking less alcohol or no alcohol.  Getting vitamin B12 injections.  Taking vitamin B12 supplements. Your health care provider will tell you which dosage is best for you. Follow these instructions at home: Eating and drinking  Eat lots of healthy foods that contain vitamin B12, including: ? Meats and poultry. This includes beef, pork, chicken, turkey, and organ meats, such as liver. ? Seafood. This includes clams, rainbow trout, salmon, tuna, and haddock. ? Eggs. ? Cereal and dairy products that are fortified. This means that vitamin B12 has   been added to the food. Check the label on the package to see if the food is fortified. The items listed above may not be a complete list of recommended foods and beverages. Contact a dietitian for more information.   General instructions  Get any injections that are prescribed by your health care provider.  Take  supplements only as told by your health care provider. Follow the directions carefully.  Do not drink alcohol if your health care provider tells you not to. In some cases, you may only be asked to limit alcohol use.  Keep all follow-up visits as told by your health care provider. This is important. Contact a health care provider if:  Your symptoms come back. Get help right away if you:  Develop shortness of breath.  Have a rapid heart rate.  Have chest pain.  Become dizzy or lose consciousness. Summary  Vitamin B12 deficiency occurs when the body does not have enough vitamin B12.  The main causes of vitamin B12 deficiency include dietary deficiency, digestive diseases, pernicious anemia, and having a surgery in which part of the stomach or small intestine is removed.  In some cases, there are no symptoms of this condition. If the condition leads to anemia or nerve damage, various symptoms can occur, such as weakness, shortness of breath, and numbness.  Treatment may include getting vitamin B12 injections or taking vitamin B12 supplements. Eat lots of healthy foods that contain vitamin B12. This information is not intended to replace advice given to you by your health care provider. Make sure you discuss any questions you have with your health care provider. Document Revised: 03/03/2019 Document Reviewed: 05/24/2018 Elsevier Patient Education  2021 Elsevier Inc.  

## 2021-01-24 NOTE — Progress Notes (Signed)
Subjective:    Patient ID: Valerie [name], female    DOB: 1928-08-15, 87 y.o.   MRN: [id number]   Chief Complaint: medical management of chronic issues     HPI:  1. Benign hypertensive heart disease without heart failure No c/o chest pain, sob or headache. She does not check her blood pressure at home. BP Readings from Last 3 Encounters:  01/24/21 (!) 142/76  12/09/20 (!) 164/67  12/06/20 (!) 146/78    2. Congestive heart failure, unspecified HF chronicity, unspecified heart failure type ([place]) 3. Paroxysmal atrial fibrillation ([place]) Last saw cardiology on 12/06/20. acccording to note no changes were made to plan of care. She was in NSR at her appointment.  4. Diverticulosis of colon Patient denies any recent flare ups  5. Stricture esophagus denies any problems swallowing or choking  6. Hyperthyroidism no problems tat she is aware of. She saw DR. Shamleffer 11/21/20. acccording to office  Note she was to remain on her current dose of methimazole.  7. Iron deficiency anemia, unspecified iron deficiency anemia type Always feels fatigued Lab Results  Component Value Date   HGB 14.0 10/28/2020     8. Vitamin B12 deficiency Lab Results  Component Value Date   VITAMINB12 282 10/28/2020     9. Mixed hyperlipidemia Does not watch diet and does no exercise. Lab Results  Component Value Date   CHOL 198 10/28/2020   HDL 68 10/28/2020   LDLCALC 113 (H) 10/28/2020   TRIG 94 10/28/2020   CHOLHDL 2.9 10/28/2020     10. Other fatigue Will recheck labs today.    Outpatient Encounter Medications as of 01/24/2021  Medication Sig  . ELIQUIS 2.5 MG TABS tablet TAKE  (1)  TABLET TWICE A DAY.  Marland Kitchen acetaminophen (TYLENOL) 325 MG tablet Take 650 mg by mouth every 6 (six) hours as needed for headache (pain).  Marland Kitchen amLODipine (NORVASC) 10 MG tablet Take 1 tablet (10 mg total) by mouth daily.  . carvedilol (COREG) 3.125 MG tablet TAKE  (1)  TABLET TWICE A DAY WITH MEALS (BREAKFAST AND  SUPPER)  . fluticasone (FLONASE) 50 MCG/ACT nasal spray Place 2 sprays into both nostrils as needed for allergies or rhinitis.  Marland Kitchen losartan (COZAAR) 100 MG tablet Take 1 tablet (100 mg total) by mouth daily.  . methimazole (TAPAZOLE) 5 MG tablet Take 0.5 tablets (2.5 mg total) by mouth daily.  . nitroGLYCERIN (NITROSTAT) 0.4 MG SL tablet PLACE 1 TABLET UNDER THE TONGUE AT ONSET OF CHEST PAIN EVERY 5 MINTUES UP TO 3 TIMES AS NEEDED  . pantoprazole (PROTONIX) 40 MG tablet TAKE 1 TABLET DAILY  . pravastatin (PRAVACHOL) 10 MG tablet Take 1 tablet (10 mg total) by mouth daily.  . Vitamin D, Ergocalciferol, (DRISDOL) 1.25 MG (50000 UNIT) CAPS capsule Take 1 capsule (50,000 Units total) by mouth every 7 (seven) days.   No facility-administered encounter medications on file as of 01/24/2021.    Past Surgical History:  Procedure Laterality Date  . CHOLECYSTECTOMY    . COLONOSCOPY    . DILATION AND CURETTAGE OF UTERUS    . EYE SURGERY Bilateral    total of 5  . LASER PHOTO ABLATION Right 10/20/2016   Procedure: LASER PHOTO ABLATION;  Surgeon: Hayden Pedro, MD;  Location: [facility];  Service: Ophthalmology;  Laterality: Right;  Headscope laser  . LEFT ROTATOR CUFF REPAIR X2 Left   . PARS PLANA VITRECTOMY  10/20/2016   WITH 25G REMOVAL/SUTURE SECONDARY INTRAOCULAR LENS, GAS FLUID EXCHANGE   .  PARS PLANA VITRECTOMY Right 10/20/2016   Procedure: PARS PLANA VITRECTOMY WITH 25G REMOVAL/SUTURE SECONDARY INTRAOCULAR LENS, GAS FLUID EXCHANGE;  Surgeon: Hayden Pedro, MD;  Location: [facility];  Service: Ophthalmology;  Laterality: Right;  . RIGHT ROTATOR CUFF REPAIR X1 Right   . UPPER GASTROINTESTINAL ENDOSCOPY     with dilation    Family History  Problem Relation Age of Onset  . Leukemia Mother   . Colon cancer Father 27  . Kidney cancer Brother   . Bladder Cancer Brother   . Stroke Sister   . Esophageal cancer Neg Hx   . Stomach cancer Neg Hx     New complaints: None today  Social history: Lives  by herself. Family checks on her daily  Controlled substance contract: n/a    Review of Systems  Constitutional: Positive for fatigue. Negative for diaphoresis.  HENT: Negative.   Eyes: Negative for pain.  Respiratory: Negative for shortness of breath.   Cardiovascular: Negative for chest pain, palpitations and leg swelling.  Gastrointestinal: Negative for abdominal pain.  Endocrine: Negative for polydipsia.  Skin: Negative for rash.  Neurological: Negative for dizziness, weakness and headaches.  Hematological: Does not bruise/bleed easily.  All other systems reviewed and are negative.      Objective:   Physical Exam Vitals and nursing note reviewed.  Constitutional:      General: She is not in acute distress.    Appearance: Normal appearance. She is well-developed.  HENT:     Head: Normocephalic.     Nose: Nose normal.  Eyes:     Pupils: Pupils are equal, round, and reactive to light.  Neck:     Vascular: No carotid bruit or JVD.  Cardiovascular:     Rate and Rhythm: Normal rate and regular rhythm.     Heart sounds: Normal heart sounds.  Pulmonary:     Effort: Pulmonary effort is normal. No respiratory distress.     Breath sounds: Normal breath sounds. No wheezing or rales.  Chest:     Chest wall: No tenderness.  Abdominal:     General: Bowel sounds are normal. There is no distension or abdominal bruit.     Palpations: Abdomen is soft. There is no hepatomegaly, splenomegaly, mass or pulsatile mass.     Tenderness: There is no abdominal tenderness.  Musculoskeletal:        General: Normal range of motion.     Cervical back: Normal range of motion and neck supple.  Lymphadenopathy:     Cervical: No cervical adenopathy.  Skin:    General: Skin is warm and dry.  Neurological:     Mental Status: She is alert and oriented to person, place, and time.     Deep Tendon Reflexes: Reflexes are normal and symmetric.  Psychiatric:        Behavior: Behavior normal.         Thought Content: Thought content normal.        Judgment: Judgment normal.    BP (!) 142/76   Pulse 62   Temp (!) 96.6 F (35.9 C) (Temporal)   Resp 20   Ht _0  (1.626 m)   Wt 109 lb (49.4 kg)   SpO2 99%   BMI 18.71 kg/m         Assessment & Plan:  TINNA KOLKER comes in today with chief complaint of Medical Management of Chronic Issues   Diagnosis and orders addressed:  1. Benign hypertensive heart disease without heart failure Low sodium  diet - CBC with Differential/Platelet - CMP14+EGFR - Lipid panel - amLODipine (NORVASC) 10 MG tablet; Take 1 tablet (10 mg total) by mouth daily.  Dispense: 90 tablet; Refill: 1 - carvedilol (COREG) 3.125 MG tablet; TAKE  (1)  TABLET TWICE A DAY WITH MEALS (BREAKFAST AND SUPPER)  Dispense: 180 tablet; Refill: 1 - losartan (COZAAR) 100 MG tablet; Take 1 tablet (100 mg total) by mouth daily.  Dispense: 90 tablet; Refill: 1  2. Congestive heart failure, unspecified HF chronicity, unspecified heart failure type ([place]) Keep follow up with cardiology - apixaban (ELIQUIS) 2.5 MG TABS tablet; TAKE  (1)  TABLET TWICE A DAY.  Dispense: 180 tablet; Refill: 1  3. Paroxysmal atrial fibrillation ([facility]) Avoid caffeine  4. Diverticulosis of colon Watch diet to prevent flare up  5. Stricture esophagus - pantoprazole (PROTONIX) 40 MG tablet; Take 1 tablet (40 mg total) by mouth daily.  Dispense: 90 tablet; Refill: 1  6. Hyperthyroidism Keep follow up with intrnal medicine  7. Iron deficiency anemia, unspecified iron deficiency anemia type Labs pending  8. Vitamin B12 deficiency Will talk after labs are back about injections of B12 - Vitamin B12  9. Mixed hyperlipidemia Low fat diet - pravastatin (PRAVACHOL) 10 MG tablet; Take 1 tablet (10 mg total) by mouth daily.  Dispense: 90 tablet; Refill: 1  10. Other fatigue  Labs pending Health Maintenance reviewed Diet and exercise encouraged  Follow up plan: 6 months   [place]ie, FNP

## 2021-01-24 NOTE — Telephone Encounter (Signed)
Patients daughter states that mom is taking losartan 100mg  1/2 daily

## 2021-01-24 NOTE — Telephone Encounter (Signed)
Pt had appt this morning with MMM and per pts daughter, pt was told to tell daughter that MMM wanted to speak with her regarding pts medication. Daughter called requesting that MMM call her when able at [phone].

## 2021-01-24 NOTE — Telephone Encounter (Signed)
Which blood pressure med is she taking half of.

## 2021-02-14 DIAGNOSIS — H35351 Cystoid macular degeneration, right eye: Secondary | ICD-10-CM | POA: Diagnosis not present

## 2021-02-18 ENCOUNTER — Other Ambulatory Visit: Payer: Self-pay | Admitting: Internal Medicine

## 2021-03-13 DIAGNOSIS — I10 Essential (primary) hypertension: Secondary | ICD-10-CM | POA: Diagnosis not present

## 2021-03-13 DIAGNOSIS — M79604 Pain in right leg: Secondary | ICD-10-CM | POA: Diagnosis not present

## 2021-03-13 DIAGNOSIS — W130XXA Fall from, out of or through balcony, initial encounter: Secondary | ICD-10-CM | POA: Diagnosis not present

## 2021-03-13 DIAGNOSIS — S72001A Fracture of unspecified part of neck of right femur, initial encounter for closed fracture: Secondary | ICD-10-CM | POA: Diagnosis not present

## 2021-03-13 DIAGNOSIS — R410 Disorientation, unspecified: Secondary | ICD-10-CM | POA: Diagnosis not present

## 2021-03-13 DIAGNOSIS — R918 Other nonspecific abnormal finding of lung field: Secondary | ICD-10-CM | POA: Diagnosis not present

## 2021-03-13 DIAGNOSIS — E785 Hyperlipidemia, unspecified: Secondary | ICD-10-CM | POA: Diagnosis not present

## 2021-03-13 DIAGNOSIS — Z7901 Long term (current) use of anticoagulants: Secondary | ICD-10-CM | POA: Diagnosis not present

## 2021-03-13 DIAGNOSIS — R911 Solitary pulmonary nodule: Secondary | ICD-10-CM | POA: Diagnosis not present

## 2021-03-13 DIAGNOSIS — Z20822 Contact with and (suspected) exposure to covid-19: Secondary | ICD-10-CM | POA: Diagnosis not present

## 2021-03-13 DIAGNOSIS — I4891 Unspecified atrial fibrillation: Secondary | ICD-10-CM | POA: Diagnosis not present

## 2021-03-13 DIAGNOSIS — Z01818 Encounter for other preprocedural examination: Secondary | ICD-10-CM | POA: Diagnosis not present

## 2021-03-13 DIAGNOSIS — M160 Bilateral primary osteoarthritis of hip: Secondary | ICD-10-CM | POA: Diagnosis not present

## 2021-03-13 DIAGNOSIS — W1830XA Fall on same level, unspecified, initial encounter: Secondary | ICD-10-CM | POA: Diagnosis not present

## 2021-03-13 DIAGNOSIS — S59911A Unspecified injury of right forearm, initial encounter: Secondary | ICD-10-CM | POA: Diagnosis not present

## 2021-03-15 ENCOUNTER — Inpatient Hospital Stay (HOSPITAL_COMMUNITY)
Admission: AD | Admit: 2021-03-15 | Discharge: 2021-03-20 | DRG: 522 | Disposition: A | Discharge status: Home Health | Payer: Medicare PPO | Source: Other Acute Inpatient Hospital | Attending: Internal Medicine | Admitting: Internal Medicine

## 2021-03-15 DIAGNOSIS — R531 Weakness: Secondary | ICD-10-CM | POA: Diagnosis not present

## 2021-03-15 DIAGNOSIS — Z96641 Presence of right artificial hip joint: Secondary | ICD-10-CM | POA: Diagnosis not present

## 2021-03-15 DIAGNOSIS — I4891 Unspecified atrial fibrillation: Secondary | ICD-10-CM | POA: Diagnosis not present

## 2021-03-15 DIAGNOSIS — E039 Hypothyroidism, unspecified: Secondary | ICD-10-CM | POA: Diagnosis not present

## 2021-03-15 DIAGNOSIS — K219 Gastro-esophageal reflux disease without esophagitis: Secondary | ICD-10-CM | POA: Diagnosis present

## 2021-03-15 DIAGNOSIS — R911 Solitary pulmonary nodule: Secondary | ICD-10-CM | POA: Diagnosis not present

## 2021-03-15 DIAGNOSIS — Z882 Allergy status to sulfonamides status: Secondary | ICD-10-CM | POA: Diagnosis not present

## 2021-03-15 DIAGNOSIS — I351 Nonrheumatic aortic (valve) insufficiency: Secondary | ICD-10-CM | POA: Diagnosis not present

## 2021-03-15 DIAGNOSIS — I10 Essential (primary) hypertension: Secondary | ICD-10-CM | POA: Diagnosis present

## 2021-03-15 DIAGNOSIS — E785 Hyperlipidemia, unspecified: Secondary | ICD-10-CM | POA: Diagnosis present

## 2021-03-15 DIAGNOSIS — M160 Bilateral primary osteoarthritis of hip: Secondary | ICD-10-CM | POA: Diagnosis not present

## 2021-03-15 DIAGNOSIS — S72009A Fracture of unspecified part of neck of unspecified femur, initial encounter for closed fracture: Secondary | ICD-10-CM

## 2021-03-15 DIAGNOSIS — Z20822 Contact with and (suspected) exposure to covid-19: Secondary | ICD-10-CM | POA: Diagnosis present

## 2021-03-15 DIAGNOSIS — I4892 Unspecified atrial flutter: Secondary | ICD-10-CM | POA: Diagnosis present

## 2021-03-15 DIAGNOSIS — E440 Moderate protein-calorie malnutrition: Secondary | ICD-10-CM | POA: Diagnosis not present

## 2021-03-15 DIAGNOSIS — R918 Other nonspecific abnormal finding of lung field: Secondary | ICD-10-CM | POA: Diagnosis not present

## 2021-03-15 DIAGNOSIS — R410 Disorientation, unspecified: Secondary | ICD-10-CM | POA: Diagnosis not present

## 2021-03-15 DIAGNOSIS — Z888 Allergy status to other drugs, medicaments and biological substances status: Secondary | ICD-10-CM | POA: Diagnosis not present

## 2021-03-15 DIAGNOSIS — F419 Anxiety disorder, unspecified: Secondary | ICD-10-CM | POA: Diagnosis present

## 2021-03-15 DIAGNOSIS — Z823 Family history of stroke: Secondary | ICD-10-CM

## 2021-03-15 DIAGNOSIS — Z66 Do not resuscitate: Secondary | ICD-10-CM | POA: Diagnosis present

## 2021-03-15 DIAGNOSIS — D509 Iron deficiency anemia, unspecified: Secondary | ICD-10-CM | POA: Diagnosis present

## 2021-03-15 DIAGNOSIS — H409 Unspecified glaucoma: Secondary | ICD-10-CM | POA: Diagnosis not present

## 2021-03-15 DIAGNOSIS — W010XXA Fall on same level from slipping, tripping and stumbling without subsequent striking against object, initial encounter: Secondary | ICD-10-CM | POA: Diagnosis present

## 2021-03-15 DIAGNOSIS — Y92002 Bathroom of unspecified non-institutional (private) residence single-family (private) house as the place of occurrence of the external cause: Secondary | ICD-10-CM

## 2021-03-15 DIAGNOSIS — Z471 Aftercare following joint replacement surgery: Secondary | ICD-10-CM | POA: Diagnosis not present

## 2021-03-15 DIAGNOSIS — S72001A Fracture of unspecified part of neck of right femur, initial encounter for closed fracture: Secondary | ICD-10-CM | POA: Diagnosis not present

## 2021-03-15 DIAGNOSIS — E0590 Thyrotoxicosis, unspecified without thyrotoxic crisis or storm: Secondary | ICD-10-CM | POA: Diagnosis not present

## 2021-03-15 DIAGNOSIS — F32A Depression, unspecified: Secondary | ICD-10-CM | POA: Diagnosis present

## 2021-03-15 DIAGNOSIS — I482 Chronic atrial fibrillation, unspecified: Secondary | ICD-10-CM | POA: Diagnosis not present

## 2021-03-15 DIAGNOSIS — E782 Mixed hyperlipidemia: Secondary | ICD-10-CM | POA: Diagnosis not present

## 2021-03-15 DIAGNOSIS — I480 Paroxysmal atrial fibrillation: Secondary | ICD-10-CM | POA: Diagnosis present

## 2021-03-15 DIAGNOSIS — Z96649 Presence of unspecified artificial hip joint: Secondary | ICD-10-CM

## 2021-03-15 MED ORDER — ACETAMINOPHEN 325 MG PO TABS: 650.0000 mg | ORAL_TABLET | Freq: Four times a day (QID) | ORAL | Status: DC | PRN

## 2021-03-15 MED ORDER — PANTOPRAZOLE SODIUM 40 MG PO TBEC
40.0000 mg | DELAYED_RELEASE_TABLET | Freq: Every day | ORAL | Status: DC
  Administered 2021-03-16 – 2021-03-20 (×5): 40 mg via ORAL
  Filled 2021-03-15 (×5): qty 1

## 2021-03-15 MED ORDER — ENOXAPARIN SODIUM 40 MG/0.4ML IJ SOSY
40.0000 mg | PREFILLED_SYRINGE | INTRAMUSCULAR | Status: DC
  Filled 2021-03-15: qty 0.4

## 2021-03-15 MED ORDER — DOCUSATE SODIUM 100 MG PO CAPS
100.0000 mg | ORAL_CAPSULE | Freq: Two times a day (BID) | ORAL | Status: DC
  Administered 2021-03-15 – 2021-03-16 (×2): 100 mg via ORAL
  Filled 2021-03-15 (×2): qty 1

## 2021-03-15 MED ORDER — CARVEDILOL 3.125 MG PO TABS
3.1250 mg | ORAL_TABLET | Freq: Two times a day (BID) | ORAL | Status: DC
  Administered 2021-03-16 – 2021-03-20 (×8): 3.125 mg via ORAL
  Filled 2021-03-15 (×10): qty 1

## 2021-03-15 MED ORDER — PRAVASTATIN SODIUM 10 MG PO TABS
10.0000 mg | ORAL_TABLET | Freq: Every day | ORAL | Status: DC
  Administered 2021-03-15 – 2021-03-19 (×5): 10 mg via ORAL
  Filled 2021-03-15 (×6): qty 1

## 2021-03-15 MED ORDER — AMLODIPINE BESYLATE 10 MG PO TABS
10.0000 mg | ORAL_TABLET | Freq: Every day | ORAL | Status: DC
  Administered 2021-03-15: 10 mg via ORAL
  Filled 2021-03-15 (×2): qty 1

## 2021-03-15 MED ORDER — HYDROCODONE-ACETAMINOPHEN 5-325 MG PO TABS
1.0000 | ORAL_TABLET | Freq: Four times a day (QID) | ORAL | Status: DC | PRN
  Administered 2021-03-15: 1 via ORAL
  Filled 2021-03-15: qty 2

## 2021-03-15 MED ORDER — POLYVINYL ALCOHOL 1.4 % OP SOLN
1.0000 [drp] | OPHTHALMIC | Status: DC | PRN
  Filled 2021-03-15: qty 15

## 2021-03-15 MED ORDER — HYDROMORPHONE HCL 1 MG/ML IJ SOLN: 0.5000 mg | INTRAMUSCULAR | Status: DC | PRN

## 2021-03-15 MED ORDER — POLYETHYLENE GLYCOL 3350 17 G PO PACK: 17.0000 g | PACK | Freq: Every day | ORAL | Status: DC | PRN

## 2021-03-15 MED ORDER — LOSARTAN POTASSIUM 50 MG PO TABS
100.0000 mg | ORAL_TABLET | Freq: Every day | ORAL | Status: DC
  Filled 2021-03-15: qty 2

## 2021-03-15 MED ORDER — FLUTICASONE PROPIONATE 50 MCG/ACT NA SUSP
2.0000 | Freq: Every day | NASAL | Status: DC | PRN
  Filled 2021-03-15: qty 16

## 2021-03-15 MED ORDER — METHIMAZOLE 5 MG PO TABS
5.0000 mg | ORAL_TABLET | Freq: Every day | ORAL | Status: DC
  Administered 2021-03-16 – 2021-03-20 (×5): 5 mg via ORAL
  Filled 2021-03-15 (×5): qty 1

## 2021-03-15 NOTE — H&P (Signed)
History and Physical    Valerie [name] IRW:[id number] DOB: 1927/12/10 DOA: 03/15/2021  PCP: Chevis Pretty, FNP (Confirm with patient/family/NH records and if not entered, this has to be entered at [facility] point of entry) Patient coming from: Home  I have personally briefly reviewed patient's old medical records in [place]  Chief Complaint: I fell and broke my hip  HPI: Valerie [name] is a 87 y.o. female with medical history significant of chronic A. fib/A-flutter on Eliquis, HTN, HLD, hyperthyroid, came with mechanical fall and right hip fracture.  Patient lives with her family, and according to family patient has been active no history of ambulation dysfunction.  2 days ago, patient stepped on a puddle of water in bathroom and fell down on her right hip.  Excruciating pain and unable to support herself up.  Family called EMS to shift patient to ER.  Patient denied any chest pain, prodrome of near syncope before the fall.  ED Course: [facility], x-ray showed acute right femoral neck impacted fracture.  Orthopedic surgery on-call contacted patient transferred to [facility] for ORIF.  Patient  Review of Systems: As per HPI otherwise 14 point review of systems negative.    Past Medical History:  Diagnosis Date   Anxiety    Arthritis    Broken ribs    Carotid artery disease ([place])    Cataract    Collar bone fracture    Right   Depression    Diverticulosis of colon with hemorrhage    Duodenitis    Esophageal reflux    Esophageal stricture    Essential hypertension    Glaucoma    Hiatal hernia    History of kidney stones     x1    Hyperlipidemia    IBS (irritable bowel syndrome)    Palpitations    UTI (urinary tract infection) March 2015    Past Surgical History:  Procedure Laterality Date   CHOLECYSTECTOMY     COLONOSCOPY     DILATION AND CURETTAGE OF UTERUS     EYE SURGERY Bilateral    total of 5   LASER PHOTO ABLATION Right 10/20/2016   Procedure: LASER PHOTO  ABLATION;  Surgeon: Hayden Pedro, MD;  Location: [place];  Service: Ophthalmology;  Laterality: Right;  Headscope laser   LEFT ROTATOR CUFF REPAIR X2 Left    PARS PLANA VITRECTOMY  10/20/2016   WITH 25G REMOVAL/SUTURE SECONDARY INTRAOCULAR LENS, GAS FLUID EXCHANGE    PARS PLANA VITRECTOMY Right 10/20/2016   Procedure: PARS PLANA VITRECTOMY WITH 25G REMOVAL/SUTURE SECONDARY INTRAOCULAR LENS, GAS FLUID EXCHANGE;  Surgeon: Hayden Pedro, MD;  Location: [place];  Service: Ophthalmology;  Laterality: Right;   RIGHT ROTATOR CUFF REPAIR X1 Right    UPPER GASTROINTESTINAL ENDOSCOPY     with dilation     reports that she has never smoked. She has never used smokeless tobacco. She reports that she does not drink alcohol and does not use drugs.  Allergies  Allergen Reactions   Aricept [Donepezil Hcl] Other (See Comments)    Nightmares, near syncope, weak, decreased appetite.   Lisinopril Other (See Comments)    Hair loss   Sulfa Antibiotics Nausea Only   Sulfasalazine Nausea Only    Family History  Problem Relation Age of Onset   Leukemia Mother    Colon cancer Father 85   Kidney cancer Brother    Bladder Cancer Brother    Stroke Sister    Esophageal cancer Neg Hx  Stomach cancer Neg Hx      Prior to Admission medications   Medication Sig Start Date End Date Taking? Authorizing Provider  acetaminophen (TYLENOL) 325 MG tablet Take 650 mg by mouth every 6 (six) hours as needed for headache (pain).   Yes [provider]  amLODipine (NORVASC) 10 MG tablet Take 1 tablet (10 mg total) by mouth daily. 01/24/21  Yes Hassell Done, Mary-Margaret, FNP  apixaban (ELIQUIS) 2.5 MG TABS tablet TAKE  (1)  TABLET TWICE A DAY. Patient taking differently: Take 2.5 mg by mouth 2 (two) times daily. 01/24/21  Yes Martin, Mary-Margaret, FNP  carvedilol (COREG) 3.125 MG tablet TAKE  (1)  TABLET TWICE A DAY WITH MEALS (BREAKFAST AND SUPPER) Patient taking differently: Take 3.125 mg by mouth 2 (two) times  daily with a meal. 01/24/21  Yes Hassell Done, Mary-Margaret, FNP  fluticasone (FLONASE) 50 MCG/ACT nasal spray Place 2 sprays into both nostrils as needed for allergies or rhinitis. 11/20/20  Yes Hassell Done, Mary-Margaret, FNP  losartan (COZAAR) 100 MG tablet Take 1 tablet (100 mg total) by mouth daily. 01/24/21  Yes Martin, Mary-Margaret, FNP  methimazole (TAPAZOLE) 5 MG tablet TAKE 1 TABLET DAILY Patient taking differently: Take 5 mg by mouth daily. 02/18/21  Yes Shamleffer, Melanie Crazier, MD  pantoprazole (PROTONIX) 40 MG tablet Take 1 tablet (40 mg total) by mouth daily. 01/24/21  Yes Martin, Mary-Margaret, FNP  polyvinyl alcohol (LIQUIFILM TEARS) 1.4 % ophthalmic solution Place 1 drop into both eyes as needed for dry eyes.   Yes [provider]  pravastatin (PRAVACHOL) 10 MG tablet Take 1 tablet (10 mg total) by mouth daily. 01/24/21  Yes Martin, Mary-Margaret, FNP  Vitamin D, Ergocalciferol, (DRISDOL) 1.25 MG (50000 UNIT) CAPS capsule Take 1 capsule (50,000 Units total) by mouth every 7 (seven) days. 11/20/19  Yes Hassell Done, Mary-Margaret, FNP  nitroGLYCERIN (NITROSTAT) 0.4 MG SL tablet PLACE 1 TABLET UNDER THE TONGUE AT ONSET OF CHEST PAIN EVERY 5 MINTUES UP TO 3 TIMES AS NEEDED Patient taking differently: Place 0.4 mg under the tongue every 5 (five) minutes as needed for chest pain. 09/16/20   Chevis Pretty, FNP    Physical Exam: Vitals:   03/15/21 1700  BP: (!) 134/54  Pulse: 75  Temp: 98.3 F (36.8 C)  TempSrc: Oral  SpO2: 99%    Constitutional: NAD, calm, comfortable Vitals:   03/15/21 1700  BP: (!) 134/54  Pulse: 75  Temp: 98.3 F (36.8 C)  TempSrc: Oral  SpO2: 99%   Eyes: PERRL, lids and conjunctivae normal ENMT: Mucous membranes are moist. Posterior pharynx clear of any exudate or lesions.Normal dentition.  Neck: normal, supple, no masses, no thyromegaly Respiratory: clear to auscultation bilaterally, no wheezing, no crackles. Normal respiratory effort. No accessory  muscle use.  Cardiovascular: Regular rate and rhythm, soft systolic murmur on heart base. No extremity edema. 2+ pedal pulses. No carotid bruits.  Abdomen: no tenderness, no masses palpated. No hepatosplenomegaly. Bowel sounds positive.  Musculoskeletal: Right leg shortened and rotated.  Skin: no rashes, lesions, ulcers. No induration Neurologic: CN 2-12 grossly intact. Sensation intact, DTR normal. Strength 5/5 in all 4.  Psychiatric: Normal judgment and insight. Alert and oriented x 3. Normal mood.     Labs on Admission: I have personally reviewed following labs and imaging studies  CBC: No results for input(s): WBC, NEUTROABS, HGB, HCT, MCV, PLT in the last 168 hours. Basic Metabolic Panel: No results for input(s): NA, K, CL, CO2, GLUCOSE, BUN, CREATININE, CALCIUM, MG, PHOS in the last  168 hours. GFR: CrCl cannot be calculated (Patient's most recent lab result is older than the maximum 21 days allowed.). Liver Function Tests: No results for input(s): AST, ALT, ALKPHOS, BILITOT, PROT, ALBUMIN in the last 168 hours. No results for input(s): LIPASE, AMYLASE in the last 168 hours. No results for input(s): AMMONIA in the last 168 hours. Coagulation Profile: No results for input(s): INR, PROTIME in the last 168 hours. Cardiac Enzymes: No results for input(s): CKTOTAL, CKMB, CKMBINDEX, TROPONINI in the last 168 hours. BNP (last 3 results) No results for input(s): PROBNP in the last 8760 hours. HbA1C: No results for input(s): HGBA1C in the last 72 hours. CBG: No results for input(s): GLUCAP in the last 168 hours. Lipid Profile: No results for input(s): CHOL, HDL, LDLCALC, TRIG, CHOLHDL, LDLDIRECT in the last 72 hours. Thyroid Function Tests: No results for input(s): TSH, T4TOTAL, FREET4, T3FREE, THYROIDAB in the last 72 hours. Anemia Panel: No results for input(s): VITAMINB12, FOLATE, FERRITIN, TIBC, IRON, RETICCTPCT in the last 72 hours. Urine analysis:    Component Value  Date/Time   COLORURINE YELLOW 01/13/2019 2120   APPEARANCEUR Clear 12/22/2019 0952   LABSPEC 1.014 01/13/2019 2120   PHURINE 6.0 01/13/2019 2120   GLUCOSEU Negative 12/22/2019 0952   HGBUR NEGATIVE 01/13/2019 2120   BILIRUBINUR Negative 12/22/2019 0952   KETONESUR NEGATIVE 01/13/2019 2120   PROTEINUR 1+ (A) 12/22/2019 0952   PROTEINUR NEGATIVE 01/13/2019 2120   UROBILINOGEN negative 11/08/2013 0908   UROBILINOGEN 0.2 11/30/2008 1105   NITRITE Negative 12/22/2019 0952   NITRITE NEGATIVE 01/13/2019 2120   LEUKOCYTESUR 1+ (A) 12/22/2019 0952   LEUKOCYTESUR NEGATIVE 01/13/2019 2120    Radiological Exams on Admission: No results found.  EKG: Ordered  Assessment/Plan Active Problems:   Femoral neck fracture (HCC)   Hip fracture (HCC)  (please populate well all problems here in Problem List. (For example, if patient is on BP meds at home and you resume or decide to hold them, it is a problem that needs to be her. Same for CAD, COPD, HLD and so on)  Right femoral neck fracture secondary to mechanical fall -Discussed with on-call Dr. Lorin Mercy, n.p.o. after midnight, ORIF tomorrow -Pain management -Patient has been active tolerated for 4 METS activity, cleared for tomorrow's ORIF and general anesthesia with acceptable risk.  Explained to patient daughter at bedside who expressed understanding.  Paroxysmal A. Fib -Continue to hold Eliquis -Lovenox subcu for DVT prophylaxis  HTN -Stable, continue home meds  Hyperthyroidism -Continue methimazole  HLD -Statin  DVT prophylaxis: Lovenox Code Status: Temporarily reversed to full code for incoming surgery and switch back to DNR on discharge Family Communication: Daughter at bedside Disposition Plan: Expect 2 to 3 days hospital stay Consults called: Orthopedic surgery Admission status: MedSurg admission   Lequita Halt MD [facility] Pager [phone]  03/15/2021, 7:08 PM

## 2021-03-16 ENCOUNTER — Inpatient Hospital Stay (HOSPITAL_COMMUNITY): Payer: Medicare PPO

## 2021-03-16 ENCOUNTER — Encounter (HOSPITAL_COMMUNITY)
Admission: AD | Disposition: A | Discharge status: Home Health | Payer: Self-pay | Source: Other Acute Inpatient Hospital | Attending: Internal Medicine

## 2021-03-16 ENCOUNTER — Inpatient Hospital Stay (HOSPITAL_COMMUNITY): Payer: Medicare PPO | Admitting: Anesthesiology

## 2021-03-16 ENCOUNTER — Encounter (HOSPITAL_COMMUNITY): Payer: Self-pay | Admitting: Internal Medicine

## 2021-03-16 DIAGNOSIS — E782 Mixed hyperlipidemia: Secondary | ICD-10-CM

## 2021-03-16 DIAGNOSIS — I351 Nonrheumatic aortic (valve) insufficiency: Secondary | ICD-10-CM

## 2021-03-16 DIAGNOSIS — I10 Essential (primary) hypertension: Secondary | ICD-10-CM

## 2021-03-16 DIAGNOSIS — S72001A Fracture of unspecified part of neck of right femur, initial encounter for closed fracture: Secondary | ICD-10-CM | POA: Diagnosis not present

## 2021-03-16 DIAGNOSIS — D509 Iron deficiency anemia, unspecified: Secondary | ICD-10-CM

## 2021-03-16 DIAGNOSIS — H409 Unspecified glaucoma: Secondary | ICD-10-CM

## 2021-03-16 DIAGNOSIS — I480 Paroxysmal atrial fibrillation: Secondary | ICD-10-CM

## 2021-03-16 HISTORY — PX: HIP ARTHROPLASTY: SHX981

## 2021-03-16 LAB — CBC WITH DIFFERENTIAL/PLATELET
Abs Immature Granulocytes: 0.02 10*3/uL (ref 0.00–0.07)
Basophils Absolute: 0 10*3/uL (ref 0.0–0.1)
Basophils Relative: 0 %
Eosinophils Absolute: 0.3 10*3/uL (ref 0.0–0.5)
Eosinophils Relative: 3 %
HCT: 38.5 % (ref 36.0–46.0)
Hemoglobin: 13.1 g/dL (ref 12.0–15.0)
Immature Granulocytes: 0 %
Lymphocytes Relative: 10 %
Lymphs Abs: 0.8 10*3/uL (ref 0.7–4.0)
MCH: 34.6 pg — ABNORMAL HIGH (ref 26.0–34.0)
MCHC: 34 g/dL (ref 30.0–36.0)
MCV: 101.6 fL — ABNORMAL HIGH (ref 80.0–100.0)
Monocytes Absolute: 0.5 10*3/uL (ref 0.1–1.0)
Monocytes Relative: 6 %
Neutro Abs: 7.1 10*3/uL (ref 1.7–7.7)
Neutrophils Relative %: 81 %
Platelets: 227 10*3/uL (ref 150–400)
RBC: 3.79 MIL/uL — ABNORMAL LOW (ref 3.87–5.11)
RDW: 11.9 % (ref 11.5–15.5)
WBC: 8.7 10*3/uL (ref 4.0–10.5)
nRBC: 0 % (ref 0.0–0.2)

## 2021-03-16 LAB — SURGICAL PCR SCREEN
MRSA, PCR: NEGATIVE
Staphylococcus aureus: NEGATIVE

## 2021-03-16 SURGERY — HEMIARTHROPLASTY, HIP, DIRECT ANTERIOR APPROACH, FOR FRACTURE
Anesthesia: General | Site: Hip | Laterality: Right

## 2021-03-16 MED ORDER — OXYCODONE HCL 5 MG/5ML PO SOLN: 5.0000 mg | Freq: Once | ORAL | Status: DC | PRN

## 2021-03-16 MED ORDER — POVIDONE-IODINE 10 % EX SWAB
2.0000 "application " | Freq: Once | CUTANEOUS | Status: AC
  Administered 2021-03-16: 2 via TOPICAL

## 2021-03-16 MED ORDER — TRANEXAMIC ACID-NACL 1000-0.7 MG/100ML-% IV SOLN
1000.0000 mg | Freq: Once | INTRAVENOUS | Status: AC
Start: 2021-03-16 — End: 2021-03-16
  Administered 2021-03-16: 1000 mg via INTRAVENOUS
  Filled 2021-03-16: qty 100

## 2021-03-16 MED ORDER — PHENYLEPHRINE 40 MCG/ML (10ML) SYRINGE FOR IV PUSH (FOR BLOOD PRESSURE SUPPORT)
PREFILLED_SYRINGE | INTRAVENOUS | Status: AC
  Filled 2021-03-16: qty 10

## 2021-03-16 MED ORDER — METOCLOPRAMIDE HCL 5 MG/ML IJ SOLN: 5.0000 mg | Freq: Three times a day (TID) | INTRAMUSCULAR | Status: DC | PRN

## 2021-03-16 MED ORDER — TRANEXAMIC ACID-NACL 1000-0.7 MG/100ML-% IV SOLN
INTRAVENOUS | Status: AC
  Filled 2021-03-16: qty 100

## 2021-03-16 MED ORDER — FENTANYL CITRATE (PF) 250 MCG/5ML IJ SOLN
INTRAMUSCULAR | Status: AC
  Filled 2021-03-16: qty 5

## 2021-03-16 MED ORDER — POLYETHYLENE GLYCOL 3350 17 G PO PACK: 17.0000 g | PACK | Freq: Every day | ORAL | Status: DC | PRN

## 2021-03-16 MED ORDER — ONDANSETRON HCL 4 MG/2ML IJ SOLN
INTRAMUSCULAR | Status: DC | PRN
  Administered 2021-03-16: 4 mg via INTRAVENOUS

## 2021-03-16 MED ORDER — NAPROXEN 250 MG PO TABS
250.0000 mg | ORAL_TABLET | Freq: Two times a day (BID) | ORAL | Status: DC
  Administered 2021-03-16 – 2021-03-20 (×8): 250 mg via ORAL
  Filled 2021-03-16 (×8): qty 1

## 2021-03-16 MED ORDER — CHLORHEXIDINE GLUCONATE 0.12 % MT SOLN
15.0000 mL | Freq: Once | OROMUCOSAL | Status: AC
  Administered 2021-03-16: 15 mL via OROMUCOSAL

## 2021-03-16 MED ORDER — ONDANSETRON HCL 4 MG/2ML IJ SOLN: 4.0000 mg | Freq: Once | INTRAMUSCULAR | Status: DC | PRN

## 2021-03-16 MED ORDER — APREPITANT 40 MG PO CAPS
40.0000 mg | ORAL_CAPSULE | Freq: Once | ORAL | Status: AC
  Administered 2021-03-16: 40 mg via ORAL

## 2021-03-16 MED ORDER — FENTANYL CITRATE (PF) 250 MCG/5ML IJ SOLN
INTRAMUSCULAR | Status: DC | PRN
  Administered 2021-03-16: 50 ug via INTRAVENOUS
  Administered 2021-03-16 (×3): 25 ug via INTRAVENOUS

## 2021-03-16 MED ORDER — DOCUSATE SODIUM 100 MG PO CAPS
100.0000 mg | ORAL_CAPSULE | Freq: Two times a day (BID) | ORAL | Status: DC
  Administered 2021-03-16 – 2021-03-20 (×7): 100 mg via ORAL
  Filled 2021-03-16 (×8): qty 1

## 2021-03-16 MED ORDER — ROCURONIUM BROMIDE 10 MG/ML (PF) SYRINGE
PREFILLED_SYRINGE | INTRAVENOUS | Status: DC | PRN
  Administered 2021-03-16: 45 mg via INTRAVENOUS

## 2021-03-16 MED ORDER — ONDANSETRON HCL 4 MG PO TABS: 4.0000 mg | ORAL_TABLET | Freq: Four times a day (QID) | ORAL | Status: DC | PRN

## 2021-03-16 MED ORDER — ONDANSETRON HCL 4 MG/2ML IJ SOLN
INTRAMUSCULAR | Status: AC
  Filled 2021-03-16: qty 2

## 2021-03-16 MED ORDER — MORPHINE SULFATE (PF) 2 MG/ML IV SOLN: 0.5000 mg | INTRAVENOUS | Status: DC | PRN

## 2021-03-16 MED ORDER — LACTATED RINGERS IV SOLN: INTRAVENOUS | Status: DC

## 2021-03-16 MED ORDER — BUPIVACAINE HCL (PF) 0.25 % IJ SOLN
INTRAMUSCULAR | Status: AC
  Filled 2021-03-16: qty 30

## 2021-03-16 MED ORDER — BUPIVACAINE HCL (PF) 0.25 % IJ SOLN
INTRAMUSCULAR | Status: DC | PRN
  Administered 2021-03-16: 15 mL

## 2021-03-16 MED ORDER — TRANEXAMIC ACID-NACL 1000-0.7 MG/100ML-% IV SOLN
INTRAVENOUS | Status: DC | PRN
  Administered 2021-03-16: 1000 mg via INTRAVENOUS

## 2021-03-16 MED ORDER — BISACODYL 10 MG RE SUPP: 10.0000 mg | Freq: Every day | RECTAL | Status: DC | PRN

## 2021-03-16 MED ORDER — ONDANSETRON HCL 4 MG/2ML IJ SOLN: 4.0000 mg | Freq: Four times a day (QID) | INTRAMUSCULAR | Status: DC | PRN

## 2021-03-16 MED ORDER — CEFAZOLIN SODIUM-DEXTROSE 2-4 GM/100ML-% IV SOLN
2.0000 g | INTRAVENOUS | Status: AC
  Administered 2021-03-16: 2 g via INTRAVENOUS

## 2021-03-16 MED ORDER — CHLORHEXIDINE GLUCONATE CLOTH 2 % EX PADS
6.0000 | MEDICATED_PAD | Freq: Every day | CUTANEOUS | Status: DC
  Administered 2021-03-16: 6 via TOPICAL

## 2021-03-16 MED ORDER — PROPOFOL 10 MG/ML IV BOLUS
INTRAVENOUS | Status: DC | PRN
  Administered 2021-03-16: 80 mg via INTRAVENOUS

## 2021-03-16 MED ORDER — OXYCODONE HCL 5 MG PO TABS
5.0000 mg | ORAL_TABLET | Freq: Once | ORAL | Status: DC | PRN
Start: 2021-03-16 — End: 2021-03-16

## 2021-03-16 MED ORDER — EPHEDRINE SULFATE 50 MG/ML IJ SOLN
INTRAMUSCULAR | Status: DC | PRN
  Administered 2021-03-16 (×2): 5 mg via INTRAVENOUS

## 2021-03-16 MED ORDER — SUGAMMADEX SODIUM 200 MG/2ML IV SOLN
INTRAVENOUS | Status: DC | PRN
  Administered 2021-03-16: 150 mg via INTRAVENOUS

## 2021-03-16 MED ORDER — HYDROCODONE-ACETAMINOPHEN 7.5-325 MG PO TABS: 1.0000 | ORAL_TABLET | ORAL | Status: DC | PRN

## 2021-03-16 MED ORDER — HYDROCODONE-ACETAMINOPHEN 5-325 MG PO TABS
1.0000 | ORAL_TABLET | ORAL | Status: DC | PRN
  Administered 2021-03-17 – 2021-03-19 (×5): 2 via ORAL
  Filled 2021-03-16 (×5): qty 2

## 2021-03-16 MED ORDER — FENTANYL CITRATE (PF) 100 MCG/2ML IJ SOLN
INTRAMUSCULAR | Status: AC
  Filled 2021-03-16: qty 2

## 2021-03-16 MED ORDER — DEXAMETHASONE SODIUM PHOSPHATE 10 MG/ML IJ SOLN
INTRAMUSCULAR | Status: DC | PRN
  Administered 2021-03-16: 8 mg via INTRAVENOUS

## 2021-03-16 MED ORDER — LIDOCAINE 2% (20 MG/ML) 5 ML SYRINGE
INTRAMUSCULAR | Status: DC | PRN
  Administered 2021-03-16: 40 mg via INTRAVENOUS

## 2021-03-16 MED ORDER — SODIUM CHLORIDE 0.45 % IV SOLN: INTRAVENOUS | Status: DC

## 2021-03-16 MED ORDER — PROPOFOL 10 MG/ML IV BOLUS
INTRAVENOUS | Status: AC
  Filled 2021-03-16: qty 20

## 2021-03-16 MED ORDER — METOCLOPRAMIDE HCL 5 MG PO TABS: 5.0000 mg | ORAL_TABLET | Freq: Three times a day (TID) | ORAL | Status: DC | PRN

## 2021-03-16 MED ORDER — FENTANYL CITRATE (PF) 100 MCG/2ML IJ SOLN
25.0000 ug | INTRAMUSCULAR | Status: DC | PRN
  Administered 2021-03-16: 25 ug via INTRAVENOUS

## 2021-03-16 MED ORDER — CHLORHEXIDINE GLUCONATE 4 % EX LIQD: 60.0000 mL | Freq: Once | CUTANEOUS | Status: DC

## 2021-03-16 MED ORDER — PHENYLEPHRINE 40 MCG/ML (10ML) SYRINGE FOR IV PUSH (FOR BLOOD PRESSURE SUPPORT)
PREFILLED_SYRINGE | INTRAVENOUS | Status: DC | PRN
  Administered 2021-03-16: 120 ug via INTRAVENOUS
  Administered 2021-03-16: 80 ug via INTRAVENOUS

## 2021-03-16 MED ORDER — PHENOL 1.4 % MT LIQD: 1.0000 | OROMUCOSAL | Status: DC | PRN

## 2021-03-16 MED ORDER — MENTHOL 3 MG MT LOZG: 1.0000 | LOZENGE | OROMUCOSAL | Status: DC | PRN

## 2021-03-16 SURGICAL SUPPLY — 72 items
BENZOIN TINCTURE PRP APPL 2/3 (GAUZE/BANDAGES/DRESSINGS) ×2 IMPLANT
BLADE CLIPPER SURG (BLADE) IMPLANT
BLADE SAW SAG 73X25 THK (BLADE) ×1
BLADE SAW SGTL 73X25 THK (BLADE) ×1 IMPLANT
BRUSH FEMORAL CANAL (MISCELLANEOUS) IMPLANT
CEMENT BONE DEPUY (Cement) ×2 IMPLANT
CEMENT RESTRICTOR DEPUY SZ 4 (Cement) ×1 IMPLANT
CEMENTRALIZER 9.25MM (Orthopedic Implant) ×1 IMPLANT
COVER BACK TABLE 24X17X13 BIG (DRAPES) IMPLANT
COVER WAND RF STERILE (DRAPES) ×2 IMPLANT
DRAPE IMP U-DRAPE 54X76 (DRAPES) ×2 IMPLANT
DRAPE INCISE IOBAN 66X45 STRL (DRAPES) IMPLANT
DRAPE ORTHO SPLIT 77X108 STRL (DRAPES) ×4
DRAPE SURG ORHT 6 SPLT 77X108 (DRAPES) ×2 IMPLANT
DRAPE U-SHAPE 47X51 STRL (DRAPES) ×2 IMPLANT
DRSG MEPILEX BORDER 4X8 (GAUZE/BANDAGES/DRESSINGS) ×2 IMPLANT
DRSG MEPILEX SACRM 8.7X9.8 (GAUZE/BANDAGES/DRESSINGS) ×1 IMPLANT
DRSG PAD ABDOMINAL 8X10 ST (GAUZE/BANDAGES/DRESSINGS) ×4 IMPLANT
DURAPREP 26ML APPLICATOR (WOUND CARE) ×2 IMPLANT
ELECT CAUTERY BLADE 6.4 (BLADE) ×2 IMPLANT
ELECT REM PT RETURN 9FT ADLT (ELECTROSURGICAL) ×2
ELECTRODE REM PT RTRN 9FT ADLT (ELECTROSURGICAL) ×1 IMPLANT
EVACUATOR 1/8 PVC DRAIN (DRAIN) IMPLANT
FACESHIELD WRAPAROUND (MASK) ×4 IMPLANT
FACESHIELD WRAPAROUND OR TEAM (MASK) ×2 IMPLANT
GAUZE SPONGE 4X4 12PLY STRL (GAUZE/BANDAGES/DRESSINGS) ×2 IMPLANT
GAUZE SPONGE 4X4 12PLY STRL LF (GAUZE/BANDAGES/DRESSINGS) ×1 IMPLANT
GAUZE XEROFORM 5X9 LF (GAUZE/BANDAGES/DRESSINGS) ×2 IMPLANT
GLOVE ORTHO TXT STRL SZ7.5 (GLOVE) ×4 IMPLANT
GLOVE SRG 8 PF TXTR STRL LF DI (GLOVE) ×2 IMPLANT
GLOVE SURG UNDER POLY LF SZ8 (GLOVE) ×4
GOWN STRL REUS W/ TWL LRG LVL3 (GOWN DISPOSABLE) ×1 IMPLANT
GOWN STRL REUS W/ TWL XL LVL3 (GOWN DISPOSABLE) ×1 IMPLANT
GOWN STRL REUS W/TWL 2XL LVL3 (GOWN DISPOSABLE) ×2 IMPLANT
GOWN STRL REUS W/TWL LRG LVL3 (GOWN DISPOSABLE) ×2
GOWN STRL REUS W/TWL XL LVL3 (GOWN DISPOSABLE) ×2
HANDPIECE INTERPULSE COAX TIP (DISPOSABLE)
HEAD FEM UNIPOLAR 47 OD STRL (Hips) ×1 IMPLANT
IMMOBILIZER KNEE 22 UNIV (SOFTGOODS) IMPLANT
KIT BASIN OR (CUSTOM PROCEDURE TRAY) ×2 IMPLANT
KIT TURNOVER KIT B (KITS) ×2 IMPLANT
MANIFOLD NEPTUNE II (INSTRUMENTS) ×2 IMPLANT
NDL HYPO 25GX1X1/2 BEV (NEEDLE) ×1 IMPLANT
NDL SUT 2 .5 CRC MAYO 1.732X (NEEDLE) ×1 IMPLANT
NEEDLE HYPO 25GX1X1/2 BEV (NEEDLE) ×2 IMPLANT
NEEDLE MAYO TAPER (NEEDLE) ×4
NS IRRIG 1000ML POUR BTL (IV SOLUTION) ×2 IMPLANT
PACK TOTAL JOINT (CUSTOM PROCEDURE TRAY) ×2 IMPLANT
PACK UNIVERSAL I (CUSTOM PROCEDURE TRAY) ×2 IMPLANT
PAD ABD 8X10 STRL (GAUZE/BANDAGES/DRESSINGS) ×1 IMPLANT
PAD ARMBOARD 7.5X6 YLW CONV (MISCELLANEOUS) ×4 IMPLANT
PIN STEINMAN 3/16 (PIN) ×2 IMPLANT
SET HNDPC FAN SPRY TIP SCT (DISPOSABLE) IMPLANT
SPACER FEM TAPERED +0 12/14 (Hips) ×1 IMPLANT
SPONGE LAP 4X18 RFD (DISPOSABLE) ×4 IMPLANT
STAPLER VISISTAT 35W (STAPLE) ×2 IMPLANT
STEM SUMMIT CEMENT BASIC SZ4 (Hips) ×1 IMPLANT
STRIP CLOSURE SKIN 1/2X4 (GAUZE/BANDAGES/DRESSINGS) ×4 IMPLANT
SUCTION FRAZIER HANDLE 10FR (MISCELLANEOUS) ×2
SUCTION TUBE FRAZIER 10FR DISP (MISCELLANEOUS) ×1 IMPLANT
SUT ETHIBOND NAB CT1 #1 30IN (SUTURE) ×7 IMPLANT
SUT TICRON (SUTURE) ×4 IMPLANT
SUT VIC AB 2-0 CT1 27 (SUTURE) ×6
SUT VIC AB 2-0 CT1 TAPERPNT 27 (SUTURE) ×3 IMPLANT
SUT VICRYL 0 TIES 12 18 (SUTURE) ×2 IMPLANT
SYR CONTROL 10ML LL (SYRINGE) ×2 IMPLANT
TAPE CLOTH .5X10 WHT NS (GAUZE/BANDAGES/DRESSINGS) ×1 IMPLANT
TOWEL GREEN STERILE (TOWEL DISPOSABLE) ×2 IMPLANT
TOWEL GREEN STERILE FF (TOWEL DISPOSABLE) ×2 IMPLANT
TOWER CARTRIDGE SMART MIX (DISPOSABLE) IMPLANT
TRAY FOLEY MTR SLVR 16FR STAT (SET/KITS/TRAYS/PACK) IMPLANT
WATER STERILE IRR 1000ML POUR (IV SOLUTION) ×8 IMPLANT

## 2021-03-16 NOTE — Anesthesia Postprocedure Evaluation (Signed)
Anesthesia Post Note  Patient: Valerie [name]  Procedure(s) Performed: POSTERIOR ARTHROPLASTY BIPOLAR HIP (HEMIARTHROPLASTY) (Right: Hip)     Patient location during evaluation: PACU Anesthesia Type: General Level of consciousness: awake and alert Pain management: pain level controlled Vital Signs Assessment: post-procedure vital signs reviewed and stable Respiratory status: spontaneous breathing, nonlabored ventilation and respiratory function stable Cardiovascular status: blood pressure returned to baseline and stable Postop Assessment: no apparent nausea or vomiting Anesthetic complications: no   No notable events documented.  Last Vitals:  Vitals:   03/16/21 1523 03/16/21 1547  BP: (!) 122/59 (!) 115/51  Pulse: 77 72  Resp: 15 16  Temp: (!) 36.4 C 36.6 C  SpO2: 98% 96%    Last Pain:  Vitals:   03/16/21 1601  TempSrc:   PainSc: [place]

## 2021-03-16 NOTE — H&P (View-Only) (Signed)
Reason for Consult:right hip femoral neck closed fracture Referring Physician: Arrien MD  Valerie [name] is an 87 y.o. female.  HPI: 87 year old female history of atrial fibs flutter on Eliquis at the time of her follow-up with chronic A. fib, hypothyroidism had mechanical fall in her bathroom injuring her right hip.  Excruciating pain unable to ambulate.  Family called EMS transported emergency room at [facility] in [place] where x-rays demonstrated impacted femoral neck fracture.  Unfortunately no beds were available and she is waited 2 days for transfer since there were no orthopedist available over the weekend at [facility] in [facility].  Patient was ambulator without cane or walker prior to the fall.  She complains excruciating right hip pain with range of motion. Past Medical History:  Diagnosis Date   Anxiety    Arthritis    Broken ribs    Carotid artery disease ([place])    Cataract    Collar bone fracture    Right   Depression    Diverticulosis of colon with hemorrhage    Duodenitis    Esophageal reflux    Esophageal stricture    Essential hypertension    Glaucoma    Hiatal hernia    History of kidney stones     x1    Hyperlipidemia    IBS (irritable bowel syndrome)    Palpitations    UTI (urinary tract infection) March 2015    Past Surgical History:  Procedure Laterality Date   CHOLECYSTECTOMY     COLONOSCOPY     DILATION AND CURETTAGE OF UTERUS     EYE SURGERY Bilateral    total of 5   LASER PHOTO ABLATION Right 10/20/2016   Procedure: LASER PHOTO ABLATION;  Surgeon: Hayden Pedro, MD;  Location: [place];  Service: Ophthalmology;  Laterality: Right;  Headscope laser   LEFT ROTATOR CUFF REPAIR X2 Left    PARS PLANA VITRECTOMY  10/20/2016   WITH 25G REMOVAL/SUTURE SECONDARY INTRAOCULAR LENS, GAS FLUID EXCHANGE    PARS PLANA VITRECTOMY Right 10/20/2016   Procedure: PARS PLANA VITRECTOMY WITH 25G REMOVAL/SUTURE SECONDARY INTRAOCULAR LENS, GAS FLUID  EXCHANGE;  Surgeon: Hayden Pedro, MD;  Location: [place];  Service: Ophthalmology;  Laterality: Right;   RIGHT ROTATOR CUFF REPAIR X1 Right    UPPER GASTROINTESTINAL ENDOSCOPY     with dilation    Family History  Problem Relation Age of Onset   Leukemia Mother    Colon cancer Father 72   Kidney cancer Brother    Bladder Cancer Brother    Stroke Sister    Esophageal cancer Neg Hx    Stomach cancer Neg Hx     Social History:  reports that she has never smoked. She has never used smokeless tobacco. She reports that she does not drink alcohol and does not use drugs.  Allergies:  Allergies  Allergen Reactions   Aricept [Donepezil Hcl] Other (See Comments)    Nightmares, near syncope, weak, decreased appetite.   Lisinopril Other (See Comments)    Hair loss   Sulfa Antibiotics Nausea Only   Sulfasalazine Nausea Only    Medications: I have reviewed the patient's current medications.  Results for orders placed or performed during the hospital encounter of 03/15/21 (from the past 48 hour(s))  CBC with Differential/Platelet     Status: Abnormal   Collection Time: 03/16/21  1:40 AM  Result Value Ref Range   WBC 8.7 4.0 - 10.5 K/uL   RBC 3.79 (L)  3.87 - 5.11 MIL/uL   Hemoglobin 13.1 12.0 - 15.0 g/dL   HCT 38.5 36.0 - 46.0 %   MCV 101.6 (H) 80.0 - 100.0 fL   MCH 34.6 (H) 26.0 - 34.0 pg   MCHC 34.0 30.0 - 36.0 g/dL   RDW 11.9 11.5 - 15.5 %   Platelets 227 150 - 400 K/uL   nRBC 0.0 0.0 - 0.2 %   Neutrophils Relative % 81 %   Neutro Abs 7.1 1.7 - 7.7 K/uL   Lymphocytes Relative 10 %   Lymphs Abs 0.8 0.7 - 4.0 K/uL   Monocytes Relative 6 %   Monocytes Absolute 0.5 0.1 - 1.0 K/uL   Eosinophils Relative 3 %   Eosinophils Absolute 0.3 0.0 - 0.5 K/uL   Basophils Relative 0 %   Basophils Absolute 0.0 0.0 - 0.1 K/uL   Immature Granulocytes 0 %   Abs Immature Granulocytes 0.02 0.00 - 0.07 K/uL    Comment: Performed at [facility] [address] [place], [place] [address]   Surgical pcr screen     Status: None   Collection Time: 03/16/21  5:05 AM   Specimen: Nasal Mucosa; Nasal Swab  Result Value Ref Range   MRSA, PCR NEGATIVE NEGATIVE   Staphylococcus aureus NEGATIVE NEGATIVE    Comment: (NOTE) The Xpert SA Assay (FDA approved for NASAL specimens in patients 71 years of age and older), is one component of a comprehensive surveillance program. It is not intended to diagnose infection nor to guide or monitor treatment. Performed at [facility], [place] [address], [place],  [address]     No results found.  ROS positive for anxiety previous rib fractures history depression diverticulosis.  Atrial for flutter on Eliquis chronically. Blood pressure (!) 133/58, pulse 70, temperature 97.9 F (36.6 C), temperature source Oral, resp. rate 16, SpO2 97 %. Physical Exam Constitutional:      Comments: Thin appearance  HENT:     Head: Atraumatic.     Right Ear: External ear normal.     Left Ear: External ear normal.  Eyes:     Extraocular Movements: Extraocular movements intact.  Cardiovascular:     Rate and Rhythm: Normal rate and regular rhythm.  Pulmonary:     Effort: Pulmonary effort is normal.     Breath sounds: No stridor. No wheezing.  Abdominal:     General: Abdomen is flat.  Musculoskeletal:        General: Tenderness present.     Comments: Pain with right hip range of motion.  Skin:    General: Skin is warm.  Neurological:     Mental Status: She is alert.  Psychiatric:        Mood and Affect: Mood normal.        Behavior: Behavior normal.        Thought Content: Thought content normal.    Assessment/Plan: Plan right hip cemented hemiarthroplasty bipolar for femoral neck fracture.  Risk surgery discussed wound dislocation, infection, reoperation and fracture.  Questions elicited and answered she understands wished to proceed.  Decision for surgery made.  [name] 03/16/2021, 10:52 AM

## 2021-03-16 NOTE — Interval H&P Note (Signed)
History and Physical Interval Note:  03/16/2021 11:57 AM  Valerie [name]  has presented today for surgery, with the diagnosis of Right Hip.  The various methods of treatment have been discussed with the patient and family. After consideration of risks, benefits and other options for treatment, the patient has consented to  Procedure(s): POSTERIOR ARTHROPLASTY BIPOLAR HIP (HEMIARTHROPLASTY) (Right) as a surgical intervention.  The patient's history has been reviewed, patient examined, no change in status, stable for surgery.  I have reviewed the patient's chart and labs.  Questions were answered to the patient's satisfaction.     [name]

## 2021-03-16 NOTE — Anesthesia Preprocedure Evaluation (Addendum)
Anesthesia Evaluation  Patient identified by MRN, date of birth, ID band Patient awake    Reviewed: Allergy & Precautions, NPO status , Patient's Chart, lab work & pertinent test results  History of Anesthesia Complications Negative for: history of anesthetic complications  Airway Mallampati: II  TM Distance: >3 FB Neck ROM: Full    Dental  (+) Dental Advisory Given, Lower Dentures, Upper Dentures   Pulmonary neg pulmonary ROS,    Pulmonary exam normal        Cardiovascular hypertension, Pt. on medications +CHF  + dysrhythmias Atrial Fibrillation  Rhythm:Irregular Rate:Normal   '20 TTE - EF 60-65%. LV diastolic Doppler parameters are consistent with impaired relaxation. Left atrial size was moderately dilated. Mild sclerosis of the aortic valve.   '19 Carotid US - 1-39% b/l ICAS    Neuro/Psych PSYCHIATRIC DISORDERS Anxiety Depression negative neurological ROS     GI/Hepatic Neg liver ROS, hiatal hernia, GERD  Medicated, Esophageal stricture    Endo/Other  Hyperthyroidism (on tapazole)   Renal/GU negative Renal ROS     Musculoskeletal  (+) Arthritis ,   Abdominal   Peds  Hematology  On eliquis    Anesthesia Other Findings   Reproductive/Obstetrics                           Anesthesia Physical Anesthesia Plan  ASA: 3  Anesthesia Plan: General   Post-op Pain Management:    Induction: Intravenous  PONV Risk Score and Plan: 3 and Treatment may vary due to age or medical condition, Ondansetron and Aprepitant  Airway Management Planned: Oral ETT  Additional Equipment: None  Intra-op Plan:   Post-operative Plan: Extubation in OR  Informed Consent: I have reviewed the patients History and Physical, chart, labs and discussed the procedure including the risks, benefits and alternatives for the proposed anesthesia with the patient or authorized representative who has indicated  his/her understanding and acceptance.     Dental advisory given  Plan Discussed with: CRNA and Anesthesiologist  Anesthesia Plan Comments:        Anesthesia Quick Evaluation

## 2021-03-16 NOTE — Anesthesia Procedure Notes (Signed)
Procedure Name: Intubation Date/Time: 03/16/2021 12:25 PM Performed by: Inda Coke, CRNA Pre-anesthesia Checklist: Patient identified, Emergency Drugs available, Suction available and Patient being monitored Patient Re-evaluated:Patient Re-evaluated prior to induction Oxygen Delivery Method: Circle System Utilized Preoxygenation: Pre-oxygenation with 100% oxygen Induction Type: IV induction Ventilation: Mask ventilation without difficulty Laryngoscope Size: Mac and 3 Grade View: Grade I Tube type: Oral Tube size: 7.0 mm Number of attempts: 1 Airway Equipment and Method: Stylet and Oral airway Placement Confirmation: ETT inserted through vocal cords under direct vision, positive ETCO2 and breath sounds checked- equal and bilateral Secured at: 20 cm Tube secured with: Tape Dental Injury: Teeth and Oropharynx as per pre-operative assessment

## 2021-03-16 NOTE — Plan of Care (Signed)
  Problem: Education: Goal: Knowledge of General Education information will improve Description: Including pain rating scale, medication(s)/side effects and non-pharmacologic comfort measures Outcome: Progressing   Problem: Health Behavior/Discharge Planning: Goal: Ability to manage health-related needs will improve Outcome: Progressing   Problem: Clinical Measurements: Goal: Ability to maintain clinical measurements within normal limits will improve Outcome: Progressing   Problem: Nutrition: Goal: Adequate nutrition will be maintained Outcome: Progressing   Problem: Coping: Goal: Level of anxiety will decrease Outcome: Progressing   Problem: Pain Managment: Goal: General experience of comfort will improve Outcome: Progressing   

## 2021-03-16 NOTE — Consult Note (Signed)
Reason for Consult:right hip femoral neck closed fracture Referring Physician: Arrien MD  Valerie [name] is an 87 y.o. female.  HPI: 87 year old female history of atrial fibs flutter on Eliquis at the time of her follow-up with chronic A. fib, hypothyroidism had mechanical fall in her bathroom injuring her right hip.  Excruciating pain unable to ambulate.  Family called EMS transported emergency room at [facility] in [place] where x-rays demonstrated impacted femoral neck fracture.  Unfortunately no beds were available and she is waited 2 days for transfer since there were no orthopedist available over the weekend at [facility] in [facility].  Patient was ambulator without cane or walker prior to the fall.  She complains excruciating right hip pain with range of motion. Past Medical History:  Diagnosis Date   Anxiety    Arthritis    Broken ribs    Carotid artery disease ([address])    Cataract    Collar bone fracture    Right   Depression    Diverticulosis of colon with hemorrhage    Duodenitis    Esophageal reflux    Esophageal stricture    Essential hypertension    Glaucoma    Hiatal hernia    History of kidney stones     x1    Hyperlipidemia    IBS (irritable bowel syndrome)    Palpitations    UTI (urinary tract infection) March 2015    Past Surgical History:  Procedure Laterality Date   CHOLECYSTECTOMY     COLONOSCOPY     DILATION AND CURETTAGE OF UTERUS     EYE SURGERY Bilateral    total of 5   LASER PHOTO ABLATION Right 10/20/2016   Procedure: LASER PHOTO ABLATION;  Surgeon: Hayden Pedro, MD;  Location: [place];  Service: Ophthalmology;  Laterality: Right;  Headscope laser   LEFT ROTATOR CUFF REPAIR X2 Left    PARS PLANA VITRECTOMY  10/20/2016   WITH 25G REMOVAL/SUTURE SECONDARY INTRAOCULAR LENS, GAS FLUID EXCHANGE    PARS PLANA VITRECTOMY Right 10/20/2016   Procedure: PARS PLANA VITRECTOMY WITH 25G REMOVAL/SUTURE SECONDARY INTRAOCULAR LENS, GAS FLUID  EXCHANGE;  Surgeon: Hayden Pedro, MD;  Location: [place];  Service: Ophthalmology;  Laterality: Right;   RIGHT ROTATOR CUFF REPAIR X1 Right    UPPER GASTROINTESTINAL ENDOSCOPY     with dilation    Family History  Problem Relation Age of Onset   Leukemia Mother    Colon cancer Father 47   Kidney cancer Brother    Bladder Cancer Brother    Stroke Sister    Esophageal cancer Neg Hx    Stomach cancer Neg Hx     Social History:  reports that she has never smoked. She has never used smokeless tobacco. She reports that she does not drink alcohol and does not use drugs.  Allergies:  Allergies  Allergen Reactions   Aricept [Donepezil Hcl] Other (See Comments)    Nightmares, near syncope, weak, decreased appetite.   Lisinopril Other (See Comments)    Hair loss   Sulfa Antibiotics Nausea Only   Sulfasalazine Nausea Only    Medications: I have reviewed the patient's current medications.  Results for orders placed or performed during the hospital encounter of 03/15/21 (from the past 48 hour(s))  CBC with Differential/Platelet     Status: Abnormal   Collection Time: 03/16/21  1:40 AM  Result Value Ref Range   WBC 8.7 4.0 - 10.5 K/uL   RBC 3.79 (L)  3.87 - 5.11 MIL/uL   Hemoglobin 13.1 12.0 - 15.0 g/dL   HCT 38.5 36.0 - 46.0 %   MCV 101.6 (H) 80.0 - 100.0 fL   MCH 34.6 (H) 26.0 - 34.0 pg   MCHC 34.0 30.0 - 36.0 g/dL   RDW 11.9 11.5 - 15.5 %   Platelets 227 150 - 400 K/uL   nRBC 0.0 0.0 - 0.2 %   Neutrophils Relative % 81 %   Neutro Abs 7.1 1.7 - 7.7 K/uL   Lymphocytes Relative 10 %   Lymphs Abs 0.8 0.7 - 4.0 K/uL   Monocytes Relative 6 %   Monocytes Absolute 0.5 0.1 - 1.0 K/uL   Eosinophils Relative 3 %   Eosinophils Absolute 0.3 0.0 - 0.5 K/uL   Basophils Relative 0 %   Basophils Absolute 0.0 0.0 - 0.1 K/uL   Immature Granulocytes 0 %   Abs Immature Granulocytes 0.02 0.00 - 0.07 K/uL    Comment: Performed at [facility] [address], [place], [place] [address]   Surgical pcr screen     Status: None   Collection Time: 03/16/21  5:05 AM   Specimen: Nasal Mucosa; Nasal Swab  Result Value Ref Range   MRSA, PCR NEGATIVE NEGATIVE   Staphylococcus aureus NEGATIVE NEGATIVE    Comment: (NOTE) The Xpert SA Assay (FDA approved for NASAL specimens in patients 78 years of age and older), is one component of a comprehensive surveillance program. It is not intended to diagnose infection nor to guide or monitor treatment. Performed at [facility], [place] [address] [place], [place] [address]     No results found.  ROS positive for anxiety previous rib fractures history depression diverticulosis.  Atrial for flutter on Eliquis chronically. Blood pressure (!) 133/58, pulse 70, temperature 97.9 F (36.6 C), temperature source Oral, resp. rate 16, SpO2 97 %. Physical Exam Constitutional:      Comments: Thin appearance  HENT:     Head: Atraumatic.     Right Ear: External ear normal.     Left Ear: External ear normal.  Eyes:     Extraocular Movements: Extraocular movements intact.  Cardiovascular:     Rate and Rhythm: Normal rate and regular rhythm.  Pulmonary:     Effort: Pulmonary effort is normal.     Breath sounds: No stridor. No wheezing.  Abdominal:     General: Abdomen is flat.  Musculoskeletal:        General: Tenderness present.     Comments: Pain with right hip range of motion.  Skin:    General: Skin is warm.  Neurological:     Mental Status: She is alert.  Psychiatric:        Mood and Affect: Mood normal.        Behavior: Behavior normal.        Thought Content: Thought content normal.    Assessment/Plan: Plan right hip cemented hemiarthroplasty bipolar for femoral neck fracture.  Risk surgery discussed wound dislocation, infection, reoperation and fracture.  Questions elicited and answered she understands wished to proceed.  Decision for surgery made.  [name] 03/16/2021, 10:52 AM

## 2021-03-16 NOTE — Op Note (Signed)
Preop diagnosis: Right femoral neck fracture  Postop diagnosis: Same  Procedure: Right hip hemiarthroplasty, cemented.  Surgeon: Lorin Mercy MD  Anesthesia: General orotracheal +15cc Marcaine local  EBL less than 100 cc  Implants:Implants   HIP BALL DEPUY - [id number]  Inventory Item: HIP BALL [place] Serial no.:  Model/Cat no.: [id number]  Implant name: HIP BALL DEPUY - [id number] Laterality: Right Area: Hip  Manufacturer: [place] Date of Manufacture:    Action: Implanted Number Used: 1   Device Identifier:  Device Identifier Type:     STEM SUMMIT CEMENT BASIC SZ4 - [id number]  Inventory Item: STEM SUMMIT CEMENT BASIC SZ4 Serial no.:  Model/Cat no.: [id number]  Implant name: STEM SUMMIT CEMENT BASIC SZ4 - [id number] Laterality: Right Area: Hip  Manufacturer: [place] Date of Manufacture:    Action: Implanted Number Used: 1   Device Identifier:  Device Identifier Type:     CEMENTRALIZER 9.25MM - [id number]  Inventory Item: CEMENTRALIZER 9.25MM Serial no.:  Model/Cat no.: [id number]  Implant name: CEMENTRALIZER 9.25MM - [id number] Laterality: Right Area: Hip  Manufacturer: [place] Date of Manufacture:    Action: Implanted Number Used: 1   Device Identifier:  Device Identifier Type:     CEMENT BONE DEPUY - [id number]  Inventory Item: CEMENT BONE DEPUY Serial no.:  Model/Cat no.: [id number]  Implant name: CEMENT BONE DEPUY - [id number] Laterality: Right Area: Hip  Manufacturer: [place] Date of Manufacture:    Action: Implanted Number Used: 1   Device Identifier:  Device Identifier Type:     CEMENT RESTRICTOR DEPUY SZ 4 - [id number]  Inventory Item: CEMENT RESTRICTOR DEPUY SZ 4 Serial no.:  Model/Cat no.: [id number]  Implant name: CEMENT RESTRICTOR DEPUY SZ 4 - [id number] Laterality: Right Area: Hip  Manufacturer: [place] Date of Manufacture:    Action: Implanted Number Used: 1   Device Identifier:  Device Identifier Type:     CEMENT  BONE DEPUY - [id number]  Inventory Item: CEMENT BONE DEPUY Serial no.:  Model/Cat no.: [id number]  Implant name: CEMENT BONE DEPUY - [id number] Laterality: Right Area: Hip  Manufacturer: [place] Date of Manufacture:    Action: Implanted Number Used: 1   Device Identifier:  Device Identifier Type:     SPACER TAPER DEPUY - [id number]  Inventory Item: SPACER TAPER DEPUY Serial no.:  Model/Cat no.: [id number]  Implant name: SPACER Carlena Sax - [id number] Laterality: Right Area: Hip  Manufacturer: [place] Date of Manufacture:    Action: Implanted Number Used: 1   Device Identifier:  Device Identifier Type:      Procedure: After induction general anesthesia Ancef prophylaxis timeout procedure lateral position marked to frame with axillary roll right hip up TXA given IV hip was prepped down to the ankle with DuraPrep the usual total hip sheets drapes impervious stockinette Coban Betadine Steri-Drape x2 was used to seal the skin.  Sterile skin marker was used for Betadine Steri-Drape for posterior planned incision.  After timeout posterior approach was made internal retractor placed piriformis tagged cut capsule open neck cut 9 mm neck length slightly less than 1 finger breath.  And was removed with a corkscrew sized at 47 good suction fit.  46 with 2 small.  Preparation of the canal was performed progressing up to #4 broach cementralizer was placed vacuum mixing with cement pulsatile lavage with a bottle brush for the canal serial small sponges drying and then placement of the cement followed by prosthesis.  We trialed off permanent stem once the cement was hard  and appropriate anteversion was held to the cement was hard.  +0 neck length restored leg lengths excellent stability flexing to 90 internal rotation 90 without subluxation of the hip.  Quad is not too tight patient tolerated procedure after repeat irrigation the capsule that was open with a T was repaired with #1 Ethibond.  Free  needle was used to repair the piriformis back to gluteus medius tendon.  Tensor fascia repaired gluteus maximus.  0 Vicryl.  2-0 Vicryl subtenons tissue skin staple closure Marcaine infiltration postop dressing with Xeroform 4 x 4's ABDs tape and knee immobilizer.  Instrument count needle count was correct.

## 2021-03-16 NOTE — Plan of Care (Signed)
  Problem: Activity: Goal: Risk for activity intolerance will decrease Outcome: Progressing   Problem: Coping: Goal: Level of anxiety will decrease Outcome: Progressing   Problem: Pain Managment: Goal: General experience of comfort will improve Outcome: Progressing   Problem: Safety: Goal: Ability to remain free from injury will improve Outcome: Progressing   Problem: Skin Integrity: Goal: Risk for impaired skin integrity will decrease Outcome: Progressing   

## 2021-03-16 NOTE — Transfer of Care (Signed)
Immediate Anesthesia Transfer of Care Note  Patient: Valerie [name]  Procedure(s) Performed: POSTERIOR ARTHROPLASTY BIPOLAR HIP (HEMIARTHROPLASTY) (Right: Hip)  Patient Location: PACU  Anesthesia Type:General  Level of Consciousness: awake and alert   Airway & Oxygen Therapy: Patient Spontanous Breathing and Patient connected to nasal cannula oxygen  Post-op Assessment: Report given to RN and Post -op Vital signs reviewed and stable  Post vital signs: Reviewed and stable  Last Vitals:  Vitals Value Taken Time  BP 116/57 03/16/21 1408  Temp    Pulse 73 03/16/21 1410  Resp 18 03/16/21 1410  SpO2 92 % 03/16/21 1410  Vitals shown include unvalidated device data.  Last Pain:  Vitals:   03/16/21 1156  TempSrc: Oral  PainSc: 0-No pain      Patients Stated Pain Goal: 3 (32/41/99 1444)  Complications: No notable events documented.

## 2021-03-16 NOTE — Progress Notes (Signed)
PROGRESS NOTE    Valerie [name]  OQH:[id number] DOB: 10-20-1927 DOA: 03/15/2021 PCP: Chevis Pretty, FNP    Brief Narrative:  Valerie [name] was admitted to the hospital with the working diagnosis of right femoral neck fracture.  87 year old female with past medical history for chronic atrial fibrillation/flutter, hypertension, dyslipidemia, glaucoma and hyperthyroid who presented after a mechanical fall.  She usually ambulates without help, no need of a walker or cane.  She does have glaucoma limiting her mobility.  2 days prior to hospitalization she stepped on a puddle of water in the bathroom and fell on her right hip producing severe pain and amatory dysfunction.  Due to persistent symptoms EMS was called and patient was taken to [facility], she was diagnosed with right femoral neck fracture and was transferred to [facility] for further orthopedic evaluation. At the time of her transfer her blood pressure was 134/54, heart rate 75, temperature 98.3, oxygen saturation 99%.  Her lungs are clear to auscultation, heart S1-S2, present, rhythmic, 3-6 systolic murmur at the apex, abdomen soft nontender, no lower extremity edema, right leg with shortened and internally rotated.  White blood cell count 8.7, hemoglobin 13.1, hematocrit 38.5, platelets 227. 6/16 SARS COVID-19 negative. Sodium 136, potassium 4.0, chloride 103, bicarb 26, BUN 29, creatinine 1.17. Urinalysis specific gravity 1.015, negative nitrates.  Right pelvic x-rays with impacted right femoral neck fracture. EKG 80 bpm, sinus rhythm.  Assessment & Plan:   Principal Problem:   Femoral neck fracture (HCC) Active Problems:   Iron deficiency anemia   Glaucoma   Hyperlipidemia   Atrial fibrillation (HCC)   Aortic insufficiency   Essential hypertension   Right femoral neck fracture. Patient currently NPO in preparation for surgical procedure. Pain is well controlled with hydromorphone IV.   Plan for surgical  intervention today per orthopedics. Continue holding apixaban (at home on 2,5 mg po bid).  2.   Paroxysmal atrial fibrillation. EKG at [facility] reported to be sinus rhythm. Her heart rate continue to be well controlled.  Will allow patient to have po meds with sips of water resume taking carvedilol. Continue to hold on apixaban.   3. HTN/ dyslipidemia. Blood pressure has been stable, 127 to 546 mmHg systolic, will hold on antihypertensive medications for now.   Continue with pravastatin.   4. Hyperthyroid. Continue with methimazole.   5. Iron deficiency anemia. Close follow up on Hgb and hct post surgery.         Patient continue to be at high risk for medical complications related to acute hip fracture in the setting of advance age and atrial fibrillation   Status is: Inpatient  Remains inpatient appropriate because:Inpatient level of care appropriate due to severity of illness  Dispo: The patient is from: Home              Anticipated d/c is to: SNF              Patient currently is not medically stable to d/c.   Difficult to place patient No    DVT prophylaxis: Scd   Code Status:   full  Family Communication:  No family at the bedside      Nutrition Status:           Skin Documentation:     Consultants:  Orthopedics     Subjective: Patient continue to have right lower extremity with movement, moderate in intensity with no radiation, improved with analgesics, no associated dyspnea, nausea or vomiting.   Objective:  Vitals:   03/15/21 1700 03/15/21 2100  BP: (!) 134/54 (!) 127/55  Pulse: 75 79  Temp: 98.3 F (36.8 C) 97.7 F (36.5 C)  TempSrc: Oral Oral  SpO2: 99% 96%    Intake/Output Summary (Last 24 hours) at 03/16/2021 0834 Last data filed at 03/16/2021 0500 Gross per 24 hour  Intake 120 ml  Output 400 ml  Net -280 ml   There were no vitals filed for this visit.  Examination:   General: Not in pain or dyspnea. Deconditioned  Neurology: Awake  and alert, non focal  E ENT: no pallor, no icterus, oral mucosa moist Cardiovascular: No JVD. S1-S2 present, rhythmic, no gallops, positive murmur 3/6 at the apex. No lower extremity edema. Pulmonary: positive breath sounds bilaterally, adequate air movement, no wheezing, rhonchi or rales. Gastrointestinal. Abdomen soft and non tender Skin. No rashes Musculoskeletal: shortened and internally rotated right leg.      Data Reviewed: I have personally reviewed following labs and imaging studies  CBC: Recent Labs  Lab 03/16/21 0140  WBC 8.7  NEUTROABS 7.1  HGB 13.1  HCT 38.5  MCV 101.6*  PLT 253   Basic Metabolic Panel: No results for input(s): NA, K, CL, CO2, GLUCOSE, BUN, CREATININE, CALCIUM, MG, PHOS in the last 168 hours. GFR: CrCl cannot be calculated (Patient's most recent lab result is older than the maximum 21 days allowed.). Liver Function Tests: No results for input(s): AST, ALT, ALKPHOS, BILITOT, PROT, ALBUMIN in the last 168 hours. No results for input(s): LIPASE, AMYLASE in the last 168 hours. No results for input(s): AMMONIA in the last 168 hours. Coagulation Profile: No results for input(s): INR, PROTIME in the last 168 hours. Cardiac Enzymes: No results for input(s): CKTOTAL, CKMB, CKMBINDEX, TROPONINI in the last 168 hours. BNP (last 3 results) No results for input(s): PROBNP in the last 8760 hours. HbA1C: No results for input(s): HGBA1C in the last 72 hours. CBG: No results for input(s): GLUCAP in the last 168 hours. Lipid Profile: No results for input(s): CHOL, HDL, LDLCALC, TRIG, CHOLHDL, LDLDIRECT in the last 72 hours. Thyroid Function Tests: No results for input(s): TSH, T4TOTAL, FREET4, T3FREE, THYROIDAB in the last 72 hours. Anemia Panel: No results for input(s): VITAMINB12, FOLATE, FERRITIN, TIBC, IRON, RETICCTPCT in the last 72 hours.    Radiology Studies: I have reviewed all of the imaging during this hospital visit  personally     Scheduled Meds:  amLODipine  10 mg Oral Daily   carvedilol  3.125 mg Oral BID WC   docusate sodium  100 mg Oral BID   enoxaparin (LOVENOX) injection  40 mg Subcutaneous Q24H   losartan  100 mg Oral Daily   methimazole  5 mg Oral Daily   pantoprazole  40 mg Oral Daily   pravastatin  10 mg Oral QHS   Continuous Infusions:   LOS: 1 day        Alivea Gladson Gerome Apley, MD

## 2021-03-16 NOTE — TOC CAGE-AID Note (Signed)
Transition of Care [facility]) - CAGE-AID Screening   Patient Details  Name: Valerie [name] MRN: [id number] Date of Birth: March 01, 1928  Clinical Narrative:  Patient denies any alcohol or drug use, no need for resources at this time.  CAGE-AID Screening:    Have You Ever Felt You Ought to Cut Down on Your Drinking or Drug Use?: No Have People Annoyed You By Critizing Your Drinking Or Drug Use?: No Have You Felt Bad Or Guilty About Your Drinking Or Drug Use?: No Have You Ever Had a Drink or Used Drugs First Thing In The Morning to Steady Your Nerves or to Get Rid of a Hangover?: No CAGE-AID Score: 0  Substance Abuse Education Offered: No

## 2021-03-17 LAB — CBC WITH DIFFERENTIAL/PLATELET
Abs Immature Granulocytes: 0.02 10*3/uL (ref 0.00–0.07)
Basophils Absolute: 0 10*3/uL (ref 0.0–0.1)
Basophils Relative: 0 %
Eosinophils Absolute: 0 10*3/uL (ref 0.0–0.5)
Eosinophils Relative: 0 %
HCT: 32 % — ABNORMAL LOW (ref 36.0–46.0)
Hemoglobin: 10.9 g/dL — ABNORMAL LOW (ref 12.0–15.0)
Immature Granulocytes: 0 %
Lymphocytes Relative: 7 %
Lymphs Abs: 0.5 10*3/uL — ABNORMAL LOW (ref 0.7–4.0)
MCH: 34.4 pg — ABNORMAL HIGH (ref 26.0–34.0)
MCHC: 34.1 g/dL (ref 30.0–36.0)
MCV: 100.9 fL — ABNORMAL HIGH (ref 80.0–100.0)
Monocytes Absolute: 0.3 10*3/uL (ref 0.1–1.0)
Monocytes Relative: 4 %
Neutro Abs: 6.4 10*3/uL (ref 1.7–7.7)
Neutrophils Relative %: 89 %
Platelets: 212 10*3/uL (ref 150–400)
RBC: 3.17 MIL/uL — ABNORMAL LOW (ref 3.87–5.11)
RDW: 11.7 % (ref 11.5–15.5)
WBC: 7.2 10*3/uL (ref 4.0–10.5)
nRBC: 0 % (ref 0.0–0.2)

## 2021-03-17 LAB — BASIC METABOLIC PANEL
Anion gap: 10 (ref 5–15)
BUN: 29 mg/dL — ABNORMAL HIGH (ref 8–23)
CO2: 23 mmol/L (ref 22–32)
Calcium: 8.6 mg/dL — ABNORMAL LOW (ref 8.9–10.3)
Chloride: 100 mmol/L (ref 98–111)
Creatinine, Ser: 1 mg/dL (ref 0.44–1.00)
GFR, Estimated: 53 mL/min — ABNORMAL LOW (ref >60)
Glucose, Bld: 135 mg/dL — ABNORMAL HIGH (ref 70–99)
Potassium: 4.2 mmol/L (ref 3.5–5.1)
Sodium: 133 mmol/L — ABNORMAL LOW (ref 135–145)

## 2021-03-17 LAB — SARS CORONAVIRUS 2 (TAT 6-24 HRS): SARS Coronavirus 2: NEGATIVE

## 2021-03-17 MED ORDER — APIXABAN 2.5 MG PO TABS
2.5000 mg | ORAL_TABLET | Freq: Two times a day (BID) | ORAL | Status: DC
  Administered 2021-03-17 – 2021-03-20 (×7): 2.5 mg via ORAL
  Filled 2021-03-17 (×7): qty 1

## 2021-03-17 NOTE — Progress Notes (Signed)
Inpatient Rehab Admissions Coordinator Note:   Per OT recommendations, pt was screened for CIR candidacy by Shann Medal, PT, DPT.  At this time we are recommending a CIR consult and I will place an order per our protocol.  Note that [facility] does not typically approve CIR for diagnosis of hip fracture, but given pt's age may be worth a try.  Please contact me with questions.   Shann Medal, PT, DPT [phone] 03/17/21 3:29 PM

## 2021-03-17 NOTE — Progress Notes (Signed)
Appreciate OT and PT eval. And work.  CIR recommendation noted from therapy.   Continue ambulation and mobility.  Posterior hip precautions.

## 2021-03-17 NOTE — Plan of Care (Signed)

## 2021-03-17 NOTE — Progress Notes (Signed)
Occupational Therapy Evaluation Patient Details Name: Valerie [name] MRN: [id number] DOB: [birth year] Today's Date: 03/17/2021    History of Present Illness Valerie [name] is an 87 y.o. female who presented to [facility] in [place] after a mechanical fall where x-rays demonstrated impacted femoral neck fracture, pt transferred to [facility] 6/18. S/p POSTERIOR ARTHROPLASTY BIPOLAR HIP (HEMIARTHROPLASTY) (Right) 6/19. PMH: atrial fibs flutter on Eliquis at the time of her follow-up with chronic A. fib, hypothyroidism, depression, anxiety, HCC, Glaucoma   Clinical Impression   Valerie [name] was evaluated s/p the above R posterior hip hemi. PTA pt was indep with all ADLs and had assistance for cooking and heavy housework, she does not drive due to glaucoma. Her grandson lives with her, and she has family near by who will give her 24/7 care; she has 3 STE, one level home. Upon evaluation pt's cognition was altered, suspect due to her medications, she had difficulty recalling her PLOF and following simple commands. Pt was mod/max for all bed mobility and max A for sit<>stand. Pt may benefit from +2 assistance for ambulation depending on mental state. Educated pt and her son on posterior hip precautions. Pt nefits from coninuted OT acutely to progress ADLs and mobility while maintaining precautions. Recommend CIR level rehab at d/c to progress function in all ADLs and mobility prior to home.     Follow Up Recommendations  Supervision/Assistance - 24 hour;CIR    Equipment Recommendations  Tub/shower bench;Other (comment) (RW with 2 wheels)       Precautions / Restrictions Precautions Precautions: Posterior Hip Precaution Booklet Issued: No Precaution Comments: verbally reviewed 3/3 precautions Required Braces or Orthoses: Knee Immobilizer - Right (verbal orders from MD to remove knee immobilizer after posterior hip precautions have been reviewed) Knee Immobilizer - Right: Other (comment) (knee immobilizer donned the  entire session due to pt cognition altered from pain medication) Restrictions Weight Bearing Restrictions: Yes RLE Weight Bearing: Weight bearing as tolerated      Mobility Bed Mobility Overal bed mobility: Needs Assistance Bed Mobility: Supine to Sit;Sit to Supine     Supine to sit: Max assist;HOB elevated Sit to supine: Max assist;HOB elevated   General bed mobility comments: one step verbal cues, and max A for physcial movement    Transfers Overall transfer level: Needs assistance   Transfers: Sit to/from Stand Sit to Stand: Max assist;From elevated surface         General transfer comment: max A to boost into standing, mod A for balance in standing    Balance Overall balance assessment: Needs assistance Sitting-balance support: Feet supported;Single extremity supported Sitting balance-Leahy Scale: Poor     Standing balance support: Bilateral upper extremity supported Standing balance-Leahy Scale: Poor             ADL either performed or assessed with clinical judgement   ADL Overall ADL's : Needs assistance/impaired Eating/Feeding: Set up;Sitting   Grooming: Wash/dry hands;Wash/dry face;Oral care;Applying deodorant;Sitting;Set up   Upper Body Bathing: Sitting;Set up   Lower Body Bathing: Maximal assistance;Adhering to hip precautions;Cueing for safety;Cueing for compensatory techniques;Sit to/from stand   Upper Body Dressing : Set up;Sitting   Lower Body Dressing: Maximal assistance;Adhering to hip precautions;Cueing for safety;Cueing for sequencing;Sit to/from stand   Toilet Transfer: Maximal assistance;Stand-pivot;BSC;Cueing for safety;Cueing for sequencing;Adhering to hip precautions   Toileting- Clothing Manipulation and Hygiene: Maximal assistance;Sit to/from stand;Adhering to hip precautions       Functional mobility during ADLs: Maximal assistance;Cueing for safety;Cueing for sequencing General ADL Comments: Assistance  for sit<>stand  transitions and BUE reliant on external support; pt with difficulty sequencing and driection following this session     Vision Baseline Vision/History: Glaucoma Patient Visual Report: No change from baseline Vision Assessment?: Vision impaired- to be further tested in functional context Additional Comments: pt with baseline glaucoma - unable to read clock in room, from her bed accurately            Pertinent Vitals/Pain Pain Assessment: Faces Faces Pain Scale: Hurts a little bit Pain Location: R hip Pain Descriptors / Indicators: Guarding;Grimacing Pain Intervention(s): Limited activity within patient's tolerance     Hand Dominance Right   Extremity/Trunk Assessment Upper Extremity Assessment Upper Extremity Assessment: Generalized weakness;Overall [facility] for tasks assessed   Lower Extremity Assessment Lower Extremity Assessment: Defer to PT evaluation (R posterior hip)   Cervical / Trunk Assessment Cervical / Trunk Assessment: Kyphotic   Communication Communication Communication: No difficulties   Cognition Arousal/Alertness: Suspect due to medications Behavior During Therapy: Anxious;Flat affect Overall Cognitive Status: Impaired/Different from baseline Area of Impairment: Memory;Following commands;Safety/judgement;Awareness;Problem solving;Attention             Memory: Decreased recall of precautions;Decreased short-term memory Following Commands: Follows one step commands inconsistently Safety/Judgement: Decreased awareness of safety Awareness: Emergent Problem Solving: Slow processing;Decreased initiation;Requires verbal cues;Requires tactile cues General Comments: Pt with reports of feeling loopy, suspect from medications - pt's son confirmed her cognition is different from baseline at this time   General Comments  verbally reviewed post hip precautions, took nasal canual off & pt desatted to 87% without recovery while sitting EOB - donned O2 nasal canula at 2L &  SpO2 94-94% for the remainder of the session     Home Living Family/patient expects to be discharged to:: Private residence Living Arrangements: Other relatives Available Help at Discharge: Family;Available 24 hours/day Type of Home: House Home Access: Stairs to enter [employer] of Steps: 3 Entrance Stairs-Rails: None Home Layout: One level     Bathroom Shower/Tub: Occupational psychologist: Standard Bathroom Accessibility: Yes   Home Equipment: Cane - single point;Wheelchair - [employer] - 4 wheels;Grab bars - tub/shower   Additional Comments: Grandson lives with pt, able "most of the time," per pt's son, pt will have 24/7 support at d/c      Prior Functioning/Environment Level of Independence: Independent with assistive device(s)        Comments: rarely used a Programmer, multimedia; does not cook due to poor vision, has family assistance for heavy housework        OT Problem List: Decreased range of motion;Decreased activity tolerance;Impaired balance (sitting and/or standing);Decreased safety awareness;Decreased knowledge of use of DME or AE;Decreased knowledge of precautions;Pain      OT Treatment/Interventions: Self-care/ADL training;Therapeutic exercise;Therapeutic activities;Balance training;Patient/family education    OT Goals(Current goals can be found in the care plan section) Acute Rehab OT Goals Patient Stated Goal: to get up and walk OT Goal Formulation: With patient Time For Goal Achievement: 03/31/21 Potential to Achieve Goals: Fair ADL Goals Pt Will Perform Lower Body Bathing: with min guard assist;with adaptive equipment;sit to/from stand Pt Will Perform Lower Body Dressing: with min assist;with adaptive equipment;sit to/from stand Pt Will Transfer to Toilet: with min guard assist;ambulating Pt Will Perform Toileting - Clothing Manipulation and hygiene: with min assist;sit to/from stand Pt Will Perform Tub/Shower Transfer: with min  assist;tub bench;rolling walker Additional ADL Goal #1: Pt will independently recall all R posterior hip precautions to allow for safe functional mobility.  OT Frequency:  Min 2X/week    AM-PAC OT "6 Clicks" Daily Activity     Outcome Measure Help from another person eating meals?: A Little Help from another person taking care of personal grooming?: A Little Help from another person toileting, which includes using toliet, bedpan, or urinal?: A Lot Help from another person bathing (including washing, rinsing, drying)?: A Lot Help from another person to put on and taking off regular upper body clothing?: A Little Help from another person to put on and taking off regular lower body clothing?: A Lot 6 Click Score: 15   End of Session Equipment Utilized During Treatment: Gait belt;Right knee immobilizer Nurse Communication: Mobility status;Precautions;Weight bearing status  Activity Tolerance: Patient tolerated treatment well Patient left: in bed;with call bell/phone within reach;with bed alarm set;with family/visitor present  OT Visit Diagnosis: Unsteadiness on feet (R26.81);Muscle weakness (generalized) (M62.81);History of falling (Z91.81);Pain Pain - Right/Left: Right Pain - part of body: Hip                Time: 0920-1000 OT Time Calculation (min): 40 min Charges:  OT General Charges $OT Visit: 1 Visit OT Evaluation $OT Eval Moderate Complexity: 1 Mod OT Treatments $Therapeutic Activity: 23-37 mins   Eboney Claybrook A Danielle Mink 03/17/2021, 10:46 AM

## 2021-03-17 NOTE — Evaluation (Addendum)
Physical Therapy Evaluation Patient Details Name: Valerie [name] MRN: [id number] DOB: May 16, 1928 Today's Date: 03/17/2021   History of Present Illness  Valerie [name] is an 87 y.o. female who presented to [facility] in [place] after a mechanical fall where x-rays demonstrated impacted femoral neck fracture, pt transferred to [facility] 6/18. S/p POSTERIOR ARTHROPLASTY BIPOLAR HIP (HEMIARTHROPLASTY) (Right) 6/19. PMH: atrial fibs flutter on Eliquis at the time of her follow-up with chronic A. fib, hypothyroidism, depression, anxiety, HCC, Glaucoma  Clinical Impression  Pt was much more alert and participative during my session this PM.  She was able to get up OOB and transfer to the recliner chair with one person heavy min assist.  Pt will do well with acute and post acute therapy.  She has a very supportive family.   PT to follow acutely for deficits listed below.     Follow Up Recommendations CIR    Equipment Recommendations  Rolling walker with 5" wheels;3in1 (PT);Hospital bed    Recommendations for Other Services       Precautions / Restrictions Precautions Precautions: Posterior Hip Precaution Booklet Issued: No Precaution Comments: verbally reviewed with pt and son, pt could report 1/3 Required Braces or Orthoses: Knee Immobilizer - Right (for positioning in bed) Restrictions RLE Weight Bearing: Weight bearing as tolerated      Mobility  Bed Mobility Overal bed mobility: Needs Assistance Bed Mobility: Supine to Sit     Supine to sit: Min assist;HOB elevated     General bed mobility comments: min assist to help progress right leg over EOB, min assist to separately help support trunk to come to sitting EOB, cues for sequencing, 1/2 bridge and hand placement.    Transfers Overall transfer level: Needs assistance Equipment used: Rolling walker (2 wheeled) Transfers: Sit to/from [facility] Sit to Stand: Min assist;From elevated surface Stand pivot transfers: Min  assist;From elevated surface       General transfer comment: Heavy min assist to support trunk mostly when WB through right leg (buckling moment in WB).  Assist to support trunk when stepping and to help steer RW.  Mod assist to lower to sitting in low recliner chair.  Ambulation/Gait                Stairs            Wheelchair Mobility    Modified Rankin (Stroke Patients Only)       Balance Overall balance assessment: Needs assistance Sitting-balance support: Feet supported;Bilateral upper extremity supported Sitting balance-Leahy Scale: Poor Sitting balance - Comments: light min assist EOB due to posterior preference, reliance on bil UE support to maintain sitting EOB.   Standing balance support: Bilateral upper extremity supported Standing balance-Leahy Scale: Poor                               Pertinent Vitals/Pain Pain Assessment: Faces Faces Pain Scale: Hurts little more Pain Location: R hip Pain Descriptors / Indicators: Grimacing;Guarding Pain Intervention(s): Limited activity within patient's tolerance;Monitored during session;Repositioned    Home Living Family/patient expects to be discharged to:: Private residence Living Arrangements: Other relatives Available Help at Discharge: Family;Available 24 hours/day Type of Home: House Home Access: Stairs to enter Entrance Stairs-Rails: None Entrance Stairs-Number of Steps: 3 Home Layout: One level Home Equipment: Cane - single point;Wheelchair - [employer] - 4 wheels;Grab bars - tub/shower Additional Comments: Grandson lives with pt, able "most of the time," per  pt's son, pt will have 24/7 support at d/c    Prior Function Level of Independence: Independent with assistive device(s)         Comments: rarely used a Programmer, multimedia; does not cook due to poor vision, has family assistance for heavy housework     Hand Dominance   Dominant Hand: Right    Extremity/Trunk Assessment    Upper Extremity Assessment Upper Extremity Assessment: Defer to OT evaluation    Lower Extremity Assessment Lower Extremity Assessment: RLE deficits/detail RLE Deficits / Details: right leg with normal post op pain and weakness, ankle 3/5, knee 3-/5 hip 2/5    Cervical / Trunk Assessment Cervical / Trunk Assessment: Kyphotic  Communication   Communication: HOH;Other (comment) (mildly HOH)  Cognition Arousal/Alertness: Awake/alert Behavior During Therapy: WFL for tasks assessed/performed Overall Cognitive Status: Impaired/Different from baseline Area of Impairment: Memory                     Memory: Decreased recall of precautions;Decreased short-term memory         General Comments: pt seems cloer to her normal baseline.      General Comments General comments (skin integrity, edema, etc.): O2 sats in the 90s on RA throughout, so left on RA at end of session (only on 1/2 a L at the beginning of the session).    Exercises Total Joint Exercises Ankle Circles/Pumps: AROM;Both;5 reps Heel Slides: AAROM;Right;5 reps Hip ABduction/ADduction: AAROM;Right;5 reps Other Exercises Other Exercises: incentive spirometer x 10 reps, cues for speed (pt cannot see when blue device is all the way down at the bottom).   Assessment/Plan    PT Assessment Patient needs continued PT services  PT Problem List Decreased strength;Decreased range of motion;Decreased activity tolerance;Decreased balance;Decreased mobility;Decreased knowledge of use of DME;Decreased knowledge of precautions;Pain       PT Treatment Interventions DME instruction;Gait training;Stair training;Functional mobility training;Therapeutic activities;Therapeutic exercise;Balance training;Patient/family education;Manual techniques;Modalities    PT Goals (Current goals can be found in the Care Plan section)  Acute Rehab PT Goals Patient Stated Goal: to get back home PT Goal Formulation: With patient/family Time For  Goal Achievement: 03/31/21 Potential to Achieve Goals: Good    Frequency Min 5X/week   Barriers to discharge        Co-evaluation               AM-PAC PT "6 Clicks" Mobility  Outcome Measure Help needed turning from your back to your side while in a flat bed without using bedrails?: A Little Help needed moving from lying on your back to sitting on the side of a flat bed without using bedrails?: A Little Help needed moving to and from a bed to a chair (including a wheelchair)?: A Little Help needed standing up from a chair using your arms (e.g., wheelchair or bedside chair)?: A Little Help needed to walk in hospital room?: A Lot Help needed climbing 3-5 steps with a railing? : Total 6 Click Score: 15    End of Session Equipment Utilized During Treatment: Gait belt Activity Tolerance: Patient limited by pain Patient left: in chair;with call bell/phone within reach;with chair alarm set;with family/visitor present Nurse Communication: Mobility status PT Visit Diagnosis: Muscle weakness (generalized) (M62.81);Difficulty in walking, not elsewhere classified (R26.2);Pain Pain - Right/Left: Right Pain - part of body: Hip    Time: 6433-2951 PT Time Calculation (min) (ACUTE ONLY): 48 min   Charges:   PT Evaluation $PT Eval Moderate Complexity: 1 Mod PT Treatments $Therapeutic Activity:  23-37 mins        Verdene Lennert, PT, DPT  Acute Rehabilitation Ortho Tech Supervisor [phone] pager [phone] office

## 2021-03-17 NOTE — Progress Notes (Signed)
Initial Nutrition Assessment  DOCUMENTATION CODES:   Non-severe (moderate) malnutrition in context of chronic illness  INTERVENTION:   -Ensure Enlive po BID, each supplement provides 350 kcal and 20 grams of protein  -Magic cup TID with meals, each supplement provides 290 kcal and 9 grams of protein  -MVI with minerals daily  NUTRITION DIAGNOSIS:   Moderate Malnutrition related to chronic illness (a-fib, hyperthroidism) as evidenced by mild muscle depletion, moderate muscle depletion.  GOAL:   Patient will meet greater than or equal to 90% of their needs  MONITOR:   PO intake, Supplement acceptance, Labs, Weight trends, Skin, I & O's  REASON FOR ASSESSMENT:   Consult Assessment of nutrition requirement/status  ASSESSMENT:   Valerie [name] is a 87 y.o. female with medical history significant of chronic A. fib/A-flutter on Eliquis, HTN, HLD, hyperthyroid, came with mechanical fall and right hip fracture.  Pt admitted with rt hip fracture s/p fall.   6/19- s/p Procedure: Right hip hemiarthroplasty, cemented.  Reviewed UI/O's: +866 ml x 24 hours and +586 ml since admission  UOP: 200 ml x 24 hours  Spoke with pt and son at bedside. Pt reports poor oral intake post-operatively, secondary to pain. Observed breakfast tray; pt consumed less than 25%. Per pt son, pt has had a poor appetite for everything except sweets for the past 3-4 years. Per pt, she generally consumes 3 meals per day (Breakfast: oatmeal; Lunch and Dinner: food that is brought by family members). She also consumes on Ensure supplement daily.   Pt is unsure of UBW and how much weight she may have lost. No recent wt available to assess wt changes.   Discussed importance of good meal and supplement intake to promote healing. Pt amenable to Ensure and Magic Cups.   Medications reviewed and include colace.   Labs reviewed: Na: 133.    NUTRITION - FOCUSED PHYSICAL EXAM:  Flowsheet Row Most Recent Value  Orbital  Region Mild depletion  Upper Arm Region Mild depletion  Thoracic and Lumbar Region No depletion  Buccal Region No depletion  Temple Region Mild depletion  Clavicle Bone Region Moderate depletion  Clavicle and Acromion Bone Region Moderate depletion  Scapular Bone Region Moderate depletion  Dorsal Hand Mild depletion  Patellar Region Moderate depletion  Anterior Thigh Region Moderate depletion  Posterior Calf Region Moderate depletion  Edema (RD Assessment) None  Hair Reviewed  Eyes Reviewed  Mouth Reviewed  Skin Reviewed  Nails Reviewed       Diet Order:   Diet Order             Diet Heart Room service appropriate? Yes; Fluid consistency: Thin  Diet effective now                   EDUCATION NEEDS:   Education needs have been addressed  Skin:  Skin Assessment: Skin Integrity Issues: Skin Integrity Issues:: Incisions Incisions: closed rt hip  Last BM:  03/14/21  Height:   Ht Readings from Last 1 Encounters:  01/24/21 5\' 4"  (1.626 m)    Weight:   Wt Readings from Last 1 Encounters:  01/24/21 49.4 kg    Ideal Body Weight:  54.5 kg  BMI:  There is no height or weight on file to calculate BMI.  Estimated Nutritional Needs:   Kcal:  1450-1650  Protein:  70-85 grams  Fluid:  > 1.4 L    Loistine Chance, RD, LDN, [place] Registered Dietitian II Certified Diabetes Care and Education Specialist Please  refer to [facility] for RD and/or RD on-call/weekend/after hours pager

## 2021-03-17 NOTE — Progress Notes (Signed)
PROGRESS NOTE    Valerie [name]  AOZ:[id number] DOB: Nov 29, 1927 DOA: 03/15/2021 PCP: Valerie Pretty, FNP    Brief Narrative:  Valerie [name] was admitted to the hospital with the working diagnosis of right femoral neck fracture.   87 year old female with past medical history for chronic atrial fibrillation/flutter, hypertension, dyslipidemia, glaucoma and hyperthyroid who presented after a mechanical fall.  She usually ambulates without help, no need of a walker or cane.  She does have glaucoma limiting her mobility.  2 days prior to hospitalization she stepped on a puddle of water in the bathroom and fell on her right hip producing severe pain and amatory dysfunction.  Due to persistent symptoms EMS was called and patient was taken to [facility], she was diagnosed with right femoral neck fracture and was transferred to [facility] for further orthopedic evaluation. At the time of her transfer her blood pressure was 134/54, heart rate 75, temperature 98.3, oxygen saturation 99%.  Her lungs are clear to auscultation, heart S1-S2, present, rhythmic, 3-6 systolic murmur at the apex, abdomen soft nontender, no lower extremity edema, right leg with shortened and internally rotated.   White blood cell count 8.7, hemoglobin 13.1, hematocrit 38.5, platelets 227. 6/16 SARS COVID-19 negative. Sodium 136, potassium 4.0, chloride 103, bicarb 26, BUN 29, creatinine 1.17. Urinalysis specific gravity 1.015, negative nitrates.   Right pelvic x-rays with impacted right femoral neck fracture. EKG 80 bpm, sinus rhythm.  Underwent right hemiarthroplasty on 03/16/21    Assessment & Plan:   Principal Problem:   Femoral neck fracture (HCC) Active Problems:   Iron deficiency anemia   Glaucoma   Hyperlipidemia   Atrial fibrillation (HCC)   Aortic insufficiency   Essential hypertension    Right femoral neck fracture. Patient tolerated procedure well, this am her pain is well controlled while in bed,  but is worse with movement. No nausea or vomiting, no chest pain or dyspnea.    Plan to to continue pain control (hydrocodone and morphine), follow with Orthopedics surgery, PT and OT recommendations.    2.   Paroxysmal atrial fibrillation. EKG at [facility] reported to be sinus rhythm. Continue rate control with carvedilol. Resume apixaban when [facility] with orthopedic surgery.    3. HTN/ dyslipidemia. Continue to hold on antihypertensive medications, except carvedilol.   On pravastatin.   4. Hyperthyroid. On methimazole.   5. Iron deficiency anemia. Stable cell count with Hgb at 10,9 and Hct at 32.0.  Continue close follow up on cell count.     Status is: Inpatient  Remains inpatient appropriate because:Inpatient level of care appropriate due to severity of illness  Dispo: The patient is from: Home              Anticipated d/c is to: SNF              Patient currently is not medically stable to d/c.   Difficult to place patient No    DVT prophylaxis: Scd   Code Status:   full  Family Communication:  I spoke over the phone with the patient's son about patient's  condition, plan of care, prognosis and all questions were addressed.     Consultants:  Orthopedics   Procedures:  Right hip hemiarthroplasty 06/19   Subjective: Patient is feeling better, no nausea or vomiting, her pain is well controlled while at rest but worse with movement, no nausea or vomiting, no chest pain or dyspnea.   Objective: Vitals:   03/16/21 1523 03/16/21 1547 03/16/21 2044  03/17/21 0700  BP: (!) 122/59 (!) 115/51 110/60 (!) 119/53  Pulse: 77 72 76 67  Resp: 15 16 17 18   Temp: (!) 97.5 F (36.4 C) 97.9 F (36.6 C) 97.8 F (36.6 C) 98.7 F (37.1 C)  TempSrc:  Oral Oral Oral  SpO2: 98% 96% 94% 98%    Intake/Output Summary (Last 24 hours) at 03/17/2021 1779 Last data filed at 03/16/2021 1717 Gross per 24 hour  Intake 1166.04 ml  Output 300 ml  Net 866.04 ml   There were no vitals filed for this  visit.  Examination:   General: Not in pain or dyspnea, deconditioned  Neurology: Awake and alert, non focal  E ENT: mild pallor, no icterus, oral mucosa moist Cardiovascular: No JVD. S1-S2 present, rhythmic, no gallops.   No lower extremity edema. Pulmonary: positive breath sounds bilaterally, with no wheezing, rhonchi or rales. Gastrointestinal. Abdomen soft and non tender Skin. No rashes Musculoskeletal: no joint deformities     Data Reviewed: I have personally reviewed following labs and imaging studies  CBC: Recent Labs  Lab 03/16/21 0140 03/17/21 0322  WBC 8.7 7.2  NEUTROABS 7.1 6.4  HGB 13.1 10.9*  HCT 38.5 32.0*  MCV 101.6* 100.9*  PLT 227 390   Basic Metabolic Panel: Recent Labs  Lab 03/17/21 0322  NA 133*  K 4.2  CL 100  CO2 23  GLUCOSE 135*  BUN 29*  CREATININE 1.00  CALCIUM 8.6*   GFR: CrCl cannot be calculated (Unknown ideal weight.). Liver Function Tests: No results for input(s): AST, ALT, ALKPHOS, BILITOT, PROT, ALBUMIN in the last 168 hours. No results for input(s): LIPASE, AMYLASE in the last 168 hours. No results for input(s): AMMONIA in the last 168 hours. Coagulation Profile: No results for input(s): INR, PROTIME in the last 168 hours. Cardiac Enzymes: No results for input(s): CKTOTAL, CKMB, CKMBINDEX, TROPONINI in the last 168 hours. BNP (last 3 results) No results for input(s): PROBNP in the last 8760 hours. HbA1C: No results for input(s): HGBA1C in the last 72 hours. CBG: No results for input(s): GLUCAP in the last 168 hours. Lipid Profile: No results for input(s): CHOL, HDL, LDLCALC, TRIG, CHOLHDL, LDLDIRECT in the last 72 hours. Thyroid Function Tests: No results for input(s): TSH, T4TOTAL, FREET4, T3FREE, THYROIDAB in the last 72 hours. Anemia Panel: No results for input(s): VITAMINB12, FOLATE, FERRITIN, TIBC, IRON, RETICCTPCT in the last 72 hours.    Radiology Studies: I have reviewed all of the imaging during this  hospital visit personally     Scheduled Meds:  carvedilol  3.125 mg Oral BID WC   docusate sodium  100 mg Oral BID   methimazole  5 mg Oral Daily   naproxen  250 mg Oral BID WC   pantoprazole  40 mg Oral Daily   pravastatin  10 mg Oral QHS   Continuous Infusions:  sodium chloride 75 mL/hr at 03/17/21 0648     LOS: 2 days        Parlee Amescua Gerome Apley, MD

## 2021-03-18 ENCOUNTER — Encounter (HOSPITAL_COMMUNITY): Payer: Self-pay | Admitting: Orthopaedic Surgery

## 2021-03-18 DIAGNOSIS — E440 Moderate protein-calorie malnutrition: Secondary | ICD-10-CM | POA: Insufficient documentation

## 2021-03-18 LAB — BASIC METABOLIC PANEL
Anion gap: 13 (ref 5–15)
BUN: 37 mg/dL — ABNORMAL HIGH (ref 8–23)
CO2: 22 mmol/L (ref 22–32)
Calcium: 8.7 mg/dL — ABNORMAL LOW (ref 8.9–10.3)
Chloride: 103 mmol/L (ref 98–111)
Creatinine, Ser: 0.91 mg/dL (ref 0.44–1.00)
GFR, Estimated: 60 mL/min — ABNORMAL LOW (ref >60)
Glucose, Bld: 88 mg/dL (ref 70–99)
Potassium: 4.1 mmol/L (ref 3.5–5.1)
Sodium: 138 mmol/L (ref 135–145)

## 2021-03-18 LAB — CBC
HCT: 29.3 % — ABNORMAL LOW (ref 36.0–46.0)
Hemoglobin: 10.2 g/dL — ABNORMAL LOW (ref 12.0–15.0)
MCH: 34.9 pg — ABNORMAL HIGH (ref 26.0–34.0)
MCHC: 34.8 g/dL (ref 30.0–36.0)
MCV: 100.3 fL — ABNORMAL HIGH (ref 80.0–100.0)
Platelets: 222 10*3/uL (ref 150–400)
RBC: 2.92 MIL/uL — ABNORMAL LOW (ref 3.87–5.11)
RDW: 12 % (ref 11.5–15.5)
WBC: 8.1 10*3/uL (ref 4.0–10.5)
nRBC: 0 % (ref 0.0–0.2)

## 2021-03-18 MED ORDER — FLUTICASONE PROPIONATE 50 MCG/ACT NA SUSP
2.0000 | Freq: Every day | NASAL | Status: DC
  Administered 2021-03-19 – 2021-03-20 (×2): 2 via NASAL
  Filled 2021-03-18: qty 16

## 2021-03-18 NOTE — Progress Notes (Signed)
Subjective: 2 Days Post-Op Procedure(s) (LRB): POSTERIOR ARTHROPLASTY BIPOLAR HIP (HEMIARTHROPLASTY) (Right) Pain controlled right hip.    Objective: Vital signs in last 24 hours: Temp:  [97.9 F (36.6 C)-98.4 F (36.9 C)] 98 F (36.7 C) (06/21 0700) Pulse Rate:  [57-66] 58 (06/21 0700) Resp:  [16-18] 18 (06/21 0700) BP: (101-125)/(42-51) 119/42 (06/21 0700) SpO2:  [98 %-100 %] 100 % (06/21 0700)  Intake/Output from previous day: 06/20 0701 - 06/21 0700 In: 360 [P.O.:360] Out: -  Intake/Output this shift: No intake/output data recorded.  Recent Labs    03/16/21 0140 03/17/21 0322 03/18/21 0427  HGB 13.1 10.9* 10.2*   Recent Labs    03/17/21 0322 03/18/21 0427  WBC 7.2 8.1  RBC 3.17* 2.92*  HCT 32.0* 29.3*  PLT 212 222   Recent Labs    03/17/21 0322 03/18/21 0427  NA 133* 138  K 4.2 4.1  CL 100 103  CO2 23 22  BUN 29* 37*  CREATININE 1.00 0.91  GLUCOSE 135* 88  CALCIUM 8.6* 8.7*   No results for input(s): LABPT, INR in the last 72 hours.   Exam: Very pleasant female, alert and oriented. NAD.  Hip wound looks good.  Staples intact.  No drainage or signs of infection.  Calf nontender.    Assessment/Plan: 2 Days Post-Op Procedure(s) (LRB): POSTERIOR ARTHROPLASTY BIPOLAR HIP (HEMIARTHROPLASTY) (Right)  -PT recommending CIR.  Consult was put in.  I think this wound be a great option for patient and this was discussed with her and family member present.  Patient lives 50 miles away.  -continue present care     Benjiman Core 03/18/2021, 10:38 AM

## 2021-03-18 NOTE — Progress Notes (Signed)
Physical Therapy Treatment Patient Details Name: Valerie [name] MRN: [id number] DOB: May 19, 1928 Today's Date: 03/18/2021    History of Present Illness Valerie [name] is an 87 y.o. female who presented to [facility] in [place] after a mechanical fall where x-rays demonstrated impacted femoral neck fracture, pt transferred to [facility] 6/18. S/p POSTERIOR ARTHROPLASTY BIPOLAR HIP (HEMIARTHROPLASTY) (Right) 6/19. PMH: atrial fibs flutter on Eliquis at the time of her follow-up with chronic A. fib, hypothyroidism, depression, anxiety, HCC, Glaucoma.    PT Comments    Pt received in chair, daughter present, pt agreeable to therapy session and with good participation and tolerance for gait and transfer training. VSS on RA at rest so no O2 for mobility. Pt able to perform short gait task at bedside but distance limited due to fatigue/lightheadedness. BP stable seated EOB post-exertion and SpO2 desat to 87% with exertion at end of trial so 0.5L O2 [place] re-donned upon return to bed, RN aware. Pt able to recall 1/3 posterior hip precautions, will need reinforcement. Recommend AFO for RLE in bed to maintain neutral hip alignment and prevent pressure on R heel. Pt performed supine/seated LE exercises with good tolerance. Pt continues to benefit from PT services to progress toward functional mobility goals.   Follow Up Recommendations  CIR     Equipment Recommendations  Rolling walker with 5" wheels;3in1 (PT);Hospital bed    Recommendations for Other Services       Precautions / Restrictions Precautions Precautions: Posterior Hip Precaution Booklet Issued: Yes (comment) Precaution Comments: pt able to recall 1/3 precautions, handout in room and gave a copy to her daughter as well. Required Braces or Orthoses: Knee Immobilizer - Right (verbal orders from MD to remove KI after posterior hip precautions have been reviewed; recommended RN order her an AFO to assist with neutral hip position and offload heel while  resting) Knee Immobilizer - Right: Other (comment) (KI in room but no buckling with static standing/gait so kept off) Restrictions Weight Bearing Restrictions: Yes RLE Weight Bearing: Weight bearing as tolerated    Mobility  Bed Mobility Overal bed mobility: Needs Assistance Bed Mobility: Sit to Supine       Sit to supine: Mod assist;+2 for physical assistance   General bed mobility comments: modA for BLE mgmt into bed and some use of bed rails to perform; slow, frequent cues    Transfers Overall transfer level: Needs assistance Equipment used: Rolling walker (2 wheeled) Transfers: Sit to/from Stand Sit to Stand: Min assist;+2 safety/equipment         General transfer comment: Min assist +2 to support trunk mostly when WB through right leg and with posterior steps.  Assist to support trunk when stepping and to help steer RW.  Pt reports some "mild" dizziness after standing ~5 mins.  Ambulation/Gait Ambulation/Gait assistance: Min assist;+2 safety/equipment Gait Distance (Feet): 15 Feet (53ft to EOB from chair, then 42ft (58ft forward toward couch then turned back to bed) prior to sitting) Assistive device: Rolling walker (2 wheeled) Gait Pattern/deviations: Step-to pattern;Decreased weight shift to right;Decreased stride length;Decreased stance time - right;Shuffle;Narrow base of support     General Gait Details: cues needed for environmental awareness (pt with visual deficit) and some cues not to toe-in while turning, some tactile cues as well to maintain precautions. Distance limited due to pt report of dizziness, BP stable 124/57 (77) seated EOB after trial   Chief Strategy Officer  Modified Rankin (Stroke Patients Only)       Balance Overall balance assessment: Needs assistance Sitting-balance support: Feet supported;Bilateral upper extremity supported Sitting balance-Leahy Scale: Poor Sitting balance - Comments: light min assist EOB due  to posterior preference, reliance on bil UE support to maintain sitting EOB.   Standing balance support: Bilateral upper extremity supported Standing balance-Leahy Scale: Poor Standing balance comment: needs RW and +1-2 assist for stability                            Cognition Arousal/Alertness: Awake/alert Behavior During Therapy: WFL for tasks assessed/performed Overall Cognitive Status: Impaired/Different from baseline Area of Impairment: Memory                     Memory: Decreased recall of precautions;Decreased short-term memory Following Commands: Follows one step commands consistently;Follows one step commands with increased time Safety/Judgement: Decreased awareness of safety Awareness: Emergent Problem Solving: Slow processing;Decreased initiation;Requires verbal cues General Comments: pt seems closer to her normal baseline, somewhat HoH and visual deficits due to glaucoma so unable to see hip precautions handout well, daughter given a copy so she can review with pt.      Exercises Total Joint Exercises Ankle Circles/Pumps: AROM;Both;5 reps Heel Slides: AAROM;Right;5 reps Hip ABduction/ADduction: AAROM;Right;5 reps Long Arc Quad: Right;AAROM;10 reps;Seated Other Exercises Other Exercises: incentive spirometer x 5 reps, cues for speed (pt cannot see when blue device is all the way down at the bottom). Able to reach 750 mL.    General Comments General comments (skin integrity, edema, etc.): SpO2 93% on RA at rest then desat to 87% with exertion on RA so [place] re-donned after mobility (0.5L [place] and SpO2 98% resting after return to supine); Pt pulling up to 750 on IS x5 reps, encouraged hourly use.      Pertinent Vitals/Pain Pain Assessment: Faces Faces Pain Scale: Hurts a little bit Pain Location: R hip Pain Descriptors / Indicators: Grimacing;Guarding Pain Intervention(s): Limited activity within patient's tolerance;Monitored during session;Repositioned     Home Living                      Prior Function            PT Goals (current goals can now be found in the care plan section) Acute Rehab PT Goals Patient Stated Goal: to get back home PT Goal Formulation: With patient/family Time For Goal Achievement: 03/31/21 Progress towards PT goals: Progressing toward goals    Frequency    Min 5X/week      PT Plan Current plan remains appropriate    Co-evaluation              AM-PAC PT "6 Clicks" Mobility   Outcome Measure  Help needed turning from your back to your side while in a flat bed without using bedrails?: A Little Help needed moving from lying on your back to sitting on the side of a flat bed without using bedrails?: A Lot Help needed moving to and from a bed to a chair (including a wheelchair)?: A Little Help needed standing up from a chair using your arms (e.g., wheelchair or bedside chair)?: A Little Help needed to walk in hospital room?: A Lot Help needed climbing 3-5 steps with a railing? : A Lot 6 Click Score: 15    End of Session Equipment Utilized During Treatment: Gait belt;Oxygen (needed to re-don 0.5L O2 [place] post-exertion) Activity Tolerance:  Patient tolerated treatment well;Patient limited by fatigue Patient left: with call bell/phone within reach;with family/visitor present;in bed;with bed alarm set (B heels floated) Nurse Communication: Mobility status;Other (comment) (could benefit from R AFO to reinforce no toe-in precs and reduce pressure to R heel in bed) PT Visit Diagnosis: Muscle weakness (generalized) (M62.81);Difficulty in walking, not elsewhere classified (R26.2);Pain Pain - Right/Left: Right Pain - part of body: Hip     Time: 2162-4469 PT Time Calculation (min) (ACUTE ONLY): 34 min  Charges:  $Gait Training: 8-22 mins $Therapeutic Exercise: 8-22 mins                     Natori Gudino P., PTA Acute Rehabilitation Services Pager: [phone] Office: [place] 03/18/2021, 5:24 PM

## 2021-03-18 NOTE — Progress Notes (Signed)
Inpatient Rehab Admissions Coordinator:   Consult received. I met with patient and her daughter, [name], at the bedside to discuss recommendations of CIR.  I explained average length of stay to be about 2 weeks, dependent upon progress, and goals of supervision for mobility and ADLs.  [name] reports that between her and her 3 brothers this can be provided.  I let them know that [facility] requires prior authorization and they will not approve admit to SNF rehab from CIR.  All are in agreement to pursue CIR and I will start insurance authorization process today.    Shann Medal, PT, DPT Admissions Coordinator [phone] 03/18/21  11:27 AM

## 2021-03-18 NOTE — Progress Notes (Signed)
PROGRESS NOTE    Valerie [name]  OZH:[id number] DOB: May 20, 1928 DOA: 03/15/2021 PCP: Chevis Pretty, FNP    Brief Narrative:  Valerie [name] was admitted to the hospital with the working diagnosis of right femoral neck fracture.   87 year old female with past medical history for chronic atrial fibrillation/flutter, hypertension, dyslipidemia, glaucoma and hyperthyroid who presented after a mechanical fall.  She usually ambulates without help, no need of a walker or cane.  She does have glaucoma limiting her mobility.  2 days prior to hospitalization she stepped on a puddle of water in the bathroom and fell on her right hip producing severe pain and amatory dysfunction.  Due to persistent symptoms EMS was called and patient was taken to [facility], she was diagnosed with right femoral neck fracture and was transferred to [facility] for further orthopedic evaluation. At the time of her transfer her blood pressure was 134/54, heart rate 75, temperature 98.3, oxygen saturation 99%.  Her lungs are clear to auscultation, heart S1-S2, present, rhythmic, 3-6 systolic murmur at the apex, abdomen soft nontender, no lower extremity edema, right leg with shortened and internally rotated.   White blood cell count 8.7, hemoglobin 13.1, hematocrit 38.5, platelets 227. 6/16 SARS COVID-19 negative. Sodium 136, potassium 4.0, chloride 103, bicarb 26, BUN 29, creatinine 1.17. Urinalysis specific gravity 1.015, negative nitrates.   Right pelvic x-rays with impacted right femoral neck fracture. EKG 80 bpm, sinus rhythm.   Underwent right hemiarthroplasty on 03/16/21    Assessment & Plan:   Principal Problem:   Femoral neck fracture (HCC) Active Problems:   Iron deficiency anemia   Glaucoma   Hyperlipidemia   Atrial fibrillation (HCC)   Aortic insufficiency   Essential hypertension   Malnutrition of moderate degree   Right femoral neck fracture. pain is well controlled with analgesics,  hydrocodone and morphine. Continue with DVT prophylaxis and PT/OT  Plan for CIR.    2.   Paroxysmal atrial fibrillation. EKG at [facility] reported to be sinus rhythm. Continue rate control with carvedilol. Will resume anticoagulation with apixaban,     3. HTN/ dyslipidemia. On carvedilol with good toleration.  Continue with  pravastatin.   4. Hyperthyroid. on Methimazole.   5. Iron deficiency anemia. cell count continue to be stable    Status is: Inpatient  Remains inpatient appropriate because:Inpatient level of care appropriate due to severity of illness  Dispo: The patient is from: Home              Anticipated d/c is to: CIR              Patient currently is not medically stable to d/c.   Difficult to place patient No    DVT prophylaxis: Apixaban   Code Status:   full  Family Communication:  I spoke with patient's daughter at the bedside, we talked in detail about patient's condition, plan of care and prognosis and all questions were addressed.      Nutrition Status: Nutrition Problem: Moderate Malnutrition Etiology: chronic illness (a-fib, hyperthroidism) Signs/Symptoms: mild muscle depletion, moderate muscle depletion Interventions: Ensure Enlive (each supplement provides 350kcal and 20 grams of protein), Magic cup, MVI     Skin Documentation:     Consultants:  Orthopedics   Procedures:     Subjective: Patient is feeling better, her hip pain is controlled with analgesics, no nausea or vomiting, poor appetite.   Objective: Vitals:   03/17/21 1500 03/17/21 2105 03/18/21 0700 03/18/21 1500  BP: (!) 101/46 (!) 125/51 Marland Kitchen)  119/42 115/65  Pulse: (!) 57 66 (!) 58 66  Resp: 17 16 18 18   Temp: 98.4 F (36.9 C) 97.9 F (36.6 C) 98 F (36.7 C) 97.8 F (36.6 C)  TempSrc: Oral Oral Oral Oral  SpO2: 98% 98% 100% 98%   No intake or output data in the 24 hours ending 03/18/21 1731 There were no vitals filed for this visit.  Examination:   General: Not in pain  or dyspnea.  Neurology: Awake and alert, non focal  E ENT: no pallor, no icterus, oral mucosa moist Cardiovascular: No JVD. S1-S2 present, rhythmic, no gallops, rubs, or murmurs. No lower extremity edema. Pulmonary: vesicular breath sounds bilaterally, adequate air movement, no wheezing, rhonchi or rales. Gastrointestinal. Abdomen soft and non tender Skin. No rashes Musculoskeletal: no joint deformities     Data Reviewed: I have personally reviewed following labs and imaging studies  CBC: Recent Labs  Lab 03/16/21 0140 03/17/21 0322 03/18/21 0427  WBC 8.7 7.2 8.1  NEUTROABS 7.1 6.4  --   HGB 13.1 10.9* 10.2*  HCT 38.5 32.0* 29.3*  MCV 101.6* 100.9* 100.3*  PLT 227 212 778   Basic Metabolic Panel: Recent Labs  Lab 03/17/21 0322 03/18/21 0427  NA 133* 138  K 4.2 4.1  CL 100 103  CO2 23 22  GLUCOSE 135* 88  BUN 29* 37*  CREATININE 1.00 0.91  CALCIUM 8.6* 8.7*   GFR: CrCl cannot be calculated (Unknown ideal weight.). Liver Function Tests: No results for input(s): AST, ALT, ALKPHOS, BILITOT, PROT, ALBUMIN in the last 168 hours. No results for input(s): LIPASE, AMYLASE in the last 168 hours. No results for input(s): AMMONIA in the last 168 hours. Coagulation Profile: No results for input(s): INR, PROTIME in the last 168 hours. Cardiac Enzymes: No results for input(s): CKTOTAL, CKMB, CKMBINDEX, TROPONINI in the last 168 hours. BNP (last 3 results) No results for input(s): PROBNP in the last 8760 hours. HbA1C: No results for input(s): HGBA1C in the last 72 hours. CBG: No results for input(s): GLUCAP in the last 168 hours. Lipid Profile: No results for input(s): CHOL, HDL, LDLCALC, TRIG, CHOLHDL, LDLDIRECT in the last 72 hours. Thyroid Function Tests: No results for input(s): TSH, T4TOTAL, FREET4, T3FREE, THYROIDAB in the last 72 hours. Anemia Panel: No results for input(s): VITAMINB12, FOLATE, FERRITIN, TIBC, IRON, RETICCTPCT in the last 72  hours.    Radiology Studies: I have reviewed all of the imaging during this hospital visit personally     Scheduled Meds:  apixaban  2.5 mg Oral BID   carvedilol  3.125 mg Oral BID WC   docusate sodium  100 mg Oral BID   methimazole  5 mg Oral Daily   naproxen  250 mg Oral BID WC   pantoprazole  40 mg Oral Daily   pravastatin  10 mg Oral QHS   Continuous Infusions:   LOS: 3 days        Paolina Karwowski Gerome Apley, MD

## 2021-03-18 NOTE — Plan of Care (Signed)
  Problem: Clinical Measurements: Goal: Postoperative complications will be avoided or minimized Outcome: Progressing   Problem: Pain Management: Goal: Pain level will decrease Outcome: Progressing   

## 2021-03-18 NOTE — Plan of Care (Signed)

## 2021-03-18 NOTE — Plan of Care (Signed)
  Problem: Pain Managment: Goal: General experience of comfort will improve Outcome: Progressing   Problem: Safety: Goal: Ability to remain free from injury will improve Outcome: Progressing   Problem: Skin Integrity: Goal: Risk for impaired skin integrity will decrease Outcome: Progressing   

## 2021-03-19 LAB — CBC
HCT: 29.4 % — ABNORMAL LOW (ref 36.0–46.0)
Hemoglobin: 10 g/dL — ABNORMAL LOW (ref 12.0–15.0)
MCH: 34.6 pg — ABNORMAL HIGH (ref 26.0–34.0)
MCHC: 34 g/dL (ref 30.0–36.0)
MCV: 101.7 fL — ABNORMAL HIGH (ref 80.0–100.0)
Platelets: 262 10*3/uL (ref 150–400)
RBC: 2.89 MIL/uL — ABNORMAL LOW (ref 3.87–5.11)
RDW: 12.1 % (ref 11.5–15.5)
WBC: 8.2 10*3/uL (ref 4.0–10.5)
nRBC: 0 % (ref 0.0–0.2)

## 2021-03-19 MED ORDER — VITAMIN D (ERGOCALCIFEROL) 1.25 MG (50000 UNIT) PO CAPS
50000.0000 [IU] | ORAL_CAPSULE | ORAL | Status: DC
  Administered 2021-03-19: 50000 [IU] via ORAL
  Filled 2021-03-19: qty 1

## 2021-03-19 NOTE — Progress Notes (Signed)
Occupational Therapy Treatment Patient Details Name: Valerie [name] MRN: [id number] DOB: 01-Mar-1928 Today's Date: 03/19/2021    History of present illness Valerie [name] is an 87 y.o. female who presented to [facility] in [place] after a mechanical fall where x-rays demonstrated impacted femoral neck fracture, pt transferred to [facility] 6/18. S/p POSTERIOR ARTHROPLASTY BIPOLAR HIP (HEMIARTHROPLASTY) (Right) 6/19. PMH: atrial fibs flutter on Eliquis at the time of her follow-up with chronic A. fib, hypothyroidism, depression, anxiety, HCC, Glaucoma.   OT comments  Pt making good progress with OT goals this session. Pt completed short distance functional mobility to bathroom, completed grooming at sink, and transfers with min guard- min A, using a RW. Pt reports that she is experiencing a lot less pain this session, allowing her to tolerate ambulating further. Acute OT will continue to follow up and ensure pt safety with mobility and meeting her goals.   Follow Up Recommendations  Supervision/Assistance - 24 hour;Home health OT (CIR denied, if family can provide 24/7 assist pt is safe to go home.)    Equipment Recommendations  Tub/shower bench;Other (comment)    Recommendations for Other Services      Precautions / Restrictions Precautions Precautions: Posterior Hip Precaution Booklet Issued: Yes (comment) Precaution Comments: pt able to recall 0/3 precautions, handout in room and reinforced with her throughout. Required Braces or Orthoses: Other Brace (recommended MD order her an AFO to assist with neutral hip position and offload heel while resting) Knee Immobilizer - Right: Other (comment) (KI in room but no buckling with static standing/gait so kept off; verbal orders from MD to remove KI after posterior hip precautions have been reviewed) Restrictions Weight Bearing Restrictions: Yes RLE Weight Bearing: Weight bearing as tolerated       Mobility Bed Mobility Overal bed mobility: Needs  Assistance Bed Mobility: Sit to Supine Rolling: Min assist   Supine to sit: Min assist;HOB elevated Sit to supine: Min assist   General bed mobility comments: Assist with supporting legs up into bed    Transfers Overall transfer level: Needs assistance Equipment used: Rolling walker (2 wheeled) Transfers: Sit to/from Stand Sit to Stand: Min assist         General transfer comment: cues for hand placement and minA for lift assist, stability and RW stabilization.    Balance Overall balance assessment: Mild deficits observed, not formally tested Sitting-balance support: Feet supported;Bilateral upper extremity supported Sitting balance-Leahy Scale: Poor Sitting balance - Comments: needs BUE support for seated balance   Standing balance support: Bilateral upper extremity supported Standing balance-Leahy Scale: Poor Standing balance comment: needs RW and +1 assist for stability                           ADL either performed or assessed with clinical judgement   ADL Overall ADL's : Needs assistance/impaired     Grooming: Wash/dry hands;Standing;Min guard Grooming Details (indicate cue type and reason): Completed at sink                 Toilet Transfer: Ambulation;Minimal Patent examiner Details (indicate cue type and reason): Pt able to transfer with elevated surface heights, requiring less assist         Functional mobility during ADLs: Min guard;Minimal assistance;Rolling walker General ADL Comments: Pt initially requiring min A for mobility, as she ambulated into bathroom, she required less assist to only min guard for safety. Pt moves slowly, however is able to tolerate longer periods  OOB     Vision       Perception     Praxis      Cognition Arousal/Alertness: Awake/alert Behavior During Therapy: WFL for tasks assessed/performed Overall Cognitive Status: Within Functional Limits for tasks assessed Area of Impairment:  Memory                     Memory: Decreased recall of precautions;Decreased short-term memory Following Commands: Follows one step commands consistently;Follows one step commands with increased time Safety/Judgement: Decreased awareness of safety Awareness: Emergent Problem Solving: Slow processing;Decreased initiation;Requires verbal cues General Comments: pt seems closer to her normal baseline, somewhat HoH and visual deficits due to glaucoma so unable to see hip precautions handout well, soft call bell placed in room for RN to set up after she gets back to bed (will not work with chair alarm activated).        Exercises Exercises: Total Joint;Other exercises Total Joint Exercises Ankle Circles/Pumps: AROM;Both;10 reps;Supine Quad Sets: AROM;Right;10 reps;Supine Heel Slides: AAROM;Right;10 reps;Supine Long Arc Quad: Right;AAROM;10 reps;Seated Other Exercises Other Exercises: incentive spirometer x 10 reps, cues for speed (pt cannot see when blue device is all the way down at the bottom). Able to reach 1,000 mL.   Shoulder Instructions       General Comments VSS, O2 dropped to 88% with activity, quickly returned to 93% once seated    Pertinent Vitals/ Pain       Pain Assessment: Faces Faces Pain Scale: Hurts a little bit Pain Location: R hip Pain Descriptors / Indicators: Guarding;Sore Pain Intervention(s): Monitored during session;Repositioned  Home Living                                          Prior Functioning/Environment              Frequency  Min 2X/week        Progress Toward Goals  OT Goals(current goals can now be found in the care plan section)  Progress towards OT goals: Progressing toward goals  Acute Rehab OT Goals Patient Stated Goal: to get back home OT Goal Formulation: With patient Time For Goal Achievement: 03/31/21 Potential to Achieve Goals: Good ADL Goals Pt Will Perform Lower Body Bathing: with min guard  assist;with adaptive equipment;sit to/from stand Pt Will Perform Lower Body Dressing: with min assist;with adaptive equipment;sit to/from stand Pt Will Transfer to Toilet: with min guard assist;ambulating Pt Will Perform Toileting - Clothing Manipulation and hygiene: with min assist;sit to/from stand Pt Will Perform Tub/Shower Transfer: with min assist;tub bench;rolling walker Additional ADL Goal #1: Pt will independently recall all R posterior hip precautions to allow for safe functional mobility.  Plan Discharge plan remains appropriate;Frequency remains appropriate    Co-evaluation                 AM-PAC OT "6 Clicks" Daily Activity     Outcome Measure   Help from another person eating meals?: A Little Help from another person taking care of personal grooming?: A Little Help from another person toileting, which includes using toliet, bedpan, or urinal?: A Little Help from another person bathing (including washing, rinsing, drying)?: A Lot Help from another person to put on and taking off regular upper body clothing?: A Little Help from another person to put on and taking off regular lower body clothing?: A Lot 6 Click Score: 16  End of Session Equipment Utilized During Treatment: Gait belt;Rolling walker  OT Visit Diagnosis: Unsteadiness on feet (R26.81);Muscle weakness (generalized) (M62.81);History of falling (Z91.81);Pain Pain - Right/Left: Right Pain - part of body: Hip   Activity Tolerance Patient tolerated treatment well   Patient Left in bed;with call bell/phone within reach;with bed alarm set;with family/visitor present   Nurse Communication Mobility status        Time: 3672-5500 OT Time Calculation (min): 20 min  Charges: OT General Charges $OT Visit: 1 Visit OT Treatments $Self Care/Home Management : 8-22 mins  Raley Novicki H., OTR/L Acute Rehabilitation  Melaya Hoselton Elane Morganna Styles 03/19/2021, 3:09 PM

## 2021-03-19 NOTE — Plan of Care (Signed)

## 2021-03-19 NOTE — Progress Notes (Signed)
Inpatient Rehab Admissions Coordinator:   Notified by [facility] that request for CIR has been denied.  Dr. Cathlean Sauer attempted a peer to peer with medical director, but denial was upheld.  I will not be able to admit this patient to CIR.  Will let TOC team know and f/u with family at bedside this afternoon to update.   Shann Medal, PT, DPT Admissions Coordinator [phone] 03/19/21  1:21 PM

## 2021-03-19 NOTE — Progress Notes (Signed)
Physical Therapy Treatment Patient Details Name: Valerie [name] MRN: [id number] DOB: [birth year] Today's Date: 03/19/2021    History of Present Illness Valerie [name] is an 87 y.o. female who presented to [facility] in [place] after a mechanical fall where x-rays demonstrated impacted femoral neck fracture, pt transferred to [facility] 6/18. S/p POSTERIOR ARTHROPLASTY BIPOLAR HIP (HEMIARTHROPLASTY) (Right) 6/19. PMH: atrial fibs flutter on Eliquis at the time of her follow-up with chronic A. fib, hypothyroidism, depression, anxiety, HCC, Glaucoma.    PT Comments    Pt received in supine, agreeable to therapy session and with good participation in gait and transfer training. Pt without report of dizziness this date and motivated to progress gait tolerance, mainly limited due to fatigue and deconditioning. Pt performed gait/transfers with up to minA and chair follow for safety to achieve household distances with RW and increased time. Pt having difficulty maintaining no internal rotation precaution at rest and with steps so MD notified she would benefit from AFO with kick stand to keep RLE in neutral in bed. VSS on RA with exertion and pt reports 5/10 modified RPE (fatigue) at end of session. Pt continues to benefit from PT services to progress toward functional mobility goals. Continue to recommend CIR.   Follow Up Recommendations  CIR     Equipment Recommendations  Rolling walker with 5" wheels;3in1 (PT);Hospital bed    Recommendations for Other Services       Precautions / Restrictions Precautions Precautions: Posterior Hip Precaution Booklet Issued: Yes (comment) Precaution Comments: pt able to recall 0/3 precautions, handout in room and reinforced with her throughout. Required Braces or Orthoses: Other Brace (recommended MD order her an AFO to assist with neutral hip position and offload heel while resting) Knee Immobilizer - Right: Other (comment) (KI in room but no buckling with static  standing/gait so kept off; verbal orders from MD to remove KI after posterior hip precautions have been reviewed) Restrictions Weight Bearing Restrictions: Yes RLE Weight Bearing: Weight bearing as tolerated    Mobility  Bed Mobility Overal bed mobility: Needs Assistance Bed Mobility: Supine to Sit;Rolling Rolling: Min assist   Supine to sit: Min assist;HOB elevated     General bed mobility comments: cues for sequencing and minA for asisst with RLE and heavy cues for maintenance of hip precs as pt tending to turn R knee/toes inward; needs assist to keep RLE elevated to prevent crossing legs with roll to side, RN notified to use pillow between her knees in future when rolling her for cleanup/dressing changes.    Transfers Overall transfer level: Needs assistance Equipment used: Rolling walker (2 wheeled) Transfers: Sit to/from Stand Sit to Stand: Min assist         General transfer comment: cues for hand placement and minA for lift assist, stability and RW stabilization.  Ambulation/Gait Ambulation/Gait assistance: Min assist;+2 safety/equipment Gait Distance (Feet): 50 Feet Assistive device: Rolling walker (2 wheeled) Gait Pattern/deviations: Step-to pattern;Decreased weight shift to right;Decreased stride length;Narrow base of support;Decreased step length - left;Shuffle     General Gait Details: cues needed for environmental awareness (pt with visual deficit). Min assist to support trunk mostly when WB through right leg and with posterior steps and turns.  Manual assist to help steer RW.  Pt reports no dizziness this date and SpO2 WNL on RA. Needs mod cues and tactile assist to keep RLE more externally rotated and not to toe-in when turning/stepping.   Stairs  Wheelchair Mobility    Modified Rankin (Stroke Patients Only)       Balance Overall balance assessment: Needs assistance Sitting-balance support: Feet supported;Bilateral upper extremity  supported Sitting balance-Leahy Scale: Poor Sitting balance - Comments: needs BUE support for seated balance   Standing balance support: Bilateral upper extremity supported Standing balance-Leahy Scale: Poor Standing balance comment: needs RW and +1 assist for stability                            Cognition Arousal/Alertness: Awake/alert Behavior During Therapy: WFL for tasks assessed/performed Overall Cognitive Status: Impaired/Different from baseline Area of Impairment: Memory                     Memory: Decreased recall of precautions;Decreased short-term memory Following Commands: Follows one step commands consistently;Follows one step commands with increased time Safety/Judgement: Decreased awareness of safety Awareness: Emergent Problem Solving: Slow processing;Decreased initiation;Requires verbal cues General Comments: pt seems closer to her normal baseline, somewhat HoH and visual deficits due to glaucoma so unable to see hip precautions handout well, soft call bell placed in room for RN to set up after she gets back to bed (will not work with chair alarm activated).      Exercises Total Joint Exercises Ankle Circles/Pumps: AROM;Both;10 reps;Supine Quad Sets: AROM;Right;10 reps;Supine Heel Slides: AAROM;Right;10 reps;Supine Long Arc Quad: Right;AAROM;10 reps;Seated Other Exercises Other Exercises: incentive spirometer x 10 reps, cues for speed (pt cannot see when blue device is all the way down at the bottom). Able to reach 1,000 mL.    General Comments General comments (skin integrity, edema, etc.): BP 143/47 (75) pre-mobility in supine; SpO2 94-97% on RA with exertion; HR 68-72 bpm; BP 139/56 (81) post-exertion seated. Pulling 1,000 on IS x10 reps      Pertinent Vitals/Pain Pain Assessment: Faces Faces Pain Scale: Hurts a little bit Pain Location: R hip Pain Descriptors / Indicators: Guarding;Sore Pain Intervention(s): Monitored during  session;Repositioned    Home Living                      Prior Function            PT Goals (current goals can now be found in the care plan section) Acute Rehab PT Goals Patient Stated Goal: to get back home PT Goal Formulation: With patient/family Time For Goal Achievement: 03/31/21 Progress towards PT goals: Progressing toward goals    Frequency    Min 5X/week      PT Plan Current plan remains appropriate    Co-evaluation              AM-PAC PT "6 Clicks" Mobility   Outcome Measure  Help needed turning from your back to your side while in a flat bed without using bedrails?: A Little Help needed moving from lying on your back to sitting on the side of a flat bed without using bedrails?: A Little Help needed moving to and from a bed to a chair (including a wheelchair)?: A Little Help needed standing up from a chair using your arms (e.g., wheelchair or bedside chair)?: A Little Help needed to walk in hospital room?: A Little Help needed climbing 3-5 steps with a railing? : A Lot 6 Click Score: 17    End of Session Equipment Utilized During Treatment: Gait belt Activity Tolerance: Patient tolerated treatment well Patient left: with call bell/phone within reach;in chair;with chair alarm set Nurse Communication: Mobility  status;Other (comment);Precautions (RN notified soft call bell in room, it won't work with chair alarm on but can be placed when pt back in bed. pt has trouble seeing call bell to press button; pt reports she never got her breakfast; multiple small white pills found in bed when she sat up.) PT Visit Diagnosis: Muscle weakness (generalized) (M62.81);Difficulty in walking, not elsewhere classified (R26.2);Pain Pain - Right/Left: Right Pain - part of body: Hip     Time: 6720-9198 PT Time Calculation (min) (ACUTE ONLY): 34 min  Charges:  $Gait Training: 8-22 mins $Therapeutic Exercise: 8-22 mins                     Miguel Christiana P., PTA Acute  Rehabilitation Services Pager: [phone] Office: [place] 03/19/2021, 12:39 PM

## 2021-03-19 NOTE — Plan of Care (Signed)
  Problem: Activity: Goal: Ability to ambulate and perform ADLs will improve Outcome: Progressing   Problem: Clinical Measurements: Goal: Postoperative complications will be avoided or minimized Outcome: Progressing   Problem: Pain Management: Goal: Pain level will decrease Outcome: Progressing   

## 2021-03-19 NOTE — Progress Notes (Signed)
PROGRESS NOTE    Valerie [name]  LKJ:[id number] DOB: 02/22/1928 DOA: 03/15/2021 PCP: Chevis Pretty, FNP    Brief Narrative:  Valerie [name] was admitted to the hospital with the working diagnosis of right femoral neck fracture.   87 year old female with past medical history for chronic atrial fibrillation/flutter, hypertension, dyslipidemia, glaucoma and hyperthyroid who presented after a mechanical fall.  She usually ambulates without help, no need of a walker or cane.  She does have glaucoma limiting her mobility.  2 days prior to hospitalization she stepped on a puddle of water in the bathroom and fell on her right hip producing severe pain and amatory dysfunction.  Due to persistent symptoms EMS was called and patient was taken to [facility], she was diagnosed with right femoral neck fracture and was transferred to [facility] for further orthopedic evaluation. At the time of her transfer her blood pressure was 134/54, heart rate 75, temperature 98.3, oxygen saturation 99%.  Her lungs are clear to auscultation, heart S1-S2, present, rhythmic, 3-6 systolic murmur at the apex, abdomen soft nontender, no lower extremity edema, right leg with shortened and internally rotated.   White blood cell count 8.7, hemoglobin 13.1, hematocrit 38.5, platelets 227. 6/16 SARS COVID-19 negative. Sodium 136, potassium 4.0, chloride 103, bicarb 26, BUN 29, creatinine 1.17. Urinalysis specific gravity 1.015, negative nitrates.   Right pelvic x-rays with impacted right femoral neck fracture. EKG 80 bpm, sinus rhythm.   Underwent right hemiarthroplasty on 03/16/21   Insurance decline inpatient rehab. Plan to transfer to SNF    Assessment & Plan:   Principal Problem:   Femoral neck fracture (HCC) Active Problems:   Iron deficiency anemia   Glaucoma   Hyperlipidemia   Atrial fibrillation (HCC)   Aortic insufficiency   Essential hypertension   Malnutrition of moderate degree    Right femoral  neck fracture. reports her pain being well controlled, continue to be very weak and deconditioned, poor appetite.   Continue to encourage mobility, out of bed to chair tid with meals, PT and OT. Declined inpatient rahab per insurance, plan to transfer to SNF.    2.   Paroxysmal atrial fibrillation. EKG at [facility] reported to be sinus rhythm. Continue rate control with carvedilol and anticoagulation with apixaban,     3. HTN/ dyslipidemia. Continue with carvedilol with good toleration. On pravastatin.   4. Hyperthyroid. Continue with Methimazole.   5. Iron deficiency anemia/ moderate calorie protein malnutrition stable cell count.  Continue with nutritional supplements    Status is: Inpatient  Remains inpatient appropriate because:Inpatient level of care appropriate due to severity of illness  Dispo: The patient is from: Home              Anticipated d/c is to: SNF              Patient currently is medically stable to d/c.   Difficult to place patient No   DVT prophylaxis: Apixaban   Code Status:   Full  Family Communication:  No family at the bedside      Nutrition Status: Nutrition Problem: Moderate Malnutrition Etiology: chronic illness (a-fib, hyperthroidism) Signs/Symptoms: mild muscle depletion, moderate muscle depletion Interventions: Ensure Enlive (each supplement provides 350kcal and 20 grams of protein), Magic cup, MVI      Consultants:  Orthopedics   Procedures:  Right hemiarthroplasty    Subjective: Patient reports pain to be well controlled with analgesics, no nausea or vomiting, no chest pain or dyspnea, continue to be very weak  and deconditioned.   Objective: Vitals:   03/18/21 1500 03/18/21 2216 03/19/21 0754 03/19/21 1345  BP: 115/65 (!) 131/52 (!) 143/46 (!) 112/43  Pulse: 66 67 63 66  Resp: 18 17 16 17   Temp: 97.8 F (36.6 C) 99 F (37.2 C) 98.1 F (36.7 C) 98.4 F (36.9 C)  TempSrc: Oral Oral Oral   SpO2: 98% 98% 96% 100%     Intake/Output Summary (Last 24 hours) at 03/19/2021 1624 Last data filed at 03/19/2021 1237 Gross per 24 hour  Intake 120 ml  Output 1 ml  Net 119 ml   There were no vitals filed for this visit.  Examination:   General: Not in pain or dyspnea  Neurology: Awake and alert, non focal  E ENT: no pallor, no icterus, oral mucosa moist Cardiovascular: No JVD. S1-S2 present, rhythmic, no gallops, rubs, or murmurs. No lower extremity edema. Pulmonary: positive breath sounds bilaterally, adequate air movement, no wheezing, rhonchi or rales. Gastrointestinal. Abdomen soft and non tender Skin. No rashes Musculoskeletal: no joint deformities     Data Reviewed: I have personally reviewed following labs and imaging studies  CBC: Recent Labs  Lab 03/16/21 0140 03/17/21 0322 03/18/21 0427 03/19/21 0334  WBC 8.7 7.2 8.1 8.2  NEUTROABS 7.1 6.4  --   --   HGB 13.1 10.9* 10.2* 10.0*  HCT 38.5 32.0* 29.3* 29.4*  MCV 101.6* 100.9* 100.3* 101.7*  PLT 227 212 222 696   Basic Metabolic Panel: Recent Labs  Lab 03/17/21 0322 03/18/21 0427  NA 133* 138  K 4.2 4.1  CL 100 103  CO2 23 22  GLUCOSE 135* 88  BUN 29* 37*  CREATININE 1.00 0.91  CALCIUM 8.6* 8.7*   GFR: CrCl cannot be calculated (Unknown ideal weight.). Liver Function Tests: No results for input(s): AST, ALT, ALKPHOS, BILITOT, PROT, ALBUMIN in the last 168 hours. No results for input(s): LIPASE, AMYLASE in the last 168 hours. No results for input(s): AMMONIA in the last 168 hours. Coagulation Profile: No results for input(s): INR, PROTIME in the last 168 hours. Cardiac Enzymes: No results for input(s): CKTOTAL, CKMB, CKMBINDEX, TROPONINI in the last 168 hours. BNP (last 3 results) No results for input(s): PROBNP in the last 8760 hours. HbA1C: No results for input(s): HGBA1C in the last 72 hours. CBG: No results for input(s): GLUCAP in the last 168 hours. Lipid Profile: No results for input(s): CHOL, HDL, LDLCALC,  TRIG, CHOLHDL, LDLDIRECT in the last 72 hours. Thyroid Function Tests: No results for input(s): TSH, T4TOTAL, FREET4, T3FREE, THYROIDAB in the last 72 hours. Anemia Panel: No results for input(s): VITAMINB12, FOLATE, FERRITIN, TIBC, IRON, RETICCTPCT in the last 72 hours.    Radiology Studies: I have reviewed all of the imaging during this hospital visit personally     Scheduled Meds:  apixaban  2.5 mg Oral BID   carvedilol  3.125 mg Oral BID WC   docusate sodium  100 mg Oral BID   fluticasone  2 spray Each Nare Daily   methimazole  5 mg Oral Daily   naproxen  250 mg Oral BID WC   pantoprazole  40 mg Oral Daily   pravastatin  10 mg Oral QHS   Vitamin D (Ergocalciferol)  50,000 Units Oral Q Wed   Continuous Infusions:   LOS: 4 days        Ninel Abdella Gerome Apley, MD

## 2021-03-19 NOTE — Care Management Important Message (Signed)
Important Message  Patient Details  Name: Valerie [name] MRN: [id number] Date of Birth: January 14, 1928   Medicare Important Message Given:  Yes     Jnaya Butrick 03/19/2021, 3:52 PM

## 2021-03-19 NOTE — Progress Notes (Signed)
Orthopedic Tech Progress Note Patient Details:  Valerie [name] 06-07-1928 [id number]  Called in order to [employer] for an AFO  Patient ID: Valerie [name], female   DOB: 07-17-1928, 87 y.o.   MRN: [id number]  Valerie [name] 03/19/2021, 1:17 PM

## 2021-03-20 MED ORDER — POLYETHYLENE GLYCOL 3350 17 G PO PACK: 17.0000 g | PACK | Freq: Every day | ORAL | 0 refills | Status: DC | PRN

## 2021-03-20 MED ORDER — HYDROCODONE-ACETAMINOPHEN 5-325 MG PO TABS: 1.0000 | ORAL_TABLET | Freq: Four times a day (QID) | ORAL | 0 refills | Status: DC | PRN

## 2021-03-20 MED ORDER — AMLODIPINE BESYLATE 10 MG PO TABS
10.0000 mg | ORAL_TABLET | Freq: Every day | ORAL | Status: DC
  Administered 2021-03-20: 10 mg via ORAL
  Filled 2021-03-20: qty 1

## 2021-03-20 MED ORDER — DOCUSATE SODIUM 100 MG PO CAPS: 100.0000 mg | ORAL_CAPSULE | Freq: Two times a day (BID) | ORAL | 0 refills | Status: DC

## 2021-03-20 NOTE — Discharge Instructions (Signed)

## 2021-03-20 NOTE — Plan of Care (Signed)

## 2021-03-20 NOTE — Discharge Summary (Signed)
Physician Discharge Summary  Valerie [name] YBO:[id number] DOB: January 21, 1928 DOA: 03/15/2021  PCP: Chevis Pretty, FNP  Admit date: 03/15/2021 Discharge date: 03/20/2021  Admitted From: Home  Disposition:  Home   Recommendations for Outpatient Follow-up and new medication changes:  Follow up with  Chevis Pretty FNP in 7 to 10 days.  Continue pain control with hydrocodone.   Home Health: yes   Equipment/Devices: walker and boot    Discharge Condition: stable  CODE STATUS: full  Diet recommendation:  heart healthy   Brief/Interim Summary: Valerie [name] was admitted to the hospital with the working diagnosis of right femoral neck fracture.   87 year old female with past medical history for chronic atrial fibrillation/flutter, hypertension, dyslipidemia, glaucoma and hyperthyroid who presented after a mechanical fall.  She usually ambulates without help, no need of a walker or cane.  She does have glaucoma limiting her mobility.  2 days prior to hospitalization she stepped on a puddle of water in the bathroom and fell on her right hip producing severe pain and ambulatory dysfunction.  Due to persistent symptoms EMS was called and patient was taken to [facility], she was diagnosed with right femoral neck fracture and was transferred to [facility] for further orthopedic evaluation. At the time of her transfer her blood pressure was 134/54, heart rate 75, temperature 98.3, oxygen saturation 99%.  Her lungs were clear to auscultation, heart S1-S2, present, rhythmic, 3-6 systolic murmur at the apex, abdomen soft nontender, no lower extremity edema, right leg with shortened and internally rotated.   White blood cell count 8.7, hemoglobin 13.1, hematocrit 38.5, platelets 227. 6/16 SARS COVID-19 negative. Sodium 136, potassium 4.0, chloride 103, bicarb 26, BUN 29, creatinine 1.17. Urinalysis specific gravity 1.015, negative nitrates.   Right pelvic x-rays with impacted right femoral neck  fracture. EKG 80 bpm, sinus rhythm.   Underwent right hemiarthroplasty on 03/16/21    Insurance decline inpatient rehab, patient will be discharged home with home health services.   Acute right femoral neck fracture.  Patient was admitted to the medical ward, she received analgesics and DVT prophylaxis. Orthopedics were consulted and she underwent right hemiarthroplasty with no major complications.  She has been working with physical therapy and Occupational Therapy.  Initial recommendation for inpatient rehab.  Unfortunately insurance declined this services. Patient will be discharged home with home health services.   2.  Paroxysmal atrial fibrillation.  Patient remains sinus rhythm, rate control with carvedilol and anticoagulation with apixaban.  3.  Hypertension/dyslipidemia.  Continue blood pressure control with carvedilol, at discharge resume amlodipine and losartan.  patient on pravastatin.  4.  Hypothyroid.  Continue methimazole.  5.  Iron deficiency anemia, moderate calorie protein malnutrition. Continue oral iron supplements, cell count remained stable.  Patient will continue with nutritional supplements. Discharge Hgb is 10,0 with hct at 29,4.     Discharge Diagnoses:  Principal Problem:   Femoral neck fracture (HCC) Active Problems:   Iron deficiency anemia   Glaucoma   Hyperlipidemia   Atrial fibrillation (HCC)   Aortic insufficiency   Essential hypertension   Malnutrition of moderate degree    Discharge Instructions   Allergies as of 03/20/2021       Reactions   Aricept [donepezil Hcl] Other (See Comments)   Nightmares, near syncope, weak, decreased appetite.   Lisinopril Other (See Comments)   Hair loss   Sulfa Antibiotics Nausea Only   Sulfasalazine Nausea Only        Medication List  TAKE these medications    acetaminophen 325 MG tablet Commonly known as: TYLENOL Take 650 mg by mouth every 6 (six) hours as needed for headache (pain).    amLODipine 10 MG tablet Commonly known as: NORVASC Take 1 tablet (10 mg total) by mouth daily.   apixaban 2.5 MG Tabs tablet Commonly known as: Eliquis TAKE  (1)  TABLET TWICE A DAY. What changed:  how much to take how to take this when to take this additional instructions   carvedilol 3.125 MG tablet Commonly known as: COREG TAKE  (1)  TABLET TWICE A DAY WITH MEALS (BREAKFAST AND SUPPER) What changed:  how much to take how to take this when to take this additional instructions   docusate sodium 100 MG capsule Commonly known as: COLACE Take 1 capsule (100 mg total) by mouth 2 (two) times daily.   fluticasone 50 MCG/ACT nasal spray Commonly known as: FLONASE Place 2 sprays into both nostrils as needed for allergies or rhinitis.   HYDROcodone-acetaminophen 5-325 MG tablet Commonly known as: Norco Take 1-2 tablets by mouth every 6 (six) hours as needed for moderate pain.   losartan 100 MG tablet Commonly known as: COZAAR Take 1 tablet (100 mg total) by mouth daily.   methimazole 5 MG tablet Commonly known as: TAPAZOLE TAKE 1 TABLET DAILY   nitroGLYCERIN 0.4 MG SL tablet Commonly known as: NITROSTAT PLACE 1 TABLET UNDER THE TONGUE AT ONSET OF CHEST PAIN EVERY 5 MINTUES UP TO 3 TIMES AS NEEDED What changed: See the new instructions.   pantoprazole 40 MG tablet Commonly known as: PROTONIX Take 1 tablet (40 mg total) by mouth daily.   polyethylene glycol 17 g packet Commonly known as: MIRALAX / GLYCOLAX Take 17 g by mouth daily as needed for mild constipation.   polyvinyl alcohol 1.4 % ophthalmic solution Commonly known as: LIQUIFILM TEARS Place 1 drop into both eyes as needed for dry eyes.   pravastatin 10 MG tablet Commonly known as: PRAVACHOL Take 1 tablet (10 mg total) by mouth daily.   Vitamin D (Ergocalciferol) 1.25 MG (50000 UNIT) Caps capsule Commonly known as: DRISDOL Take 1 capsule (50,000 Units total) by mouth every 7 (seven) days.                Durable Medical Equipment  (From admission, onward)           Start     Ordered   03/20/21 1100  For home use only DME Walker rolling  Once       Question Answer Comment  Walker: With 5 Inch Wheels   Patient needs a walker to treat with the following condition Weakness      03/20/21 1100   03/20/21 1100  For home use only DME Bedside commode  Once       Question:  Patient needs a bedside commode to treat with the following condition  Answer:  Weakness   03/20/21 1100            Follow-up Information     [name], MD Follow up in 1 week(s).   Specialty: Orthopedic Surgery Contact information: [place] [address] [phone]         Care, [facility] Follow up.   Specialty: Home Health Services Why: Home health services will be provide by [facility]. Start of care within 48 hours post discharge Contact information: [place] [address] [phone]  Hassell Done, Mary-Margaret, FNP Follow up in 1 week(s).   Specialty: Family Medicine Contact information: [address] [place] [address] [phone]                Allergies  Allergen Reactions   Aricept [Donepezil Hcl] Other (See Comments)    Nightmares, near syncope, weak, decreased appetite.   Lisinopril Other (See Comments)    Hair loss   Sulfa Antibiotics Nausea Only   Sulfasalazine Nausea Only    Consultations: Orthopedics    Procedures/Studies: Pelvis Portable  Result Date: 03/16/2021 CLINICAL DATA:  Status post right hip replacement EXAM: PORTABLE PELVIS 1-2 VIEWS COMPARISON:  03/13/2021 FINDINGS: Right hip hemiarthroplasty is now noted seated in the acetabulum. No acute bony abnormality is seen. No soft tissue changes are noted. IMPRESSION: Status post right hip replacement. Electronically Signed   By: Inez Catalina M.D.   On: 03/16/2021 16:29     Procedures: right hip hemiarthroplasty    Subjective: Patient is feeling better, no nausea or vomiting, no chest pain or dyspnea.   Discharge Exam: Vitals:   03/20/21 0810 03/20/21 1402  BP: (!) 183/67 (!) 158/68  Pulse: 74 72  Resp: 18 17  Temp: 97.9 F (36.6 C) 98.4 F (36.9 C)  SpO2:  97%   Vitals:   03/19/21 1345 03/20/21 0031 03/20/21 0810 03/20/21 1402  BP: (!) 112/43 (!) 172/58 (!) 183/67 (!) 158/68  Pulse: 66 66 74 72  Resp: 17 16 18 17   Temp: 98.4 F (36.9 C) 98.5 F (36.9 C) 97.9 F (36.6 C) 98.4 F (36.9 C)  TempSrc:  Oral Oral Oral  SpO2: 100% 96%  97%    General: Not in pain or dyspnea.  Neurology: Awake and alert, non focal  E ENT: no pallor, no icterus, oral mucosa moist Cardiovascular: No JVD. S1-S2 present, rhythmic, no gallops, rubs, or murmurs. No lower extremity edema. Pulmonary: positive  breath sounds bilaterally, adequate air movement, no wheezing, rhonchi or rales. Gastrointestinal. Abdomen soft and non tender Skin. No rashes Musculoskeletal: no joint deformities   The results of significant diagnostics from this hospitalization (including imaging, microbiology, ancillary and laboratory) are listed below for reference.     Microbiology: Recent Results (from the past 240 hour(s))  SARS CORONAVIRUS 2 (TAT 6-24 HRS) Nasopharyngeal Nasopharyngeal Swab     Status: None   Collection Time: 03/16/21  4:49 AM   Specimen: Nasopharyngeal Swab  Result Value Ref Range Status   SARS Coronavirus 2 NEGATIVE NEGATIVE Final    Comment: (NOTE) SARS-CoV-2 target nucleic acids are NOT DETECTED.  The SARS-CoV-2 RNA is generally detectable in upper and lower respiratory specimens during the acute phase of infection. Negative results do not preclude SARS-CoV-2 infection, do not rule out co-infections with other pathogens, and should not be used as the sole basis for treatment or other patient management decisions. Negative results must be combined with clinical observations, patient history, and  epidemiological information. The expected result is Negative.  Fact Sheet for Patients: SugarRoll.be  Fact Sheet for Healthcare Providers: https://www.woods-mathews.com/  This test is not yet approved or cleared by the [place] FDA and  has been authorized for detection and/or diagnosis of SARS-CoV-2 by FDA under an Emergency Use Authorization (EUA). This EUA will remain  in effect (meaning this test can be used) for the duration of the COVID-19 declaration under Se ction 564(b)(1) of the Act, 21 U.S.C. section 360bbb-3(b)(1), unless the authorization is terminated or revoked sooner.  Performed at [facility]  Lab, [address] [place], [place] [address]   Surgical pcr screen     Status: None   Collection Time: 03/16/21  5:05 AM   Specimen: Nasal Mucosa; Nasal Swab  Result Value Ref Range Status   MRSA, PCR NEGATIVE NEGATIVE Final   Staphylococcus aureus NEGATIVE NEGATIVE Final    Comment: (NOTE) The Xpert SA Assay (FDA approved for NASAL specimens in patients 73 years of age and older), is one component of a comprehensive surveillance program. It is not intended to diagnose infection nor to guide or monitor treatment. Performed at [facility], [place] [address], [place], [place] [address]      Labs: BNP (last 3 results) No results for input(s): BNP in the last 8760 hours. Basic Metabolic Panel: Recent Labs  Lab 03/17/21 0322 03/18/21 0427  NA 133* 138  K 4.2 4.1  CL 100 103  CO2 23 22  GLUCOSE 135* 88  BUN 29* 37*  CREATININE 1.00 0.91  CALCIUM 8.6* 8.7*   Liver Function Tests: No results for input(s): AST, ALT, ALKPHOS, BILITOT, PROT, ALBUMIN in the last 168 hours. No results for input(s): LIPASE, AMYLASE in the last 168 hours. No results for input(s): AMMONIA in the last 168 hours. CBC: Recent Labs  Lab 03/16/21 0140 03/17/21 0322 03/18/21 0427 03/19/21 0334  WBC 8.7 7.2 8.1 8.2  NEUTROABS 7.1  6.4  --   --   HGB 13.1 10.9* 10.2* 10.0*  HCT 38.5 32.0* 29.3* 29.4*  MCV 101.6* 100.9* 100.3* 101.7*  PLT 227 212 222 262   Cardiac Enzymes: No results for input(s): CKTOTAL, CKMB, CKMBINDEX, TROPONINI in the last 168 hours. BNP: Invalid input(s): POCBNP CBG: No results for input(s): GLUCAP in the last 168 hours. D-Dimer No results for input(s): DDIMER in the last 72 hours. Hgb A1c No results for input(s): HGBA1C in the last 72 hours. Lipid Profile No results for input(s): CHOL, HDL, LDLCALC, TRIG, CHOLHDL, LDLDIRECT in the last 72 hours. Thyroid function studies No results for input(s): TSH, T4TOTAL, T3FREE, THYROIDAB in the last 72 hours.  Invalid input(s): FREET3 Anemia work up No results for input(s): VITAMINB12, FOLATE, FERRITIN, TIBC, IRON, RETICCTPCT in the last 72 hours. Urinalysis    Component Value Date/Time   COLORURINE YELLOW 01/13/2019 2120   APPEARANCEUR Clear 12/22/2019 0952   LABSPEC 1.014 01/13/2019 2120   PHURINE 6.0 01/13/2019 2120   GLUCOSEU Negative 12/22/2019 0952   HGBUR NEGATIVE 01/13/2019 2120   BILIRUBINUR Negative 12/22/2019 0952   KETONESUR NEGATIVE 01/13/2019 2120   PROTEINUR 1+ (A) 12/22/2019 0952   PROTEINUR NEGATIVE 01/13/2019 2120   UROBILINOGEN negative 11/08/2013 0908   UROBILINOGEN 0.2 11/30/2008 1105   NITRITE Negative 12/22/2019 0952   NITRITE NEGATIVE 01/13/2019 2120   LEUKOCYTESUR 1+ (A) 12/22/2019 0952   LEUKOCYTESUR NEGATIVE 01/13/2019 2120   Sepsis Labs Invalid input(s): PROCALCITONIN,  WBC,  LACTICIDVEN Microbiology Recent Results (from the past 240 hour(s))  SARS CORONAVIRUS 2 (TAT 6-24 HRS) Nasopharyngeal Nasopharyngeal Swab     Status: None   Collection Time: 03/16/21  4:49 AM   Specimen: Nasopharyngeal Swab  Result Value Ref Range Status   SARS Coronavirus 2 NEGATIVE NEGATIVE Final    Comment: (NOTE) SARS-CoV-2 target nucleic acids are NOT DETECTED.  The SARS-CoV-2 RNA is generally detectable in upper and  lower respiratory specimens during the acute phase of infection. Negative results do not preclude SARS-CoV-2 infection, do not rule out co-infections with other pathogens, and should not be used as the sole  basis for treatment or other patient management decisions. Negative results must be combined with clinical observations, patient history, and epidemiological information. The expected result is Negative.  Fact Sheet for Patients: SugarRoll.be  Fact Sheet for Healthcare Providers: https://www.woods-mathews.com/  This test is not yet approved or cleared by the [place] FDA and  has been authorized for detection and/or diagnosis of SARS-CoV-2 by FDA under an Emergency Use Authorization (EUA). This EUA will remain  in effect (meaning this test can be used) for the duration of the COVID-19 declaration under Se ction 564(b)(1) of the Act, 21 U.S.C. section 360bbb-3(b)(1), unless the authorization is terminated or revoked sooner.  Performed at [facility], [place] [address] [place], [place] [address]   Surgical pcr screen     Status: None   Collection Time: 03/16/21  5:05 AM   Specimen: Nasal Mucosa; Nasal Swab  Result Value Ref Range Status   MRSA, PCR NEGATIVE NEGATIVE Final   Staphylococcus aureus NEGATIVE NEGATIVE Final    Comment: (NOTE) The Xpert SA Assay (FDA approved for NASAL specimens in patients 75 years of age and older), is one component of a comprehensive surveillance program. It is not intended to diagnose infection nor to guide or monitor treatment. Performed at [facility], [place] [address] [place], [place] [address]      Time coordinating discharge: 45 minutes  SIGNED:   Tawni Millers, MD  [facility] 03/20/2021, 2:36 PM

## 2021-03-20 NOTE — TOC Transition Note (Addendum)
Transition of Care [facility]) - CM/SW Discharge Note   Patient Details  Name: Valerie [name] MRN: [id number] Date of Birth: 07-Oct-1927  Transition of Care [facility]) CM/SW Contact:  Sharin Mons, RN Phone Number: 03/20/2021, 2:42 PM   Clinical Narrative:    Patient will DC to: home Anticipated DC date: 03/20/2021 Family notified: yes Transport by: car          - s/p R hip arthroplasty, 6/19  Pt from home with nephew. Supportive family. PTA independent with ADL's, no DME usage.Family to provide supervision and assist needed once d/c. NCM spoke with pt ,and daughter Valerie [name] regarding d/c planning. Pt and daughter Valerie [name] decline SNF placement , however, agreeable to home health services. Choice provided. Sherrie requested [facility] unable to provide services. Referral made with [facility] and accepted, SOC within 48 hours post d/c.  Per MD patient ready for DC today. RN, patient, patient's family, and [facility] aware of DC.    Pt without Rx med concerns or affordability issues.  Post hospital f/u noted on AVS.  Sister and nephew to provide transportation to home.  [name] (Daughter)      [phone]        RNCM will sign off for now as intervention is no longer needed. Please consult [place] again if new needs arise.      Barriers to Discharge: Continued Medical Work up   Patient Goals and CMS Choice Patient states their goals for this hospitalization and ongoing recovery are:: to get better and go home   Choice offered to / list presented to : Patient, Adult Children  Discharge Placement                       Discharge Plan and Services   Discharge Planning Services: CM Consult            DME Arranged: 3-N-1, Walker rolling   Date DME Agency Contacted: 03/20/21 Time DME Agency Contacted: [phone] Representative spoke with at DME Agency: Freda Munro HH Arranged: PT, OT, Nurse's Aide HH Agency: [place] Date [place]: 03/20/21 Time [place]: [place] Representative spoke with at [place]: [place] ([place]) Interventions     Readmission Risk Interventions No flowsheet data found.

## 2021-03-20 NOTE — Progress Notes (Signed)
Physical Therapy Treatment Patient Details Name: Valerie [name] MRN: [id number] DOB: 03-Apr-1928 Today's Date: 03/20/2021    History of Present Illness Valerie [name] is an 87 y.o. female who presented to [facility] in [place] after a mechanical fall where x-rays demonstrated impacted femoral neck fracture, pt transferred to [facility] 6/18. S/p POSTERIOR ARTHROPLASTY BIPOLAR HIP (HEMIARTHROPLASTY) (Right) 6/19. PMH: atrial fibs flutter on Eliquis at the time of her follow-up with chronic A. fib, hypothyroidism, depression, anxiety, HCC, Glaucoma.    PT Comments    Pt was seen for mobility on RW with help to maneuver due to vision and her limits of her prosthetic hip.  Recalls 2/3 precautions, and will need to be assisted to make progression of gait within her room and around obstacles.  Talked with the family about her limitations and how to assist her as pt may leave with them today.  PT is still recommending rehab even though SNF is her last option, CIR denied.  Follow for PT goals with strengthening and control of L hip movement, and work toward her more independent function, which may not be fully independent.  Has family involved with her care for now.   Follow Up Recommendations  SNF     Equipment Recommendations  Rolling walker with 5" wheels;3in1 (PT)    Recommendations for Other Services       Precautions / Restrictions Precautions Precautions: Posterior Hip Precaution Booklet Issued: Yes (comment) Precaution Comments: remembers 2/3 Required Braces or Orthoses: Other Brace (AFO) Knee Immobilizer - Right: Other (comment) (KI after review of precautions) Restrictions Weight Bearing Restrictions: Yes RLE Weight Bearing: Weight bearing as tolerated    Mobility  Bed Mobility Overal bed mobility: Needs Assistance Bed Mobility: Supine to Sit Rolling: Min guard;Min assist   Supine to sit: Min assist     General bed mobility comments: supported her effort to side of bed     Transfers Overall transfer level: Needs assistance Equipment used: Rolling walker (2 wheeled) Transfers: Sit to/from Stand Sit to Stand: Min assist         General transfer comment: prompted hand placement and control of sequence of transfers  Ambulation/Gait Ambulation/Gait assistance: Min assist Gait Distance (Feet): 100 Feet (50 x 2) Assistive device: Rolling walker (2 wheeled) Gait Pattern/deviations: Step-to pattern;Decreased weight shift to right;Decreased stride length;Decreased step length - left;Shuffle Gait velocity: reduced Gait velocity interpretation: <1.8 ft/sec, indicate of risk for recurrent falls General Gait Details: requires direct assist to maneuver walker in confined spaces, and cued for obstacle clearance.  Cannot avoid any obstacles without help   Stairs             Wheelchair Mobility    Modified Rankin (Stroke Patients Only)       Balance Overall balance assessment: Needs assistance Sitting-balance support: Feet supported Sitting balance-Leahy Scale: Fair     Standing balance support: Bilateral upper extremity supported;During functional activity Standing balance-Leahy Scale: Poor Standing balance comment: must have help to power up and balance                            Cognition Arousal/Alertness: Awake/alert Behavior During Therapy: WFL for tasks assessed/performed Overall Cognitive Status: Within Functional Limits for tasks assessed                       Memory: Decreased recall of precautions Following Commands: Follows one step commands with increased time Safety/Judgement:  (has  vision loss and requires cued direction) Awareness: Emergent Problem Solving: Slow processing General Comments: pt is in attendance with her family who are planning to take her home, discussed precautions and showed them the handout since pt is visually impaired      Exercises      General Comments        Pertinent  Vitals/Pain Pain Assessment: Faces Faces Pain Scale: Hurts little more Pain Location: R hip Pain Descriptors / Indicators: Guarding;Operative site guarding Pain Intervention(s): Limited activity within patient's tolerance;Monitored during session;Repositioned;Premedicated before session    Home Living                      Prior Function            PT Goals (current goals can now be found in the care plan section) Acute Rehab PT Goals Patient Stated Goal: to get back home PT Goal Formulation: With patient/family Progress towards PT goals: Progressing toward goals    Frequency    Min 3X/week      PT Plan Discharge plan needs to be updated;Frequency needs to be updated    Co-evaluation              AM-PAC PT "6 Clicks" Mobility   Outcome Measure  Help needed turning from your back to your side while in a flat bed without using bedrails?: A Little Help needed moving from lying on your back to sitting on the side of a flat bed without using bedrails?: A Little Help needed moving to and from a bed to a chair (including a wheelchair)?: A Little Help needed standing up from a chair using your arms (e.g., wheelchair or bedside chair)?: A Little Help needed to walk in hospital room?: A Little Help needed climbing 3-5 steps with a railing? : A Lot 6 Click Score: 17    End of Session Equipment Utilized During Treatment: Gait belt Activity Tolerance: Patient limited by fatigue;Patient limited by pain Patient left: in chair;with call bell/phone within reach;with chair alarm set;with family/visitor present Nurse Communication: Mobility status;Other (comment) (discussion with nursing about her family not being sure what dc plan is and whether pat is leaving for home today) PT Visit Diagnosis: Muscle weakness (generalized) (M62.81);Difficulty in walking, not elsewhere classified (R26.2);Pain Pain - Right/Left: Right Pain - part of body: Hip     Time: 1245-8099 PT  Time Calculation (min) (ACUTE ONLY): 43 min  Charges:  $Gait Training: 8-22 mins $Therapeutic Activity: 23-37 mins              MEILYN HEINDL 03/20/2021, 2:59 PM  Mee Hives, PT MS Acute Rehab Dept. Number: [place] and [place]

## 2021-03-20 NOTE — Progress Notes (Signed)
Patient discharging home with family and home health, reviewed all discharge instructions with patient and family, patient has all of her belongings, to be transported by family car.

## 2021-03-21 ENCOUNTER — Telehealth: Payer: Self-pay

## 2021-03-21 ENCOUNTER — Other Ambulatory Visit: Payer: Self-pay | Admitting: Surgery

## 2021-03-21 ENCOUNTER — Telehealth: Payer: Self-pay | Admitting: Orthopaedic Surgery

## 2021-03-21 MED ORDER — HYDROCODONE-ACETAMINOPHEN 5-325 MG PO TABS: 1.0000 | ORAL_TABLET | Freq: Four times a day (QID) | ORAL | 0 refills | Status: DC | PRN

## 2021-03-21 NOTE — Telephone Encounter (Signed)
PT daughter called and states pharmacy doesn't have her mothers hydrocodone prescription and I can see where it was sent. I called them and they do not have it, so is there anyway it can be resent or something?

## 2021-03-21 NOTE — Telephone Encounter (Signed)
Transition Care Management Follow-up Telephone Call Date of discharge and from where: 03/20/21 Valerie [name] Diagnosis:  right femoral neck fracture How have you been since you were released from the hospital? Overall stable, doing well Any questions or concerns? No  Items Reviewed: Did the pt receive and understand the discharge instructions provided? Yes  Medications obtained and verified? Yes  Other? No  Any new allergies since your discharge? No  Dietary orders reviewed? Yes Do you have support at home? Yes   Home Care and Equipment/Supplies: Were home health services ordered? yes If so, what is the name of the agency? [facility]  Has the agency set up a time to come to the patient's home? yes Were any new equipment or medical supplies ordered?  Yes: rolling walker, boot, and bedside commode What is the name of the medical supply agency? unknown Were you able to get the supplies/equipment? yes Do you have any questions related to the use of the equipment or supplies? No  Functional Questionnaire: (I = Independent and D = Dependent) ADLs: I  Bathing/Dressing- I  Meal Prep- D  Eating- I  Maintaining continence- I  Transferring/Ambulation- I  Managing Meds- I  Follow up appointments reviewed:  PCP Hospital f/u appt confirmed? Yes  Scheduled to see Shelah Lewandowsky on 04/01/21 @ 4. [facility] f/u appt confirmed? Yes  Scheduled to see Lorin Mercy, ortho on 04/03/21 @ 9. Are transportation arrangements needed? No  If their condition worsens, is the pt aware to call PCP or go to the Emergency Dept.? Yes Was the patient provided with contact information for the PCP's office or ED? Yes Was to pt encouraged to call back with questions or concerns? Yes

## 2021-03-21 NOTE — Telephone Encounter (Signed)
Called and advised.

## 2021-03-21 NOTE — Telephone Encounter (Signed)
Please advise 

## 2021-03-22 DIAGNOSIS — I10 Essential (primary) hypertension: Secondary | ICD-10-CM | POA: Diagnosis not present

## 2021-03-22 DIAGNOSIS — S72001D Fracture of unspecified part of neck of right femur, subsequent encounter for closed fracture with routine healing: Secondary | ICD-10-CM | POA: Diagnosis not present

## 2021-03-22 DIAGNOSIS — I351 Nonrheumatic aortic (valve) insufficiency: Secondary | ICD-10-CM | POA: Diagnosis not present

## 2021-03-22 DIAGNOSIS — I480 Paroxysmal atrial fibrillation: Secondary | ICD-10-CM | POA: Diagnosis not present

## 2021-03-22 DIAGNOSIS — E785 Hyperlipidemia, unspecified: Secondary | ICD-10-CM | POA: Diagnosis not present

## 2021-03-22 DIAGNOSIS — Z96641 Presence of right artificial hip joint: Secondary | ICD-10-CM | POA: Diagnosis not present

## 2021-03-22 DIAGNOSIS — I4892 Unspecified atrial flutter: Secondary | ICD-10-CM | POA: Diagnosis not present

## 2021-03-22 DIAGNOSIS — D509 Iron deficiency anemia, unspecified: Secondary | ICD-10-CM | POA: Diagnosis not present

## 2021-03-22 DIAGNOSIS — E440 Moderate protein-calorie malnutrition: Secondary | ICD-10-CM | POA: Diagnosis not present

## 2021-03-25 DIAGNOSIS — I4892 Unspecified atrial flutter: Secondary | ICD-10-CM | POA: Diagnosis not present

## 2021-03-25 DIAGNOSIS — D509 Iron deficiency anemia, unspecified: Secondary | ICD-10-CM | POA: Diagnosis not present

## 2021-03-25 DIAGNOSIS — I480 Paroxysmal atrial fibrillation: Secondary | ICD-10-CM | POA: Diagnosis not present

## 2021-03-25 DIAGNOSIS — E440 Moderate protein-calorie malnutrition: Secondary | ICD-10-CM | POA: Diagnosis not present

## 2021-03-25 DIAGNOSIS — I351 Nonrheumatic aortic (valve) insufficiency: Secondary | ICD-10-CM | POA: Diagnosis not present

## 2021-03-25 DIAGNOSIS — I10 Essential (primary) hypertension: Secondary | ICD-10-CM | POA: Diagnosis not present

## 2021-03-25 DIAGNOSIS — S72001D Fracture of unspecified part of neck of right femur, subsequent encounter for closed fracture with routine healing: Secondary | ICD-10-CM | POA: Diagnosis not present

## 2021-03-25 DIAGNOSIS — E785 Hyperlipidemia, unspecified: Secondary | ICD-10-CM | POA: Diagnosis not present

## 2021-03-25 DIAGNOSIS — Z96641 Presence of right artificial hip joint: Secondary | ICD-10-CM | POA: Diagnosis not present

## 2021-03-27 DIAGNOSIS — D509 Iron deficiency anemia, unspecified: Secondary | ICD-10-CM | POA: Diagnosis not present

## 2021-03-27 DIAGNOSIS — I10 Essential (primary) hypertension: Secondary | ICD-10-CM | POA: Diagnosis not present

## 2021-03-27 DIAGNOSIS — E440 Moderate protein-calorie malnutrition: Secondary | ICD-10-CM | POA: Diagnosis not present

## 2021-03-27 DIAGNOSIS — S72001D Fracture of unspecified part of neck of right femur, subsequent encounter for closed fracture with routine healing: Secondary | ICD-10-CM | POA: Diagnosis not present

## 2021-03-27 DIAGNOSIS — E785 Hyperlipidemia, unspecified: Secondary | ICD-10-CM | POA: Diagnosis not present

## 2021-03-27 DIAGNOSIS — I480 Paroxysmal atrial fibrillation: Secondary | ICD-10-CM | POA: Diagnosis not present

## 2021-03-27 DIAGNOSIS — I4892 Unspecified atrial flutter: Secondary | ICD-10-CM | POA: Diagnosis not present

## 2021-03-27 DIAGNOSIS — I351 Nonrheumatic aortic (valve) insufficiency: Secondary | ICD-10-CM | POA: Diagnosis not present

## 2021-03-27 DIAGNOSIS — Z96641 Presence of right artificial hip joint: Secondary | ICD-10-CM | POA: Diagnosis not present

## 2021-04-01 ENCOUNTER — Encounter: Payer: Self-pay | Admitting: Nurse Practitioner

## 2021-04-01 ENCOUNTER — Ambulatory Visit (INDEPENDENT_AMBULATORY_CARE_PROVIDER_SITE_OTHER): Payer: Medicare PPO | Admitting: Nurse Practitioner

## 2021-04-01 ENCOUNTER — Other Ambulatory Visit: Payer: Self-pay

## 2021-04-01 ENCOUNTER — Ambulatory Visit: Payer: Medicare PPO | Admitting: Nurse Practitioner

## 2021-04-01 DIAGNOSIS — S72001D Fracture of unspecified part of neck of right femur, subsequent encounter for closed fracture with routine healing: Secondary | ICD-10-CM | POA: Diagnosis not present

## 2021-04-01 DIAGNOSIS — E440 Moderate protein-calorie malnutrition: Secondary | ICD-10-CM | POA: Diagnosis not present

## 2021-04-01 DIAGNOSIS — I480 Paroxysmal atrial fibrillation: Secondary | ICD-10-CM | POA: Diagnosis not present

## 2021-04-01 DIAGNOSIS — R399 Unspecified symptoms and signs involving the genitourinary system: Secondary | ICD-10-CM | POA: Insufficient documentation

## 2021-04-01 DIAGNOSIS — I10 Essential (primary) hypertension: Secondary | ICD-10-CM | POA: Diagnosis not present

## 2021-04-01 DIAGNOSIS — Z96641 Presence of right artificial hip joint: Secondary | ICD-10-CM | POA: Diagnosis not present

## 2021-04-01 DIAGNOSIS — I4892 Unspecified atrial flutter: Secondary | ICD-10-CM | POA: Diagnosis not present

## 2021-04-01 DIAGNOSIS — I351 Nonrheumatic aortic (valve) insufficiency: Secondary | ICD-10-CM | POA: Diagnosis not present

## 2021-04-01 DIAGNOSIS — E785 Hyperlipidemia, unspecified: Secondary | ICD-10-CM | POA: Diagnosis not present

## 2021-04-01 DIAGNOSIS — D509 Iron deficiency anemia, unspecified: Secondary | ICD-10-CM | POA: Diagnosis not present

## 2021-04-01 LAB — URINALYSIS, ROUTINE W REFLEX MICROSCOPIC
Bilirubin, UA: NEGATIVE
Nitrite, UA: POSITIVE — AB
Specific Gravity, UA: <1.005 — ABNORMAL LOW (ref 1.005–1.030)
Urobilinogen, Ur: >8 mg/dL — ABNORMAL HIGH (ref 0.2–1.0)
pH, UA: 5 (ref 5.0–7.5)

## 2021-04-01 LAB — MICROSCOPIC EXAMINATION: WBC, UA: >30 /hpf — AB (ref 0–5)

## 2021-04-01 MED ORDER — CEPHALEXIN 500 MG PO CAPS: 500.0000 mg | ORAL_CAPSULE | Freq: Two times a day (BID) | ORAL | 0 refills | Status: DC

## 2021-04-01 NOTE — Assessment & Plan Note (Signed)
UTI symptoms are resolved in the last 3 to 4 days.  Symptoms including burning, frequency no nausea, vomiting or fever.  Completed urinalysis results pending.  Increase hydration, Tylenol or ibuprofen for pain as tolerated.  Follow-up with worsening or unresolved symptoms.

## 2021-04-01 NOTE — Addendum Note (Signed)
Addended by: Ivy Lynn on: 04/01/2021 01:02 PM   Modules accepted: Orders

## 2021-04-01 NOTE — Progress Notes (Signed)
   Virtual Visit  Note Due to COVID-19 pandemic this visit was conducted virtually. This visit type was conducted due to national recommendations for restrictions regarding the COVID-19 Pandemic (e.g. social distancing, sheltering in place) in an effort to limit this patient's exposure and mitigate transmission in our community. All issues noted in this document were discussed and addressed.  A physical exam was not performed with this format.  I connected with Valerie [name] on 04/01/21 at 8:30 am by telephone and verified that I am speaking with the correct person using two identifiers. Valerie [name] is currently located at home and some is currently with patient during visit. The provider, Ivy Lynn, NP is located in their office at time of visit.  I discussed the limitations, risks, security and privacy concerns of performing an evaluation and management service by telephone and the availability of in person appointments. I also discussed with the patient that there may be a patient responsible charge related to this service. The patient expressed understanding and agreed to proceed.   History and Present Illness:  Dysuria  This is a new problem. Episode onset: In the past 4 days. The problem occurs every urination. The problem has been gradually worsening. The quality of the pain is described as burning and aching. The pain is moderate. There has been no fever. She is Not sexually active. Associated symptoms include frequency. Pertinent negatives include no chills, discharge, flank pain or nausea. She has tried nothing for the symptoms.     Review of Systems  Constitutional:  Negative for chills.  Gastrointestinal:  Negative for nausea.  Genitourinary:  Positive for dysuria and frequency. Negative for flank pain.    Observations/Objective: Televisit patient not in distress.  Assessment and Plan: Urinalysis completed results pending, increase hydration, Tylenol/ibuprofen for pain as  tolerated.  Follow Up Instructions: Follow-up with worsening or unresolved symptoms.    I discussed the assessment and treatment plan with the patient. The patient was provided an opportunity to ask questions and all were answered. The patient agreed with the plan and demonstrated an understanding of the instructions.   The patient was advised to call back or seek an in-person evaluation if the symptoms worsen or if the condition fails to improve as anticipated.  The above assessment and management plan was discussed with the patient. The patient verbalized understanding of and has agreed to the management plan. Patient is aware to call the clinic if symptoms persist or worsen. Patient is aware when to return to the clinic for a follow-up visit. Patient educated on when it is appropriate to go to the emergency department.   Time call ended: 8:40 AM  I provided 10 minutes of  non face-to-face time during this encounter.    Ivy Lynn, NP

## 2021-04-03 ENCOUNTER — Encounter: Payer: Medicare PPO | Admitting: Orthopaedic Surgery

## 2021-04-03 DIAGNOSIS — I10 Essential (primary) hypertension: Secondary | ICD-10-CM | POA: Diagnosis not present

## 2021-04-03 DIAGNOSIS — S72001D Fracture of unspecified part of neck of right femur, subsequent encounter for closed fracture with routine healing: Secondary | ICD-10-CM | POA: Diagnosis not present

## 2021-04-03 DIAGNOSIS — Z96641 Presence of right artificial hip joint: Secondary | ICD-10-CM | POA: Diagnosis not present

## 2021-04-03 DIAGNOSIS — E785 Hyperlipidemia, unspecified: Secondary | ICD-10-CM | POA: Diagnosis not present

## 2021-04-03 DIAGNOSIS — D509 Iron deficiency anemia, unspecified: Secondary | ICD-10-CM | POA: Diagnosis not present

## 2021-04-03 DIAGNOSIS — I480 Paroxysmal atrial fibrillation: Secondary | ICD-10-CM | POA: Diagnosis not present

## 2021-04-03 DIAGNOSIS — I351 Nonrheumatic aortic (valve) insufficiency: Secondary | ICD-10-CM | POA: Diagnosis not present

## 2021-04-03 DIAGNOSIS — I4892 Unspecified atrial flutter: Secondary | ICD-10-CM | POA: Diagnosis not present

## 2021-04-03 DIAGNOSIS — E440 Moderate protein-calorie malnutrition: Secondary | ICD-10-CM | POA: Diagnosis not present

## 2021-04-04 LAB — URINE CULTURE

## 2021-04-05 ENCOUNTER — Other Ambulatory Visit: Payer: Self-pay | Admitting: Nurse Practitioner

## 2021-04-05 DIAGNOSIS — N3000 Acute cystitis without hematuria: Secondary | ICD-10-CM

## 2021-04-05 MED ORDER — CEFUROXIME AXETIL 500 MG PO TABS: 500.0000 mg | ORAL_TABLET | Freq: Two times a day (BID) | ORAL | 0 refills | Status: AC

## 2021-04-07 DIAGNOSIS — I480 Paroxysmal atrial fibrillation: Secondary | ICD-10-CM | POA: Diagnosis not present

## 2021-04-07 DIAGNOSIS — I10 Essential (primary) hypertension: Secondary | ICD-10-CM | POA: Diagnosis not present

## 2021-04-07 DIAGNOSIS — I4892 Unspecified atrial flutter: Secondary | ICD-10-CM | POA: Diagnosis not present

## 2021-04-07 DIAGNOSIS — D509 Iron deficiency anemia, unspecified: Secondary | ICD-10-CM | POA: Diagnosis not present

## 2021-04-07 DIAGNOSIS — E785 Hyperlipidemia, unspecified: Secondary | ICD-10-CM | POA: Diagnosis not present

## 2021-04-07 DIAGNOSIS — S72001D Fracture of unspecified part of neck of right femur, subsequent encounter for closed fracture with routine healing: Secondary | ICD-10-CM | POA: Diagnosis not present

## 2021-04-07 DIAGNOSIS — Z96641 Presence of right artificial hip joint: Secondary | ICD-10-CM | POA: Diagnosis not present

## 2021-04-07 DIAGNOSIS — E440 Moderate protein-calorie malnutrition: Secondary | ICD-10-CM | POA: Diagnosis not present

## 2021-04-07 DIAGNOSIS — I351 Nonrheumatic aortic (valve) insufficiency: Secondary | ICD-10-CM | POA: Diagnosis not present

## 2021-04-09 DIAGNOSIS — E785 Hyperlipidemia, unspecified: Secondary | ICD-10-CM | POA: Diagnosis not present

## 2021-04-09 DIAGNOSIS — I4892 Unspecified atrial flutter: Secondary | ICD-10-CM | POA: Diagnosis not present

## 2021-04-09 DIAGNOSIS — E440 Moderate protein-calorie malnutrition: Secondary | ICD-10-CM | POA: Diagnosis not present

## 2021-04-09 DIAGNOSIS — I480 Paroxysmal atrial fibrillation: Secondary | ICD-10-CM | POA: Diagnosis not present

## 2021-04-09 DIAGNOSIS — D509 Iron deficiency anemia, unspecified: Secondary | ICD-10-CM | POA: Diagnosis not present

## 2021-04-09 DIAGNOSIS — Z96641 Presence of right artificial hip joint: Secondary | ICD-10-CM | POA: Diagnosis not present

## 2021-04-09 DIAGNOSIS — I10 Essential (primary) hypertension: Secondary | ICD-10-CM | POA: Diagnosis not present

## 2021-04-09 DIAGNOSIS — I351 Nonrheumatic aortic (valve) insufficiency: Secondary | ICD-10-CM | POA: Diagnosis not present

## 2021-04-09 DIAGNOSIS — S72001D Fracture of unspecified part of neck of right femur, subsequent encounter for closed fracture with routine healing: Secondary | ICD-10-CM | POA: Diagnosis not present

## 2021-04-10 ENCOUNTER — Ambulatory Visit (INDEPENDENT_AMBULATORY_CARE_PROVIDER_SITE_OTHER): Payer: Medicare PPO

## 2021-04-10 ENCOUNTER — Encounter: Payer: Self-pay | Admitting: Orthopaedic Surgery

## 2021-04-10 ENCOUNTER — Other Ambulatory Visit: Payer: Self-pay

## 2021-04-10 ENCOUNTER — Ambulatory Visit (INDEPENDENT_AMBULATORY_CARE_PROVIDER_SITE_OTHER): Payer: Medicare PPO | Admitting: Orthopaedic Surgery

## 2021-04-10 VITALS — Ht 64.0 in | Wt 109.0 lb

## 2021-04-10 DIAGNOSIS — M25551 Pain in right hip: Secondary | ICD-10-CM

## 2021-04-10 NOTE — Progress Notes (Signed)
Post-Op Visit Note   Patient: Valerie [name]           Date of Birth: [birth year]           MRN: [id number] Visit Date: 04/10/2021 PCP: Chevis Pretty, FNP   Assessment & Plan:staples removed.   Chief Complaint:  Chief Complaint  Patient presents with   Right Hip - Routine Post Op    03/17/2019 right hip hemi   Visit Diagnoses:  1. Pain in right hip     Plan: xrays look good. ROV 6 wks. Continue walker  Follow-Up Instructions: Return in about 6 weeks (around 05/22/2021).   Orders:  Orders Placed This Encounter  Procedures   XR HIP UNILAT W OR W/O PELVIS 2-3 VIEWS RIGHT   No orders of the defined types were placed in this encounter.   Imaging: No results found.  PMFS History: Patient Active Problem List   Diagnosis Date Noted   UTI symptoms 04/01/2021   Malnutrition of moderate degree 03/18/2021   Essential hypertension 03/16/2021   Femoral neck fracture ([place]) 03/15/2021   Hip fracture ([place]) 03/15/2021   Wound of left leg 12/09/2020   Aortic insufficiency 12/05/2019   Fatigue 08/29/2019   Atrial fibrillation ([place]) 03/06/2019   Congestive heart failure (CHF) ([place]) 02/17/2019   Left thyroid nodule 01/24/2019   Hyperthyroidism 01/14/2019   Bradycardia 04/06/2018   PVC (premature ventricular contraction) 11/26/2016   Hyperlipidemia 11/26/2016   Dislocated IOL (intraocular lens), posterior, right 10/20/2016   Posterior dislocation of iol (intraocular lens), right eye 10/20/2016   History of palpitations 06/30/2016   Blurry vision, bilateral    Glaucoma 05/25/2016   Thyroid nodule 05/20/2015   Carotid stenosis, non-symptomatic 09/14/2012   Internal and external hemorrhoids without complication 57/84/6962   Vitamin B12 deficiency 09/15/2011   Benign hypertensive heart disease without heart failure 05/22/2011   Stricture esophagus 05/18/2011   Stricture of duodenum 05/18/2011   Iron deficiency anemia 05/15/2011   Diverticulosis of colon 02/03/2008   Past  Medical History:  Diagnosis Date   Anxiety    Arthritis    Broken ribs    Carotid artery disease ([place])    Cataract    Collar bone fracture    Right   Depression    Diverticulosis of colon with hemorrhage    Duodenitis    Esophageal reflux    Esophageal stricture    Essential hypertension    Glaucoma    Hiatal hernia    History of kidney stones     x1    Hyperlipidemia    IBS (irritable bowel syndrome)    Palpitations    UTI (urinary tract infection) March 2015    Family History  Problem Relation Age of Onset   Leukemia Mother    Colon cancer Father 33   Kidney cancer Brother    Bladder Cancer Brother    Stroke Sister    Esophageal cancer Neg Hx    Stomach cancer Neg Hx     Past Surgical History:  Procedure Laterality Date   CHOLECYSTECTOMY     COLONOSCOPY     DILATION AND CURETTAGE OF UTERUS     EYE SURGERY Bilateral    total of 5   HIP ARTHROPLASTY Right 03/16/2021   Procedure: POSTERIOR ARTHROPLASTY BIPOLAR HIP (HEMIARTHROPLASTY);  Surgeon: [name], MD;  Location: [facility];  Service: Orthopedics;  Laterality: Right;   LASER PHOTO ABLATION Right 10/20/2016   Procedure: LASER PHOTO ABLATION;  Surgeon: Hayden Pedro, MD;  Location: [facility];  Service: Ophthalmology;  Laterality: Right;  Headscope laser   LEFT ROTATOR CUFF REPAIR X2 Left    PARS PLANA VITRECTOMY  10/20/2016   WITH 25G REMOVAL/SUTURE SECONDARY INTRAOCULAR LENS, GAS FLUID EXCHANGE    PARS PLANA VITRECTOMY Right 10/20/2016   Procedure: PARS PLANA VITRECTOMY WITH 25G REMOVAL/SUTURE SECONDARY INTRAOCULAR LENS, GAS FLUID EXCHANGE;  Surgeon: Hayden Pedro, MD;  Location: [facility];  Service: Ophthalmology;  Laterality: Right;   RIGHT ROTATOR CUFF REPAIR X1 Right    UPPER GASTROINTESTINAL ENDOSCOPY     with dilation   Social History   Occupational History   Occupation: retired    Fish farm manager: RETIRED  Tobacco Use   Smoking status: Never   Smokeless tobacco: Never  Vaping Use   Vaping Use: Never used   Substance and Sexual Activity   Alcohol use: No   Drug use: No   Sexual activity: Not on file

## 2021-04-15 DIAGNOSIS — I480 Paroxysmal atrial fibrillation: Secondary | ICD-10-CM | POA: Diagnosis not present

## 2021-04-15 DIAGNOSIS — Z96641 Presence of right artificial hip joint: Secondary | ICD-10-CM | POA: Diagnosis not present

## 2021-04-15 DIAGNOSIS — D509 Iron deficiency anemia, unspecified: Secondary | ICD-10-CM | POA: Diagnosis not present

## 2021-04-15 DIAGNOSIS — E785 Hyperlipidemia, unspecified: Secondary | ICD-10-CM | POA: Diagnosis not present

## 2021-04-15 DIAGNOSIS — I4892 Unspecified atrial flutter: Secondary | ICD-10-CM | POA: Diagnosis not present

## 2021-04-15 DIAGNOSIS — I351 Nonrheumatic aortic (valve) insufficiency: Secondary | ICD-10-CM | POA: Diagnosis not present

## 2021-04-15 DIAGNOSIS — S72001D Fracture of unspecified part of neck of right femur, subsequent encounter for closed fracture with routine healing: Secondary | ICD-10-CM | POA: Diagnosis not present

## 2021-04-15 DIAGNOSIS — I10 Essential (primary) hypertension: Secondary | ICD-10-CM | POA: Diagnosis not present

## 2021-04-15 DIAGNOSIS — E440 Moderate protein-calorie malnutrition: Secondary | ICD-10-CM | POA: Diagnosis not present

## 2021-04-16 DIAGNOSIS — Z96641 Presence of right artificial hip joint: Secondary | ICD-10-CM | POA: Diagnosis not present

## 2021-04-16 DIAGNOSIS — D509 Iron deficiency anemia, unspecified: Secondary | ICD-10-CM | POA: Diagnosis not present

## 2021-04-16 DIAGNOSIS — E440 Moderate protein-calorie malnutrition: Secondary | ICD-10-CM | POA: Diagnosis not present

## 2021-04-16 DIAGNOSIS — I10 Essential (primary) hypertension: Secondary | ICD-10-CM | POA: Diagnosis not present

## 2021-04-16 DIAGNOSIS — I4892 Unspecified atrial flutter: Secondary | ICD-10-CM | POA: Diagnosis not present

## 2021-04-16 DIAGNOSIS — I351 Nonrheumatic aortic (valve) insufficiency: Secondary | ICD-10-CM | POA: Diagnosis not present

## 2021-04-16 DIAGNOSIS — I480 Paroxysmal atrial fibrillation: Secondary | ICD-10-CM | POA: Diagnosis not present

## 2021-04-16 DIAGNOSIS — S72001D Fracture of unspecified part of neck of right femur, subsequent encounter for closed fracture with routine healing: Secondary | ICD-10-CM | POA: Diagnosis not present

## 2021-04-16 DIAGNOSIS — E785 Hyperlipidemia, unspecified: Secondary | ICD-10-CM | POA: Diagnosis not present

## 2021-04-22 DIAGNOSIS — I10 Essential (primary) hypertension: Secondary | ICD-10-CM | POA: Diagnosis not present

## 2021-04-22 DIAGNOSIS — E785 Hyperlipidemia, unspecified: Secondary | ICD-10-CM | POA: Diagnosis not present

## 2021-04-22 DIAGNOSIS — Z96641 Presence of right artificial hip joint: Secondary | ICD-10-CM | POA: Diagnosis not present

## 2021-04-22 DIAGNOSIS — E440 Moderate protein-calorie malnutrition: Secondary | ICD-10-CM | POA: Diagnosis not present

## 2021-04-22 DIAGNOSIS — I480 Paroxysmal atrial fibrillation: Secondary | ICD-10-CM | POA: Diagnosis not present

## 2021-04-22 DIAGNOSIS — D509 Iron deficiency anemia, unspecified: Secondary | ICD-10-CM | POA: Diagnosis not present

## 2021-04-22 DIAGNOSIS — S72001D Fracture of unspecified part of neck of right femur, subsequent encounter for closed fracture with routine healing: Secondary | ICD-10-CM | POA: Diagnosis not present

## 2021-04-22 DIAGNOSIS — I351 Nonrheumatic aortic (valve) insufficiency: Secondary | ICD-10-CM | POA: Diagnosis not present

## 2021-04-22 DIAGNOSIS — I4892 Unspecified atrial flutter: Secondary | ICD-10-CM | POA: Diagnosis not present

## 2021-04-24 DIAGNOSIS — I351 Nonrheumatic aortic (valve) insufficiency: Secondary | ICD-10-CM | POA: Diagnosis not present

## 2021-04-24 DIAGNOSIS — Z96641 Presence of right artificial hip joint: Secondary | ICD-10-CM | POA: Diagnosis not present

## 2021-04-24 DIAGNOSIS — S72001D Fracture of unspecified part of neck of right femur, subsequent encounter for closed fracture with routine healing: Secondary | ICD-10-CM | POA: Diagnosis not present

## 2021-04-24 DIAGNOSIS — E440 Moderate protein-calorie malnutrition: Secondary | ICD-10-CM | POA: Diagnosis not present

## 2021-04-24 DIAGNOSIS — I480 Paroxysmal atrial fibrillation: Secondary | ICD-10-CM | POA: Diagnosis not present

## 2021-04-24 DIAGNOSIS — I10 Essential (primary) hypertension: Secondary | ICD-10-CM | POA: Diagnosis not present

## 2021-04-24 DIAGNOSIS — E785 Hyperlipidemia, unspecified: Secondary | ICD-10-CM | POA: Diagnosis not present

## 2021-04-24 DIAGNOSIS — I4892 Unspecified atrial flutter: Secondary | ICD-10-CM | POA: Diagnosis not present

## 2021-04-24 DIAGNOSIS — D509 Iron deficiency anemia, unspecified: Secondary | ICD-10-CM | POA: Diagnosis not present

## 2021-04-29 DIAGNOSIS — S72001D Fracture of unspecified part of neck of right femur, subsequent encounter for closed fracture with routine healing: Secondary | ICD-10-CM | POA: Diagnosis not present

## 2021-04-29 DIAGNOSIS — E440 Moderate protein-calorie malnutrition: Secondary | ICD-10-CM | POA: Diagnosis not present

## 2021-04-29 DIAGNOSIS — I351 Nonrheumatic aortic (valve) insufficiency: Secondary | ICD-10-CM | POA: Diagnosis not present

## 2021-04-29 DIAGNOSIS — D509 Iron deficiency anemia, unspecified: Secondary | ICD-10-CM | POA: Diagnosis not present

## 2021-04-29 DIAGNOSIS — E785 Hyperlipidemia, unspecified: Secondary | ICD-10-CM | POA: Diagnosis not present

## 2021-04-29 DIAGNOSIS — I480 Paroxysmal atrial fibrillation: Secondary | ICD-10-CM | POA: Diagnosis not present

## 2021-04-29 DIAGNOSIS — I4892 Unspecified atrial flutter: Secondary | ICD-10-CM | POA: Diagnosis not present

## 2021-04-29 DIAGNOSIS — I10 Essential (primary) hypertension: Secondary | ICD-10-CM | POA: Diagnosis not present

## 2021-04-29 DIAGNOSIS — Z96641 Presence of right artificial hip joint: Secondary | ICD-10-CM | POA: Diagnosis not present

## 2021-05-06 DIAGNOSIS — D509 Iron deficiency anemia, unspecified: Secondary | ICD-10-CM | POA: Diagnosis not present

## 2021-05-06 DIAGNOSIS — E785 Hyperlipidemia, unspecified: Secondary | ICD-10-CM | POA: Diagnosis not present

## 2021-05-06 DIAGNOSIS — I10 Essential (primary) hypertension: Secondary | ICD-10-CM | POA: Diagnosis not present

## 2021-05-06 DIAGNOSIS — I351 Nonrheumatic aortic (valve) insufficiency: Secondary | ICD-10-CM | POA: Diagnosis not present

## 2021-05-06 DIAGNOSIS — S72001D Fracture of unspecified part of neck of right femur, subsequent encounter for closed fracture with routine healing: Secondary | ICD-10-CM | POA: Diagnosis not present

## 2021-05-06 DIAGNOSIS — I4892 Unspecified atrial flutter: Secondary | ICD-10-CM | POA: Diagnosis not present

## 2021-05-06 DIAGNOSIS — E440 Moderate protein-calorie malnutrition: Secondary | ICD-10-CM | POA: Diagnosis not present

## 2021-05-06 DIAGNOSIS — Z96641 Presence of right artificial hip joint: Secondary | ICD-10-CM | POA: Diagnosis not present

## 2021-05-06 DIAGNOSIS — I480 Paroxysmal atrial fibrillation: Secondary | ICD-10-CM | POA: Diagnosis not present

## 2021-05-07 ENCOUNTER — Telehealth: Payer: Self-pay | Admitting: *Deleted

## 2021-05-07 NOTE — Telephone Encounter (Signed)
FYI: Vm from [place] [facility] w/ [facility] Seeing pt for PT from Orthopedic for her hip Pt c/o pain in her upper back, she was reaching over her head about 2 wks ago and pulled it, it was doing better till a few days ago Instructed pt to rest, no stretching or lifting over her head and to contact PCP if this does not get any better

## 2021-05-13 DIAGNOSIS — I480 Paroxysmal atrial fibrillation: Secondary | ICD-10-CM | POA: Diagnosis not present

## 2021-05-13 DIAGNOSIS — D509 Iron deficiency anemia, unspecified: Secondary | ICD-10-CM | POA: Diagnosis not present

## 2021-05-13 DIAGNOSIS — S72001D Fracture of unspecified part of neck of right femur, subsequent encounter for closed fracture with routine healing: Secondary | ICD-10-CM | POA: Diagnosis not present

## 2021-05-13 DIAGNOSIS — I4892 Unspecified atrial flutter: Secondary | ICD-10-CM | POA: Diagnosis not present

## 2021-05-13 DIAGNOSIS — I351 Nonrheumatic aortic (valve) insufficiency: Secondary | ICD-10-CM | POA: Diagnosis not present

## 2021-05-13 DIAGNOSIS — E440 Moderate protein-calorie malnutrition: Secondary | ICD-10-CM | POA: Diagnosis not present

## 2021-05-13 DIAGNOSIS — I10 Essential (primary) hypertension: Secondary | ICD-10-CM | POA: Diagnosis not present

## 2021-05-13 DIAGNOSIS — Z96641 Presence of right artificial hip joint: Secondary | ICD-10-CM | POA: Diagnosis not present

## 2021-05-13 DIAGNOSIS — E785 Hyperlipidemia, unspecified: Secondary | ICD-10-CM | POA: Diagnosis not present

## 2021-05-22 ENCOUNTER — Encounter: Payer: Medicare PPO | Admitting: Orthopaedic Surgery

## 2021-05-23 ENCOUNTER — Ambulatory Visit (INDEPENDENT_AMBULATORY_CARE_PROVIDER_SITE_OTHER): Payer: Medicare PPO | Admitting: Family Medicine

## 2021-05-23 ENCOUNTER — Ambulatory Visit: Payer: Medicare PPO | Admitting: Family

## 2021-05-23 ENCOUNTER — Encounter: Payer: Self-pay | Admitting: Family Medicine

## 2021-05-23 DIAGNOSIS — J019 Acute sinusitis, unspecified: Secondary | ICD-10-CM | POA: Diagnosis not present

## 2021-05-23 DIAGNOSIS — H35351 Cystoid macular degeneration, right eye: Secondary | ICD-10-CM | POA: Diagnosis not present

## 2021-05-23 MED ORDER — GUAIFENESIN ER 600 MG PO TB12: 600.0000 mg | ORAL_TABLET | Freq: Two times a day (BID) | ORAL | 0 refills | Status: DC | PRN

## 2021-05-23 MED ORDER — AMOXICILLIN 875 MG PO TABS: 875.0000 mg | ORAL_TABLET | Freq: Two times a day (BID) | ORAL | 0 refills | Status: AC

## 2021-05-23 NOTE — Progress Notes (Signed)
   Virtual Visit  Note Due to COVID-19 pandemic this visit was conducted virtually. This visit type was conducted due to national recommendations for restrictions regarding the COVID-19 Pandemic (e.g. social distancing, sheltering in place) in an effort to limit this patient's exposure and mitigate transmission in our community. All issues noted in this document were discussed and addressed.  A physical exam was not performed with this format.  I connected with Valerie [name] on 05/23/21 at 1312 by telephone and verified that I am speaking with the correct person using two identifiers. Valerie [name] is currently located at home and her grandson is currently with her during the visit. The provider, Gwenlyn Perking, FNP is located in their office at time of visit.  I discussed the limitations, risks, security and privacy concerns of performing an evaluation and management service by telephone and the availability of in person appointments. I also discussed with the patient that there may be a patient responsible charge related to this service. The patient expressed understanding and agreed to proceed.  CC: congestion  History and Present Illness:  HPI Valerie [name] reports head congestion, postnasal drip, cough with white phlegm, an sinus pressure for 2 weeks. She denies fever, nausea, vomiting, chest pain, or shortness of breath. She has take coricidin and Flonase with some little improvement. She reports symptoms have been unchanged.     ROS As per HPI.   Observations/Objective: Alert and oriented x 3. Able to speak in full sentences without difficulty.    Assessment and Plan: Valerie [name] was seen today for nasal congestion.  Diagnoses and all orders for this visit:  Acute non-recurrent sinusitis, unspecified location Amoxicillin as below. Plain mucinex as below. Continue flonase. Discussed symptomatic care and return precautions.  -     amoxicillin (AMOXIL) 875 MG tablet; Take 1 tablet (875 mg total) by  mouth 2 (two) times daily for 10 days. -     guaiFENesin (MUCINEX) 600 MG 12 hr tablet; Take 1-2 tablets (600-1,200 mg total) by mouth 2 (two) times daily as needed for to loosen phlegm.   Follow Up Instructions: Return to office for new or worsening symptoms, or if symptoms persist.     I discussed the assessment and treatment plan with the patient. The patient was provided an opportunity to ask questions and all were answered. The patient agreed with the plan and demonstrated an understanding of the instructions.   The patient was advised to call back or seek an in-person evaluation if the symptoms worsen or if the condition fails to improve as anticipated.  The above assessment and management plan was discussed with the patient. The patient verbalized understanding of and has agreed to the management plan. Patient is aware to call the clinic if symptoms persist or worsen. Patient is aware when to return to the clinic for a follow-up visit. Patient educated on when it is appropriate to go to the emergency department.   Time call ended:  1325  I provided 13 minutes of  non face-to-face time during this encounter.    Gwenlyn Perking, FNP

## 2021-06-06 ENCOUNTER — Encounter: Payer: Self-pay | Admitting: Family

## 2021-06-06 ENCOUNTER — Ambulatory Visit (INDEPENDENT_AMBULATORY_CARE_PROVIDER_SITE_OTHER): Payer: Medicare PPO | Admitting: Family

## 2021-06-06 DIAGNOSIS — B9689 Other specified bacterial agents as the cause of diseases classified elsewhere: Secondary | ICD-10-CM

## 2021-06-06 DIAGNOSIS — J208 Acute bronchitis due to other specified organisms: Secondary | ICD-10-CM

## 2021-06-06 MED ORDER — GUAIFENESIN ER 600 MG PO TB12: 600.0000 mg | ORAL_TABLET | Freq: Two times a day (BID) | ORAL | 1 refills | Status: DC | PRN

## 2021-06-06 MED ORDER — DOXYCYCLINE HYCLATE 100 MG PO TABS: 100.0000 mg | ORAL_TABLET | Freq: Two times a day (BID) | ORAL | 0 refills | Status: DC

## 2021-06-06 NOTE — Progress Notes (Signed)
Virtual Visit  Note Due to COVID-19 pandemic this visit was conducted virtually. This visit type was conducted due to national recommendations for restrictions regarding the COVID-19 Pandemic (e.g. social distancing, sheltering in place) in an effort to limit this patient's exposure and mitigate transmission in our community. All issues noted in this document were discussed and addressed.  A physical exam was not performed with this format.  I connected with Valerie [name] on 06/06/21 at 11:41 AM  by telephone and verified that I am speaking with the correct person using two identifiers. Valerie [name] is currently located at home and no one is currently with her during visit. The provider, Evelina Dun, FNP is located in their office at time of visit.  I discussed the limitations, risks, security and privacy concerns of performing an evaluation and management service by telephone and the availability of in person appointments. I also discussed with the patient that there may be a patient responsible charge related to this service. The patient expressed understanding and agreed to proceed.  [name], mcmurdo are scheduled for a virtual visit with your provider today.    Just as we do with appointments in the office, we must obtain your consent to participate.  Your consent will be active for this visit and any virtual visit you may have with one of our providers in the next 365 days.    If you have a MyChart account, I can also send a copy of this consent to you electronically.  All virtual visits are billed to your insurance company just like a traditional visit in the office.  As this is a virtual visit, video technology does not allow for your provider to perform a traditional examination.  This may limit your provider's ability to fully assess your condition.  If your provider identifies any concerns that need to be evaluated in person or the need to arrange testing such as labs, EKG, etc, we will make  arrangements to do so.    Although advances in technology are sophisticated, we cannot ensure that it will always work on either your end or our end.  If the connection with a video visit is poor, we may have to switch to a telephone visit.  With either a video or telephone visit, we are not always able to ensure that we have a secure connection.   I need to obtain your verbal consent now.   Are you willing to proceed with your visit today?   Valerie [name] has provided verbal consent on 06/06/2021 for a virtual visit (video or telephone).   Evelina Dun, [place] 06/06/2021  11:44 AM     History and Present Illness:  Cough This is a recurrent problem. The current episode started 1 to 4 weeks ago. The problem has been waxing and waning. The cough is Productive of sputum. Associated symptoms include nasal congestion and wheezing. Pertinent negatives include no chills, ear congestion, ear pain, fever, headaches, myalgias or shortness of breath. She has tried rest for the symptoms. The treatment provided mild relief.     Review of Systems  Constitutional:  Negative for chills and fever.  HENT:  Negative for ear pain.   Respiratory:  Positive for cough and wheezing. Negative for shortness of breath.   Musculoskeletal:  Negative for myalgias.  Neurological:  Negative for headaches.    Observations/Objective: No SOB or distress noted,  intermittent cough  Assessment and Plan: 1. Acute bacterial bronchitis Pt can not come to  office for chest x-ray, given age and weekend will treat with doxycycline Start mucinex  Force fluids Needs to be seen if symptoms worsen or do not improve   - guaiFENesin (MUCINEX) 600 MG 12 hr tablet; Take 1-2 tablets (600-1,200 mg total) by mouth 2 (two) times daily as needed for to loosen phlegm.  Dispense: 60 tablet; Refill: 1 - doxycycline (VIBRA-TABS) 100 MG tablet; Take 1 tablet (100 mg total) by mouth 2 (two) times daily.  Dispense: 20 tablet; Refill: 0  I  discussed the assessment and treatment plan with the patient. The patient was provided an opportunity to ask questions and all were answered. The patient agreed with the plan and demonstrated an understanding of the instructions.   The patient was advised to call back or seek an in-person evaluation if the symptoms worsen or if the condition fails to improve as anticipated.  The above assessment and management plan was discussed with the patient. The patient verbalized understanding of and has agreed to the management plan. Patient is aware to call the clinic if symptoms persist or worsen. Patient is aware when to return to the clinic for a follow-up visit. Patient educated on when it is appropriate to go to the emergency department.   Time call ended:  11:53 AM   I provided 12 minutes of  non face-to-face time during this encounter.    Evelina Dun, FNP

## 2021-06-13 ENCOUNTER — Ambulatory Visit (INDEPENDENT_AMBULATORY_CARE_PROVIDER_SITE_OTHER): Payer: Medicare PPO | Admitting: Family Medicine

## 2021-06-13 ENCOUNTER — Encounter: Payer: Self-pay | Admitting: Family Medicine

## 2021-06-13 ENCOUNTER — Ambulatory Visit (INDEPENDENT_AMBULATORY_CARE_PROVIDER_SITE_OTHER): Payer: Medicare PPO

## 2021-06-13 VITALS — BP 147/69 | HR 65 | Temp 97.6°F

## 2021-06-13 DIAGNOSIS — R059 Cough, unspecified: Secondary | ICD-10-CM

## 2021-06-15 ENCOUNTER — Encounter: Payer: Self-pay | Admitting: Family Medicine

## 2021-06-15 NOTE — Progress Notes (Signed)
Assessment & Plan:  1. Cough Discussed the cough is often the last symptom to resolve and sometimes lingers for months after respiratory illnesses in the elderly. She would like to proceed with a chest x-ray.  - DG Chest 2 View   Follow up plan: Return if symptoms worsen or fail to improve.  Hendricks Limes, MSN, APRN, FNP-C [facility]  Subjective:   Patient ID: Valerie [name], female    DOB: Dec 03, 1927, 87 y.o.   MRN: [id number]  HPI: Valerie [name] is a 87 y.o. female presenting on 06/13/2021 for Cough and Wheezing (Since visit on 8/26 and states she is some better. )  Patient is here to obtain a chest x-ray after receiving two rounds of antibiotics over the past few weeks. She states all of her symptoms have resolved except her cough. She is feeling much better, but would like to proceed with the x-ray due to her coughing. She does occasionally get up some sputum.    ROS: Negative unless specifically indicated above in HPI.   Relevant past medical history reviewed and updated as indicated.   Allergies and medications reviewed and updated.   Current Outpatient Medications:    acetaminophen (TYLENOL) 325 MG tablet, Take 650 mg by mouth every 6 (six) hours as needed for headache (pain)., Disp: , Rfl:    amLODipine (NORVASC) 10 MG tablet, Take 1 tablet (10 mg total) by mouth daily., Disp: 90 tablet, Rfl: 1   apixaban (ELIQUIS) 2.5 MG TABS tablet, TAKE  (1)  TABLET TWICE A DAY., Disp: 180 tablet, Rfl: 1   carvedilol (COREG) 3.125 MG tablet, TAKE  (1)  TABLET TWICE A DAY WITH MEALS (BREAKFAST AND SUPPER), Disp: 180 tablet, Rfl: 1   docusate sodium (COLACE) 100 MG capsule, Take 1 capsule (100 mg total) by mouth 2 (two) times daily., Disp: 10 capsule, Rfl: 0   doxycycline (VIBRA-TABS) 100 MG tablet, Take 1 tablet (100 mg total) by mouth 2 (two) times daily., Disp: 20 tablet, Rfl: 0   fluticasone (FLONASE) 50 MCG/ACT nasal spray, Place 2 sprays into both nostrils as  needed for allergies or rhinitis., Disp: 16 g, Rfl: 2   guaiFENesin (MUCINEX) 600 MG 12 hr tablet, Take 1-2 tablets (600-1,200 mg total) by mouth 2 (two) times daily as needed for to loosen phlegm., Disp: 60 tablet, Rfl: 1   HYDROcodone-acetaminophen (NORCO) 5-325 MG tablet, Take 1-2 tablets by mouth every 6 (six) hours as needed for moderate pain., Disp: 30 tablet, Rfl: 0   losartan (COZAAR) 100 MG tablet, Take 1 tablet (100 mg total) by mouth daily., Disp: 90 tablet, Rfl: 1   methimazole (TAPAZOLE) 5 MG tablet, TAKE 1 TABLET DAILY, Disp: 90 tablet, Rfl: 0   nitroGLYCERIN (NITROSTAT) 0.4 MG SL tablet, PLACE 1 TABLET UNDER THE TONGUE AT ONSET OF CHEST PAIN EVERY 5 MINTUES UP TO 3 TIMES AS NEEDED, Disp: 25 tablet, Rfl: 0   pantoprazole (PROTONIX) 40 MG tablet, Take 1 tablet (40 mg total) by mouth daily., Disp: 90 tablet, Rfl: 1   polyethylene glycol (MIRALAX / GLYCOLAX) 17 g packet, Take 17 g by mouth daily as needed for mild constipation., Disp: 14 each, Rfl: 0   polyvinyl alcohol (LIQUIFILM TEARS) 1.4 % ophthalmic solution, Place 1 drop into both eyes as needed for dry eyes., Disp: , Rfl:    pravastatin (PRAVACHOL) 10 MG tablet, Take 1 tablet (10 mg total) by mouth daily., Disp: 90 tablet, Rfl: 1   Vitamin D, Ergocalciferol, (DRISDOL)  1.25 MG (50000 UNIT) CAPS capsule, Take 1 capsule (50,000 Units total) by mouth every 7 (seven) days., Disp: 12 capsule, Rfl: 0  Allergies  Allergen Reactions   Aricept [Donepezil Hcl] Other (See Comments)    Nightmares, near syncope, weak, decreased appetite.   Lisinopril Other (See Comments)    Hair loss   Sulfa Antibiotics Nausea Only   Sulfasalazine Nausea Only    Objective:   BP (!) 147/69   Pulse 65   Temp 97.6 F (36.4 C) (Temporal)   SpO2 97%    Physical Exam Vitals reviewed.  Constitutional:      General: She is not in acute distress.    Appearance: Normal appearance. She is not ill-appearing, toxic-appearing or diaphoretic.  HENT:      Head: Normocephalic and atraumatic.  Eyes:     General: No scleral icterus.       Right eye: No discharge.        Left eye: No discharge.     Conjunctiva/sclera: Conjunctivae normal.  Cardiovascular:     Rate and Rhythm: Normal rate and regular rhythm.     Heart sounds: Normal heart sounds. No murmur heard.   No friction rub. No gallop.  Pulmonary:     Effort: Pulmonary effort is normal. No respiratory distress.     Breath sounds: Normal breath sounds. No stridor. No wheezing, rhonchi or rales.  Musculoskeletal:        General: Normal range of motion.     Cervical back: Normal range of motion.  Skin:    General: Skin is warm and dry.     Capillary Refill: Capillary refill takes less than 2 seconds.  Neurological:     General: No focal deficit present.     Mental Status: She is alert and oriented to person, place, and time. Mental status is at baseline.  Psychiatric:        Mood and Affect: Mood normal.        Behavior: Behavior normal.        Thought Content: Thought content normal.        Judgment: Judgment normal.

## 2021-07-14 ENCOUNTER — Ambulatory Visit: Payer: Medicare PPO | Admitting: Family Medicine

## 2021-07-14 DIAGNOSIS — D692 Other nonthrombocytopenic purpura: Secondary | ICD-10-CM | POA: Diagnosis not present

## 2021-07-14 DIAGNOSIS — S8012XA Contusion of left lower leg, initial encounter: Secondary | ICD-10-CM | POA: Diagnosis not present

## 2021-07-14 DIAGNOSIS — L03116 Cellulitis of left lower limb: Secondary | ICD-10-CM | POA: Diagnosis not present

## 2021-07-21 ENCOUNTER — Ambulatory Visit (INDEPENDENT_AMBULATORY_CARE_PROVIDER_SITE_OTHER): Payer: Medicare PPO

## 2021-07-21 ENCOUNTER — Other Ambulatory Visit: Payer: Self-pay

## 2021-07-21 ENCOUNTER — Encounter: Payer: Self-pay | Admitting: Nurse Practitioner

## 2021-07-21 DIAGNOSIS — Z23 Encounter for immunization: Secondary | ICD-10-CM | POA: Diagnosis not present

## 2021-07-25 ENCOUNTER — Ambulatory Visit (INDEPENDENT_AMBULATORY_CARE_PROVIDER_SITE_OTHER): Payer: Medicare PPO

## 2021-07-25 VITALS — Ht 64.0 in | Wt 101.0 lb

## 2021-07-25 DIAGNOSIS — Z681 Body mass index (BMI) 19 or less, adult: Secondary | ICD-10-CM | POA: Diagnosis not present

## 2021-07-25 DIAGNOSIS — Z741 Need for assistance with personal care: Secondary | ICD-10-CM | POA: Diagnosis not present

## 2021-07-25 DIAGNOSIS — Z0001 Encounter for general adult medical examination with abnormal findings: Secondary | ICD-10-CM

## 2021-07-25 DIAGNOSIS — Z599 Problem related to housing and economic circumstances, unspecified: Secondary | ICD-10-CM | POA: Diagnosis not present

## 2021-07-25 DIAGNOSIS — H409 Unspecified glaucoma: Secondary | ICD-10-CM

## 2021-07-25 DIAGNOSIS — E440 Moderate protein-calorie malnutrition: Secondary | ICD-10-CM | POA: Diagnosis not present

## 2021-07-25 DIAGNOSIS — Z0000 Encounter for general adult medical examination without abnormal findings: Secondary | ICD-10-CM

## 2021-07-25 DIAGNOSIS — H538 Other visual disturbances: Secondary | ICD-10-CM

## 2021-07-25 NOTE — Progress Notes (Signed)
Subjective:   Valerie [name] is a 87 y.o. female who presents for Medicare Annual (Subsequent) preventive examination.  Virtual Visit via Telephone Note  I connected with  Valerie [name] on 07/25/21 at  2:45 PM EDT by telephone and verified that I am speaking with the correct person using two identifiers.  Location: Patient: Home Provider: [facility] Persons participating in the virtual visit: patient/Nurse Health Advisor   I discussed the limitations, risks, security and privacy concerns of performing an evaluation and management service by telephone and the availability of in person appointments. The patient expressed understanding and agreed to proceed.  Interactive audio and video telecommunications were attempted between this nurse and patient, however failed, due to patient having technical difficulties OR patient did not have access to video capability.  We continued and completed visit with audio only.  Some vital signs may be absent or patient reported.   Valerie Nissan E Granite Godman, LPN   Review of Systems     Cardiac Risk Factors include: advanced age (>77men, >34 women);dyslipidemia;hypertension;sedentary lifestyle;Other (see comment), Risk factor comments: atherosclerosis, CHF, a.fib, aortic insufficiency, carotid stenosis, anemia     Objective:    Today's Vitals   07/25/21 1444  Weight: 101 lb (45.8 kg)  Height: 5\' 4"  (1.626 m)  PainSc: 8    Body mass index is 17.34 kg/m.  Advanced Directives 07/25/2021 03/16/2021 02/17/2019 01/14/2019 08/25/2017 10/20/2016 10/20/2016  Does Patient Have a Medical Advance Directive? Yes No No No No No No  Type of Paramedic of [place];Living will - - - - - -  Does patient want to make changes to medical advance directive? - - - - - - -  Copy of [place] in Chart? No - copy requested - - - - - -  Would patient like information on creating a medical advance directive? - No - Patient declined No - Patient  declined No - Patient declined - Yes (Inpatient - patient defers creating a medical advance directive at this time) -    Current Medications (verified) Outpatient Encounter Medications as of 07/25/2021  Medication Sig   acetaminophen (TYLENOL) 325 MG tablet Take 650 mg by mouth every 6 (six) hours as needed for headache (pain).   amLODipine (NORVASC) 10 MG tablet Take 1 tablet (10 mg total) by mouth daily.   apixaban (ELIQUIS) 2.5 MG TABS tablet TAKE  (1)  TABLET TWICE A DAY.   carvedilol (COREG) 3.125 MG tablet TAKE  (1)  TABLET TWICE A DAY WITH MEALS (BREAKFAST AND SUPPER)   docusate sodium (COLACE) 100 MG capsule Take 1 capsule (100 mg total) by mouth 2 (two) times daily.   doxycycline (VIBRA-TABS) 100 MG tablet Take 1 tablet (100 mg total) by mouth 2 (two) times daily.   fluticasone (FLONASE) 50 MCG/ACT nasal spray Place 2 sprays into both nostrils as needed for allergies or rhinitis.   guaiFENesin (MUCINEX) 600 MG 12 hr tablet Take 1-2 tablets (600-1,200 mg total) by mouth 2 (two) times daily as needed for to loosen phlegm.   HYDROcodone-acetaminophen (NORCO) 5-325 MG tablet Take 1-2 tablets by mouth every 6 (six) hours as needed for moderate pain.   losartan (COZAAR) 100 MG tablet Take 1 tablet (100 mg total) by mouth daily.   methimazole (TAPAZOLE) 5 MG tablet TAKE 1 TABLET DAILY   nitroGLYCERIN (NITROSTAT) 0.4 MG SL tablet PLACE 1 TABLET UNDER THE TONGUE AT ONSET OF CHEST PAIN EVERY 5 MINTUES UP TO 3 TIMES AS NEEDED  pantoprazole (PROTONIX) 40 MG tablet Take 1 tablet (40 mg total) by mouth daily.   polyethylene glycol (MIRALAX / GLYCOLAX) 17 g packet Take 17 g by mouth daily as needed for mild constipation.   polyvinyl alcohol (LIQUIFILM TEARS) 1.4 % ophthalmic solution Place 1 drop into both eyes as needed for dry eyes.   pravastatin (PRAVACHOL) 10 MG tablet Take 1 tablet (10 mg total) by mouth daily.   Vitamin D, Ergocalciferol, (DRISDOL) 1.25 MG (50000 UNIT) CAPS capsule Take 1  capsule (50,000 Units total) by mouth every 7 (seven) days.   No facility-administered encounter medications on file as of 07/25/2021.    Allergies (verified) Aricept [donepezil hcl], Lisinopril, Sulfa antibiotics, and Sulfasalazine   History: Past Medical History:  Diagnosis Date   Anxiety    Arthritis    Broken ribs    Carotid artery disease (HCC)    Cataract    Collar bone fracture    Right   Depression    Diverticulosis of colon with hemorrhage    Duodenitis    Esophageal reflux    Esophageal stricture    Essential hypertension    Glaucoma    Hiatal hernia    History of kidney stones     x1    Hyperlipidemia    IBS (irritable bowel syndrome)    Palpitations    UTI (urinary tract infection) March 2015   Past Surgical History:  Procedure Laterality Date   CHOLECYSTECTOMY     COLONOSCOPY     DILATION AND CURETTAGE OF UTERUS     EYE SURGERY Bilateral    total of 5   HIP ARTHROPLASTY Right 03/16/2021   Procedure: POSTERIOR ARTHROPLASTY BIPOLAR HIP (HEMIARTHROPLASTY);  Surgeon: [name], MD;  Location: [facility];  Service: Orthopedics;  Laterality: Right;   LASER PHOTO ABLATION Right 10/20/2016   Procedure: LASER PHOTO ABLATION;  Surgeon: Hayden Pedro, MD;  Location: [facility];  Service: Ophthalmology;  Laterality: Right;  Headscope laser   LEFT ROTATOR CUFF REPAIR X2 Left    PARS PLANA VITRECTOMY  10/20/2016   WITH 25G REMOVAL/SUTURE SECONDARY INTRAOCULAR LENS, GAS FLUID EXCHANGE    PARS PLANA VITRECTOMY Right 10/20/2016   Procedure: PARS PLANA VITRECTOMY WITH 25G REMOVAL/SUTURE SECONDARY INTRAOCULAR LENS, GAS FLUID EXCHANGE;  Surgeon: Hayden Pedro, MD;  Location: [facility];  Service: Ophthalmology;  Laterality: Right;   RIGHT ROTATOR CUFF REPAIR X1 Right    UPPER GASTROINTESTINAL ENDOSCOPY     with dilation   Family History  Problem Relation Age of Onset   Leukemia Mother    Colon cancer Father 74   Kidney cancer Brother    Bladder Cancer Brother    Stroke  Sister    Esophageal cancer Neg Hx    Stomach cancer Neg Hx    Social History   Socioeconomic History   Marital status: Widowed    Spouse name: Not on file   Number of children: 4   Years of education: Not on file   Highest education level: Not on file  Occupational History   Occupation: retired    Fish farm manager: RETIRED  Tobacco Use   Smoking status: Never   Smokeless tobacco: Never  Vaping Use   Vaping Use: Never used  Substance and Sexual Activity   Alcohol use: No   Drug use: No   Sexual activity: Not on file  Other Topics Concern   Not on file  Social History Narrative   Her grandson lives with her   She  is legally blind   Social Determinants of Health   Financial Resource Strain: Medium Risk   Difficulty of Paying Living Expenses: Somewhat hard  Food Insecurity: No Food Insecurity   Worried About Charity fundraiser in the Last Year: Never true   Ran Out of Food in the Last Year: Never true  Transportation Needs: No Transportation Needs   Lack of Transportation (Medical): No   Lack of Transportation (Non-Medical): No  Physical Activity: Insufficiently Active   Days of Exercise per Week: 7 days   Minutes of Exercise per Session: 10 min  Stress: Stress Concern Present   Feeling of Stress : Rather much  Social Connections: Moderately Integrated   Frequency of Communication with Friends and Family: More than three times a week   Frequency of Social Gatherings with Friends and Family: More than three times a week   Attends Religious Services: More than 4 times per year   Active Member of [employer] or Organizations: Yes   Attends Archivist Meetings: More than 4 times per year   Marital Status: Widowed    Tobacco Counseling Counseling given: Not Answered   Clinical Intake:  Pre-visit preparation completed: Yes  Pain : 0-10 Pain Score: 8  Pain Type: Chronic pain Pain Location: Shoulder Pain Orientation: Right Pain Descriptors / Indicators: Aching,  Sharp, Tender Pain Onset: More than a month ago Pain Frequency: Intermittent     BMI - recorded: 17.34 Nutritional Status: BMI <19  Underweight Nutritional Risks: Unintentional weight loss Diabetes: No  How often do you need to have someone help you when you read instructions, pamphlets, or other written materials from your doctor or pharmacy?: 4 - Often  Diabetic? no  Interpreter Needed?: No  Information entered by :: Shaterrica Territo, LPN   Activities of Daily Living In your present state of health, do you have any difficulty performing the following activities: 07/25/2021 03/20/2021  Hearing? N -  Vision? Y -  Difficulty concentrating or making decisions? Y -  Walking or climbing stairs? Y -  Dressing or bathing? Y -  Comment she can do this, but has trouble -  Doing errands, shopping? Y N  Preparing Food and eating ? Y -  Using the Toilet? N -  In the past six months, have you accidently leaked urine? Y -  Comment wears pads for protection -  Do you have problems with loss of bowel control? N -  Managing your Medications? Y -  Managing your Finances? N -  Housekeeping or managing your Housekeeping? Y -  Some recent data might be hidden    Patient Care Team: Chevis Pretty, FNP as PCP - General (Family Medicine) Nahser, Wonda Cheng, MD as PCP - Cardiology (Cardiology) Ellison Hughs Pearletha Forge, MD as Referring Physician (Ophthalmology)  Indicate any recent Medical Services you may have received from other than [facility] providers in the past year (date may be approximate).     Assessment:   This is a routine wellness examination for Deltha.  Hearing/Vision screen Hearing Screening - Comments:: Denies hearing difficulties  Vision Screening - Comments:: Very poor vision due to glaucoma - glasses help just a little - no longer seeing an eye doctor  Dietary issues and exercise activities discussed: Current Exercise Habits: Home exercise routine, Type of exercise:  stretching;walking, Time (Minutes): 10, Frequency (Times/Week): 7, Weekly Exercise (Minutes/Week): 70, Intensity: Mild, Exercise limited by: orthopedic condition(s);cardiac condition(s)   Goals Addressed  This Visit's Progress    Prevent falls         Depression Screen PHQ 2/9 Scores 07/25/2021 01/24/2021 10/28/2020 10/10/2020 10/10/2020 05/14/2020 12/22/2019  PHQ - 2 Score 0 0 0 3 0 0 0  PHQ- 9 Score - - - 15 - - -    Fall Risk Fall Risk  07/25/2021 01/24/2021 10/28/2020 10/10/2020 10/10/2020  Falls in the past year? 1 0 0 1 0  Number falls in past yr: 1 - - 1 -  Injury with Fall? 1 - - 1 -  Comment - - - - -  Risk Factor Category  - - - - -  Risk for fall due to : History of fall(s);Impaired balance/gait;Impaired vision;Medication side effect;Orthopedic patient - - History of fall(s);Impaired balance/gait -  Follow up Education provided;Falls prevention discussed - - Falls evaluation completed -    FALL RISK PREVENTION PERTAINING TO THE HOME:  Any stairs in or around the home? No  If so, are there any without handrails? No  Home free of loose throw rugs in walkways, pet beds, electrical cords, etc? Yes  Adequate lighting in your home to reduce risk of falls? Yes   ASSISTIVE DEVICES UTILIZED TO PREVENT FALLS:  Life alert? Yes  Use of a cane, walker or w/c? Yes  Grab bars in the bathroom? Yes  Shower chair or bench in shower? Yes  Elevated toilet seat or a handicapped toilet? Yes   TIMED UP AND GO:  Was the test performed? No . Telephonic visit  Cognitive Function: MMSE - Mini Mental State Exam 05/25/2016  Orientation to time 4  Orientation to Place 5  Registration 3  Attention/ Calculation 2  Recall 1  Language- name 2 objects 2  Language- repeat 1  Language- follow 3 step command 3  Language- read & follow direction 1  Write a sentence 1  Copy design 1  Total score 24     6CIT Screen 07/25/2021  What Year? 0 points  What month? 0 points  What time?  0 points  Count back from 20 0 points  Months in reverse 4 points  Repeat phrase 6 points  Total Score 10    Immunizations Immunization History  Administered Date(s) Administered   Fluad Quad(high Dose 65+) 07/04/2019, 07/01/2020, 07/21/2021   Influenza, High Dose Seasonal PF 12-22-29, 07/10/2017, 08/05/2018   Influenza,inj,Quad PF,6+ Mos 07/10/2013, 08/07/2015, 07/10/2016   Influenza-Unspecified 07/30/2014   Pneumococcal Conjugate-13 10/10/2020   Pneumococcal Polysaccharide-23 07/07/1995, 07/06/2001   Tdap 04/20/2020   Zoster Recombinat (Shingrix) 01/11/2017    TDAP status: Up to date  Flu Vaccine status: Up to date  Pneumococcal vaccine status: Up to date  Covid-19 vaccine status: Declined, Education has been provided regarding the importance of this vaccine but patient still declined. Advised may receive this vaccine at local pharmacy or Health Dept.or vaccine clinic. Aware to provide a copy of the vaccination record if obtained from local pharmacy or Health Dept. Verbalized acceptance and understanding.  Qualifies for Shingles Vaccine? Yes   Zostavax completed Yes   Shingrix Completed?: No.    Education has been provided regarding the importance of this vaccine. Patient has been advised to call insurance company to determine out of pocket expense if they have not yet received this vaccine. Advised may also receive vaccine at local pharmacy or Health Dept. Verbalized acceptance and understanding.  Screening Tests Health Maintenance  Topic Date Due   COVID-19 Vaccine (1) Never done   Zoster Vaccines- Shingrix (2 of  2) 03/08/2017   DEXA SCAN  10/28/2021 (Originally [birth year]/28/1994)   TETANUS/TDAP  04/20/2030   Pneumonia Vaccine 34+ Years old  Completed   INFLUENZA VACCINE  Completed   HPV VACCINES  Aged Out    Health Maintenance  Health Maintenance Due  Topic Date Due   COVID-19 Vaccine (1) Never done   Zoster Vaccines- Shingrix (2 of 2) 03/08/2017    Colorectal cancer  screening: No longer required.   Mammogram status: No longer required due to age.  Bone Density scan: declined  Lung Cancer Screening: (Low Dose CT Chest recommended if Age 62-80 years, 30 pack-year currently smoking OR have quit w/in 15years.) does not qualify  Additional Screening:  Hepatitis C Screening: does not qualify  Vision Screening: Recommended annual ophthalmology exams for early detection of glaucoma and other disorders of the eye. Is the patient up to date with their annual eye exam?  No  Who is the provider or what is the name of the office in which the patient attends annual eye exams? none If pt is not established with a provider, would they like to be referred to a provider to establish care? No .   Dental Screening: Recommended annual dental exams for proper oral hygiene  Community Resource Referral / Chronic Care Management: CRR required this visit?  No   CCM required this visit?  No      Plan:     I have personally reviewed and noted the following in the patient's chart:   Medical and social history Use of alcohol, tobacco or illicit drugs  Current medications and supplements including opioid prescriptions.  Functional ability and status Nutritional status Physical activity Advanced directives List of other physicians Hospitalizations, surgeries, and ER visits in previous 12 months Vitals Screenings to include cognitive, depression, and falls Referrals and appointments  In addition, I have reviewed and discussed with patient certain preventive protocols, quality metrics, and best practice recommendations. A written personalized care plan for preventive services as well as general preventive health recommendations were provided to patient.     Sandrea Hammond, LPN   21/19/4174   Nurse Notes: 6CIT = 10 Sent referrals to get assistance with home health aide and medication costs

## 2021-07-25 NOTE — Patient Instructions (Signed)
[name] , Thank you for taking time to come for your Medicare Wellness Visit. I appreciate your ongoing commitment to your health goals. Please review the following plan we discussed and let me know if I can assist you in the future.   Screening recommendations/referrals: Colonoscopy: No longer required Mammogram: No longer required Bone Density: No longer required Recommended yearly ophthalmology/optometry visit for glaucoma screening and checkup Recommended yearly dental visit for hygiene and checkup  Vaccinations: Influenza vaccine: Done 07/21/2021 - Repeat annually Pneumococcal vaccine: Done 07/06/2001 & 10/10/2020 Tdap vaccine: done 04/20/2020 - Repeat in 10 years  Shingles vaccine: Done 01/11/2017 - due for second dose   Covid-19: Declined  Advanced directives: Please bring a copy of your health care power of attorney and living will to the office to be added to your chart at your convenience.   Conditions/risks identified: I have put in a referral for community resources to see if you can get a home health aide to help you with daily things, like cooking and bathing, etc. I have also sent a message to our pharmacist to see if she can help you get your medicines cheaper.  Next appointment: Follow up in one year for your annual wellness visit    Preventive Care 65 Years and Older, Female Preventive care refers to lifestyle choices and visits with your health care provider that can promote health and wellness. What does preventive care include? A yearly physical exam. This is also called an annual well check. Dental exams once or twice a year. Routine eye exams. Ask your health care provider how often you should have your eyes checked. Personal lifestyle choices, including: Daily care of your teeth and gums. Regular physical activity. Eating a healthy diet. Avoiding tobacco and drug use. Limiting alcohol use. Practicing safe sex. Taking low-dose aspirin every day. Taking  vitamin and mineral supplements as recommended by your health care provider. What happens during an annual well check? The services and screenings done by your health care provider during your annual well check will depend on your age, overall health, lifestyle risk factors, and family history of disease. Counseling  Your health care provider may ask you questions about your: Alcohol use. Tobacco use. Drug use. Emotional well-being. Home and relationship well-being. Sexual activity. Eating habits. History of falls. Memory and ability to understand (cognition). Work and work Statistician. Reproductive health. Screening  You may have the following tests or measurements: Height, weight, and BMI. Blood pressure. Lipid and cholesterol levels. These may be checked every 5 years, or more frequently if you are over 39 years old. Skin check. Lung cancer screening. You may have this screening every year starting at age 37 if you have a 30-pack-year history of smoking and currently smoke or have quit within the past 15 years. Fecal occult blood test (FOBT) of the stool. You may have this test every year starting at age 67. Flexible sigmoidoscopy or colonoscopy. You may have a sigmoidoscopy every 5 years or a colonoscopy every 10 years starting at age 55. Hepatitis C blood test. Hepatitis B blood test. Sexually transmitted disease (STD) testing. Diabetes screening. This is done by checking your blood sugar (glucose) after you have not eaten for a while (fasting). You may have this done every 1-3 years. Bone density scan. This is done to screen for osteoporosis. You may have this done starting at age 77. Mammogram. This may be done every 1-2 years. Talk to your health care provider about how often you should have  regular mammograms. Talk with your health care provider about your test results, treatment options, and if necessary, the need for more tests. Vaccines  Your health care provider may  recommend certain vaccines, such as: Influenza vaccine. This is recommended every year. Tetanus, diphtheria, and acellular pertussis (Tdap, Td) vaccine. You may need a Td booster every 10 years. Zoster vaccine. You may need this after age 61. Pneumococcal 13-valent conjugate (PCV13) vaccine. One dose is recommended after age 87. Pneumococcal polysaccharide (PPSV23) vaccine. One dose is recommended after age 72. Talk to your health care provider about which screenings and vaccines you need and how often you need them. This information is not intended to replace advice given to you by your health care provider. Make sure you discuss any questions you have with your health care provider. Document Released: 10/11/2015 Document Revised: 06/03/2016 Document Reviewed: 07/16/2015 Elsevier Interactive Patient Education  2017 [employer] Prevention in the Home Falls can cause injuries. They can happen to people of all ages. There are many things you can do to make your home safe and to help prevent falls. What can I do on the outside of my home? Regularly fix the edges of walkways and driveways and fix any cracks. Remove anything that might make you trip as you walk through a door, such as a raised step or threshold. Trim any bushes or trees on the path to your home. Use bright outdoor lighting. Clear any walking paths of anything that might make someone trip, such as rocks or tools. Regularly check to see if handrails are loose or broken. Make sure that both sides of any steps have handrails. Any raised decks and porches should have guardrails on the edges. Have any leaves, snow, or ice cleared regularly. Use sand or salt on walking paths during winter. Clean up any spills in your garage right away. This includes oil or grease spills. What can I do in the bathroom? Use night lights. Install grab bars by the toilet and in the tub and shower. Do not use towel bars as grab bars. Use non-skid  mats or decals in the tub or shower. If you need to sit down in the shower, use a plastic, non-slip stool. Keep the floor dry. Clean up any water that spills on the floor as soon as it happens. Remove soap buildup in the tub or shower regularly. Attach bath mats securely with double-sided non-slip rug tape. Do not have throw rugs and other things on the floor that can make you trip. What can I do in the bedroom? Use night lights. Make sure that you have a light by your bed that is easy to reach. Do not use any sheets or blankets that are too big for your bed. They should not hang down onto the floor. Have a firm chair that has side arms. You can use this for support while you get dressed. Do not have throw rugs and other things on the floor that can make you trip. What can I do in the kitchen? Clean up any spills right away. Avoid walking on wet floors. Keep items that you use a lot in easy-to-reach places. If you need to reach something above you, use a strong step stool that has a grab bar. Keep electrical cords out of the way. Do not use floor polish or wax that makes floors slippery. If you must use wax, use non-skid floor wax. Do not have throw rugs and other things on the floor that  can make you trip. What can I do with my stairs? Do not leave any items on the stairs. Make sure that there are handrails on both sides of the stairs and use them. Fix handrails that are broken or loose. Make sure that handrails are as long as the stairways. Check any carpeting to make sure that it is firmly attached to the stairs. Fix any carpet that is loose or worn. Avoid having throw rugs at the top or bottom of the stairs. If you do have throw rugs, attach them to the floor with carpet tape. Make sure that you have a light switch at the top of the stairs and the bottom of the stairs. If you do not have them, ask someone to add them for you. What else can I do to help prevent falls? Wear shoes  that: Do not have high heels. Have rubber bottoms. Are comfortable and fit you well. Are closed at the toe. Do not wear sandals. If you use a stepladder: Make sure that it is fully opened. Do not climb a closed stepladder. Make sure that both sides of the stepladder are locked into place. Ask someone to hold it for you, if possible. Clearly mark and make sure that you can see: Any grab bars or handrails. First and last steps. Where the edge of each step is. Use tools that help you move around (mobility aids) if they are needed. These include: Canes. Walkers. Scooters. Crutches. Turn on the lights when you go into a dark area. Replace any light bulbs as soon as they burn out. Set up your furniture so you have a clear path. Avoid moving your furniture around. If any of your floors are uneven, fix them. If there are any pets around you, be aware of where they are. Review your medicines with your doctor. Some medicines can make you feel dizzy. This can increase your chance of falling. Ask your doctor what other things that you can do to help prevent falls. This information is not intended to replace advice given to you by your health care provider. Make sure you discuss any questions you have with your health care provider. Document Released: 07/11/2009 Document Revised: 02/20/2016 Document Reviewed: 10/19/2014 Elsevier Interactive Patient Education  2017 [employer].

## 2021-07-28 ENCOUNTER — Other Ambulatory Visit: Payer: Self-pay

## 2021-07-28 ENCOUNTER — Encounter: Payer: Self-pay | Admitting: Nurse Practitioner

## 2021-07-28 ENCOUNTER — Ambulatory Visit: Payer: Medicare PPO | Admitting: Nurse Practitioner

## 2021-07-28 VITALS — BP 136/70 | HR 63 | Temp 97.8°F | Resp 20 | Ht 64.0 in | Wt 105.0 lb

## 2021-07-28 DIAGNOSIS — I509 Heart failure, unspecified: Secondary | ICD-10-CM | POA: Diagnosis not present

## 2021-07-28 DIAGNOSIS — E0590 Thyrotoxicosis, unspecified without thyrotoxic crisis or storm: Secondary | ICD-10-CM

## 2021-07-28 DIAGNOSIS — I6523 Occlusion and stenosis of bilateral carotid arteries: Secondary | ICD-10-CM | POA: Diagnosis not present

## 2021-07-28 DIAGNOSIS — K5730 Diverticulosis of large intestine without perforation or abscess without bleeding: Secondary | ICD-10-CM

## 2021-07-28 DIAGNOSIS — E440 Moderate protein-calorie malnutrition: Secondary | ICD-10-CM | POA: Diagnosis not present

## 2021-07-28 DIAGNOSIS — E782 Mixed hyperlipidemia: Secondary | ICD-10-CM

## 2021-07-28 DIAGNOSIS — E538 Deficiency of other specified B group vitamins: Secondary | ICD-10-CM | POA: Diagnosis not present

## 2021-07-28 DIAGNOSIS — I480 Paroxysmal atrial fibrillation: Secondary | ICD-10-CM | POA: Diagnosis not present

## 2021-07-28 DIAGNOSIS — I119 Hypertensive heart disease without heart failure: Secondary | ICD-10-CM

## 2021-07-28 DIAGNOSIS — K222 Esophageal obstruction: Secondary | ICD-10-CM

## 2021-07-28 MED ORDER — CARVEDILOL 3.125 MG PO TABS: ORAL_TABLET | ORAL | 1 refills | Status: DC

## 2021-07-28 MED ORDER — AMLODIPINE BESYLATE 10 MG PO TABS: 10.0000 mg | ORAL_TABLET | Freq: Every day | ORAL | 1 refills | Status: DC

## 2021-07-28 MED ORDER — APIXABAN 2.5 MG PO TABS: ORAL_TABLET | ORAL | 1 refills | Status: DC

## 2021-07-28 MED ORDER — PRAVASTATIN SODIUM 10 MG PO TABS: 10.0000 mg | ORAL_TABLET | Freq: Every day | ORAL | 1 refills | Status: DC

## 2021-07-28 MED ORDER — PANTOPRAZOLE SODIUM 40 MG PO TBEC: 40.0000 mg | DELAYED_RELEASE_TABLET | Freq: Every day | ORAL | 1 refills | Status: DC

## 2021-07-28 MED ORDER — LOSARTAN POTASSIUM 100 MG PO TABS: 100.0000 mg | ORAL_TABLET | Freq: Every day | ORAL | 1 refills | Status: DC

## 2021-07-28 NOTE — Progress Notes (Signed)
Subjective:    Patient ID: Valerie [name], female    DOB: 1927/12/18, 87 y.o.   MRN: [id number]   Chief Complaint: medical management of chronic issues     HPI:  1. Essential hypertension No c/o chest pain, sob or headache. Does not check blood pressure at home. BP Readings from Last 3 Encounters:  07/28/21 (!) 152/67  06/13/21 (!) 147/69  03/20/21 (!) 158/68     2. Paroxysmal atrial fibrillation ([place]) Has had several telephone episodes with cardiology but no in person visit. She denies heart racing or palpitations. Is on eliquis daily. Denies any bleeding issues.  3. Asymptomatic bilateral carotid artery stenosis Last carotid duplex was done on 10/21/17. Showed less than 50% stenosis. Was to be repeated in 2 years.  4. Diverticulosis of colon Denies any recent flare ups  5. Hyperthyroidism No problems that she is aware of Lab Results  Component Value Date   TSH 2.510 10/28/2020     6. Mixed hyperlipidemia Has very poor appetite and eating habits. Doe sno exercise Lab Results  Component Value Date   CHOL 198 10/28/2020   HDL 68 10/28/2020   LDLCALC 113 (H) 10/28/2020   TRIG 94 10/28/2020   CHOLHDL 2.9 10/28/2020     7. Malnutrition of moderate degree Again, is not a big eater. Wt Readings from Last 3 Encounters:  07/28/21 105 lb (47.6 kg)  07/25/21 101 lb (45.8 kg)  04/10/21 109 lb (49.4 kg)   BMI Readings from Last 3 Encounters:  07/28/21 18.02 kg/m  07/25/21 17.34 kg/m  04/10/21 18.71 kg/m     8. Vitamin B12 deficiency Is not on b12 supplement Lab Results  Component Value Date   VITAMINB12 282 10/28/2020   9 CHF Is on coreg daily and is doing well. Denies any lower ext edema.  Outpatient Encounter Medications as of 07/28/2021  Medication Sig   acetaminophen (TYLENOL) 325 MG tablet Take 650 mg by mouth every 6 (six) hours as needed for headache (pain).   amLODipine (NORVASC) 10 MG tablet Take 1 tablet (10 mg total) by mouth daily.    apixaban (ELIQUIS) 2.5 MG TABS tablet TAKE  (1)  TABLET TWICE A DAY.   carvedilol (COREG) 3.125 MG tablet TAKE  (1)  TABLET TWICE A DAY WITH MEALS (BREAKFAST AND SUPPER)   docusate sodium (COLACE) 100 MG capsule Take 1 capsule (100 mg total) by mouth 2 (two) times daily.   doxycycline (VIBRA-TABS) 100 MG tablet Take 1 tablet (100 mg total) by mouth 2 (two) times daily.   fluticasone (FLONASE) 50 MCG/ACT nasal spray Place 2 sprays into both nostrils as needed for allergies or rhinitis.   guaiFENesin (MUCINEX) 600 MG 12 hr tablet Take 1-2 tablets (600-1,200 mg total) by mouth 2 (two) times daily as needed for to loosen phlegm.   HYDROcodone-acetaminophen (NORCO) 5-325 MG tablet Take 1-2 tablets by mouth every 6 (six) hours as needed for moderate pain.   losartan (COZAAR) 100 MG tablet Take 1 tablet (100 mg total) by mouth daily.   methimazole (TAPAZOLE) 5 MG tablet TAKE 1 TABLET DAILY   nitroGLYCERIN (NITROSTAT) 0.4 MG SL tablet PLACE 1 TABLET UNDER THE TONGUE AT ONSET OF CHEST PAIN EVERY 5 MINTUES UP TO 3 TIMES AS NEEDED   pantoprazole (PROTONIX) 40 MG tablet Take 1 tablet (40 mg total) by mouth daily.   polyethylene glycol (MIRALAX / GLYCOLAX) 17 g packet Take 17 g by mouth daily as needed for mild constipation.   polyvinyl alcohol (  LIQUIFILM TEARS) 1.4 % ophthalmic solution Place 1 drop into both eyes as needed for dry eyes.   pravastatin (PRAVACHOL) 10 MG tablet Take 1 tablet (10 mg total) by mouth daily.   Vitamin D, Ergocalciferol, (DRISDOL) 1.25 MG (50000 UNIT) CAPS capsule Take 1 capsule (50,000 Units total) by mouth every 7 (seven) days.   No facility-administered encounter medications on file as of 07/28/2021.    Past Surgical History:  Procedure Laterality Date   CHOLECYSTECTOMY     COLONOSCOPY     DILATION AND CURETTAGE OF UTERUS     EYE SURGERY Bilateral    total of 5   HIP ARTHROPLASTY Right 03/16/2021   Procedure: POSTERIOR ARTHROPLASTY BIPOLAR HIP (HEMIARTHROPLASTY);  Surgeon:  [name], MD;  Location: [facility];  Service: Orthopedics;  Laterality: Right;   LASER PHOTO ABLATION Right 10/20/2016   Procedure: LASER PHOTO ABLATION;  Surgeon: Hayden Pedro, MD;  Location: [facility];  Service: Ophthalmology;  Laterality: Right;  Headscope laser   LEFT ROTATOR CUFF REPAIR X2 Left    PARS PLANA VITRECTOMY  10/20/2016   WITH 25G REMOVAL/SUTURE SECONDARY INTRAOCULAR LENS, GAS FLUID EXCHANGE    PARS PLANA VITRECTOMY Right 10/20/2016   Procedure: PARS PLANA VITRECTOMY WITH 25G REMOVAL/SUTURE SECONDARY INTRAOCULAR LENS, GAS FLUID EXCHANGE;  Surgeon: Hayden Pedro, MD;  Location: [facility];  Service: Ophthalmology;  Laterality: Right;   RIGHT ROTATOR CUFF REPAIR X1 Right    UPPER GASTROINTESTINAL ENDOSCOPY     with dilation    Family History  Problem Relation Age of Onset   Leukemia Mother    Colon cancer Father 72   Kidney cancer Brother    Bladder Cancer Brother    Stroke Sister    Esophageal cancer Neg Hx    Stomach cancer Neg Hx     New complaints: None today  Social history: Lives by herself  Controlled substance contract: n/a     Review of Systems  Constitutional:  Negative for diaphoresis.  Eyes:  Negative for pain.  Respiratory:  Negative for shortness of breath.   Cardiovascular:  Negative for chest pain, palpitations and leg swelling.  Gastrointestinal:  Negative for abdominal pain.  Endocrine: Negative for polydipsia.  Skin:  Negative for rash.  Neurological:  Negative for dizziness, weakness and headaches.  Hematological:  Does not bruise/bleed easily.  All other systems reviewed and are negative.     Objective:   Physical Exam Vitals and nursing note reviewed.  Constitutional:      General: She is not in acute distress.    Appearance: Normal appearance. She is well-developed.  HENT:     Head: Normocephalic.     Right Ear: Tympanic membrane normal.     Left Ear: Tympanic membrane normal.     Nose: Nose normal.     Mouth/Throat:      Mouth: Mucous membranes are moist.  Eyes:     Pupils: Pupils are equal, round, and reactive to light.  Neck:     Vascular: No carotid bruit or JVD.  Cardiovascular:     Rate and Rhythm: Normal rate and regular rhythm.     Heart sounds: Normal heart sounds.  Pulmonary:     Effort: Pulmonary effort is normal. No respiratory distress.     Breath sounds: Normal breath sounds. No wheezing or rales.  Chest:     Chest wall: No tenderness.  Abdominal:     General: Bowel sounds are normal. There is no distension or abdominal bruit.  Palpations: Abdomen is soft. There is no hepatomegaly, splenomegaly, mass or pulsatile mass.     Tenderness: There is no abdominal tenderness.  Musculoskeletal:        General: Normal range of motion.     Cervical back: Normal range of motion and neck supple.  Lymphadenopathy:     Cervical: No cervical adenopathy.  Skin:    General: Skin is warm and dry.     Comments: Has multiple wounds on bil lower ext- no sign of infection noted  Neurological:     Mental Status: She is alert and oriented to person, place, and time.     Deep Tendon Reflexes: Reflexes are normal and symmetric.  Psychiatric:        Behavior: Behavior normal.        Thought Content: Thought content normal.        Judgment: Judgment normal.    BP 136/70   Pulse 63   Temp 97.8 F (36.6 C) (Temporal)   Resp 20   Ht 5' 4"  (1.626 m)   Wt 105 lb (47.6 kg)   SpO2 98%   BMI 18.02 kg/m        Assessment & Plan:   Valerie [name] comes in today with chief complaint of Medical Management of Chronic Issues   Diagnosis and orders addressed:  1. Benign hypertensive heart disease without heart failure Low sodium diet - losartan (COZAAR) 100 MG tablet; Take 1 tablet (100 mg total) by mouth daily.  Dispense: 90 tablet; Refill: 1 - amLODipine (NORVASC) 10 MG tablet; Take 1 tablet (10 mg total) by mouth daily.  Dispense: 90 tablet; Refill: 1 - carvedilol (COREG) 3.125 MG tablet; TAKE  (1)   TABLET TWICE A DAY WITH MEALS (BREAKFAST AND SUPPER)  Dispense: 180 tablet; Refill: 1  2. Paroxysmal atrial fibrillation ([facility]) Avoid caffeine - apixaban (ELIQUIS) 2.5 MG TABS tablet; TAKE  (1)  TABLET TWICE A DAY.  Dispense: 180 tablet; Refill: 1  3. Asymptomatic bilateral carotid artery stenosis Will test later in the year  4. Diverticulosis of colon Watch diet to prevent flare up  5. Hyperthyroidism .lasttsh  6. Mixed hyperlipidemia Low fat diet - pravastatin (PRAVACHOL) 10 MG tablet; Take 1 tablet (10 mg total) by mouth daily.  Dispense: 90 tablet; Refill: 1  7. Malnutrition of moderate degree Eat 3 meals a day  8. Vitamin B12 deficiency Labs pending  9. Congestive heart failure, unspecified HF chronicity, unspecified heart failure type ([place])  10. Stricture esophagus Chew food well. - pantoprazole (PROTONIX) 40 MG tablet; Take 1 tablet (40 mg total) by mouth daily.  Dispense: 90 tablet; Refill: 1  Orders Placed This Encounter  Procedures   CBC with Differential/Platelet   CMP14+EGFR   Lipid panel   Vitamin B12     Labs pending Health Maintenance reviewed Diet and exercise encouraged  Follow up plan: 6 months   Mary-Margaret Hassell Done, FNP

## 2021-07-28 NOTE — Patient Instructions (Signed)

## 2021-07-29 LAB — CBC WITH DIFFERENTIAL/PLATELET
Basophils Absolute: 0 10*3/uL (ref 0.0–0.2)
Basos: 0 %
EOS (ABSOLUTE): 0.1 10*3/uL (ref 0.0–0.4)
Eos: 2 %
Hematocrit: 38.4 % (ref 34.0–46.6)
Hemoglobin: 13.1 g/dL (ref 11.1–15.9)
Immature Grans (Abs): 0 10*3/uL (ref 0.0–0.1)
Immature Granulocytes: 0 %
Lymphocytes Absolute: 2 10*3/uL (ref 0.7–3.1)
Lymphs: 31 %
MCH: 33.4 pg — ABNORMAL HIGH (ref 26.6–33.0)
MCHC: 34.1 g/dL (ref 31.5–35.7)
MCV: 98 fL — ABNORMAL HIGH (ref 79–97)
Monocytes Absolute: 0.6 10*3/uL (ref 0.1–0.9)
Monocytes: 9 %
Neutrophils Absolute: 3.9 10*3/uL (ref 1.4–7.0)
Neutrophils: 58 %
Platelets: 324 10*3/uL (ref 150–450)
RBC: 3.92 x10E6/uL (ref 3.77–5.28)
RDW: 12.7 % (ref 11.7–15.4)
WBC: 6.6 10*3/uL (ref 3.4–10.8)

## 2021-07-29 LAB — LIPID PANEL
Chol/HDL Ratio: 3.4 ratio (ref 0.0–4.4)
Cholesterol, Total: 189 mg/dL (ref 100–199)
HDL: 56 mg/dL (ref >39)
LDL Chol Calc (NIH): 126 mg/dL — ABNORMAL HIGH (ref 0–99)
Triglycerides: 38 mg/dL (ref 0–149)
VLDL Cholesterol Cal: 7 mg/dL (ref 5–40)

## 2021-07-29 LAB — CMP14+EGFR
ALT: 12 IU/L (ref 0–32)
AST: 19 IU/L (ref 0–40)
Albumin/Globulin Ratio: 1.5 (ref 1.2–2.2)
Albumin: 4.3 g/dL (ref 3.5–4.6)
Alkaline Phosphatase: 85 IU/L (ref 44–121)
BUN/Creatinine Ratio: 24 (ref 12–28)
BUN: 24 mg/dL (ref 10–36)
Bilirubin Total: 0.5 mg/dL (ref 0.0–1.2)
CO2: 22 mmol/L (ref 20–29)
Calcium: 10 mg/dL (ref 8.7–10.3)
Chloride: 103 mmol/L (ref 96–106)
Creatinine, Ser: 1 mg/dL (ref 0.57–1.00)
Globulin, Total: 2.8 g/dL (ref 1.5–4.5)
Glucose: 86 mg/dL (ref 70–99)
Potassium: 4.7 mmol/L (ref 3.5–5.2)
Sodium: 141 mmol/L (ref 134–144)
Total Protein: 7.1 g/dL (ref 6.0–8.5)
eGFR: 53 mL/min/{1.73_m2} — ABNORMAL LOW (ref >59)

## 2021-07-29 LAB — VITAMIN B12: Vitamin B-12: 293 pg/mL (ref 232–1245)

## 2021-08-01 ENCOUNTER — Telehealth: Payer: Self-pay

## 2021-08-01 NOTE — Chronic Care Management (AMB) (Signed)
  Chronic Care Management   Note  08/01/2021 Name: Valerie [name] MRN: [id number] DOB: Apr 22, 1928  Valerie [name] is a 87 y.o. year old female who is a primary care patient of Chevis Pretty, [place]. I reached out to [name] by phone today in response to a referral sent by Valerie [name]'s PCP.  Valerie [name] was given information about Chronic Care Management services today including:  CCM service includes personalized support from designated clinical staff supervised by her physician, including individualized plan of care and coordination with other care providers 24/7 contact phone numbers for assistance for urgent and routine care needs. Service will only be billed when office clinical staff spend 20 minutes or more in a month to coordinate care. Only one practitioner may furnish and bill the service in a calendar month. The patient may stop CCM services at any time (effective at the end of the month) by phone call to the office staff. The patient is responsible for co-pay (up to 20% after annual deductible is met) if co-pay is required by the individual health plan.   Patient agreed to services and verbal consent obtained.   Follow up plan: Telephone appointment with care management team member scheduled for: RN CM 08/06/2021 Pharm D 09/16/2021  Noreene Larsson, [address], [place], [place] [address] Direct Dial: [phone] [email or web address]: [email or web address]

## 2021-08-06 ENCOUNTER — Ambulatory Visit (INDEPENDENT_AMBULATORY_CARE_PROVIDER_SITE_OTHER): Payer: Medicare PPO | Admitting: *Deleted

## 2021-08-06 ENCOUNTER — Telehealth: Payer: Medicare PPO

## 2021-08-06 DIAGNOSIS — I480 Paroxysmal atrial fibrillation: Secondary | ICD-10-CM

## 2021-08-06 DIAGNOSIS — I509 Heart failure, unspecified: Secondary | ICD-10-CM

## 2021-08-06 DIAGNOSIS — H409 Unspecified glaucoma: Secondary | ICD-10-CM

## 2021-08-06 NOTE — Chronic Care Management (AMB) (Signed)
Chronic Care Management   CCM RN Visit Note  08/06/2021 Name: Valerie [name] MRN: [id number] DOB: [birth year]  Subjective: Valerie [name] is a 87 y.o. year old female who is a primary care patient of Chevis Pretty, [place]. The care management team was consulted for assistance with disease management and care coordination needs.    Engaged with patient by telephone for initial visit in response to provider referral for case management and/or care coordination services.   Consent to Services:  The patient was given the following information about Chronic Care Management services today, agreed to services, and gave verbal consent: 1. CCM service includes personalized support from designated clinical staff supervised by the primary care provider, including individualized plan of care and coordination with other care providers 2. 24/7 contact phone numbers for assistance for urgent and routine care needs. 3. Service will only be billed when office clinical staff spend 20 minutes or more in a month to coordinate care. 4. Only one practitioner may furnish and bill the service in a calendar month. 5.The patient may stop CCM services at any time (effective at the end of the month) by phone call to the office staff. 6. The patient will be responsible for cost sharing (co-pay) of up to 20% of the service fee (after annual deductible is met). Patient agreed to services and consent obtained.  Patient agreed to services and verbal consent obtained.   Assessment: Review of patient past medical history, allergies, medications, health status, including review of consultants reports, laboratory and other test data, was performed as part of comprehensive evaluation and provision of chronic care management services.   SDOH (Social Determinants of Health) assessments and interventions performed:    CCM Care Plan  Allergies  Allergen Reactions   Aricept [Donepezil Hcl] Other (See Comments)    Nightmares, near  syncope, weak, decreased appetite.   Lisinopril Other (See Comments)    Hair loss   Sulfa Antibiotics Nausea Only   Sulfasalazine Nausea Only    Outpatient Encounter Medications as of 08/06/2021  Medication Sig Note   acetaminophen (TYLENOL) 325 MG tablet Take 650 mg by mouth every 6 (six) hours as needed for headache (pain).    amLODipine (NORVASC) 10 MG tablet Take 1 tablet (10 mg total) by mouth daily.    apixaban (ELIQUIS) 2.5 MG TABS tablet TAKE  (1)  TABLET TWICE A DAY.    carvedilol (COREG) 3.125 MG tablet TAKE  (1)  TABLET TWICE A DAY WITH MEALS (BREAKFAST AND SUPPER)    docusate sodium (COLACE) 100 MG capsule Take 1 capsule (100 mg total) by mouth 2 (two) times daily.    fluticasone (FLONASE) 50 MCG/ACT nasal spray Place 2 sprays into both nostrils as needed for allergies or rhinitis.    losartan (COZAAR) 100 MG tablet Take 1 tablet (100 mg total) by mouth daily.    methimazole (TAPAZOLE) 5 MG tablet TAKE 1 TABLET DAILY    nitroGLYCERIN (NITROSTAT) 0.4 MG SL tablet PLACE 1 TABLET UNDER THE TONGUE AT ONSET OF CHEST PAIN EVERY 5 MINTUES UP TO 3 TIMES AS NEEDED    pantoprazole (PROTONIX) 40 MG tablet Take 1 tablet (40 mg total) by mouth daily.    polyethylene glycol (MIRALAX / GLYCOLAX) 17 g packet Take 17 g by mouth daily as needed for mild constipation.    polyvinyl alcohol (LIQUIFILM TEARS) 1.4 % ophthalmic solution Place 1 drop into both eyes as needed for dry eyes.    pravastatin (PRAVACHOL) 10 MG tablet Take  1 tablet (10 mg total) by mouth daily.    Vitamin D, Ergocalciferol, (DRISDOL) 1.25 MG (50000 UNIT) CAPS capsule Take 1 capsule (50,000 Units total) by mouth every 7 (seven) days. 03/15/2021: Mondays   No facility-administered encounter medications on file as of 08/06/2021.    Patient Active Problem List   Diagnosis Date Noted   Malnutrition of moderate degree 03/18/2021   Essential hypertension 03/16/2021   Femoral neck fracture ([place]) 03/15/2021   Hip fracture ([place])  03/15/2021   Aortic insufficiency 12/05/2019   Fatigue 08/29/2019   Atrial fibrillation ([place]) 03/06/2019   Congestive heart failure (CHF) ([place]) 02/17/2019   Left thyroid nodule 01/24/2019   Hyperthyroidism 01/14/2019   Bradycardia 04/06/2018   PVC (premature ventricular contraction) 11/26/2016   Hyperlipidemia 11/26/2016   Dislocated IOL (intraocular lens), posterior, right 10/20/2016   Posterior dislocation of iol (intraocular lens), right eye 10/20/2016   History of palpitations 06/30/2016   Blurry vision, bilateral    Glaucoma 05/25/2016   Thyroid nodule 05/20/2015   Carotid stenosis, non-symptomatic 09/14/2012   Internal and external hemorrhoids without complication 19/41/7408   Vitamin B12 deficiency 09/15/2011   Benign hypertensive heart disease without heart failure 05/22/2011   Stricture esophagus 05/18/2011   Stricture of duodenum 05/18/2011   Iron deficiency anemia 05/15/2011   Diverticulosis of colon 02/03/2008    Conditions to be addressed/monitored:Atrial Fibrillation, CHF, HTN, and glaucoma  Care Plan : [facility] Care Plan  Updates made by Ilean China, RN since 08/06/2021 12:00 AM     Problem: Chronic Disease Management Needs   Priority: High  Onset Date: 08/06/2021     Long-Range Goal: Develop a Plan of Care with RN Care Manager Regarding Care Management and Care Coordination Associated with Afib, CHF, HTN, and Glaucoma   Start Date: 08/06/2021  Expected End Date: 08/06/2022  This Visit's Progress: On track  Priority: High  Note:   Current Barriers:  Care Coordination needs related to Financial constraints related to cost of Eliquis  Chronic Disease Management support and education needs related to Atrial Fibrillation, CHF, HTN, and glaucoma Financial Constraints.   RNCM Clinical Goal(s):  Patient will continue to work with RN Care Manager and/or Social Worker to address care management and care coordination needs related to Atrial Fibrillation, CHF, HTN,  and glaucoma as evidenced by adherence to CM Team Scheduled appointments     through collaboration with RN Care manager, provider, and care team.   Interventions: 1:1 collaboration with primary care provider regarding development and update of comprehensive plan of care as evidenced by provider attestation and co-signature Inter-disciplinary care team collaboration (see longitudinal plan of care) Evaluation of current treatment plan related to  self management and patient's adherence to plan as established by provider Discussed family/social support Has support from son and daughter and adult grandson lives with her  Discussed diet/nutrition Decreased appetite but tries to eat regularly Does drink some Ensure but it is expensive. RNCM to assist with application for assistance from [employer]. Picks up 5 meals, milk, juice from [facility] [employer] on Mondays in [place] Provided with RNCM contact number and encouraged to reach out as needed   A-fib:  (Status: New goal.) Long Term Goal  Counseled on increased risk of stroke due to Afib and benefits of anticoagulation for stroke prevention           Reviewed importance of adherence to anticoagulant exactly as prescribed Counseled on bleeding risk associated with Eliquis and importance of self-monitoring for signs/symptoms of bleeding  Counseled on avoidance of NSAIDs due to increased bleeding risk with anticoagulants Counseled on seeking medical attention after a head injury or if there is blood in the urine/stool Assessed social determinant of health barriers Discussed cost of Eliquis. Would like assistance with cost. Discussed Eliquis prescription assistance and Medicare Extra Help Appointment scheduled with [place] next week to assist with online Medicare Extra Help application   Glaucoma:  (Status: New goal.) Long Term Goal  Discussed glaucoma and effect on daily life Does have limited vision Receives resources from an organization  for the blind [employer], magnifier, Geologist, engineering, etc Has help from her children and adult grandson, that lives with her Discussed prior treatment, current treatment, and prognosis Reviewed follow-up with ophthalmologist  Encouraged to reach out to ophthalmologist with any new or worsening symptoms   Patient Goals/Self-Care Activities: Patient will self administer medications as prescribed as evidenced by self report/primary caregiver report  Patient will attend all scheduled provider appointments as evidenced by clinician review of documented attendance to scheduled appointments and patient/caregiver report Patient will call pharmacy for medication refills as evidenced by patient report and review of pharmacy fill history as appropriate Patient will attend church or other social activities as evidenced by patient report Patient will call provider office for new concerns or questions as evidenced by review of documented incoming telephone call notes and patient report check pulse (heart) rate once a day Call cardiologist or PCP with any readings outside of recommended range Eat 3 meals a day and supplement with Boost/Ensure as needed Call RN Care Manager as needed [phone]  Plan:Telephone follow up appointment with care management team member scheduled for:  08/13/21 with RNCM The patient has been provided with contact information for the care management team and has been advised to call with any health related questions or concerns.   Chong Sicilian, BSN, RN-BC Embedded Chronic Care Manager [facility] / [place] Management Direct Dial: [phone]

## 2021-08-06 NOTE — Patient Instructions (Signed)
Visit Information  Patient Care Plan: Valerie [name] Care Plan     Problem Identified: Chronic Disease Management Needs   Priority: High  Onset Date: 08/06/2021     Long-Range Goal: Develop a Plan of Care with RN Care Manager Regarding Care Management and Care Coordination Associated with Afib, CHF, HTN, and Glaucoma   Start Date: 08/06/2021  Expected End Date: 08/06/2022  This Visit's Progress: On track  Priority: High  Note:   Current Barriers:  Care Coordination needs related to Financial constraints related to cost of Eliquis  Chronic Disease Management support and education needs related to Atrial Fibrillation, CHF, HTN, and glaucoma Financial Constraints.   RNCM Clinical Goal(s):  Patient will continue to work with RN Care Manager and/or Social Worker to address care management and care coordination needs related to Atrial Fibrillation, CHF, HTN, and glaucoma as evidenced by adherence to CM Team Scheduled appointments     through collaboration with RN Care manager, provider, and care team.   Interventions: 1:1 collaboration with primary care provider regarding development and update of comprehensive plan of care as evidenced by provider attestation and co-signature Inter-disciplinary care team collaboration (see longitudinal plan of care) Evaluation of current treatment plan related to  self management and patient's adherence to plan as established by provider Discussed family/social support Has support from son and daughter and adult grandson lives with her  Discussed diet/nutrition Decreased appetite but tries to eat regularly Does drink some Ensure but it is expensive. RNCM to assist with application for assistance from [employer]. Picks up 5 meals, milk, juice from [name] on Mondays in [place] Provided with RNCM contact number and encouraged to reach out as needed   A-fib:  (Status: New goal.) Long Term Goal  Counseled on increased risk of stroke due to Afib and benefits  of anticoagulation for stroke prevention           Reviewed importance of adherence to anticoagulant exactly as prescribed Counseled on bleeding risk associated with Eliquis and importance of self-monitoring for signs/symptoms of bleeding Counseled on avoidance of NSAIDs due to increased bleeding risk with anticoagulants Counseled on seeking medical attention after a head injury or if there is blood in the urine/stool Assessed social determinant of health barriers Discussed cost of Eliquis. Would like assistance with cost. Discussed Eliquis prescription assistance and Medicare Extra Help Appointment scheduled with [place] next week to assist with online Medicare Extra Help application   Glaucoma:  (Status: New goal.) Long Term Goal  Discussed glaucoma and effect on daily life Does have limited vision Receives resources from an organization for the blind [employer], magnifier, Geologist, engineering, etc Has help from her children and adult grandson, that lives with her Discussed prior treatment, current treatment, and prognosis Reviewed follow-up with ophthalmologist  Encouraged to reach out to ophthalmologist with any new or worsening symptoms   Patient Goals/Self-Care Activities: Patient will self administer medications as prescribed as evidenced by self report/primary caregiver report  Patient will attend all scheduled provider appointments as evidenced by clinician review of documented attendance to scheduled appointments and patient/caregiver report Patient will call pharmacy for medication refills as evidenced by patient report and review of pharmacy fill history as appropriate Patient will attend church or other social activities as evidenced by patient report Patient will call provider office for new concerns or questions as evidenced by review of documented incoming telephone call notes and patient report - check pulse (heart) rate once a day Call cardiologist or PCP  with any readings  outside of recommended range Eat 3 meals a day and supplement with Boost/Ensure as needed Call RN Care Manager as needed [phone]     Valerie [name] was given information about Chronic Care Management services today including:  CCM service includes personalized support from designated clinical staff supervised by her physician, including individualized plan of care and coordination with other care providers 24/7 contact phone numbers for assistance for urgent and routine care needs. Service will only be billed when office clinical staff spend 20 minutes or more in a month to coordinate care. Only one practitioner may furnish and bill the service in a calendar month. The patient may stop CCM services at any time (effective at the end of the month) by phone call to the office staff. The patient will be responsible for cost sharing (co-pay) of up to 20% of the service fee (after annual deductible is met).   Patient agreed to services and verbal consent obtained.    Patient verbalizes understanding of instructions provided today and agrees to view in [place].   Telephone follow up appointment with care management team member scheduled for: 08/13/21 with RNCM  Valerie [name], BSN, RN-BC [place] / [place] Management Direct Dial: [phone]

## 2021-08-13 ENCOUNTER — Telehealth: Payer: Medicare PPO

## 2021-08-15 ENCOUNTER — Ambulatory Visit (INDEPENDENT_AMBULATORY_CARE_PROVIDER_SITE_OTHER): Payer: Medicare PPO | Admitting: Family Medicine

## 2021-08-15 ENCOUNTER — Encounter: Payer: Self-pay | Admitting: Family Medicine

## 2021-08-15 DIAGNOSIS — M545 Low back pain, unspecified: Secondary | ICD-10-CM | POA: Diagnosis not present

## 2021-08-15 DIAGNOSIS — R109 Unspecified abdominal pain: Secondary | ICD-10-CM | POA: Diagnosis not present

## 2021-08-15 DIAGNOSIS — R399 Unspecified symptoms and signs involving the genitourinary system: Secondary | ICD-10-CM | POA: Diagnosis not present

## 2021-08-15 DIAGNOSIS — R443 Hallucinations, unspecified: Secondary | ICD-10-CM | POA: Diagnosis not present

## 2021-08-15 LAB — URINALYSIS, ROUTINE W REFLEX MICROSCOPIC
Bilirubin, UA: NEGATIVE
Glucose, UA: NEGATIVE
Ketones, UA: NEGATIVE
Nitrite, UA: NEGATIVE
Protein,UA: NEGATIVE
RBC, UA: NEGATIVE
Specific Gravity, UA: 1.01 (ref 1.005–1.030)
Urobilinogen, Ur: 0.2 mg/dL (ref 0.2–1.0)
pH, UA: 7 (ref 5.0–7.5)

## 2021-08-15 LAB — MICROSCOPIC EXAMINATION
RBC, Urine: NONE SEEN /hpf (ref 0–2)
Renal Epithel, UA: NONE SEEN /hpf

## 2021-08-15 NOTE — Progress Notes (Signed)
   Virtual Visit  Note Due to COVID-19 pandemic this visit was conducted virtually. This visit type was conducted due to national recommendations for restrictions regarding the COVID-19 Pandemic (e.g. social distancing, sheltering in place) in an effort to limit this patient's exposure and mitigate transmission in our community. All issues noted in this document were discussed and addressed.  A physical exam was not performed with this format.  I connected with Valerie [name] on 08/15/21 at 1257 by telephone and verified that I am speaking with the correct person using two identifiers. Valerie [name] is currently located at home and no one is currently with her during the visit. The provider, Gwenlyn Perking, FNP is located in their office at time of visit.  I discussed the limitations, risks, security and privacy concerns of performing an evaluation and management service by telephone and the availability of in person appointments. I also discussed with the patient that there may be a patient responsible charge related to this service. The patient expressed understanding and agreed to proceed.  CC: UTI symptoms  History and Present Illness:  HPI Valerie [name] reports UTI symptoms for a few days. She reports lower abdominal pain and lower back pain. She also reports seeing plants growing that are not really there. She states that this last time this happened she had a UTI. She denies dysuria, fev er, chills, vomiting, diarrhea, HA, chest pain, edema, shortness of breath, one sided weakness, or changes in speech. She feels weak all over and reports some intermittent dizziness. Denies falls. She dropped off urine this morning for testing.     ROS As per HPI.    Observations/Objective: Alert and oriented x 3. Able to speak in full sentences without difficulty.   Assessment and Plan: Valerie [name] was seen today for urinary tract infection.  Diagnoses and all orders for this visit:  Hallucinations Abdominal  pain, unspecified abdominal location Acute low back pain, unspecified back pain laterality, unspecified whether sciatica present UA negative for UTI.  Discussed with patient that hallucinations are concerning, especially with reported weakness and dizziness, and she should be evaluated in person. Discussed to call EMS for evaluation and transportation to ER for evaluation as needed. She verbalizes understanding and states that she will call.  -     Urinalysis, Routine w reflex microscopic  Follow Up Instructions: Call EMS for evaluation.     I discussed the assessment and treatment plan with the patient. The patient was provided an opportunity to ask questions and all were answered. The patient agreed with the plan and demonstrated an understanding of the instructions.   The patient was advised to call back or seek an in-person evaluation if the symptoms worsen or if the condition fails to improve as anticipated.  The above assessment and management plan was discussed with the patient. The patient verbalized understanding of and has agreed to the management plan. Patient is aware to call the clinic if symptoms persist or worsen. Patient is aware when to return to the clinic for a follow-up visit. Patient educated on when it is appropriate to go to the emergency department.   Time call ended:  1310  I provided 13 minutes of  non face-to-face time during this encounter.    Gwenlyn Perking, FNP

## 2021-08-18 ENCOUNTER — Other Ambulatory Visit: Payer: Self-pay | Admitting: Cardiovascular Disease

## 2021-08-18 ENCOUNTER — Telehealth: Payer: Self-pay

## 2021-08-18 DIAGNOSIS — E782 Mixed hyperlipidemia: Secondary | ICD-10-CM

## 2021-08-18 NOTE — Telephone Encounter (Signed)
   Telephone encounter was:  Successful.  08/18/2021 Name: Valerie [name] MRN: [id number] DOB: Mar 08, 1928  Valerie [name] is a 87 y.o. year old female who is a primary care patient of Chevis Pretty, [place] . The community resource team was consulted for assistance with [place] guide performed the following interventions:  Phone rung and disconnected. Unable to reach pt regarding food resources.  Follow Up Plan:  Care guide will follow up with patient by phone over the next few days.  [place] [facility] management  [place], [place]  Main Phone: [phone]  E-mail: [email or web address]  Website: [email or web address]

## 2021-08-27 DIAGNOSIS — I480 Paroxysmal atrial fibrillation: Secondary | ICD-10-CM

## 2021-08-27 DIAGNOSIS — I509 Heart failure, unspecified: Secondary | ICD-10-CM

## 2021-08-27 DIAGNOSIS — H409 Unspecified glaucoma: Secondary | ICD-10-CM

## 2021-09-16 ENCOUNTER — Telehealth: Payer: Medicare PPO

## 2021-09-16 ENCOUNTER — Telehealth: Payer: Self-pay

## 2021-09-16 NOTE — Telephone Encounter (Signed)
° °  Telephone encounter was:  Successful.  09/16/2021 Name: Valerie [name] MRN: [id number] DOB: [birth year]  Valerie [name] is a 87 y.o. year old female who is a primary care patient of Chevis Pretty, [place] . The community resource team was consulted for assistance with Food Insecurity.  Care guide performed the following interventions:  Left Voicemail on pt's cell: 2nd Attempt. 2nd attempt on home phone - Pt Declined Assistance/Resources. I advised reason of call and pt declined services & stated she does not feel like talking about food pantries/resources & disconnected the call on me.  After call was disconnected, I received an incoming call from daughter Valerie [name] (Pt's cell). I explained the reason of the call to daughter and daughter advised pt is well taken care of by daughter and family members. Valerie [name] advised at this time there are no future resources that are needed. Valerie [name] stated pt is well over income for certain resources and that is why family is so involved. Valerie [name] advised that all calls should be directed to her instead of pt as sometimes pt gets confused on what's going on. Valerie [name] advised she works at [employer] therefore, she has further resources there as well if needed. At this time, request will be closed as patient is not in need of resources as of today. Valerie [name] was well understanding that if anything may come up, to please reach back out to me if support/resources for anything else is needed.  Follow Up Plan:  No further follow up planned at this time. The patient has been provided with needed resources.  [facility] management  [place], [place]  Main Phone: [phone]   E-mail: [email or web address]  Website: [email or web address]

## 2021-09-17 DIAGNOSIS — D6869 Other thrombophilia: Secondary | ICD-10-CM | POA: Diagnosis not present

## 2021-09-17 DIAGNOSIS — J309 Allergic rhinitis, unspecified: Secondary | ICD-10-CM | POA: Diagnosis not present

## 2021-09-17 DIAGNOSIS — G8929 Other chronic pain: Secondary | ICD-10-CM | POA: Diagnosis not present

## 2021-09-17 DIAGNOSIS — I10 Essential (primary) hypertension: Secondary | ICD-10-CM | POA: Diagnosis not present

## 2021-09-17 DIAGNOSIS — E785 Hyperlipidemia, unspecified: Secondary | ICD-10-CM | POA: Diagnosis not present

## 2021-09-17 DIAGNOSIS — K219 Gastro-esophageal reflux disease without esophagitis: Secondary | ICD-10-CM | POA: Diagnosis not present

## 2021-09-17 DIAGNOSIS — I209 Angina pectoris, unspecified: Secondary | ICD-10-CM | POA: Diagnosis not present

## 2021-09-17 DIAGNOSIS — E0590 Thyrotoxicosis, unspecified without thyrotoxic crisis or storm: Secondary | ICD-10-CM | POA: Diagnosis not present

## 2021-09-17 DIAGNOSIS — I4891 Unspecified atrial fibrillation: Secondary | ICD-10-CM | POA: Diagnosis not present

## 2021-09-23 ENCOUNTER — Other Ambulatory Visit: Payer: Self-pay

## 2021-09-23 ENCOUNTER — Other Ambulatory Visit: Payer: Medicare PPO

## 2021-09-23 DIAGNOSIS — R300 Dysuria: Secondary | ICD-10-CM

## 2021-09-23 LAB — URINALYSIS, COMPLETE
Bilirubin, UA: NEGATIVE
Glucose, UA: NEGATIVE
Nitrite, UA: NEGATIVE
Specific Gravity, UA: 1.015 (ref 1.005–1.030)
Urobilinogen, Ur: 0.2 mg/dL (ref 0.2–1.0)
pH, UA: 5.5 (ref 5.0–7.5)

## 2021-09-23 LAB — MICROSCOPIC EXAMINATION: Renal Epithel, UA: NONE SEEN /hpf

## 2021-09-24 ENCOUNTER — Ambulatory Visit: Payer: Medicare PPO | Admitting: Nurse Practitioner

## 2021-09-26 ENCOUNTER — Telehealth: Payer: Self-pay | Admitting: Nurse Practitioner

## 2021-09-26 MED ORDER — CEPHALEXIN 500 MG PO CAPS: 500.0000 mg | ORAL_CAPSULE | Freq: Two times a day (BID) | ORAL | 0 refills | Status: DC

## 2021-09-26 NOTE — Telephone Encounter (Signed)
Keflex Prescription sent to pharmacy

## 2021-09-26 NOTE — Telephone Encounter (Signed)
Aware and verbalizes understanding.  

## 2021-09-27 LAB — URINE CULTURE

## 2021-09-30 ENCOUNTER — Telehealth: Payer: Self-pay

## 2021-09-30 ENCOUNTER — Other Ambulatory Visit: Payer: Self-pay

## 2021-09-30 ENCOUNTER — Ambulatory Visit (INDEPENDENT_AMBULATORY_CARE_PROVIDER_SITE_OTHER): Payer: Medicare PPO | Admitting: *Deleted

## 2021-09-30 DIAGNOSIS — N390 Urinary tract infection, site not specified: Secondary | ICD-10-CM | POA: Diagnosis not present

## 2021-09-30 DIAGNOSIS — N3000 Acute cystitis without hematuria: Secondary | ICD-10-CM

## 2021-09-30 MED ORDER — CEFTRIAXONE SODIUM 1 G IJ SOLR
1.0000 g | Freq: Once | INTRAMUSCULAR | Status: AC
  Administered 2021-09-30: 1 g via INTRAMUSCULAR

## 2021-09-30 NOTE — Telephone Encounter (Signed)
Patients son came in to the office asking what mother needs for urine infection. Advised son that I had spoke with mother and advised that she needed a rocephin shot for this infection. Son advised that he would bring mother in today or tomorrow to get the shot.

## 2021-09-30 NOTE — Progress Notes (Signed)
Tolerated well no adverse reactions

## 2021-10-08 DIAGNOSIS — H35351 Cystoid macular degeneration, right eye: Secondary | ICD-10-CM | POA: Diagnosis not present

## 2021-11-05 ENCOUNTER — Other Ambulatory Visit: Payer: Self-pay | Admitting: Nurse Practitioner

## 2021-11-05 ENCOUNTER — Other Ambulatory Visit: Payer: Self-pay | Admitting: Internal Medicine

## 2021-11-20 DIAGNOSIS — B351 Tinea unguium: Secondary | ICD-10-CM | POA: Diagnosis not present

## 2021-11-20 DIAGNOSIS — L84 Corns and callosities: Secondary | ICD-10-CM | POA: Diagnosis not present

## 2021-11-20 DIAGNOSIS — M79676 Pain in unspecified toe(s): Secondary | ICD-10-CM | POA: Diagnosis not present

## 2021-11-20 DIAGNOSIS — I70203 Unspecified atherosclerosis of native arteries of extremities, bilateral legs: Secondary | ICD-10-CM | POA: Diagnosis not present

## 2021-11-26 ENCOUNTER — Encounter: Payer: Self-pay | Admitting: Cardiovascular Disease

## 2021-11-28 ENCOUNTER — Ambulatory Visit (INDEPENDENT_AMBULATORY_CARE_PROVIDER_SITE_OTHER): Payer: Medicare PPO | Admitting: Family Medicine

## 2021-11-28 ENCOUNTER — Encounter: Payer: Self-pay | Admitting: Family Medicine

## 2021-11-28 DIAGNOSIS — R399 Unspecified symptoms and signs involving the genitourinary system: Secondary | ICD-10-CM | POA: Diagnosis not present

## 2021-11-28 LAB — URINALYSIS, COMPLETE
Bilirubin, UA: NEGATIVE
Glucose, UA: NEGATIVE
Nitrite, UA: NEGATIVE
Protein,UA: NEGATIVE
RBC, UA: NEGATIVE
Specific Gravity, UA: 1.01 (ref 1.005–1.030)
Urobilinogen, Ur: 0.2 mg/dL (ref 0.2–1.0)
pH, UA: 5.5 (ref 5.0–7.5)

## 2021-11-28 LAB — MICROSCOPIC EXAMINATION: RBC, Urine: NONE SEEN /hpf (ref 0–2)

## 2021-11-28 MED ORDER — CEPHALEXIN 500 MG PO CAPS: 500.0000 mg | ORAL_CAPSULE | Freq: Four times a day (QID) | ORAL | 0 refills | Status: DC

## 2021-11-28 NOTE — Progress Notes (Signed)
? ?Virtual Visit via telephone Note ? ?I connected with Valerie [name] on 11/28/21 at 1310 by telephone and verified that I am speaking with the correct person using two identifiers. Valerie [name] is currently located at home and patient are currently with her during visit. The provider, Fransisca Kaufmann Pharrah Rottman, MD is located in their office at time of visit. ? ?Call ended at 1316 ? ?I discussed the limitations, risks, security and privacy concerns of performing an evaluation and management service by telephone and the availability of in person appointments. I also discussed with the patient that there may be a patient responsible charge related to this service. The patient expressed understanding and agreed to proceed. ? ? ?History and Present Illness: ?Patient is calling in for urinary burning and lower abdominal pain.  She denies fevers or chills or flank pain. She started with symptoms about 1.5 weeks ago. She denies hematuria but does have dark urine.  ? ?1. UTI symptoms   ? ? ?Outpatient Encounter Medications as of 11/28/2021  ?Medication Sig  ? cephALEXin (KEFLEX) 500 MG capsule Take 1 capsule (500 mg total) by mouth 4 (four) times daily.  ? acetaminophen (TYLENOL) 325 MG tablet Take 650 mg by mouth every 6 (six) hours as needed for headache (pain).  ? amLODipine (NORVASC) 10 MG tablet Take 1 tablet (10 mg total) by mouth daily.  ? apixaban (ELIQUIS) 2.5 MG TABS tablet TAKE  (1)  TABLET TWICE A DAY.  ? carvedilol (COREG) 3.125 MG tablet TAKE  (1)  TABLET TWICE A DAY WITH MEALS (BREAKFAST AND SUPPER)  ? docusate sodium (COLACE) 100 MG capsule Take 1 capsule (100 mg total) by mouth 2 (two) times daily.  ? fluticasone (FLONASE) 50 MCG/ACT nasal spray Place 2 sprays into both nostrils as needed for allergies or rhinitis.  ? losartan (COZAAR) 100 MG tablet Take 1 tablet (100 mg total) by mouth daily.  ? methimazole (TAPAZOLE) 5 MG tablet TAKE 1 TABLET DAILY  ? nitroGLYCERIN (NITROSTAT) 0.4 MG SL tablet PLACE 1 TABLET UNDER  THE TONGUE AT ONSET OF CHEST PAIN EVERY 5 MINTUES UP TO 3 TIMES AS NEEDED  ? pantoprazole (PROTONIX) 40 MG tablet Take 1 tablet (40 mg total) by mouth daily.  ? polyethylene glycol (MIRALAX / GLYCOLAX) 17 g packet Take 17 g by mouth daily as needed for mild constipation.  ? polyvinyl alcohol (LIQUIFILM TEARS) 1.4 % ophthalmic solution Place 1 drop into both eyes as needed for dry eyes.  ? pravastatin (PRAVACHOL) 10 MG tablet TAKE 1 TABLET ONCE A DAY  ? Vitamin D, Ergocalciferol, (DRISDOL) 1.25 MG (50000 UNIT) CAPS capsule Take 1 capsule (50,000 Units total) by mouth every 7 (seven) days.  ? [DISCONTINUED] cephALEXin (KEFLEX) 500 MG capsule Take 1 capsule (500 mg total) by mouth 2 (two) times daily.  ? ?No facility-administered encounter medications on file as of 11/28/2021.  ? ? ?Review of Systems  ?Constitutional:  Negative for chills and fever.  ?Eyes:  Negative for visual disturbance.  ?Respiratory:  Negative for chest tightness and shortness of breath.   ?Cardiovascular:  Negative for chest pain and leg swelling.  ?Gastrointestinal:  Negative for abdominal pain.  ?Genitourinary:  Positive for dysuria, frequency and urgency. Negative for difficulty urinating, hematuria, vaginal bleeding, vaginal discharge and vaginal pain.  ?Musculoskeletal:  Negative for back pain and gait problem.  ?Skin:  Negative for rash.  ?Neurological:  Negative for light-headedness and headaches.  ?Psychiatric/Behavioral:  Negative for agitation and behavioral problems.   ?  All other systems reviewed and are negative. ? ?Observations/Objective: ?Patient sounds comfortable and in no acute distress ? ?Assessment and Plan: ?Problem List Items Addressed This Visit   ?None ?Visit Diagnoses   ? ? UTI symptoms    -  Primary  ? Relevant Medications  ? cephALEXin (KEFLEX) 500 MG capsule  ? Other Relevant Orders  ? Urinalysis, Complete  ? Urine Culture  ? ?  ?We will give Keflex because of symptoms and it is the weekend but would like her to come and  bring a urine sample today if she can before starting the antibiotic. ? ?Follow up plan: ?Return if symptoms worsen or fail to improve. ? ? ?  ?I discussed the assessment and treatment plan with the patient. The patient was provided an opportunity to ask questions and all were answered. The patient agreed with the plan and demonstrated an understanding of the instructions. ?  ?The patient was advised to call back or seek an in-person evaluation if the symptoms worsen or if the condition fails to improve as anticipated. ? ?The above assessment and management plan was discussed with the patient. The patient verbalized understanding of and has agreed to the management plan. Patient is aware to call the clinic if symptoms persist or worsen. Patient is aware when to return to the clinic for a follow-up visit. Patient educated on when it is appropriate to go to the emergency department.  ? ? ?I provided 6 minutes of non-face-to-face time during this encounter. ? ? ? ?Worthy Rancher, MD ?  ? ?

## 2021-11-30 ENCOUNTER — Encounter: Payer: Self-pay | Admitting: Cardiovascular Disease

## 2021-11-30 NOTE — Progress Notes (Signed)
Cardiology Office Note   Date:  12/01/2021   ID:  Valerie [name], DOB 11-19-1927, MRN [id number]  PCP:  Chevis Pretty, FNP  Cardiologist:  Dr. Acie Fredrickson    Chief Complaint  Patient presents with   Hyperlipidemia   Hypertension     Please note pt was followed by Dr. Mare Ferrari and with his retirement was seen once by Dr. Domenic Polite in [place], but her daughter who is followed by Dr. Acie Fredrickson would like her seen by Dr. Acie Fredrickson.  He has agreed to see pt.  Previous notes from [place]:  Valerie [name] is a 88 y.o. female who presents for HTN, bradycardia and dizziness.  PMH of HTN, HLD, anxiety, depression, GERD, CAD, glaucoma-underwent intervention approximately 3 months ago, lives alone but has family living next door who provide close supervision, [facility] ED on 06/30/16 with complaints of ongoing blurred vision for several weeks post procedure,? Intermittent dyspnea and arm pain. Along with chest pain that she states is arthritic.  She checks her blood pressures regularly at home and apparently were elevated in the 220/95 range despite compliance with medications. She apparently has not felt well for a couple of days, headache, poor oral intake and visual changes since surgery for which she has recently been seen by her ophthalmologist and has follow-up in 2 months. In the ED, CT head without acute abnormalities, initial blood pressure 220/77. Admitted for hypertensive urgency.  Her troponin was negative.  Hx of neg nuc study in 2015.     She had HR to 46 (though looking back she has been slow before)..  Meds adjusted and plan for outpt echo.  She had complained about SOB but was not clear today she does have at times.   Was with hypokalemia and follow up was with K+ 3.3.     Hx of carotid disease with last check in 09/2015 with 1-39% bil stenosis. To be seen by vascular in Dec.   On her last visit she complained of dizziness.  Her HR was low so I stopped cardizem and added amlodipine.   Also on 48 hour holter she was in SR 56 to 60 with occ episodes of sinus brady brief to 42 and occ PACs and PVCs.  Her echo with EF of 55 to 60% G2DD, mild Aortic regurg. LA mildly dialted. PA pk pressure 37 mmHg.   Today she feels better with less palpitations and less dizziness.  She does complain of hair loss.  This began with amlodipine.  She had hair loss with lisinopril as well.  No chest pain and no SOB.     November 26, 2016:  Valerie [name] is seen for the first time today.  Seen with [name] ( sister)  She is a transfer from Dr. Mare Ferrari. She has a history of hypertension and bradycardia.  BP has been up and down.  She takes it at home  BP has been as high as 185 and as low as 125 Goes out to eat once a week .   Does not pay attention to the salt in her food.   Tries to avoid fried foods.    Has rare episodes of palpitations . Perhaps twice a month. Worse when she is lying down  Had a 48 hr monitor  - revealed PACS and PVCs   August 30, 2017:  Seen with Son,  Claiborne Billings  Hx of HTN , blood pressure is high today.  She makes no effort to avoid salt.  No cardiac issues [place] , broke some ribs  Was in the hospital recently for HTN , ( felt some eye pressure )  Still eating some canned foods.   March 09, 2018:  Valerie [name] is seen today for follow-up of her hypertension. Has had some swimmyheadedness - has a cold. Has felt poorly for the past for 3 weeks.  Has had a URI for those 3 weeks No syncope   Dec. 11, 2019:   Has a cold today No syncope No dizziness HR is much better off the metoprolol   Dec. 1, 2020  Has felt well until yesterday  Has not felt well today  No fever, no cough. Has some indigestion  Belched lots yesterday  Has been found to have Graves disease since I last saw her .  Has been hyperthyroid and is on Mithimazole   Hospitalized in May, 2020 with acute diastolic congestive heart failure.  She was found to have atrial fibrillation at that time.  He was started on  Eliquis.  December 05, 2019  Valerie [name] is seen for follow up of her PAF and Graves disease .  Seen with her daughter, [name]. Has glaucoma - getting shots  Had a skin cancer on her face  Has some chronic stasis changes  December 06, 2020: Seen with daughter, [name] is seen today for follow up of her PAF,  Her echocardiogram from April, 2020 she has normal left ventricular systolic function.  She has grade 1 diastolic dysfunction. She has moderate to severe aortic insufficiency. Given her age and frail health, we have treated her conservatively and do not anticipate any surgery for her AI   No cp or dyspnea  Has severe glaucoma ,   Lives up in [place]  No eating as much as she used to  [employer] is 109 lbs  Has mild bilateral carotid artery disease   December 01, 2021 [name] is seen today with her daughter, Valerie [name] Has PAF, [place] - severe AI Given her age and fragility, we have made the decision to treat her medically and not anticipate surgery for her AI  Broke her Right hip,  had partial hip replacement  Cannot see very well  Wt is 102 lbs  Past Medical History:  Diagnosis Date   Anxiety    Arthritis    Broken ribs    Carotid artery disease ([place])    Cataract    Collar bone fracture    Right   Depression    Diverticulosis of colon with hemorrhage    Duodenitis    Esophageal reflux    Esophageal stricture    Essential hypertension    Glaucoma    Hiatal hernia    History of kidney stones     x1    Hyperlipidemia    IBS (irritable bowel syndrome)    Palpitations    UTI (urinary tract infection) March 2015    Past Surgical History:  Procedure Laterality Date   CHOLECYSTECTOMY     COLONOSCOPY     DILATION AND CURETTAGE OF UTERUS     EYE SURGERY Bilateral    total of 5   HIP ARTHROPLASTY Right 03/16/2021   Procedure: POSTERIOR ARTHROPLASTY BIPOLAR HIP (HEMIARTHROPLASTY);  Surgeon: [name], MD;  Location: [place];  Service: Orthopedics;   Laterality: Right;   LASER PHOTO ABLATION Right 10/20/2016   Procedure: LASER PHOTO ABLATION;  Surgeon: Hayden Pedro, MD;  Location: [facility];  Service: Ophthalmology;  Laterality:  Right;  Headscope laser   LEFT ROTATOR CUFF REPAIR X2 Left    PARS PLANA VITRECTOMY  10/20/2016   WITH 25G REMOVAL/SUTURE SECONDARY INTRAOCULAR LENS, GAS FLUID EXCHANGE    PARS PLANA VITRECTOMY Right 10/20/2016   Procedure: PARS PLANA VITRECTOMY WITH 25G REMOVAL/SUTURE SECONDARY INTRAOCULAR LENS, GAS FLUID EXCHANGE;  Surgeon: Hayden Pedro, MD;  Location: [facility];  Service: Ophthalmology;  Laterality: Right;   RIGHT ROTATOR CUFF REPAIR X1 Right    UPPER GASTROINTESTINAL ENDOSCOPY     with dilation     Current Outpatient Medications  Medication Sig Dispense Refill   acetaminophen (TYLENOL) 325 MG tablet Take 650 mg by mouth every 6 (six) hours as needed for headache (pain).     amLODipine (NORVASC) 10 MG tablet Take 1 tablet (10 mg total) by mouth daily. 90 tablet 1   apixaban (ELIQUIS) 2.5 MG TABS tablet TAKE  (1)  TABLET TWICE A DAY. 180 tablet 1   carvedilol (COREG) 3.125 MG tablet TAKE  (1)  TABLET TWICE A DAY WITH MEALS (BREAKFAST AND SUPPER) 180 tablet 1   cephALEXin (KEFLEX) 500 MG capsule Take 1 capsule (500 mg total) by mouth 4 (four) times daily. 28 capsule 0   docusate sodium (COLACE) 100 MG capsule Take 1 capsule (100 mg total) by mouth 2 (two) times daily. 10 capsule 0   fluticasone (FLONASE) 50 MCG/ACT nasal spray Place 2 sprays into both nostrils as needed for allergies or rhinitis. 16 g 2   losartan (COZAAR) 100 MG tablet Take 1 tablet (100 mg total) by mouth daily. 90 tablet 1   methimazole (TAPAZOLE) 5 MG tablet TAKE 1 TABLET DAILY 90 tablet 0   nitroGLYCERIN (NITROSTAT) 0.4 MG SL tablet PLACE 1 TABLET UNDER THE TONGUE AT ONSET OF CHEST PAIN EVERY 5 MINTUES UP TO 3 TIMES AS NEEDED 25 tablet 0   pantoprazole (PROTONIX) 40 MG tablet Take 1 tablet (40 mg total) by mouth daily. 90 tablet 1    polyethylene glycol (MIRALAX / GLYCOLAX) 17 g packet Take 17 g by mouth daily as needed for mild constipation. 14 each 0   polyvinyl alcohol (LIQUIFILM TEARS) 1.4 % ophthalmic solution Place 1 drop into both eyes as needed for dry eyes.     pravastatin (PRAVACHOL) 10 MG tablet TAKE 1 TABLET ONCE A DAY 90 tablet 1   Vitamin D, Ergocalciferol, (DRISDOL) 1.25 MG (50000 UNIT) CAPS capsule Take 1 capsule (50,000 Units total) by mouth every 7 (seven) days. 12 capsule 0   No current facility-administered medications for this visit.    Allergies:   Aricept [donepezil hcl], Lisinopril, Sulfa antibiotics, and Sulfasalazine    Social History:  The patient  reports that she has never smoked. She has never used smokeless tobacco. She reports that she does not drink alcohol and does not use drugs.   Family History:  The patient's family history includes Bladder Cancer in her brother; Colon cancer (age of onset: 37) in her father; Kidney cancer in her brother; Leukemia in her mother; Stroke in her sister.    Review of systems:    As per current history.  Otherwise the review of systems is negative.  Physical Exam: Blood pressure 140/64, pulse 67, height '5\' 4"'$  (1.626 m), weight 102 lb (46.3 kg), SpO2 97 %.  GEN:  elderly female,  NAD  in no acute distress. thon HEENT: Normal NECK: No JVD; No carotid bruits LYMPHATICS: No lymphadenopathy CARDIAC: RRR , soft systolic murmur  RESPIRATORY:  Clear to auscultation  without rales, wheezing or rhonchi  ABDOMEN: Soft, non-tender, non-distended MUSCULOSKELETAL:  No edema; No deformity  SKIN: Warm and dry NEUROLOGIC:  Alert and oriented x 3    EKG:    Normal sinus rhythm at 67.  No ST or T wave changes.  Recent Labs: 07/28/2021: ALT 12; BUN 24; Creatinine, Ser 1.00; Hemoglobin 13.1; Platelets 324; Potassium 4.7; Sodium 141   Lipid Panel    Component Value Date/Time   CHOL 189 07/28/2021 1034   TRIG 38 07/28/2021 1034   HDL 56 07/28/2021 1034    CHOLHDL 3.4 07/28/2021 1034   CHOLHDL 4 12/05/2014 0946   VLDL 26.0 12/05/2014 0946   LDLCALC 126 (H) 07/28/2021 1034     Other studies Reviewed: Additional studies/ records that were reviewed today include:  Echo 07/31/16 Study Conclusions   - Left ventricle: The cavity size was normal. There was mild focal   basal hypertrophy of the septum. Systolic function was normal.   The estimated ejection fraction was in the range of 55% to 60%.   Wall motion was normal; there were no regional wall motion   abnormalities. Features are consistent with a pseudonormal left   ventricular filling pattern, with concomitant abnormal relaxation   and increased filling pressure (grade 2 diastolic dysfunction). - Aortic valve: There was mild regurgitation. - Mitral valve: Calcified annulus. There was mild regurgitation. - Left atrium: The atrium was mildly dilated. - Pulmonary arteries: Systolic pressure was mildly increased. PA   peak pressure: 37 mm Hg (S).    ASSESSMENT AND PLAN:  1.  HTN:      BP is ok .  Cont current meds.   2.  Paroxysmal atrial fibrillation:    remains in NSR currently   3. Aortic Insufficiency :    Stable   4. Hyperlipidemia:  had labs drawn today    4. Carotid disease:       5.  Generalized fatigue.     Current medicines are reviewed with the patient today.  The patient Has no concerns regarding medicines.  The following changes have been made:  See above Labs/ tests ordered today include:see above    Signed, Mertie Moores, MD  12/01/2021 2:51 PM    [facility], [address] [place], [place] Phone: [phone]; Fax: [phone]

## 2021-12-01 ENCOUNTER — Ambulatory Visit: Payer: Medicare PPO | Admitting: Internal Medicine

## 2021-12-01 ENCOUNTER — Other Ambulatory Visit: Payer: Self-pay

## 2021-12-01 ENCOUNTER — Other Ambulatory Visit: Payer: Self-pay | Admitting: Cardiovascular Disease

## 2021-12-01 ENCOUNTER — Other Ambulatory Visit: Payer: Medicare PPO | Admitting: *Deleted

## 2021-12-01 ENCOUNTER — Encounter: Payer: Self-pay | Admitting: Cardiovascular Disease

## 2021-12-01 ENCOUNTER — Ambulatory Visit: Payer: Medicare PPO | Admitting: Cardiovascular Disease

## 2021-12-01 ENCOUNTER — Telehealth: Payer: Self-pay | Admitting: Nurse Practitioner

## 2021-12-01 VITALS — BP 140/64 | HR 67 | Ht 64.0 in | Wt 102.0 lb

## 2021-12-01 VITALS — BP 134/76 | HR 85 | Ht 64.0 in | Wt 102.2 lb

## 2021-12-01 DIAGNOSIS — I10 Essential (primary) hypertension: Secondary | ICD-10-CM

## 2021-12-01 DIAGNOSIS — E0590 Thyrotoxicosis, unspecified without thyrotoxic crisis or storm: Secondary | ICD-10-CM | POA: Diagnosis not present

## 2021-12-01 DIAGNOSIS — E782 Mixed hyperlipidemia: Secondary | ICD-10-CM | POA: Diagnosis not present

## 2021-12-01 DIAGNOSIS — E0500 Thyrotoxicosis with diffuse goiter without thyrotoxic crisis or storm: Secondary | ICD-10-CM | POA: Diagnosis not present

## 2021-12-01 LAB — HEPATIC FUNCTION PANEL
ALT: 10 IU/L (ref 0–32)
AST: 14 IU/L (ref 0–40)
Albumin: 4.6 g/dL (ref 3.5–4.6)
Alkaline Phosphatase: 73 IU/L (ref 44–121)
Bilirubin Total: 0.5 mg/dL (ref 0.0–1.2)
Bilirubin, Direct: 0.13 mg/dL (ref 0.00–0.40)
Total Protein: 7.1 g/dL (ref 6.0–8.5)

## 2021-12-01 LAB — LIPID PANEL
Chol/HDL Ratio: 2.6 ratio (ref 0.0–4.4)
Cholesterol, Total: 193 mg/dL (ref 100–199)
HDL: 74 mg/dL (ref >39)
LDL Chol Calc (NIH): 108 mg/dL — ABNORMAL HIGH (ref 0–99)
Triglycerides: 56 mg/dL (ref 0–149)
VLDL Cholesterol Cal: 11 mg/dL (ref 5–40)

## 2021-12-01 LAB — BASIC METABOLIC PANEL
BUN/Creatinine Ratio: 28 (ref 12–28)
BUN: 28 mg/dL (ref 10–36)
CO2: 25 mmol/L (ref 20–29)
Calcium: 10.3 mg/dL (ref 8.7–10.3)
Chloride: 105 mmol/L (ref 96–106)
Creatinine, Ser: 1 mg/dL (ref 0.57–1.00)
Glucose: 90 mg/dL (ref 70–99)
Potassium: 4.3 mmol/L (ref 3.5–5.2)
Sodium: 144 mmol/L (ref 134–144)
eGFR: 53 mL/min/{1.73_m2} — ABNORMAL LOW (ref >59)

## 2021-12-01 LAB — TSH: TSH: 1.83 u[IU]/mL (ref 0.35–5.50)

## 2021-12-01 LAB — T4, FREE: Free T4: 0.86 ng/dL (ref 0.60–1.60)

## 2021-12-01 NOTE — Patient Instructions (Signed)
Medication Instructions:  ?Your physician recommends that you continue on your current medications as directed. Please refer to the Current Medication list given to you today. ? ?*If you need a refill on your cardiac medications before your next appointment, please call your pharmacy* ? ? ?Lab Work: ?NONE ?If you have labs (blood work) drawn today and your tests are completely normal, you will receive your results only by: ?MyChart Message (if you have MyChart) OR ?A paper copy in the mail ?If you have any lab test that is abnormal or we need to change your treatment, we will call you to review the results. ? ? ?Testing/Procedures: ?NONE ? ? ?Follow-Up: ?At [facility], you and your health needs are our priority.  As part of our continuing mission to provide you with exceptional heart care, we have created designated Provider Care Teams.  These Care Teams include your primary Cardiologist (physician) and Advanced Practice Providers (APPs -  Physician Assistants and Nurse Practitioners) who all work together to provide you with the care you need, when you need it. ? ?Your next appointment:   ?1 year(s) ? ?The format for your next appointment:   ?In Person ? ?Provider:   ?Mertie Moores, MD  or Robbie Lis, PA-C, Christen Bame, NP, or Richardson Dopp, PA-C ?

## 2021-12-01 NOTE — Progress Notes (Signed)
? ? ?Name: Valerie [name]  ?MRN/ DOB: [id number], [birth year]    ?Age/ Sex: 88 y.o., female   ? ? ?PCP: Chevis Pretty, FNP   ?Reason for Endocrinology Evaluation: hyperthyroidism  ?   ?Initial Endocrinology Clinic Visit: 01/24/2019  ? ? ?PATIENT IDENTIFIER: [name] is a 88 y.o., female with a past medical history of HTN, Hyperlipidemia, CHF/CAD and carotid artery stenosis . She has followed with [facility] since 01/24/2019 for consultative assistance with management of her hyperthyroidism.  ? ?HISTORICAL SUMMARY: The patient was first diagnosed with hyperthyroidism in 12/2018 with a TSH of 0.179 uIU/mL with an elevated FT4 and T3 during evaluation for near syncope in the ED. She was started on Atenolol and Methimazole at the time.  ? ?She did present to ED with SoB in 01/2019 and was diagnosed with new onset A.Fib  ? ? ?SUBJECTIVE:  ? ? ?Today (12/01/2021):  Valerie [name] is here for a follow up on hyperthyroidism secondary to graves' disease. She is accompanied by a family member  ? ?Weight has been fluctuating  ?She has noted worsening vision due to glaucoma  ?Has occasional palpitations  ?Has occasional diarrhea  ?Has occasional tremors  ?No local neck swelling  ? ?She had a fall with a hip fracture in 02/2021 ? ?Methimazole 5 mg , half a tablet daily  ? ?HISTORY:  ?Past Medical History:  ?Past Medical History:  ?Diagnosis Date  ? Anxiety   ? Arthritis   ? Broken ribs   ? Carotid artery disease ([place])   ? Cataract   ? Collar bone fracture   ? Right  ? Depression   ? Diverticulosis of colon with hemorrhage   ? Duodenitis   ? Esophageal reflux   ? Esophageal stricture   ? Essential hypertension   ? Glaucoma   ? Hiatal hernia   ? History of kidney stones   ?  x1   ? Hyperlipidemia   ? IBS (irritable bowel syndrome)   ? Palpitations   ? UTI (urinary tract infection) March 2015  ? ?Past Surgical History:  ?Past Surgical History:  ?Procedure Laterality Date  ? CHOLECYSTECTOMY    ? COLONOSCOPY    ?  DILATION AND CURETTAGE OF UTERUS    ? EYE SURGERY Bilateral   ? total of 5  ? HIP ARTHROPLASTY Right 03/16/2021  ? Procedure: POSTERIOR ARTHROPLASTY BIPOLAR HIP (HEMIARTHROPLASTY);  Surgeon: [name], MD;  Location: [facility];  Service: Orthopedics;  Laterality: Right;  ? LASER PHOTO ABLATION Right 10/20/2016  ? Procedure: LASER PHOTO ABLATION;  Surgeon: Hayden Pedro, MD;  Location: [facility];  Service: Ophthalmology;  Laterality: Right;  Headscope laser  ? LEFT ROTATOR CUFF REPAIR X2 Left   ? PARS PLANA VITRECTOMY  10/20/2016  ? WITH 25G REMOVAL/SUTURE SECONDARY INTRAOCULAR LENS, GAS FLUID EXCHANGE   ? PARS PLANA VITRECTOMY Right 10/20/2016  ? Procedure: PARS PLANA VITRECTOMY WITH 25G REMOVAL/SUTURE SECONDARY INTRAOCULAR LENS, GAS FLUID EXCHANGE;  Surgeon: Hayden Pedro, MD;  Location: [facility];  Service: Ophthalmology;  Laterality: Right;  ? RIGHT ROTATOR CUFF REPAIR X1 Right   ? UPPER GASTROINTESTINAL ENDOSCOPY    ? with dilation  ? ?Social History:  reports that she has never smoked. She has never used smokeless tobacco. She reports that she does not drink alcohol and does not use drugs. ?Family History:  ?Family History  ?Problem Relation Age of Onset  ? Leukemia Mother   ? Colon cancer Father 35  ?  Kidney cancer Brother   ? Bladder Cancer Brother   ? Stroke Sister   ? Esophageal cancer Neg Hx   ? Stomach cancer Neg Hx   ? ? ? ?HOME MEDICATIONS: ?Allergies as of 12/01/2021   ? ?   Reactions  ? Aricept [donepezil Hcl] Other (See Comments)  ? Nightmares, near syncope, weak, decreased appetite.  ? Lisinopril Other (See Comments)  ? Hair loss  ? Sulfa Antibiotics Nausea Only  ? Sulfasalazine Nausea Only  ? ?  ? ?  ?Medication List  ?  ? ?  ? Accurate as of December 01, 2021 12:43 PM. If you have any questions, ask your nurse or doctor.  ?  ?  ? ?  ? ?acetaminophen 325 MG tablet ?Commonly known as: TYLENOL ?Take 650 mg by mouth every 6 (six) hours as needed for headache (pain). ?  ?amLODipine 10 MG tablet ?Commonly known as:  NORVASC ?Take 1 tablet (10 mg total) by mouth daily. ?  ?apixaban 2.5 MG Tabs tablet ?Commonly known as: Eliquis ?TAKE  (1)  TABLET TWICE A DAY. ?  ?carvedilol 3.125 MG tablet ?Commonly known as: COREG ?TAKE  (1)  TABLET TWICE A DAY WITH MEALS (BREAKFAST AND SUPPER) ?  ?cephALEXin 500 MG capsule ?Commonly known as: KEFLEX ?Take 1 capsule (500 mg total) by mouth 4 (four) times daily. ?  ?docusate sodium 100 MG capsule ?Commonly known as: COLACE ?Take 1 capsule (100 mg total) by mouth 2 (two) times daily. ?  ?fluticasone 50 MCG/ACT nasal spray ?Commonly known as: FLONASE ?Place 2 sprays into both nostrils as needed for allergies or rhinitis. ?  ?losartan 100 MG tablet ?Commonly known as: COZAAR ?Take 1 tablet (100 mg total) by mouth daily. ?  ?methimazole 5 MG tablet ?Commonly known as: TAPAZOLE ?TAKE 1 TABLET DAILY ?  ?nitroGLYCERIN 0.4 MG SL tablet ?Commonly known as: NITROSTAT ?PLACE 1 TABLET UNDER THE TONGUE AT ONSET OF CHEST PAIN EVERY 5 MINTUES UP TO 3 TIMES AS NEEDED ?  ?pantoprazole 40 MG tablet ?Commonly known as: PROTONIX ?Take 1 tablet (40 mg total) by mouth daily. ?  ?polyethylene glycol 17 g packet ?Commonly known as: MIRALAX / GLYCOLAX ?Take 17 g by mouth daily as needed for mild constipation. ?  ?polyvinyl alcohol 1.4 % ophthalmic solution ?Commonly known as: LIQUIFILM TEARS ?Place 1 drop into both eyes as needed for dry eyes. ?  ?pravastatin 10 MG tablet ?Commonly known as: PRAVACHOL ?TAKE 1 TABLET ONCE A DAY ?  ?Vitamin D (Ergocalciferol) 1.25 MG (50000 UNIT) Caps capsule ?Commonly known as: DRISDOL ?Take 1 capsule (50,000 Units total) by mouth every 7 (seven) days. ?  ? ?  ? ? ? ? ?DATA REVIEWED: ? Latest Reference Range & Units 12/01/21 13:23  ?TSH 0.35 - 5.50 uIU/mL 1.83  ?T4,Free(Direct) 0.60 - 1.60 ng/dL 0.86  ? ? ? Latest Reference Range & Units 07/28/21 10:34  ?Sodium 134 - 144 mmol/L 141  ?Potassium 3.5 - 5.2 mmol/L 4.7  ?Chloride 96 - 106 mmol/L 103  ?CO2 20 - 29 mmol/L 22  ?Glucose 70 - 99  mg/dL 86  ?BUN 10 - 36 mg/dL 24  ?Creatinine 0.57 - 1.00 mg/dL 1.00  ?Calcium 8.7 - 10.3 mg/dL 10.0  ?BUN/Creatinine Ratio 12 - 28  24  ?eGFR >59 mL/min/1.73 53 (L)  ?Alkaline Phosphatase 44 - 121 IU/L 85  ?Albumin 3.5 - 4.6 g/dL 4.3  ?Albumin/Globulin Ratio 1.2 - 2.2  1.5  ?AST 0 - 40 IU/L 19  ?ALT 0 -  32 IU/L 12  ?Total Protein 6.0 - 8.5 g/dL 7.1  ?Total Bilirubin 0.0 - 1.2 mg/dL 0.5  ? ? ?Results for THENA, DEVORA (MRN [id number]) as of 03/22/2019 08:11 ? Ref. Range 02/06/2019 15:57  ?Thyrotropin Receptor Ab Latest Ref Range: 0.00 - 1.75 IU/L 3.12 (H)  ? ? ?ASSESSMENT / PLAN / RECOMMENDATIONS:  ? ?Hyperthyroidism Secondary to Graves' Disease: ? ?- Pt is clinically euthyroid ?- No local neck symptoms.  ?- No side effects to methimazole ?- Repeat TFT's are normal, no change will be made  ? ? ?Medications  ?Continue Methimazole 2.5 mg daily  ? ? ?2. Graves' Disease:  ? ?- No extra-thyroidal manifestations of graves' disease.  ? ? ? ?F/u in 1 yr  ? ? ?Signed electronically by: ?Abby Nena Jordan, MD ? ?[facility]  ?[facility] ?[address] ?[place], [place] [address] ?Phone: [phone] ?FAX: [phone]  ? ? ? ? ?CC: ?Chevis Pretty, FNP ?[place] ?[place] [address] ?Phone: [phone]  ?Fax: [phone] ? ? ?Return to Endocrinology clinic as below: ?Future Appointments  ?Date Time Provider [place]  ?12/01/2021  1:20 PM Karishma Unrein, Melanie Crazier, MD [facility] None  ?12/01/2021  2:20 PM Nahser, Wonda Cheng, MD [facility]  ?01/23/2022 10:30 AM Chevis Pretty, FNP [facility] None  ?  ? ?

## 2021-12-02 ENCOUNTER — Encounter: Payer: Self-pay | Admitting: Internal Medicine

## 2021-12-02 MED ORDER — METHIMAZOLE 5 MG PO TABS: 5.0000 mg | ORAL_TABLET | Freq: Every day | ORAL | 3 refills | Status: DC

## 2021-12-02 NOTE — Telephone Encounter (Signed)
Daughter aware.

## 2021-12-02 NOTE — Telephone Encounter (Signed)
Please advise on culture.  ?Treating physician is OFF  ?Will send to PCP to advise  ?

## 2021-12-02 NOTE — Telephone Encounter (Signed)
Report is only preliminary final results noit available yet ?

## 2021-12-03 LAB — URINE CULTURE

## 2021-12-04 ENCOUNTER — Other Ambulatory Visit: Payer: Self-pay | Admitting: Family Medicine

## 2021-12-04 DIAGNOSIS — N3000 Acute cystitis without hematuria: Secondary | ICD-10-CM

## 2021-12-04 MED ORDER — CIPROFLOXACIN HCL 500 MG PO TABS: 500.0000 mg | ORAL_TABLET | Freq: Two times a day (BID) | ORAL | 0 refills | Status: AC

## 2021-12-04 NOTE — Progress Notes (Signed)
Patient's urine culture grew bacteria that was resistant to some but sensitive to Cipro, sent Cipro for her ?

## 2021-12-04 NOTE — Progress Notes (Signed)
Pt's daughter aware of urine culture results and that Cipro was sent in. ?

## 2021-12-11 ENCOUNTER — Encounter: Payer: Self-pay | Admitting: Family

## 2021-12-11 ENCOUNTER — Ambulatory Visit (INDEPENDENT_AMBULATORY_CARE_PROVIDER_SITE_OTHER): Payer: Medicare PPO | Admitting: Family

## 2021-12-11 DIAGNOSIS — B3731 Acute candidiasis of vulva and vagina: Secondary | ICD-10-CM | POA: Diagnosis not present

## 2021-12-11 DIAGNOSIS — R300 Dysuria: Secondary | ICD-10-CM | POA: Diagnosis not present

## 2021-12-11 LAB — URINALYSIS, COMPLETE
Bilirubin, UA: NEGATIVE
Glucose, UA: NEGATIVE
Ketones, UA: NEGATIVE
Nitrite, UA: NEGATIVE
Protein,UA: NEGATIVE
RBC, UA: NEGATIVE
Specific Gravity, UA: 1.01 (ref 1.005–1.030)
Urobilinogen, Ur: 0.2 mg/dL (ref 0.2–1.0)
pH, UA: 5.5 (ref 5.0–7.5)

## 2021-12-11 LAB — MICROSCOPIC EXAMINATION

## 2021-12-11 MED ORDER — TERCONAZOLE 0.4 % VA CREA: 1.0000 | TOPICAL_CREAM | Freq: Every day | VAGINAL | 0 refills | Status: DC

## 2021-12-11 MED ORDER — FLUCONAZOLE 150 MG PO TABS: 150.0000 mg | ORAL_TABLET | ORAL | 0 refills | Status: DC | PRN

## 2021-12-11 NOTE — Progress Notes (Signed)
? ?Virtual Visit  Note ?Due to COVID-19 pandemic this visit was conducted virtually. This visit type was conducted due to national recommendations for restrictions regarding the COVID-19 Pandemic (e.g. social distancing, sheltering in place) in an effort to limit this patient's exposure and mitigate transmission in our community. All issues noted in this document were discussed and addressed.  A physical exam was not performed with this format. ? ?I connected with Valerie [name] on 12/11/21 at 10:30 AM  by telephone and verified that I am speaking with the correct person using two identifiers. TANAZIA [name] is currently located at home and no one is currently with her during visit. The provider, Evelina Dun, FNP is located in their office at time of visit. ? ?I discussed the limitations, risks, security and privacy concerns of performing an evaluation and management service by telephone and the availability of in person appointments. I also discussed with the patient that there may be a patient responsible charge related to this service. The patient expressed understanding and agreed to proceed. ? ?[name],you are scheduled for a virtual visit with your provider today.   ? ?Just as we do with appointments in the office, we must obtain your consent to participate.  Your consent will be active for this visit and any virtual visit you may have with one of our providers in the next 365 days.   ? ?If you have a MyChart account, I can also send a copy of this consent to you electronically.  All virtual visits are billed to your insurance company just like a traditional visit in the office.  As this is a virtual visit, video technology does not allow for your provider to perform a traditional examination.  This may limit your provider's ability to fully assess your condition.  If your provider identifies any concerns that need to be evaluated in person or the need to arrange testing such as labs, EKG, etc, we will make  arrangements to do so.   ? ?Although advances in technology are sophisticated, we cannot ensure that it will always work on either your end or our end.  If the connection with a video visit is poor, we may have to switch to a telephone visit.  With either a video or telephone visit, we are not always able to ensure that we have a secure connection.   I need to obtain your verbal consent now.   Are you willing to proceed with your visit today?  ? ?Valerie [name] has provided verbal consent on 12/11/2021 for a virtual visit (video or telephone). ? ? ?Evelina Dun, FNP ?12/11/2021  10:33 AM ? ? ?History and Present Illness: ? ?Pt calls the office today with vaginal itching and burning. She has a UTI and was given Keflex then changed to cipro based on her Urine culture. She completed the cipro.  ?Vaginal Itching ?The patient's primary symptoms include genital itching. The patient's pertinent negatives include no genital odor, vaginal bleeding or vaginal discharge. This is a new problem. The current episode started in the past 7 days. Associated symptoms include dysuria. Pertinent negatives include no constipation, diarrhea, headaches, hematuria, painful intercourse or sore throat. She has tried nothing for the symptoms. The treatment provided no relief.  ? ? ? ?Review of Systems  ?HENT:  Negative for sore throat.   ?Gastrointestinal:  Negative for constipation and diarrhea.  ?Genitourinary:  Positive for dysuria. Negative for hematuria and vaginal discharge.  ?Neurological:  Negative for headaches.  ? ? ?  Observations/Objective: ?No SOB or distress noted  ? ?Assessment and Plan: ?1. Vagina, candidiasis ?Start diflucan and terazol  ?Avoid scratching  ?Probiotic  ?- fluconazole (DIFLUCAN) 150 MG tablet; Take 1 tablet (150 mg total) by mouth every three (3) days as needed.  Dispense: 3 tablet; Refill: 0 ?- terconazole (TERAZOL 7) 0.4 % vaginal cream; Place 1 applicator vaginally at bedtime.  Dispense: 45 g; Refill: 0 ? ?2.  Dysuria ?- Urinalysis, Complete ?- Urine Culture ? ? ?  ?I discussed the assessment and treatment plan with the patient. The patient was provided an opportunity to ask questions and all were answered. The patient agreed with the plan and demonstrated an understanding of the instructions. ?  ?The patient was advised to call back or seek an in-person evaluation if the symptoms worsen or if the condition fails to improve as anticipated. ? ?The above assessment and management plan was discussed with the patient. The patient verbalized understanding of and has agreed to the management plan. Patient is aware to call the clinic if symptoms persist or worsen. Patient is aware when to return to the clinic for a follow-up visit. Patient educated on when it is appropriate to go to the emergency department.  ? ?Time call ended:  10:41 AM  ? ?I provided 11 minutes of  non face-to-face time during this encounter. ? ? ? ?Evelina Dun, FNP ? ? ?

## 2021-12-11 NOTE — Patient Instructions (Signed)
Vaginal Yeast Infection, Adult ?Vaginal yeast infection is a condition that causes vaginal discharge as well as soreness, swelling, and redness (inflammation) of the vagina. This is a common condition. Some women get this infection frequently. ?What are the causes? ?This condition is caused by a change in the normal balance of the yeast (Candida) and normal bacteria that live in the vagina. This change causes an overgrowth of yeast, which causes the inflammation. ?What increases the risk? ?The condition is more likely to develop in women who: ?Take antibiotic medicines. ?Have diabetes. ?Take birth control pills. ?Are pregnant. ?Douche often. ?Have a weak body defense system (immune system). ?Have been taking steroid medicines for a long time. ?Frequently wear tight clothing. ?What are the signs or symptoms? ?Symptoms of this condition include: ?White, thick, creamy vaginal discharge. ?Swelling, itching, redness, and irritation of the vagina. The lips of the vagina (labia) may be affected as well. ?Pain or a burning feeling while urinating. ?Pain during sex. ?How is this diagnosed? ?This condition is diagnosed based on: ?Your medical history. ?A physical exam. ?A pelvic exam. Your health care provider will examine a sample of your vaginal discharge under a microscope. Your health care provider may send this sample for testing to confirm the diagnosis. ?How is this treated? ?This condition is treated with medicine. Medicines may be over-the-counter or prescription. You may be told to use one or more of the following: ?Medicine that is taken by mouth (orally). ?Medicine that is applied as a cream (topically). ?Medicine that is inserted directly into the vagina (suppository). ?Follow these instructions at home: ?Take or apply over-the-counter and prescription medicines only as told by your health care provider. ?Do not use tampons until your health care provider approves. ?Do not have sex until your infection has  cleared. Sex can prolong or worsen your symptoms of infection. Ask your health care provider when it is safe to resume sexual activity. ?Keep all follow-up visits. This is important. ?How is this prevented? ? ?Do not wear tight clothes, such as pantyhose or tight pants. ?Wear breathable cotton underwear. ?Do not use douches, perfumed soap, creams, or powders. ?Wipe from front to back after using the toilet. ?If you have diabetes, keep your blood sugar levels under control. ?Ask your health care provider for other ways to prevent yeast infections. ?Contact a health care provider if: ?You have a fever. ?Your symptoms go away and then return. ?Your symptoms do not get better with treatment. ?Your symptoms get worse. ?You have new symptoms. ?You develop blisters in or around your vagina. ?You have blood coming from your vagina and it is not your menstrual period. ?You develop pain in your abdomen. ?Summary ?Vaginal yeast infection is a condition that causes discharge as well as soreness, swelling, and redness (inflammation) of the vagina. ?This condition is treated with medicine. Medicines may be over-the-counter or prescription. ?Take or apply over-the-counter and prescription medicines only as told by your health care provider. ?Do not douche. Resume sexual activity or use of tampons as instructed by your health care provider. ?Contact a health care provider if your symptoms do not get better with treatment or your symptoms go away and then return. ?This information is not intended to replace advice given to you by your health care provider. Make sure you discuss any questions you have with your health care provider. ?Document Revised: 12/02/2020 Document Reviewed: 12/02/2020 ?Elsevier Patient Education ? [facility]. ? ?

## 2021-12-12 ENCOUNTER — Telehealth: Payer: Self-pay | Admitting: Nurse Practitioner

## 2021-12-12 ENCOUNTER — Other Ambulatory Visit: Payer: Self-pay | Admitting: Family

## 2021-12-12 MED ORDER — CIPROFLOXACIN HCL 500 MG PO TABS: 500.0000 mg | ORAL_TABLET | Freq: Two times a day (BID) | ORAL | 0 refills | Status: DC

## 2021-12-12 NOTE — Telephone Encounter (Signed)
Valerie [name] ?

## 2021-12-12 NOTE — Telephone Encounter (Signed)
Spoke with patients son and explained that the 2 medications pt was prescribed yesterday was an antifungal pill and a cream and the medication that was prescribed today was an antibiotic per urine results. I also confirmed this with [facility]. Son voiced understanding.  ?

## 2021-12-14 LAB — URINE CULTURE

## 2021-12-15 ENCOUNTER — Other Ambulatory Visit: Payer: Self-pay | Admitting: Family

## 2021-12-15 MED ORDER — AMOXICILLIN-POT CLAVULANATE 875-125 MG PO TABS: 1.0000 | ORAL_TABLET | Freq: Two times a day (BID) | ORAL | 0 refills | Status: DC

## 2021-12-19 ENCOUNTER — Other Ambulatory Visit: Payer: Self-pay | Admitting: Cardiovascular Disease

## 2021-12-19 DIAGNOSIS — I119 Hypertensive heart disease without heart failure: Secondary | ICD-10-CM

## 2021-12-19 MED ORDER — LOSARTAN POTASSIUM 100 MG PO TABS: 100.0000 mg | ORAL_TABLET | Freq: Every day | ORAL | 3 refills | Status: DC

## 2022-01-01 ENCOUNTER — Ambulatory Visit (INDEPENDENT_AMBULATORY_CARE_PROVIDER_SITE_OTHER): Payer: Medicare PPO | Admitting: Family Medicine

## 2022-01-01 ENCOUNTER — Encounter: Payer: Self-pay | Admitting: Family Medicine

## 2022-01-01 DIAGNOSIS — R399 Unspecified symptoms and signs involving the genitourinary system: Secondary | ICD-10-CM | POA: Diagnosis not present

## 2022-01-01 DIAGNOSIS — R443 Hallucinations, unspecified: Secondary | ICD-10-CM

## 2022-01-01 LAB — MICROSCOPIC EXAMINATION
Bacteria, UA: NONE SEEN
RBC, Urine: NONE SEEN /hpf (ref 0–2)
Renal Epithel, UA: NONE SEEN /hpf

## 2022-01-01 LAB — URINALYSIS, ROUTINE W REFLEX MICROSCOPIC
Bilirubin, UA: NEGATIVE
Glucose, UA: NEGATIVE
Ketones, UA: NEGATIVE
Nitrite, UA: NEGATIVE
Protein,UA: NEGATIVE
RBC, UA: NEGATIVE
Specific Gravity, UA: 1.005 (ref 1.005–1.030)
Urobilinogen, Ur: 0.2 mg/dL (ref 0.2–1.0)
pH, UA: 6.5 (ref 5.0–7.5)

## 2022-01-01 NOTE — Progress Notes (Signed)
? ?  Virtual Visit  Note ?Due to COVID-19 pandemic this visit was conducted virtually. This visit type was conducted due to national recommendations for restrictions regarding the COVID-19 Pandemic (e.g. social distancing, sheltering in place) in an effort to limit this patient's exposure and mitigate transmission in our community. All issues noted in this document were discussed and addressed.  A physical exam was not performed with this format. ? ?I connected with Valerie [name] on 01/01/22 at 1119 by telephone and verified that I am speaking with the correct person using two identifiers. Valerie [name] is currently located at home and no one is currently with her during the visit. The provider, Gwenlyn Perking, FNP is located in their office at time of visit. ? ?I discussed the limitations, risks, security and privacy concerns of performing an evaluation and management service by telephone and the availability of in person appointments. I also discussed with the patient that there may be a patient responsible charge related to this service. The patient expressed understanding and agreed to proceed. ? ?CC: hallucinations ? ?History and Present Illness: ? ?HPI ?Valerie [name] reports seeing plants and checkers that are not there. She reports that this started yesterday and that her vision is not right. She reports that this happens when she has a UTI. She denies urinary symptoms, fever, chills, dizziness, chest pain, shortness of breath, or focal weakness.  ? ? ? ?ROS ?As per HPI.  ? ?Observations/Objective: ?Alert and oriented x 3. Able to speak in full sentences without difficulty.  ? ?Assessment and Plan: ?[name] was seen today for hallucinations. ? ?Diagnoses and all orders for this visit: ? ?UTI symptoms ?Reports hallucinations as symptoms of UTI for her. UA is negative today, culture is pending.  ?-     Urinalysis, Routine w reflex microscopic ?-     Urine Culture ? ?Hallucinations ?Discussed that UA is negative and that  hallucinations and acute visual changes are concerning and should be evaluated in the ER for possible stroke. Instructed patient to call EMS now, she agrees to do so.  ? ? ? ?Follow Up Instructions: ?Patient will call EMS, needs further evaluation for possible stroke.  ? ?  ?I discussed the assessment and treatment plan with the patient. The patient was provided an opportunity to ask questions and all were answered. The patient agreed with the plan and demonstrated an understanding of the instructions. ?  ?The patient was advised to call back or seek an in-person evaluation if the symptoms worsen or if the condition fails to improve as anticipated. ? ?The above assessment and management plan was discussed with the patient. The patient verbalized understanding of and has agreed to the management plan. Patient is aware to call the clinic if symptoms persist or worsen. Patient is aware when to return to the clinic for a follow-up visit. Patient educated on when it is appropriate to go to the emergency department.  ? ?Time call ended:  1133 ? ?I provided 14 minutes of  non face-to-face time during this encounter. ? ? ? ?Gwenlyn Perking, FNP ? ? ?

## 2022-01-03 LAB — URINE CULTURE

## 2022-01-05 ENCOUNTER — Ambulatory Visit (INDEPENDENT_AMBULATORY_CARE_PROVIDER_SITE_OTHER): Payer: Medicare PPO | Admitting: Nurse Practitioner

## 2022-01-05 ENCOUNTER — Encounter: Payer: Self-pay | Admitting: Nurse Practitioner

## 2022-01-05 VITALS — BP 147/56 | HR 65 | Wt 101.0 lb

## 2022-01-05 DIAGNOSIS — R300 Dysuria: Secondary | ICD-10-CM

## 2022-01-05 DIAGNOSIS — R1084 Generalized abdominal pain: Secondary | ICD-10-CM

## 2022-01-05 LAB — URINALYSIS, ROUTINE W REFLEX MICROSCOPIC
Bilirubin, UA: NEGATIVE
Glucose, UA: NEGATIVE
Nitrite, UA: NEGATIVE
Specific Gravity, UA: 1.02 (ref 1.005–1.030)
Urobilinogen, Ur: 0.2 mg/dL (ref 0.2–1.0)
pH, UA: 5.5 (ref 5.0–7.5)

## 2022-01-05 NOTE — Progress Notes (Signed)
? ?Acute Office Visit ? ?Subjective:  ? ? Patient ID: Valerie [name], female    DOB: Jul 16, 1928, 88 y.o.   MRN: [id number] ? ?Chief Complaint  ?Patient presents with  ? Abdominal Pain  ?  Mid to lower abdomen. Sharp pain comes and goes  ? ? ?Abdominal Pain ?This is a new problem. The current episode started yesterday. The onset quality is gradual. The problem occurs intermittently. The problem has been gradually improving. The pain is located in the generalized abdominal region. The pain is at a severity of 2/10. The pain is mild. The quality of the pain is aching. The abdominal pain does not radiate. Associated symptoms include dysuria. Pertinent negatives include no anorexia, belching, constipation, diarrhea, fever, headaches, nausea or vomiting. Nothing aggravates the pain. The pain is relieved by Nothing. The treatment provided no relief.  ?Dysuria  ?This is a new problem. The current episode started yesterday. The problem occurs intermittently. The problem has been gradually improving. The quality of the pain is described as aching. The pain is at a severity of 2/10. There has been no fever. She is Not sexually active. Pertinent negatives include no chills, discharge, flank pain, nausea or vomiting. She has tried nothing for the symptoms.  ? ? ?Past Medical History:  ?Diagnosis Date  ? Anxiety   ? Arthritis   ? Broken ribs   ? Carotid artery disease ([place])   ? Cataract   ? Collar bone fracture   ? Right  ? Depression   ? Diverticulosis of colon with hemorrhage   ? Duodenitis   ? Esophageal reflux   ? Esophageal stricture   ? Essential hypertension   ? Glaucoma   ? Hiatal hernia   ? History of kidney stones   ?  x1   ? Hyperlipidemia   ? IBS (irritable bowel syndrome)   ? Palpitations   ? UTI (urinary tract infection) March 2015  ? ? ?Past Surgical History:  ?Procedure Laterality Date  ? CHOLECYSTECTOMY    ? COLONOSCOPY    ? DILATION AND CURETTAGE OF UTERUS    ? EYE SURGERY Bilateral   ? total of 5  ? HIP  ARTHROPLASTY Right 03/16/2021  ? Procedure: POSTERIOR ARTHROPLASTY BIPOLAR HIP (HEMIARTHROPLASTY);  Surgeon: [name], MD;  Location: [facility];  Service: Orthopedics;  Laterality: Right;  ? LASER PHOTO ABLATION Right 10/20/2016  ? Procedure: LASER PHOTO ABLATION;  Surgeon: Hayden Pedro, MD;  Location: [facility];  Service: Ophthalmology;  Laterality: Right;  Headscope laser  ? LEFT ROTATOR CUFF REPAIR X2 Left   ? PARS PLANA VITRECTOMY  10/20/2016  ? WITH 25G REMOVAL/SUTURE SECONDARY INTRAOCULAR LENS, GAS FLUID EXCHANGE   ? PARS PLANA VITRECTOMY Right 10/20/2016  ? Procedure: PARS PLANA VITRECTOMY WITH 25G REMOVAL/SUTURE SECONDARY INTRAOCULAR LENS, GAS FLUID EXCHANGE;  Surgeon: Hayden Pedro, MD;  Location: [facility];  Service: Ophthalmology;  Laterality: Right;  ? RIGHT ROTATOR CUFF REPAIR X1 Right   ? UPPER GASTROINTESTINAL ENDOSCOPY    ? with dilation  ? ? ?Family History  ?Problem Relation Age of Onset  ? Leukemia Mother   ? Colon cancer Father 27  ? Kidney cancer Brother   ? Bladder Cancer Brother   ? Stroke Sister   ? Esophageal cancer Neg Hx   ? Stomach cancer Neg Hx   ? ? ?Social History  ? ?Socioeconomic History  ? Marital status: Widowed  ?  Spouse name: Not on file  ? Number of children: 4  ?  Years of education: Not on file  ? Highest education level: Not on file  ?Occupational History  ? Occupation: retired  ?  Employer: RETIRED  ?Tobacco Use  ? Smoking status: Never  ? Smokeless tobacco: Never  ?Vaping Use  ? Vaping Use: Never used  ?Substance and Sexual Activity  ? Alcohol use: No  ? Drug use: No  ? Sexual activity: Not on file  ?Other Topics Concern  ? Not on file  ?Social History Narrative  ? Her grandson lives with her  ? She is legally blind  ? ?Social Determinants of Health  ? ?Financial Resource Strain: Medium Risk  ? Difficulty of Paying Living Expenses: Somewhat hard  ?Food Insecurity: No Food Insecurity  ? Worried About Charity fundraiser in the Last Year: Never true  ? Ran Out of Food in the Last  Year: Never true  ?Transportation Needs: No Transportation Needs  ? Lack of Transportation (Medical): No  ? Lack of Transportation (Non-Medical): No  ?Physical Activity: Insufficiently Active  ? Days of Exercise per Week: 7 days  ? Minutes of Exercise per Session: 10 min  ?Stress: Stress Concern Present  ? Feeling of Stress : Rather much  ?Social Connections: Moderately Integrated  ? Frequency of Communication with Friends and Family: More than three times a week  ? Frequency of Social Gatherings with Friends and Family: More than three times a week  ? Attends Religious Services: More than 4 times per year  ? Active Member of Clubs or Organizations: Yes  ? Attends Archivist Meetings: More than 4 times per year  ? Marital Status: Widowed  ?Intimate Partner Violence: Not At Risk  ? Fear of Current or Ex-Partner: No  ? Emotionally Abused: No  ? Physically Abused: No  ? Sexually Abused: No  ? ? ?Outpatient Medications Prior to Visit  ?Medication Sig Dispense Refill  ? acetaminophen (TYLENOL) 325 MG tablet Take 650 mg by mouth every 6 (six) hours as needed for headache (pain).    ? amLODipine (NORVASC) 10 MG tablet Take 1 tablet (10 mg total) by mouth daily. 90 tablet 1  ? apixaban (ELIQUIS) 2.5 MG TABS tablet TAKE  (1)  TABLET TWICE A DAY. 180 tablet 1  ? carvedilol (COREG) 3.125 MG tablet TAKE  (1)  TABLET TWICE A DAY WITH MEALS (BREAKFAST AND SUPPER) 180 tablet 1  ? docusate sodium (COLACE) 100 MG capsule Take 1 capsule (100 mg total) by mouth 2 (two) times daily. 10 capsule 0  ? fluticasone (FLONASE) 50 MCG/ACT nasal spray Place 2 sprays into both nostrils as needed for allergies or rhinitis. 16 g 2  ? losartan (COZAAR) 100 MG tablet Take 1 tablet (100 mg total) by mouth daily. 90 tablet 3  ? methimazole (TAPAZOLE) 5 MG tablet Take 1 tablet (5 mg total) by mouth daily. 90 tablet 3  ? nitroGLYCERIN (NITROSTAT) 0.4 MG SL tablet PLACE 1 TABLET UNDER THE TONGUE AT ONSET OF CHEST PAIN EVERY 5 MINTUES UP TO 3  TIMES AS NEEDED 25 tablet 0  ? pantoprazole (PROTONIX) 40 MG tablet Take 1 tablet (40 mg total) by mouth daily. 90 tablet 1  ? polyethylene glycol (MIRALAX / GLYCOLAX) 17 g packet Take 17 g by mouth daily as needed for mild constipation. 14 each 0  ? polyvinyl alcohol (LIQUIFILM TEARS) 1.4 % ophthalmic solution Place 1 drop into both eyes as needed for dry eyes.    ? pravastatin (PRAVACHOL) 10 MG tablet TAKE 1 TABLET  ONCE A DAY 90 tablet 1  ? terconazole (TERAZOL 7) 0.4 % vaginal cream Place 1 applicator vaginally at bedtime. 45 g 0  ? Vitamin D, Ergocalciferol, (DRISDOL) 1.25 MG (50000 UNIT) CAPS capsule Take 1 capsule (50,000 Units total) by mouth every 7 (seven) days. 12 capsule 0  ? amoxicillin-clavulanate (AUGMENTIN) 875-125 MG tablet Take 1 tablet by mouth 2 (two) times daily. 14 tablet 0  ? cephALEXin (KEFLEX) 500 MG capsule Take 1 capsule (500 mg total) by mouth 4 (four) times daily. 28 capsule 0  ? fluconazole (DIFLUCAN) 150 MG tablet Take 1 tablet (150 mg total) by mouth every three (3) days as needed. 3 tablet 0  ? ?No facility-administered medications prior to visit.  ? ? ?Allergies  ?Allergen Reactions  ? Aricept [Donepezil Hcl] Other (See Comments)  ?  Nightmares, near syncope, weak, decreased appetite.  ? Lisinopril Other (See Comments)  ?  Hair loss  ? Sulfa Antibiotics Nausea Only  ? Sulfasalazine Nausea Only  ? ? ?Review of Systems  ?Constitutional:  Negative for chills and fever.  ?HENT: Negative.    ?Gastrointestinal:  Positive for abdominal pain. Negative for anorexia, constipation, diarrhea, nausea and vomiting.  ?Genitourinary:  Positive for dysuria. Negative for flank pain.  ?Neurological:  Negative for headaches.  ?All other systems reviewed and are negative. ? ?   ?Objective:  ?  ?Physical Exam ?Vitals and nursing note reviewed.  ?Constitutional:   ?   Appearance: Normal appearance.  ?HENT:  ?   Head: Normocephalic.  ?   Right Ear: External ear normal.  ?   Left Ear: External ear normal.  ?    Nose: Nose normal.  ?Eyes:  ?   Conjunctiva/sclera: Conjunctivae normal.  ?Cardiovascular:  ?   Rate and Rhythm: Normal rate and regular rhythm.  ?   Pulses: Normal pulses.  ?   Heart sounds: Normal heart sound

## 2022-01-05 NOTE — Patient Instructions (Signed)
Dysuria ?Dysuria is pain or discomfort during urination. The pain or discomfort may be felt in the part of the body that drains urine from the bladder (urethra) or in the surrounding tissue of the genitals. The pain may also be felt in the groin area, lower abdomen, or lower back. ?You may have to urinate frequently or have the sudden feeling that you have to urinate (urgency). Dysuria can affect anyone, but it is more common in females. Dysuria can be caused by many different things, including: ?Urinary tract infection. ?Kidney stones or bladder stones. ?Certain STIs (sexually transmitted infections), such as chlamydia. ?Dehydration. ?Inflammation of the tissues of the vagina. ?Use of certain medicines. ?Use of certain soaps or scented products that cause irritation. ?Follow these instructions at home: ?Medicines ?Take over-the-counter and prescription medicines only as told by your health care provider. ?If you were prescribed an antibiotic medicine, take it as told by your health care provider. Do not stop taking the antibiotic even if you start to feel better. ?Eating and drinking ? ?Drink enough fluid to keep your urine pale yellow. ?Avoid caffeinated beverages, tea, and alcohol. These beverages can irritate the bladder and make dysuria worse. In males, alcohol may irritate the prostate. ?General instructions ?Watch your condition for any changes. ?Urinate often. Avoid holding urine for long periods of time. ?If you are female, you should wipe from front to back after urinating or having a bowel movement. Use each piece of toilet paper only once. ?Empty your bladder after sex. ?Keep all follow-up visits. This is important. ?If you had any tests done to find the cause of dysuria, it is up to you to get your test results. Ask your health care provider, or the department that is doing the test, when your results will be ready. ?Contact a health care provider if: ?You have a fever. ?You develop pain in your back or  sides. ?You have nausea or vomiting. ?You have blood in your urine. ?You are not urinating as often as you usually do. ?Get help right away if: ?Your pain is severe and not relieved with medicines. ?You cannot eat or drink without vomiting. ?You are confused. ?You have a rapid heartbeat while resting. ?You have shaking or chills. ?You feel extremely weak. ?Summary ?Dysuria is pain or discomfort while urinating. Many different conditions can lead to dysuria. ?If you have dysuria, you may have to urinate frequently or have the sudden feeling that you have to urinate (urgency). ?Watch your condition for any changes. Keep all follow-up visits. ?Make sure that you urinate often and drink enough fluid to keep your urine pale yellow. ?This information is not intended to replace advice given to you by your health care provider. Make sure you discuss any questions you have with your health care provider. ?Document Revised: 04/26/2020 Document Reviewed: 04/26/2020 ?Elsevier Patient Education ? 2022 Elsevier Inc. ? ?

## 2022-01-06 ENCOUNTER — Other Ambulatory Visit: Payer: Self-pay | Admitting: Nurse Practitioner

## 2022-01-06 DIAGNOSIS — R300 Dysuria: Secondary | ICD-10-CM

## 2022-01-06 MED ORDER — CEPHALEXIN 500 MG PO CAPS: 500.0000 mg | ORAL_CAPSULE | Freq: Two times a day (BID) | ORAL | 0 refills | Status: DC

## 2022-01-08 LAB — CULTURE, URINE COMPREHENSIVE

## 2022-01-09 ENCOUNTER — Telehealth: Payer: Self-pay | Admitting: Nurse Practitioner

## 2022-01-09 ENCOUNTER — Other Ambulatory Visit: Payer: Self-pay | Admitting: Nurse Practitioner

## 2022-01-09 DIAGNOSIS — N3000 Acute cystitis without hematuria: Secondary | ICD-10-CM

## 2022-01-09 MED ORDER — AMOXICILLIN-POT CLAVULANATE 875-125 MG PO TABS: 1.0000 | ORAL_TABLET | Freq: Two times a day (BID) | ORAL | 0 refills | Status: DC

## 2022-01-09 NOTE — Telephone Encounter (Signed)
Medication works differently for every one, if patient is still having hallucination, she may need to be reevaluated. She was treated for UTI. Has  the UTI symptoms resolved? We discussed patient going to eye doctor for an eye exam did she follow up? Sine you had the feeling that her vision was impaired and could be causing her to see objects differently.  ? ?If symptoms are uncontrolled over the weekend. Patient may also go  to the emergency department ? ?

## 2022-01-09 NOTE — Telephone Encounter (Signed)
Son asking how long does it take for cephALEXin (KEFLEX) 500 MG capsule to take effect. Pt is still hallucinating. Please call back ?

## 2022-01-12 NOTE — Telephone Encounter (Signed)
Spoke with daughter, she states symptoms have gotten better. Eye doctor appt schedule for 01/15/22 ?

## 2022-01-22 ENCOUNTER — Other Ambulatory Visit: Payer: Self-pay | Admitting: Nurse Practitioner

## 2022-01-22 DIAGNOSIS — K222 Esophageal obstruction: Secondary | ICD-10-CM

## 2022-01-23 ENCOUNTER — Encounter: Payer: Self-pay | Admitting: Nurse Practitioner

## 2022-01-23 ENCOUNTER — Ambulatory Visit: Payer: Medicare PPO | Admitting: Nurse Practitioner

## 2022-01-23 VITALS — BP 145/62 | HR 56 | Temp 97.3°F | Resp 20 | Ht 64.0 in | Wt 101.0 lb

## 2022-01-23 DIAGNOSIS — E782 Mixed hyperlipidemia: Secondary | ICD-10-CM

## 2022-01-23 DIAGNOSIS — K222 Esophageal obstruction: Secondary | ICD-10-CM

## 2022-01-23 DIAGNOSIS — I509 Heart failure, unspecified: Secondary | ICD-10-CM

## 2022-01-23 DIAGNOSIS — Z87440 Personal history of urinary (tract) infections: Secondary | ICD-10-CM

## 2022-01-23 DIAGNOSIS — E0590 Thyrotoxicosis, unspecified without thyrotoxic crisis or storm: Secondary | ICD-10-CM

## 2022-01-23 DIAGNOSIS — E538 Deficiency of other specified B group vitamins: Secondary | ICD-10-CM | POA: Diagnosis not present

## 2022-01-23 DIAGNOSIS — K5730 Diverticulosis of large intestine without perforation or abscess without bleeding: Secondary | ICD-10-CM | POA: Diagnosis not present

## 2022-01-23 DIAGNOSIS — R5383 Other fatigue: Secondary | ICD-10-CM

## 2022-01-23 DIAGNOSIS — D509 Iron deficiency anemia, unspecified: Secondary | ICD-10-CM

## 2022-01-23 DIAGNOSIS — I10 Essential (primary) hypertension: Secondary | ICD-10-CM

## 2022-01-23 DIAGNOSIS — I480 Paroxysmal atrial fibrillation: Secondary | ICD-10-CM | POA: Diagnosis not present

## 2022-01-23 DIAGNOSIS — I119 Hypertensive heart disease without heart failure: Secondary | ICD-10-CM

## 2022-01-23 LAB — URINALYSIS, COMPLETE
Bilirubin, UA: NEGATIVE
Glucose, UA: NEGATIVE
Nitrite, UA: NEGATIVE
RBC, UA: NEGATIVE
Specific Gravity, UA: 1.015 (ref 1.005–1.030)
Urobilinogen, Ur: 0.2 mg/dL (ref 0.2–1.0)
pH, UA: 6 (ref 5.0–7.5)

## 2022-01-23 LAB — MICROSCOPIC EXAMINATION
RBC, Urine: NONE SEEN /hpf (ref 0–2)
Renal Epithel, UA: NONE SEEN /hpf

## 2022-01-23 MED ORDER — NITROGLYCERIN 0.4 MG SL SUBL: SUBLINGUAL_TABLET | SUBLINGUAL | 0 refills | Status: DC

## 2022-01-23 MED ORDER — AMLODIPINE BESYLATE 10 MG PO TABS: 10.0000 mg | ORAL_TABLET | Freq: Every day | ORAL | 1 refills | Status: DC

## 2022-01-23 MED ORDER — PRAVASTATIN SODIUM 10 MG PO TABS: 10.0000 mg | ORAL_TABLET | Freq: Every day | ORAL | 1 refills | Status: DC

## 2022-01-23 MED ORDER — PANTOPRAZOLE SODIUM 40 MG PO TBEC
40.0000 mg | DELAYED_RELEASE_TABLET | Freq: Every day | ORAL | 1 refills | Status: DC
Start: 2022-01-23 — End: 2022-07-27

## 2022-01-23 MED ORDER — APIXABAN 2.5 MG PO TABS: ORAL_TABLET | ORAL | 1 refills | Status: DC

## 2022-01-23 MED ORDER — CARVEDILOL 3.125 MG PO TABS: ORAL_TABLET | ORAL | 1 refills | Status: DC

## 2022-01-23 NOTE — Progress Notes (Signed)
? ?Subjective:  ? ? Patient ID: Valerie [name], female    DOB: 1928-02-03, 88 y.o.   MRN: [id number] ? ? ?Chief Complaint: Medical Management of Chronic Issues ?  ? ?HPI: ? ?Valerie [name] is a 88 y.o. who identifies as a female who was assigned female at birth.  ? ?Social history: ?Lives with: by herself. She has a grandchild that stays with her a lot. ?Work history: retired Physicist, medical ? ? ?Comes in today for follow up of the following chronic medical issues: ? ?1. Recent urinary tract infection ?Had UTI recently and wants urine checked to see if resolved ? ?2. Essential hypertension ?No c/o chest pain, sob or headache. Doe snot ceck blood pressure at home. ?BP Readings from Last 3 Encounters:  ?01/23/22 (!) 145/62  ?01/05/22 (!) 147/56  ?12/01/21 140/64  ? ? ? ?3. Paroxysmal atrial fibrillation (HCC) ?She had an episode 2 days ago and called EMS. She flipped back befor ethey got there. ? ?4. Congestive heart failure, unspecified HF chronicity, unspecified heart failure type ([place]) ?Last saw cardiology on 12/01/21. Review of office note showed no change in plan of care. ? ?5. Diverticulosis of colon ?No recent flare ups ? ?6. Hyperthyroidism ?No problems that she is aware of ?Lab Results  ?Component Value Date  ? TSH 1.83 12/01/2021  ? ? ? ?7. Mixed hyperlipidemia ?Has a very poor appetite ?Lab Results  ?Component Value Date  ? CHOL 193 12/01/2021  ? HDL 74 12/01/2021  ? LDLCALC 108 (H) 12/01/2021  ? TRIG 56 12/01/2021  ? CHOLHDL 2.6 12/01/2021  ? ? ? ?8. Iron deficiency anemia, unspecified iron deficiency anemia type ?No c/o fatigue ?Lab Results  ?Component Value Date  ? HGB 13.1 07/28/2021  ? ? ? ?9. Other fatigue ?No more then usual ? ?10. Vitamin B12 deficiency ?Lab Results  ?Component Value Date  ? OACZYSAY30 293 07/28/2021  ? ? ? ? ?New complaints: ?None today ? ?Allergies  ?Allergen Reactions  ? Aricept [Donepezil Hcl] Other (See Comments)  ?  Nightmares, near syncope, weak, decreased appetite.  ? Lisinopril Other (See  Comments)  ?  Hair loss  ? Sulfa Antibiotics Nausea Only  ? Sulfasalazine Nausea Only  ? ?Outpatient Encounter Medications as of 01/23/2022  ?Medication Sig  ? acetaminophen (TYLENOL) 325 MG tablet Take 650 mg by mouth every 6 (six) hours as needed for headache (pain).  ? amLODipine (NORVASC) 10 MG tablet Take 1 tablet (10 mg total) by mouth daily.  ? apixaban (ELIQUIS) 2.5 MG TABS tablet TAKE  (1)  TABLET TWICE A DAY.  ? carvedilol (COREG) 3.125 MG tablet TAKE  (1)  TABLET TWICE A DAY WITH MEALS (BREAKFAST AND SUPPER)  ? docusate sodium (COLACE) 100 MG capsule Take 1 capsule (100 mg total) by mouth 2 (two) times daily.  ? fluticasone (FLONASE) 50 MCG/ACT nasal spray Place 2 sprays into both nostrils as needed for allergies or rhinitis.  ? losartan (COZAAR) 100 MG tablet Take 1 tablet (100 mg total) by mouth daily.  ? methimazole (TAPAZOLE) 5 MG tablet Take 1 tablet (5 mg total) by mouth daily.  ? nitroGLYCERIN (NITROSTAT) 0.4 MG SL tablet PLACE 1 TABLET UNDER THE TONGUE AT ONSET OF CHEST PAIN EVERY 5 MINTUES UP TO 3 TIMES AS NEEDED  ? pantoprazole (PROTONIX) 40 MG tablet TAKE ONE TABLET ONCE DAILY  ? polyethylene glycol (MIRALAX / GLYCOLAX) 17 g packet Take 17 g by mouth daily as needed for mild constipation.  ? polyvinyl alcohol (  LIQUIFILM TEARS) 1.4 % ophthalmic solution Place 1 drop into both eyes as needed for dry eyes.  ? pravastatin (PRAVACHOL) 10 MG tablet TAKE 1 TABLET ONCE A DAY  ? terconazole (TERAZOL 7) 0.4 % vaginal cream Place 1 applicator vaginally at bedtime.  ? Vitamin D, Ergocalciferol, (DRISDOL) 1.25 MG (50000 UNIT) CAPS capsule Take 1 capsule (50,000 Units total) by mouth every 7 (seven) days.  ? [DISCONTINUED] amoxicillin-clavulanate (AUGMENTIN) 875-125 MG tablet Take 1 tablet by mouth 2 (two) times daily.  ? ?No facility-administered encounter medications on file as of 01/23/2022.  ? ? ?Past Surgical History:  ?Procedure Laterality Date  ? CHOLECYSTECTOMY    ? COLONOSCOPY    ? DILATION AND  CURETTAGE OF UTERUS    ? EYE SURGERY Bilateral   ? total of 5  ? HIP ARTHROPLASTY Right 03/16/2021  ? Procedure: POSTERIOR ARTHROPLASTY BIPOLAR HIP (HEMIARTHROPLASTY);  Surgeon: [name], MD;  Location: [facility];  Service: Orthopedics;  Laterality: Right;  ? LASER PHOTO ABLATION Right 10/20/2016  ? Procedure: LASER PHOTO ABLATION;  Surgeon: Hayden Pedro, MD;  Location: [facility];  Service: Ophthalmology;  Laterality: Right;  Headscope laser  ? LEFT ROTATOR CUFF REPAIR X2 Left   ? PARS PLANA VITRECTOMY  10/20/2016  ? WITH 25G REMOVAL/SUTURE SECONDARY INTRAOCULAR LENS, GAS FLUID EXCHANGE   ? PARS PLANA VITRECTOMY Right 10/20/2016  ? Procedure: PARS PLANA VITRECTOMY WITH 25G REMOVAL/SUTURE SECONDARY INTRAOCULAR LENS, GAS FLUID EXCHANGE;  Surgeon: Hayden Pedro, MD;  Location: [facility];  Service: Ophthalmology;  Laterality: Right;  ? RIGHT ROTATOR CUFF REPAIR X1 Right   ? UPPER GASTROINTESTINAL ENDOSCOPY    ? with dilation  ? ? ?Family History  ?Problem Relation Age of Onset  ? Leukemia Mother   ? Colon cancer Father 14  ? Kidney cancer Brother   ? Bladder Cancer Brother   ? Stroke Sister   ? Esophageal cancer Neg Hx   ? Stomach cancer Neg Hx   ? ? ? ? ?Controlled substance contract: n/a ? ? ? ? ? ?Review of Systems ? ?   ?Objective:  ? Physical Exam ?Vitals and nursing note reviewed.  ?Constitutional:   ?   General: She is not in acute distress. ?   Appearance: Normal appearance. She is well-developed.  ?HENT:  ?   Head: Normocephalic.  ?   Right Ear: Tympanic membrane normal.  ?   Left Ear: Tympanic membrane normal.  ?   Nose: Nose normal.  ?   Mouth/Throat:  ?   Mouth: Mucous membranes are moist.  ?Eyes:  ?   Pupils: Pupils are equal, round, and reactive to light.  ?Neck:  ?   Vascular: No carotid bruit or JVD.  ?Cardiovascular:  ?   Rate and Rhythm: Normal rate. Rhythm irregular.  ?   Heart sounds: Normal heart sounds.  ?Pulmonary:  ?   Effort: Pulmonary effort is normal. No respiratory distress.  ?   Breath sounds:  Normal breath sounds. No wheezing or rales.  ?Chest:  ?   Chest wall: No tenderness.  ?Abdominal:  ?   General: Bowel sounds are normal. There is no distension or abdominal bruit.  ?   Palpations: Abdomen is soft. There is no hepatomegaly, splenomegaly, mass or pulsatile mass.  ?   Tenderness: There is no abdominal tenderness.  ?Musculoskeletal:     ?   General: Normal range of motion.  ?   Cervical back: Normal range of motion and neck supple.  ?  Lymphadenopathy:  ?   Cervical: No cervical adenopathy.  ?Skin: ?   General: Skin is warm and dry.  ?Neurological:  ?   Mental Status: She is alert and oriented to person, place, and time.  ?   Deep Tendon Reflexes: Reflexes are normal and symmetric.  ?Psychiatric:     ?   Behavior: Behavior normal.     ?   Thought Content: Thought content normal.     ?   Judgment: Judgment normal.  ? ?BP (!) 145/62   Pulse (!) 56   Temp (!) 97.3 ?F (36.3 ?C) (Temporal)   Resp 20   Ht _0  (1.626 m)   Wt 101 lb (45.8 kg)   SpO2 99%   BMI 17.34 kg/m?  ? ? ? ? ?   ?Assessment & Plan:  ?STARR URIAS comes in today with chief complaint of Medical Management of Chronic Issues ? ? ?Diagnosis and orders addressed: ? ?1. Recent urinary tract infection ?Urine clear ?- Urinalysis, Complete ?- Urine Culture ? ?2. Essential hypertension ?Low sodium diet- amLODipine (NORVASC) 10 MG tablet; Take 1 tablet (10 mg total) by mouth daily.  Dispense: 90 tablet; Refill: 1 ?- carvedilol (COREG) 3.125 MG tablet; TAKE  (1)  TABLET TWICE A DAY WITH MEALS (BREAKFAST AND SUPPER)  Dispense: 180 tablet; Refill: 1 ?- CBC with Differential/Platelet ?- CMP14+EGFR ? ?3. Paroxysmal atrial fibrillation (HCC) ?Avoid caffeine ?Keep diary episodes ? ?4. Congestive heart failure, unspecified HF chronicity, unspecified heart failure type ([place]) ?Keep follow up with cardiology ?- apixaban (ELIQUIS) 2.5 MG TABS tablet; TAKE  (1)  TABLET TWICE A DAY.  Dispense: 180 tablet; Refill: 1 ? ?5. Diverticulosis of colon ?Continue to  watch diet to prevent flare up ? ?6. Hyperthyroidism ?Labs pending ?- Thyroid Panel With TSH ? ?7. Mixed hyperlipidemia ?Low fat diet ?- Lipid panel ?- pravastatin (PRAVACHOL) 10 MG tablet; Take 1 tablet (10 mg tot

## 2022-01-23 NOTE — Patient Instructions (Signed)

## 2022-01-24 LAB — CMP14+EGFR
ALT: 8 IU/L (ref 0–32)
AST: 17 IU/L (ref 0–40)
Albumin/Globulin Ratio: 1.4 (ref 1.2–2.2)
Albumin: 4.2 g/dL (ref 3.5–4.6)
Alkaline Phosphatase: 87 IU/L (ref 44–121)
BUN/Creatinine Ratio: 23 (ref 12–28)
BUN: 21 mg/dL (ref 10–36)
Bilirubin Total: 0.5 mg/dL (ref 0.0–1.2)
CO2: 24 mmol/L (ref 20–29)
Calcium: 10 mg/dL (ref 8.7–10.3)
Chloride: 104 mmol/L (ref 96–106)
Creatinine, Ser: 0.91 mg/dL (ref 0.57–1.00)
Globulin, Total: 3 g/dL (ref 1.5–4.5)
Glucose: 88 mg/dL (ref 70–99)
Potassium: 4.2 mmol/L (ref 3.5–5.2)
Sodium: 143 mmol/L (ref 134–144)
Total Protein: 7.2 g/dL (ref 6.0–8.5)
eGFR: 59 mL/min/{1.73_m2} — ABNORMAL LOW (ref >59)

## 2022-01-24 LAB — CBC WITH DIFFERENTIAL/PLATELET
Basophils Absolute: 0 10*3/uL (ref 0.0–0.2)
Basos: 0 %
EOS (ABSOLUTE): 0.1 10*3/uL (ref 0.0–0.4)
Eos: 2 %
Hematocrit: 38.2 % (ref 34.0–46.6)
Hemoglobin: 13 g/dL (ref 11.1–15.9)
Immature Grans (Abs): 0 10*3/uL (ref 0.0–0.1)
Immature Granulocytes: 0 %
Lymphocytes Absolute: 2.1 10*3/uL (ref 0.7–3.1)
Lymphs: 30 %
MCH: 33.9 pg — ABNORMAL HIGH (ref 26.6–33.0)
MCHC: 34 g/dL (ref 31.5–35.7)
MCV: 100 fL — ABNORMAL HIGH (ref 79–97)
Monocytes Absolute: 0.6 10*3/uL (ref 0.1–0.9)
Monocytes: 8 %
Neutrophils Absolute: 4.4 10*3/uL (ref 1.4–7.0)
Neutrophils: 60 %
Platelets: 310 10*3/uL (ref 150–450)
RBC: 3.84 x10E6/uL (ref 3.77–5.28)
RDW: 12.2 % (ref 11.7–15.4)
WBC: 7.2 10*3/uL (ref 3.4–10.8)

## 2022-01-24 LAB — LIPID PANEL
Chol/HDL Ratio: 2.3 ratio (ref 0.0–4.4)
Cholesterol, Total: 188 mg/dL (ref 100–199)
HDL: 83 mg/dL (ref >39)
LDL Chol Calc (NIH): 90 mg/dL (ref 0–99)
Triglycerides: 82 mg/dL (ref 0–149)
VLDL Cholesterol Cal: 15 mg/dL (ref 5–40)

## 2022-01-24 LAB — THYROID PANEL WITH TSH
Free Thyroxine Index: 2.1 (ref 1.2–4.9)
T3 Uptake Ratio: 25 % (ref 24–39)
T4, Total: 8.5 ug/dL (ref 4.5–12.0)
TSH: 1.52 u[IU]/mL (ref 0.450–4.500)

## 2022-01-24 LAB — VITAMIN B12: Vitamin B-12: 268 pg/mL (ref 232–1245)

## 2022-01-25 LAB — URINE CULTURE

## 2022-02-18 ENCOUNTER — Other Ambulatory Visit: Payer: Self-pay | Admitting: Cardiovascular Disease

## 2022-02-18 DIAGNOSIS — E782 Mixed hyperlipidemia: Secondary | ICD-10-CM

## 2022-03-26 ENCOUNTER — Encounter: Payer: Self-pay | Admitting: Nurse Practitioner

## 2022-03-26 ENCOUNTER — Ambulatory Visit: Payer: Medicare PPO | Admitting: Nurse Practitioner

## 2022-03-26 VITALS — BP 170/72 | HR 65 | Temp 97.2°F | Resp 20 | Ht 64.0 in | Wt 103.0 lb

## 2022-03-26 DIAGNOSIS — R350 Frequency of micturition: Secondary | ICD-10-CM

## 2022-03-26 LAB — URINALYSIS, COMPLETE
Bilirubin, UA: NEGATIVE
Glucose, UA: NEGATIVE
Ketones, UA: NEGATIVE
Nitrite, UA: NEGATIVE
Protein,UA: NEGATIVE
Specific Gravity, UA: <1.005 — ABNORMAL LOW (ref 1.005–1.030)
Urobilinogen, Ur: 0.2 mg/dL (ref 0.2–1.0)
pH, UA: 6.5 (ref 5.0–7.5)

## 2022-03-26 LAB — MICROSCOPIC EXAMINATION
Epithelial Cells (non renal): NONE SEEN /hpf (ref 0–10)
Renal Epithel, UA: NONE SEEN /hpf
WBC, UA: NONE SEEN /hpf (ref 0–5)

## 2022-03-26 NOTE — Progress Notes (Signed)
   Subjective:    Patient ID: Valerie [name], female    DOB: 05/20/1928, 88 y.o.   MRN: [id number]  Chief Complaint: dysuria   Urinary Tract Infection  This is a new problem. The current episode started yesterday. The problem occurs every urination. The quality of the pain is described as burning. The pain is at a severity of 5/10. The pain is moderate. There has been no fever. She is Not sexually active. There is No history of pyelonephritis. Associated symptoms include frequency, hesitancy and urgency. Pertinent negatives include no chills. She has tried nothing for the symptoms. Her past medical history is significant for recurrent UTIs.       Review of Systems  Constitutional:  Negative for chills.  Genitourinary:  Positive for frequency, hesitancy and urgency.       Objective:   Physical Exam Constitutional:      Appearance: Normal appearance.  Cardiovascular:     Rate and Rhythm: Normal rate and regular rhythm.     Heart sounds: Normal heart sounds.  Pulmonary:     Effort: Pulmonary effort is normal.     Breath sounds: Normal breath sounds.  Skin:    General: Skin is warm.  Neurological:     General: No focal deficit present.     Mental Status: She is alert and oriented to person, place, and time.  Psychiatric:        Mood and Affect: Mood normal.        Behavior: Behavior normal.     BP (!) 170/72   Pulse 65   Temp (!) 97.2 F (36.2 C) (Temporal)   Resp 20   Ht '5\' 4"'$  (1.626 m)   Wt 103 lb (46.7 kg)   SpO2 96%   BMI 17.68 kg/m   Urine clear     Assessment & Plan:  Valerie [name] in today with chief complaint of Urinary Tract Infection   1. Frequent urination Continue to drink water RTO prn - Urinalysis, Complete - Urine Culture    The above assessment and management plan was discussed with the patient. The patient verbalized understanding of and has agreed to the management plan. Patient is aware to call the clinic if symptoms persist or worsen.  Patient is aware when to return to the clinic for a follow-up visit. Patient educated on when it is appropriate to go to the emergency department.   Mary-Margaret Hassell Done, FNP

## 2022-03-26 NOTE — Patient Instructions (Signed)

## 2022-03-27 LAB — URINE CULTURE

## 2022-04-04 DIAGNOSIS — I10 Essential (primary) hypertension: Secondary | ICD-10-CM | POA: Diagnosis not present

## 2022-04-04 DIAGNOSIS — R519 Headache, unspecified: Secondary | ICD-10-CM | POA: Diagnosis not present

## 2022-04-04 DIAGNOSIS — E785 Hyperlipidemia, unspecified: Secondary | ICD-10-CM | POA: Diagnosis not present

## 2022-04-04 DIAGNOSIS — R9431 Abnormal electrocardiogram [ECG] [EKG]: Secondary | ICD-10-CM | POA: Diagnosis not present

## 2022-04-04 DIAGNOSIS — I959 Hypotension, unspecified: Secondary | ICD-10-CM | POA: Diagnosis not present

## 2022-04-04 DIAGNOSIS — G4489 Other headache syndrome: Secondary | ICD-10-CM | POA: Diagnosis not present

## 2022-04-04 DIAGNOSIS — M542 Cervicalgia: Secondary | ICD-10-CM | POA: Diagnosis not present

## 2022-04-04 DIAGNOSIS — R030 Elevated blood-pressure reading, without diagnosis of hypertension: Secondary | ICD-10-CM | POA: Diagnosis not present

## 2022-04-04 DIAGNOSIS — Z79899 Other long term (current) drug therapy: Secondary | ICD-10-CM | POA: Diagnosis not present

## 2022-04-04 DIAGNOSIS — I4892 Unspecified atrial flutter: Secondary | ICD-10-CM | POA: Diagnosis not present

## 2022-04-28 ENCOUNTER — Ambulatory Visit: Payer: Self-pay | Admitting: *Deleted

## 2022-04-28 NOTE — Patient Instructions (Signed)
Valerie [name]  At some point during the past 4 years, I have worked with you through the [facility] Management Program at [facility].  Due to program changes I am removing myself from your care team.   If you are currently active with another CCM Team Member, you will remain active with them unless they reach out to you with additional information.   If you feel that you need services in the future,  please talk with your primary care provider and request a new referral for Care Management or Care Coordination services. This does not affect your status as a patient at [facility].   Thank you for allowing me to participate in your your healthcare journey.  Chong Sicilian, BSN, RN-BC Proofreader Dial: [phone]

## 2022-04-28 NOTE — Chronic Care Management (AMB) (Signed)
  Chronic Care Management   Note  04/28/2022 Name: Valerie [name] MRN: [id number] DOB: [birth year]   Due to changes in the Chronic Care Management program, I am removing myself as the [place] from the Care Team and closing [facility].   Patient does not have an open Care Plan with another CCM team member. Patient does not have a current CCM referral placed since 01/26/22. CCM enrollment status changed to "not enrolled".   Patient's PCP can place a new referral if the they needs Care Management or Care Coordination services in the future.  Chong Sicilian, BSN, RN-BC Proofreader Dial: [phone]

## 2022-05-12 ENCOUNTER — Emergency Department (HOSPITAL_COMMUNITY): Payer: Medicare PPO

## 2022-05-12 ENCOUNTER — Other Ambulatory Visit: Payer: Self-pay

## 2022-05-12 ENCOUNTER — Encounter (HOSPITAL_COMMUNITY): Payer: Self-pay

## 2022-05-12 ENCOUNTER — Emergency Department (HOSPITAL_COMMUNITY)
Admission: EM | Admit: 2022-05-12 | Discharge: 2022-05-12 | Disposition: A | Discharge status: Home / Self Care | Payer: Medicare PPO | Attending: Emergency Medicine | Admitting: Emergency Medicine

## 2022-05-12 DIAGNOSIS — M47816 Spondylosis without myelopathy or radiculopathy, lumbar region: Secondary | ICD-10-CM | POA: Diagnosis not present

## 2022-05-12 DIAGNOSIS — M8588 Other specified disorders of bone density and structure, other site: Secondary | ICD-10-CM | POA: Diagnosis not present

## 2022-05-12 DIAGNOSIS — M5459 Other low back pain: Secondary | ICD-10-CM | POA: Diagnosis not present

## 2022-05-12 DIAGNOSIS — R918 Other nonspecific abnormal finding of lung field: Secondary | ICD-10-CM | POA: Diagnosis not present

## 2022-05-12 DIAGNOSIS — M47814 Spondylosis without myelopathy or radiculopathy, thoracic region: Secondary | ICD-10-CM | POA: Insufficient documentation

## 2022-05-12 DIAGNOSIS — M549 Dorsalgia, unspecified: Secondary | ICD-10-CM

## 2022-05-12 DIAGNOSIS — M545 Low back pain, unspecified: Secondary | ICD-10-CM | POA: Diagnosis not present

## 2022-05-12 DIAGNOSIS — M546 Pain in thoracic spine: Secondary | ICD-10-CM | POA: Diagnosis not present

## 2022-05-12 DIAGNOSIS — R079 Chest pain, unspecified: Secondary | ICD-10-CM | POA: Diagnosis not present

## 2022-05-12 MED ORDER — TRAMADOL HCL 50 MG PO TABS
50.0000 mg | ORAL_TABLET | Freq: Once | ORAL | Status: AC
  Administered 2022-05-12: 50 mg via ORAL
  Filled 2022-05-12: qty 1

## 2022-05-12 MED ORDER — TRAMADOL HCL 50 MG PO TABS: 50.0000 mg | ORAL_TABLET | Freq: Four times a day (QID) | ORAL | 0 refills | Status: DC | PRN

## 2022-05-12 MED ORDER — ONDANSETRON 8 MG PO TBDP
8.0000 mg | ORAL_TABLET | Freq: Once | ORAL | Status: AC
  Administered 2022-05-12: 8 mg via ORAL
  Filled 2022-05-12: qty 1

## 2022-05-12 MED ORDER — ONDANSETRON HCL 4 MG PO TABS: 4.0000 mg | ORAL_TABLET | Freq: Three times a day (TID) | ORAL | 0 refills | Status: DC | PRN

## 2022-05-12 NOTE — ED Notes (Signed)
patient ambulated well in hallway, however she states the pain is so bad it is making her sick and she is asking for something for nausea-MD made aware

## 2022-05-12 NOTE — ED Triage Notes (Signed)
Patient states she picked up clothes for her grandson 3 days ago and has had back pain in thoracic region x 3 days.  Patient is mostly blind

## 2022-05-12 NOTE — Discharge Instructions (Signed)
The back pain you are having is nonspecific.  This means that it can be caused by various things.  The x-rays did show some arthritis in your upper and lower back.  You may have aggravated this, by bending over.  To help this discomfort, we are prescribing tramadol.  This is a narcotic pain reliever so might make you sleepy or weak.  Try to minimize using this medicine because of the side effects.  Use Tylenol, 650 mg, every 4-6 hours to help control pain.  You can add the tramadol if needed for severe pain.  Try using heat on the sore area 3-4 times a day.  Follow-up with your primary care doctor if not better in a few days.

## 2022-05-12 NOTE — ED Notes (Signed)
Patient transported to X-ray 

## 2022-05-12 NOTE — ED Provider Notes (Signed)
[name] EMERGENCY DEPARTMENT Provider Note   CSN: [id number] Arrival date & time: 05/12/22  4174     History  Chief Complaint  Patient presents with   Back Pain    Valerie [name] is a 88 y.o. female.  HPI She presents for evaluation of back pain.  She remembers injuring it 3 days ago when she bent over to pick up some clothing.  She is anticoagulated.  She presents today by EMS for evaluation.  She did not take her morning medications.  She has been eating well recently.  She has not been ill with cough, fever, dysuria or change in chronic urinary frequency.  She lives with her son and is here with her daughter.    Home Medications Prior to Admission medications   Medication Sig Start Date End Date Taking? Authorizing Provider  ondansetron (ZOFRAN) 4 MG tablet Take 1 tablet (4 mg total) by mouth every 8 (eight) hours as needed for nausea or vomiting. 05/12/22  Yes Daleen Bo, MD  traMADol (ULTRAM) 50 MG tablet Take 1 tablet (50 mg total) by mouth every 6 (six) hours as needed for moderate pain. 05/12/22  Yes Daleen Bo, MD  acetaminophen (TYLENOL) 325 MG tablet Take 650 mg by mouth every 6 (six) hours as needed for headache (pain).    [provider]  amLODipine (NORVASC) 10 MG tablet Take 1 tablet (10 mg total) by mouth daily. 01/23/22   Hassell Done Mary-Margaret, FNP  apixaban (ELIQUIS) 2.5 MG TABS tablet TAKE  (1)  TABLET TWICE A DAY. 01/23/22   Hassell Done, Mary-Margaret, FNP  carvedilol (COREG) 3.125 MG tablet TAKE  (1)  TABLET TWICE A DAY WITH MEALS (BREAKFAST AND SUPPER) 01/23/22   Hassell Done, Mary-Margaret, FNP  docusate sodium (COLACE) 100 MG capsule Take 1 capsule (100 mg total) by mouth 2 (two) times daily. 03/20/21   Arrien, Jimmy Picket, MD  fluticasone Asencion Islam) 50 MCG/ACT nasal spray Place 2 sprays into both nostrils as needed for allergies or rhinitis. 11/20/20   Hassell Done, Mary-Margaret, FNP  losartan (COZAAR) 100 MG tablet Take 1 tablet (100 mg total) by mouth daily.  12/19/21   Nahser, Wonda Cheng, MD  methimazole (TAPAZOLE) 5 MG tablet Take 1 tablet (5 mg total) by mouth daily. 12/02/21   Shamleffer, Melanie Crazier, MD  nitroGLYCERIN (NITROSTAT) 0.4 MG SL tablet PLACE 1 TABLET UNDER THE TONGUE AT ONSET OF CHEST PAIN EVERY 5 MINTUES UP TO 3 TIMES AS NEEDED 01/23/22   Hassell Done, Mary-Margaret, FNP  pantoprazole (PROTONIX) 40 MG tablet Take 1 tablet (40 mg total) by mouth daily. 01/23/22   Hassell Done, Mary-Margaret, FNP  polyethylene glycol (MIRALAX / GLYCOLAX) 17 g packet Take 17 g by mouth daily as needed for mild constipation. 03/20/21   Arrien, Jimmy Picket, MD  polyvinyl alcohol (LIQUIFILM TEARS) 1.4 % ophthalmic solution Place 1 drop into both eyes as needed for dry eyes.    [provider]  pravastatin (PRAVACHOL) 10 MG tablet Take 1 tablet (10 mg total) by mouth daily. 01/23/22   Hassell Done, Mary-Margaret, FNP  terconazole (TERAZOL 7) 0.4 % vaginal cream Place 1 applicator vaginally at bedtime. 12/11/21   Sharion Balloon, FNP  Vitamin D, Ergocalciferol, (DRISDOL) 1.25 MG (50000 UNIT) CAPS capsule Take 1 capsule (50,000 Units total) by mouth every 7 (seven) days. 11/20/19   Chevis Pretty, FNP      Allergies    Aricept Reather Littler hcl], Lisinopril, Sulfa antibiotics, and Sulfasalazine    Review of Systems   Review of Systems  Physical Exam Updated Vital Signs BP (!) 140/75   Pulse (!) 59   Temp 98 F (36.7 C)   Resp (!) 22   Ht 5' (1.524 m)   Wt 46.3 kg   SpO2 100%   BMI 19.92 kg/m  Physical Exam Vitals and nursing note reviewed.  Constitutional:      General: She is not in acute distress.    Appearance: She is well-developed. She is not ill-appearing or toxic-appearing.     Comments: Elderly, frail  HENT:     Head: Normocephalic and atraumatic.     Right Ear: External ear normal.     Left Ear: External ear normal.  Eyes:     Conjunctiva/sclera: Conjunctivae normal.     Pupils: Pupils are equal, round, and reactive to light.  Neck:      Trachea: Phonation normal.  Cardiovascular:     Rate and Rhythm: Normal rate and regular rhythm.     Heart sounds: Normal heart sounds.  Pulmonary:     Effort: Pulmonary effort is normal.     Breath sounds: Normal breath sounds.  Abdominal:     General: There is no distension.     Palpations: Abdomen is soft.     Tenderness: There is no abdominal tenderness.  Musculoskeletal:        General: No swelling, tenderness or deformity. Normal range of motion.     Cervical back: Normal range of motion and neck supple.     Comments: She is able to sit up easily in the bed.  Difficult to localize her site of pain, it is more left parathoracic than midline.  There is no palpable crepitation over the ribs.  Skin:    General: Skin is warm and dry.  Neurological:     Mental Status: She is alert and oriented to person, place, and time.     Cranial Nerves: No cranial nerve deficit.     Sensory: No sensory deficit.     Motor: No abnormal muscle tone.     Coordination: Coordination normal.  Psychiatric:        Mood and Affect: Mood normal.        Behavior: Behavior normal.        Thought Content: Thought content normal.        Judgment: Judgment normal.     ED Results / Procedures / Treatments   Labs (all labs ordered are listed, but only abnormal results are displayed) Labs Reviewed - No data to display  EKG None  Radiology DG Thoracic Spine 2 View  Result Date: 05/12/2022 CLINICAL DATA:  Pain, picked up clothes off the floor 3 days ago EXAM: THORACIC SPINE 2 VIEWS; LUMBAR SPINE - COMPLETE 4+ VIEW COMPARISON:  Chest radiographs, 06/13/2021 FINDINGS: Osteopenia. No acute fracture or dislocation of the thoracic or lumbar spine. Nonacute superior endplate wedge deformity of the T12 vertebral body. Minimal multilevel disc space height loss and osteophytosis throughout the thoracic and lumbar spine. Moderate facet degenerative change at the lower lumbar levels. Aortic atherosclerosis. No acute  abnormality of the lungs. Nonobstructive pattern of overlying bowel gas. IMPRESSION: 1. No acute fracture or dislocation of the thoracic or lumbar spine. 2. Nonacute superior endplate wedge deformity of the T12 vertebral body. 3. Minimal multilevel disc space height loss and osteophytosis throughout the thoracic and lumbar spine. 4. Moderate facet degenerative change at the lower lumbar levels. 5. Osteopenia. Electronically Signed   By: Delanna Ahmadi M.D.   On: 05/12/2022 09:47  DG Lumbar Spine Complete  Result Date: 05/12/2022 CLINICAL DATA:  Pain, picked up clothes off the floor 3 days ago EXAM: THORACIC SPINE 2 VIEWS; LUMBAR SPINE - COMPLETE 4+ VIEW COMPARISON:  Chest radiographs, 06/13/2021 FINDINGS: Osteopenia. No acute fracture or dislocation of the thoracic or lumbar spine. Nonacute superior endplate wedge deformity of the T12 vertebral body. Minimal multilevel disc space height loss and osteophytosis throughout the thoracic and lumbar spine. Moderate facet degenerative change at the lower lumbar levels. Aortic atherosclerosis. No acute abnormality of the lungs. Nonobstructive pattern of overlying bowel gas. IMPRESSION: 1. No acute fracture or dislocation of the thoracic or lumbar spine. 2. Nonacute superior endplate wedge deformity of the T12 vertebral body. 3. Minimal multilevel disc space height loss and osteophytosis throughout the thoracic and lumbar spine. 4. Moderate facet degenerative change at the lower lumbar levels. 5. Osteopenia. Electronically Signed   By: Delanna Ahmadi M.D.   On: 05/12/2022 09:47   DG Ribs Unilateral W/Chest Left  Result Date: 05/12/2022 CLINICAL DATA:  Left-sided chest pain. EXAM: LEFT RIBS AND CHEST - 3+ VIEW COMPARISON:  Chest x-ray 06/13/2021 FINDINGS: Lungs are hyperexpanded. Cardiopericardial silhouette is at upper limits of normal for size. Interstitial markings are diffusely coarsened with chronic features. Status post bilateral shoulder replacement. Bones are  diffusely demineralized. Oblique views of the left ribs show no evidence for an acute displaced left-sided rib fracture. IMPRESSION: Hyperexpansion with chronic interstitial coarsening. No evidence for acute displaced left-sided rib fracture. No pneumothorax or pleural effusion. Electronically Signed   By: Misty Stanley M.D.   On: 05/12/2022 09:42    Procedures Procedures    Medications Ordered in ED Medications  traMADol (ULTRAM) tablet 50 mg (50 mg Oral Given 05/12/22 0906)  ondansetron (ZOFRAN-ODT) disintegrating tablet 8 mg (8 mg Oral Given 05/12/22 1034)    ED Course/ Medical Decision Making/ A&P Clinical Course as of 05/12/22 1042  Tue May 12, 2022  1035 She states that her pain is gone now after tramadol and she was able to walk easily.  She did develop some mild nausea so will be treated with Zofran. [EW]    Clinical Course User Index [EW] Daleen Bo, MD                           Medical Decision Making Patient presenting with back pain after bending over several days ago.  No fall.  No symptoms concerning for spinal myelopathy.  No evidence for acute illness.  No other recent falls.  She requires evaluation for injuries such as compression fractures or incidental vertebral abnormalities.  Problems Addressed: Acute left-sided back pain, unspecified back location: acute illness or injury Spondylosis of lumbar region without myelopathy or radiculopathy: chronic illness or injury Spondylosis of thoracic region without myelopathy or radiculopathy: chronic illness or injury  Amount and/or Complexity of Data Reviewed Independent Historian:     Details: She is a cogent historian.  Family member at the bedside is in agreement. Radiology: ordered and independent interpretation performed.    Details: Radiography of thoracic and lumbosacral spines.  Diffuse degenerative changes are present without fracture or dislocation.  Risk Prescription drug management. Decision regarding  hospitalization. Risk Details: Patient presenting with back pain.  She is found to have spondylosis of the upper and lower back.  No evidence for myelopathy or significant radiculopathy.  Patient had improvement with medication treated in the ED.  She is stable for discharge with outpatient management.  We  will give short-term prescription for tramadol, with Zofran for nausea if needed.  Encouraged her to use Tylenol for pain, and the tramadol only if needed.  Recommend PCP follow-up as needed.           Final Clinical Impression(s) / ED Diagnoses Final diagnoses:  Acute left-sided back pain, unspecified back location  Spondylosis of lumbar region without myelopathy or radiculopathy  Spondylosis of thoracic region without myelopathy or radiculopathy    Rx / DC Orders ED Discharge Orders          Ordered    traMADol (ULTRAM) 50 MG tablet  Every 6 hours PRN        05/12/22 1038    ondansetron (ZOFRAN) 4 MG tablet  Every 8 hours PRN        05/12/22 1038              Daleen Bo, MD 05/12/22 1042

## 2022-05-18 ENCOUNTER — Encounter: Payer: Self-pay | Admitting: Nurse Practitioner

## 2022-05-18 ENCOUNTER — Ambulatory Visit: Payer: Medicare PPO | Admitting: Nurse Practitioner

## 2022-05-18 DIAGNOSIS — R399 Unspecified symptoms and signs involving the genitourinary system: Secondary | ICD-10-CM

## 2022-05-18 DIAGNOSIS — R300 Dysuria: Secondary | ICD-10-CM | POA: Diagnosis not present

## 2022-05-18 LAB — URINALYSIS, COMPLETE
Bilirubin, UA: NEGATIVE
Glucose, UA: NEGATIVE
Ketones, UA: NEGATIVE
Leukocytes,UA: NEGATIVE
Nitrite, UA: NEGATIVE
Protein,UA: NEGATIVE
RBC, UA: NEGATIVE
Specific Gravity, UA: 1.01 (ref 1.005–1.030)
Urobilinogen, Ur: 0.2 mg/dL (ref 0.2–1.0)
pH, UA: 6.5 (ref 5.0–7.5)

## 2022-05-18 LAB — MICROSCOPIC EXAMINATION
Bacteria, UA: NONE SEEN
Epithelial Cells (non renal): NONE SEEN /hpf (ref 0–10)
RBC, Urine: NONE SEEN /hpf (ref 0–2)
Renal Epithel, UA: NONE SEEN /hpf
WBC, UA: NONE SEEN /hpf (ref 0–5)

## 2022-05-18 MED ORDER — CEPHALEXIN 500 MG PO CAPS: 500.0000 mg | ORAL_CAPSULE | Freq: Two times a day (BID) | ORAL | 0 refills | Status: DC

## 2022-05-18 NOTE — Progress Notes (Signed)
Virtual Visit  Note Due to COVID-19 pandemic this visit was conducted virtually. This visit type was conducted due to national recommendations for restrictions regarding the COVID-19 Pandemic (e.g. social distancing, sheltering in place) in an effort to limit this patient's exposure and mitigate transmission in our community. All issues noted in this document were discussed and addressed.  A physical exam was not performed with this format.  I connected with Valerie [name] on 05/18/22 at 12:50 by telephone and verified that I am speaking with the correct person using two identifiers. Valerie [name] is currently located at home and noe one is currently with her during visit. The provider, Mary-Margaret Hassell Done, FNP is located in their office at time of visit.  I discussed the limitations, risks, security and privacy concerns of performing an evaluation and management service by telephone and the availability of in person appointments. I also discussed with the patient that there may be a patient responsible charge related to this service. The patient expressed understanding and agreed to proceed.   History and Present Illness:  Patient having UTI symptoms.  Urinary Tract Infection  This is a new problem. The current episode started in the past 7 days. The problem has been gradually worsening. The quality of the pain is described as burning. The pain is at a severity of 7/10. The pain is moderate. There has been no fever. She is Not sexually active. There is No history of pyelonephritis. Associated symptoms include frequency and urgency. Pertinent negatives include no discharge, flank pain or hematuria. She has tried nothing for the symptoms. The treatment provided no relief.      Review of Systems  Genitourinary:  Positive for frequency and urgency. Negative for flank pain and hematuria.     Observations/Objective: Alert and oriented- answers all questions appropriately No  distress   Assessment and Plan: Valerie [name] in today with chief complaint of No chief complaint on file.   1. Dysuria - Urine Culture - Urinalysis, Complete  2. UTI symptoms Take medication as prescribe Cotton underwear Take shower not bath Cranberry juice, yogurt Force fluids AZO over the counter X2 days RTO prn  Meds ordered this encounter  Medications   cephALEXin (KEFLEX) 500 MG capsule    Sig: Take 1 capsule (500 mg total) by mouth 2 (two) times daily.    Dispense:  14 capsule    Refill:  0    Order Specific Question:   Supervising Provider    Answer:   Caryl Pina A [[id number]]       Follow Up Instructions: prn    I discussed the assessment and treatment plan with the patient. The patient was provided an opportunity to ask questions and all were answered. The patient agreed with the plan and demonstrated an understanding of the instructions.   The patient was advised to call back or seek an in-person evaluation if the symptoms worsen or if the condition fails to improve as anticipated.  The above assessment and management plan was discussed with the patient. The patient verbalized understanding of and has agreed to the management plan. Patient is aware to call the clinic if symptoms persist or worsen. Patient is aware when to return to the clinic for a follow-up visit. Patient educated on when it is appropriate to go to the emergency department.   Time call ended:  1:02  I provided 12 minutes of  non face-to-face time during this encounter.    Mary-Margaret Hassell Done, FNP

## 2022-05-19 LAB — URINE CULTURE

## 2022-06-12 ENCOUNTER — Telehealth: Payer: Self-pay | Admitting: Cardiovascular Disease

## 2022-06-12 NOTE — Telephone Encounter (Signed)
Patient c/o Palpitations:  High priority if patient c/o lightheadedness, shortness of breath, or chest pain  How long have you had palpitations/irregular HR/ Afib? Are you having the symptoms now? Long time  Are you currently experiencing lightheadedness, SOB or CP? SOB  Do you have a history of afib (atrial fibrillation) or irregular heart rhythm? Yes  Have you checked your BP or HR? (document readings if available): No  Are you experiencing any other symptoms? Feels bad

## 2022-06-12 NOTE — Telephone Encounter (Signed)
Pt reports episode of possible AFib last night. States she "woke up last night, raised up and it was going fast".  Last about 5 minutes and then stopped. She also described a feeling that "hits me in my shoulder and goes down to my hip and leg".  She can't describe the feeling, but reports this is not new.  Informed pt that I am not sure this is related to the racing heart being she describes leg/hip involvement. Denies CP, weight gain/edema, dizziness/light headedness or syncope. Pt aware I will forward to MD for review/advisement, may be time for another monitor vs continuing monitor d/t infrequency of occurrences.  States she has not had this feeling/racing in a year. Aware office will call once MD advises, she is agreeable to plan.

## 2022-06-15 MED ORDER — PROPRANOLOL HCL 10 MG PO TABS: ORAL_TABLET | ORAL | 6 refills | Status: DC

## 2022-06-15 NOTE — Telephone Encounter (Signed)
Nahser, Valerie Cheng, MD  Donnalee Curry K Caller: Unspecified (3 days ago,  9:55 AM) Lets add propranolol 10 mg QID PRN  palpitations ( or suspected atrial fib )  Cont other meds..   Will see her at her next office visit   PN   Spoke with daughter [name] who understands to have her begin propranolol '10mg'$  up to four times daily. She will monitor her mother's BP to ensure it's not dropping too low (since she's on other antihypertensives) and will call us if she feels her mom should be seen before March-her next scheduled f/u appt.

## 2022-06-25 ENCOUNTER — Encounter: Payer: Self-pay | Admitting: Family Medicine

## 2022-06-25 ENCOUNTER — Ambulatory Visit (INDEPENDENT_AMBULATORY_CARE_PROVIDER_SITE_OTHER): Payer: Medicare PPO | Admitting: Family Medicine

## 2022-06-25 DIAGNOSIS — R110 Nausea: Secondary | ICD-10-CM

## 2022-06-25 DIAGNOSIS — R1030 Lower abdominal pain, unspecified: Secondary | ICD-10-CM

## 2022-06-25 DIAGNOSIS — R399 Unspecified symptoms and signs involving the genitourinary system: Secondary | ICD-10-CM | POA: Diagnosis not present

## 2022-06-25 LAB — URINALYSIS, ROUTINE W REFLEX MICROSCOPIC
Bilirubin, UA: NEGATIVE
Glucose, UA: NEGATIVE
Ketones, UA: NEGATIVE
Leukocytes,UA: NEGATIVE
Nitrite, UA: NEGATIVE
Protein,UA: NEGATIVE
RBC, UA: NEGATIVE
Specific Gravity, UA: 1.01 (ref 1.005–1.030)
Urobilinogen, Ur: 0.2 mg/dL (ref 0.2–1.0)
pH, UA: 6.5 (ref 5.0–7.5)

## 2022-06-25 MED ORDER — ONDANSETRON HCL 4 MG PO TABS: 4.0000 mg | ORAL_TABLET | Freq: Three times a day (TID) | ORAL | 0 refills | Status: DC | PRN

## 2022-06-25 NOTE — Progress Notes (Signed)
   Virtual Visit  Note Due to COVID-19 pandemic this visit was conducted virtually. This visit type was conducted due to national recommendations for restrictions regarding the COVID-19 Pandemic (e.g. social distancing, sheltering in place) in an effort to limit this patient's exposure and mitigate transmission in our community. All issues noted in this document were discussed and addressed.  A physical exam was not performed with this format.  I connected with Valerie [name] on 06/25/22 at 0800 by telephone and verified that I am speaking with the correct person using two identifiers. Valerie [name] is currently located at home and her family is currently with her during the visit. The provider, Gwenlyn Perking, FNP is located in their office at time of visit.  I discussed the limitations, risks, security and privacy concerns of performing an evaluation and management service by telephone and the availability of in person appointments. I also discussed with the patient that there may be a patient responsible charge related to this service. The patient expressed understanding and agreed to proceed.  CC: UTI symptoms  History and Present Illness:  HPI Urinary Tract Infection: Patient complains of suprapubic pressure She has had symptoms for 3 days. Patient denies fever, chills, nausea, vomiting, frequency, urgency, flank pain, or hematuria. Patient does have a history of recurrent UTI.  Patient does not have a history of pyelonephritis.   She reports that the pain in her stomach is lower. It is intermittent and short lasting. She has had some nausea and decreased appetite. Denies fever, chills, vomiting, diarrhea, or constipation.    ROS As per HPI.  Observations/Objective: Alert and oriented x 3. Able to speak in full sentences without difficulty.   Urine dipstick shows negative for all components.    Assessment and Plan: Diagnoses and all orders for this visit:  Lower abdominal pain With  nausea. Negative UA today. Denies fever, changes in bowel habits, or vomiting. Reports pain is intermittent and short lasting. Will send in zofran for nausea. Discussed hydration, bland diet. Strict return precautions given.  -     Urinalysis, Routine w reflex microscopic  Nausea -     ondansetron (ZOFRAN) 4 MG tablet; Take 1 tablet (4 mg total) by mouth every 8 (eight) hours as needed for nausea or vomiting.   Follow Up Instructions: Return to office for new or worsening symptoms, or if symptoms persist.     I discussed the assessment and treatment plan with the patient. The patient was provided an opportunity to ask questions and all were answered. The patient agreed with the plan and demonstrated an understanding of the instructions.   The patient was advised to call back or seek an in-person evaluation if the symptoms worsen or if the condition fails to improve as anticipated.  The above assessment and management plan was discussed with the patient. The patient verbalized understanding of and has agreed to the management plan. Patient is aware to call the clinic if symptoms persist or worsen. Patient is aware when to return to the clinic for a follow-up visit. Patient educated on when it is appropriate to go to the emergency department.   Time call ended:  0819  I provided 19 minutes of  non face-to-face time during this encounter.    Gwenlyn Perking, FNP

## 2022-07-01 ENCOUNTER — Telehealth: Payer: Self-pay | Admitting: Nurse Practitioner

## 2022-07-01 NOTE — Telephone Encounter (Signed)
Patient would like to speak with a nurse or provider covering for MMM. Has questions about some of her meds.

## 2022-07-01 NOTE — Telephone Encounter (Signed)
Back pain  One month ago  Went to [facility]  Had xray, meds given  Improved  Now hurting again  Appt scheduled for 10/5 at 11:05am with Monia Pouch

## 2022-07-02 ENCOUNTER — Encounter: Payer: Self-pay | Admitting: Family Medicine

## 2022-07-02 ENCOUNTER — Ambulatory Visit (INDEPENDENT_AMBULATORY_CARE_PROVIDER_SITE_OTHER): Payer: Medicare PPO | Admitting: Family Medicine

## 2022-07-02 VITALS — BP 155/65 | HR 61 | Temp 98.0°F | Ht 60.0 in | Wt 98.0 lb

## 2022-07-02 DIAGNOSIS — G8929 Other chronic pain: Secondary | ICD-10-CM | POA: Diagnosis not present

## 2022-07-02 DIAGNOSIS — M545 Low back pain, unspecified: Secondary | ICD-10-CM

## 2022-07-02 DIAGNOSIS — S22080A Wedge compression fracture of T11-T12 vertebra, initial encounter for closed fracture: Secondary | ICD-10-CM

## 2022-07-02 MED ORDER — PREDNISONE 10 MG (21) PO TBPK: ORAL_TABLET | ORAL | 0 refills | Status: DC

## 2022-07-02 NOTE — Progress Notes (Signed)
Subjective:  Patient ID: Valerie [name], female    DOB: 05/02/1928, 88 y.o.   MRN: [id number]  Patient Care Team: Chevis Pretty, FNP as PCP - General (Family Medicine) Nahser, Wonda Cheng, MD as PCP - Cardiology (Cardiology) Jaci Lazier, MD as Referring Physician (Ophthalmology) Lavera Guise, [facility] as [facility] (Pharmacist)   Chief Complaint:  Back Pain (Seen at [facility] about one month ago per pt with same symptom - would like to talk about medication - xray was completed of lumbar 05/12/22)   HPI: Valerie [name] is a 88 y.o. female presenting on 07/02/2022 for Back Pain (Seen at [facility] about one month ago per pt with same symptom - would like to talk about medication - xray was completed of lumbar 05/12/22)   Recurrent back pain. Seen in ED at [facility], imaging negative for acute findings, did reveal degenerative changes. Was placed on tramadol, states this has helped some with the pain but has not completely relieved it. She has not tried therapy. Denies injury.  Back Pain This is a chronic problem. The problem occurs constantly. The problem has been waxing and waning since onset. The pain is present in the lumbar spine and thoracic spine. The quality of the pain is described as aching, burning and shooting. The pain is moderate. The symptoms are aggravated by twisting, standing, sitting, position and bending. Pertinent negatives include no abdominal pain, bladder incontinence, bowel incontinence, chest pain, dysuria, fever, headaches, leg pain, numbness, paresis, paresthesias, pelvic pain, perianal numbness, tingling, weakness or weight loss. She has tried analgesics for the symptoms. The treatment provided no relief.    Relevant past medical, surgical, family, and social history reviewed and updated as indicated.  Allergies and medications reviewed and updated. Data reviewed: Chart in Epic.   Past Medical History:  Diagnosis Date   Anxiety     Arthritis    Broken ribs    Carotid artery disease ([place])    Cataract    Collar bone fracture    Right   Depression    Diverticulosis of colon with hemorrhage    Duodenitis    Esophageal reflux    Esophageal stricture    Essential hypertension    Glaucoma    Hiatal hernia    History of kidney stones     x1    Hyperlipidemia    IBS (irritable bowel syndrome)    Palpitations    UTI (urinary tract infection) March 2015    Past Surgical History:  Procedure Laterality Date   CHOLECYSTECTOMY     COLONOSCOPY     DILATION AND CURETTAGE OF UTERUS     EYE SURGERY Bilateral    total of 5   HIP ARTHROPLASTY Right 03/16/2021   Procedure: POSTERIOR ARTHROPLASTY BIPOLAR HIP (HEMIARTHROPLASTY);  Surgeon: [name], MD;  Location: [place];  Service: Orthopedics;  Laterality: Right;   LASER PHOTO ABLATION Right 10/20/2016   Procedure: LASER PHOTO ABLATION;  Surgeon: Hayden Pedro, MD;  Location: [place];  Service: Ophthalmology;  Laterality: Right;  Headscope laser   LEFT ROTATOR CUFF REPAIR X2 Left    PARS PLANA VITRECTOMY  10/20/2016   WITH 25G REMOVAL/SUTURE SECONDARY INTRAOCULAR LENS, GAS FLUID EXCHANGE    PARS PLANA VITRECTOMY Right 10/20/2016   Procedure: PARS PLANA VITRECTOMY WITH 25G REMOVAL/SUTURE SECONDARY INTRAOCULAR LENS, GAS FLUID EXCHANGE;  Surgeon: Hayden Pedro, MD;  Location: [place];  Service: Ophthalmology;  Laterality: Right;   RIGHT  ROTATOR CUFF REPAIR X1 Right    UPPER GASTROINTESTINAL ENDOSCOPY     with dilation    Social History   Socioeconomic History   Marital status: Widowed    Spouse name: Not on file   Number of children: 4   Years of education: Not on file   Highest education level: Not on file  Occupational History   Occupation: retired    Fish farm manager: RETIRED  Tobacco Use   Smoking status: Never   Smokeless tobacco: Never  Vaping Use   Vaping Use: Never used  Substance and Sexual Activity   Alcohol use: No   Drug use: No   Sexual activity: Not on  file  Other Topics Concern   Not on file  Social History Narrative   Her grandson lives with her   She is legally blind   Social Determinants of Health   Financial Resource Strain: Medium Risk (07/25/2021)   Overall Financial Resource Strain (CARDIA)    Difficulty of Paying Living Expenses: Somewhat hard  Food Insecurity: No Food Insecurity (07/25/2021)   Hunger Vital Sign    Worried About Running Out of Food in the Last Year: Never true    Ran Out of Food in the Last Year: Never true  Transportation Needs: No Transportation Needs (07/25/2021)   PRAPARE - Hydrologist (Medical): No    Lack of Transportation (Non-Medical): No  Physical Activity: Insufficiently Active (07/25/2021)   Exercise Vital Sign    Days of Exercise per Week: 7 days    Minutes of Exercise per Session: 10 min  Stress: Stress Concern Present (07/25/2021)   Loving    Feeling of Stress : Rather much  Social Connections: Moderately Integrated (07/25/2021)   Social Connection and Isolation Panel [NHANES]    Frequency of Communication with Friends and Family: More than three times a week    Frequency of Social Gatherings with Friends and Family: More than three times a week    Attends Religious Services: More than 4 times per year    Active Member of [employer] or Organizations: Yes    Attends Archivist Meetings: More than 4 times per year    Marital Status: Widowed  Intimate Partner Violence: Not At Risk (07/25/2021)   Humiliation, Afraid, Rape, and Kick questionnaire    Fear of Current or Ex-Partner: No    Emotionally Abused: No    Physically Abused: No    Sexually Abused: No    Outpatient Encounter Medications as of 07/02/2022  Medication Sig   acetaminophen (TYLENOL) 325 MG tablet Take 650 mg by mouth every 6 (six) hours as needed for headache (pain).   amLODipine (NORVASC) 10 MG tablet Take 1 tablet  (10 mg total) by mouth daily.   apixaban (ELIQUIS) 2.5 MG TABS tablet TAKE  (1)  TABLET TWICE A DAY.   carvedilol (COREG) 3.125 MG tablet TAKE  (1)  TABLET TWICE A DAY WITH MEALS (BREAKFAST AND SUPPER)   docusate sodium (COLACE) 100 MG capsule Take 1 capsule (100 mg total) by mouth 2 (two) times daily.   fluticasone (FLONASE) 50 MCG/ACT nasal spray Place 2 sprays into both nostrils as needed for allergies or rhinitis.   losartan (COZAAR) 100 MG tablet Take 1 tablet (100 mg total) by mouth daily.   methimazole (TAPAZOLE) 5 MG tablet Take 1 tablet (5 mg total) by mouth daily.   nitroGLYCERIN (NITROSTAT) 0.4 MG SL tablet PLACE 1  TABLET UNDER THE TONGUE AT ONSET OF CHEST PAIN EVERY 5 MINTUES UP TO 3 TIMES AS NEEDED   ondansetron (ZOFRAN) 4 MG tablet Take 1 tablet (4 mg total) by mouth every 8 (eight) hours as needed for nausea or vomiting.   pantoprazole (PROTONIX) 40 MG tablet Take 1 tablet (40 mg total) by mouth daily.   polyethylene glycol (MIRALAX / GLYCOLAX) 17 g packet Take 17 g by mouth daily as needed for mild constipation.   polyvinyl alcohol (LIQUIFILM TEARS) 1.4 % ophthalmic solution Place 1 drop into both eyes as needed for dry eyes.   pravastatin (PRAVACHOL) 10 MG tablet Take 1 tablet (10 mg total) by mouth daily.   predniSONE (STERAPRED UNI-PAK 21 TAB) 10 MG (21) TBPK tablet Use as directed on back of pill pack   propranolol (INDERAL) 10 MG tablet Take 1 tablet by mouth up to four times daily as needed for palpitations   traMADol (ULTRAM) 50 MG tablet Take 1 tablet (50 mg total) by mouth every 6 (six) hours as needed for moderate pain.   Vitamin D, Ergocalciferol, (DRISDOL) 1.25 MG (50000 UNIT) CAPS capsule Take 1 capsule (50,000 Units total) by mouth every 7 (seven) days.   No facility-administered encounter medications on file as of 07/02/2022.    Allergies  Allergen Reactions   Aricept [Donepezil Hcl] Other (See Comments)    Nightmares, near syncope, weak, decreased appetite.    Lisinopril Other (See Comments)    Hair loss   Sulfa Antibiotics Nausea Only   Sulfasalazine Nausea Only    Review of Systems  Constitutional:  Positive for activity change. Negative for appetite change, chills, diaphoresis, fatigue, fever, unexpected weight change and weight loss.  HENT: Negative.    Eyes: Negative.   Respiratory:  Negative for cough, chest tightness and shortness of breath.   Cardiovascular:  Negative for chest pain, palpitations and leg swelling.  Gastrointestinal:  Negative for abdominal pain, blood in stool, bowel incontinence, constipation, diarrhea, nausea and vomiting.  Endocrine: Negative.   Genitourinary:  Negative for bladder incontinence, decreased urine volume, difficulty urinating, dysuria, frequency, pelvic pain and urgency.  Musculoskeletal:  Positive for back pain, gait problem and myalgias. Negative for arthralgias, joint swelling, neck pain and neck stiffness.  Skin: Negative.   Allergic/Immunologic: Negative.   Neurological:  Negative for dizziness, tingling, facial asymmetry, weakness, light-headedness, numbness, headaches and paresthesias.  Hematological: Negative.   Psychiatric/Behavioral:  Negative for confusion, hallucinations, sleep disturbance and suicidal ideas.   All other systems reviewed and are negative.       Objective:  BP (!) 155/65   Pulse 61   Temp 98 F (36.7 C)   Ht 5' (1.524 m)   Wt 98 lb (44.5 kg)   SpO2 94%   BMI 19.14 kg/m    Wt Readings from Last 3 Encounters:  07/02/22 98 lb (44.5 kg)  05/12/22 102 lb (46.3 kg)  03/26/22 103 lb (46.7 kg)    Physical Exam Vitals and nursing note reviewed.  Constitutional:      General: She is not in acute distress.    Appearance: She is normal weight. She is not ill-appearing, toxic-appearing or diaphoretic.     Comments: Frail elderly  HENT:     Head: Normocephalic and atraumatic.  Cardiovascular:     Rate and Rhythm: Normal rate. Rhythm irregular.  Pulmonary:      Effort: Pulmonary effort is normal.     Breath sounds: Normal breath sounds.  Musculoskeletal:     Cervical  back: Normal.     Thoracic back: Tenderness present. No swelling, edema, deformity, signs of trauma, lacerations, spasms or bony tenderness. Decreased range of motion.     Lumbar back: Tenderness present. No swelling, edema, deformity, signs of trauma, lacerations, spasms or bony tenderness. Decreased range of motion. Negative right straight leg raise test and negative left straight leg raise test. No scoliosis.     Right hip: Normal.     Left hip: Normal.     Right lower leg: No edema.     Left lower leg: No edema.  Skin:    General: Skin is warm and dry.     Capillary Refill: Capillary refill takes less than 2 seconds.  Neurological:     General: No focal deficit present.     Mental Status: She is alert and oriented to person, place, and time.     Gait: Gait abnormal (antalgic, using cane).  Psychiatric:        Mood and Affect: Mood normal.        Behavior: Behavior normal.        Thought Content: Thought content normal.        Judgment: Judgment normal.     Results for orders placed or performed in visit on 06/25/22  Urinalysis, Routine w reflex microscopic  Result Value Ref Range   Specific Gravity, UA 1.010 1.005 - 1.030   pH, UA 6.5 5.0 - 7.5   Color, UA Yellow Yellow   Appearance Ur Clear Clear   Leukocytes,UA Negative Negative   Protein,UA Negative Negative/Trace   Glucose, UA Negative Negative   Ketones, UA Negative Negative   RBC, UA Negative Negative   Bilirubin, UA Negative Negative   Urobilinogen, Ur 0.2 0.2 - 1.0 mg/dL   Nitrite, UA Negative Negative       Pertinent labs & imaging results that were available during my care of the patient were reviewed by me and considered in my medical decision making.  Assessment & Plan:  Isabellarose was seen today for back pain.  Diagnoses and all orders for this visit:  Chronic bilateral low back pain without  sciatica Currently on tramadol and acetaminophen. Unable to treat with NSAID therapy as pt is on Eliquis. Will burst with steroids to see if beneficial. Would benefit from PT, pt will consider and let office know if she would like a referral. Medications as prescribed. Report new, worsening, or persistent symptoms.  -     predniSONE (STERAPRED UNI-PAK 21 TAB) 10 MG (21) TBPK tablet; Use as directed on back of pill pack     Continue all other maintenance medications.  Follow up plan: Return in about 4 weeks (around 07/30/2022), or if symptoms worsen or fail to improve.   Continue healthy lifestyle choices, including diet (rich in fruits, vegetables, and lean proteins, and low in salt and simple carbohydrates) and exercise (at least 30 minutes of moderate physical activity daily).  Educational handout given for arthritis, chronic back pain  The above assessment and management plan was discussed with the patient. The patient verbalized understanding of and has agreed to the management plan. Patient is aware to call the clinic if they develop any new symptoms or if symptoms persist or worsen. Patient is aware when to return to the clinic for a follow-up visit. Patient educated on when it is appropriate to go to the emergency department.   Monia Pouch, FNP-C [facility] [phone]

## 2022-07-07 ENCOUNTER — Ambulatory Visit (INDEPENDENT_AMBULATORY_CARE_PROVIDER_SITE_OTHER): Payer: Medicare PPO | Admitting: Family Medicine

## 2022-07-07 ENCOUNTER — Encounter: Payer: Self-pay | Admitting: Family Medicine

## 2022-07-07 DIAGNOSIS — G8929 Other chronic pain: Secondary | ICD-10-CM

## 2022-07-07 DIAGNOSIS — K5903 Drug induced constipation: Secondary | ICD-10-CM

## 2022-07-07 DIAGNOSIS — T402X5A Adverse effect of other opioids, initial encounter: Secondary | ICD-10-CM | POA: Diagnosis not present

## 2022-07-07 DIAGNOSIS — M545 Low back pain, unspecified: Secondary | ICD-10-CM

## 2022-07-07 MED ORDER — POLYETHYLENE GLYCOL 3350 17 G PO PACK: 17.0000 g | PACK | Freq: Every day | ORAL | 11 refills | Status: DC

## 2022-07-07 NOTE — Progress Notes (Signed)
Virtual Visit via telephone Note Due to COVID-19 pandemic this visit was conducted virtually. This visit type was conducted due to national recommendations for restrictions regarding the COVID-19 Pandemic (e.g. social distancing, sheltering in place) in an effort to limit this patient's exposure and mitigate transmission in our community. All issues noted in this document were discussed and addressed.  A physical exam was not performed with this format.   I connected with Valerie [name] on 07/07/2022 at [facility] by telephone and verified that I am speaking with the correct person using two identifiers. Valerie [name] is currently located at home and family is currently with them during visit. The provider, Monia Pouch, FNP is located in their office at time of visit.  I discussed the limitations, risks, security and privacy concerns of performing an evaluation and management service by virtual visit and the availability of in person appointments. I also discussed with the patient that there may be a patient responsible charge related to this service. The patient expressed understanding and agreed to proceed.  Subjective:  Patient ID: Valerie [name], female    DOB: 07-Jul-1928, 88 y.o.   MRN: [id number]  Chief Complaint:  Back Pain   HPI: Valerie [name] is a 88 y.o. female presenting on 07/07/2022 for Back Pain   Pt was seen in office 07/02/2022 for chronic back pain. She was placed on steroids and offered referral to PT, declined PT at that time as she wanted to see if medications worked. She states the medications were slightly beneficial but she still has pain and would like to see PT. She also reports constipation from recent tramadol use. She has not been taking Miralax as she does not have it.   Back Pain This is a recurrent problem. The current episode started more than 1 month ago. The problem occurs intermittently. The problem has been waxing and waning since onset. The pain is present in the  lumbar spine, sacro-iliac and gluteal. The quality of the pain is described as aching. The pain is mild. The symptoms are aggravated by standing, twisting and position. Associated symptoms include weakness (generalized). Pertinent negatives include no abdominal pain, bladder incontinence, bowel incontinence, chest pain, dysuria, fever, headaches, leg pain, numbness, paresis, paresthesias, pelvic pain, perianal numbness, tingling or weight loss. Risk factors include lack of exercise, sedentary lifestyle and poor posture. She has tried analgesics and NSAIDs for the symptoms. The treatment provided mild relief.     Relevant past medical, surgical, family, and social history reviewed and updated as indicated.  Allergies and medications reviewed and updated.   Past Medical History:  Diagnosis Date   Anxiety    Arthritis    Broken ribs    Carotid artery disease ([place])    Cataract    Collar bone fracture    Right   Depression    Diverticulosis of colon with hemorrhage    Duodenitis    Esophageal reflux    Esophageal stricture    Essential hypertension    Glaucoma    Hiatal hernia    History of kidney stones     x1    Hyperlipidemia    IBS (irritable bowel syndrome)    Palpitations    UTI (urinary tract infection) March 2015    Past Surgical History:  Procedure Laterality Date   CHOLECYSTECTOMY     COLONOSCOPY     DILATION AND CURETTAGE OF UTERUS     EYE SURGERY Bilateral    total of 5  HIP ARTHROPLASTY Right 03/16/2021   Procedure: POSTERIOR ARTHROPLASTY BIPOLAR HIP (HEMIARTHROPLASTY);  Surgeon: [name], MD;  Location: [facility];  Service: Orthopedics;  Laterality: Right;   LASER PHOTO ABLATION Right 10/20/2016   Procedure: LASER PHOTO ABLATION;  Surgeon: Hayden Pedro, MD;  Location: [facility];  Service: Ophthalmology;  Laterality: Right;  Headscope laser   LEFT ROTATOR CUFF REPAIR X2 Left    PARS PLANA VITRECTOMY  10/20/2016   WITH 25G REMOVAL/SUTURE SECONDARY INTRAOCULAR LENS,  GAS FLUID EXCHANGE    PARS PLANA VITRECTOMY Right 10/20/2016   Procedure: PARS PLANA VITRECTOMY WITH 25G REMOVAL/SUTURE SECONDARY INTRAOCULAR LENS, GAS FLUID EXCHANGE;  Surgeon: Hayden Pedro, MD;  Location: [facility];  Service: Ophthalmology;  Laterality: Right;   RIGHT ROTATOR CUFF REPAIR X1 Right    UPPER GASTROINTESTINAL ENDOSCOPY     with dilation    Social History   Socioeconomic History   Marital status: Widowed    Spouse name: Not on file   Number of children: 4   Years of education: Not on file   Highest education level: Not on file  Occupational History   Occupation: retired    Fish farm manager: RETIRED  Tobacco Use   Smoking status: Never   Smokeless tobacco: Never  Vaping Use   Vaping Use: Never used  Substance and Sexual Activity   Alcohol use: No   Drug use: No   Sexual activity: Not on file  Other Topics Concern   Not on file  Social History Narrative   Her grandson lives with her   She is legally blind   Social Determinants of Health   Financial Resource Strain: Medium Risk (07/25/2021)   Overall Financial Resource Strain (CARDIA)    Difficulty of Paying Living Expenses: Somewhat hard  Food Insecurity: No Food Insecurity (07/25/2021)   Hunger Vital Sign    Worried About Running Out of Food in the Last Year: Never true    Ran Out of Food in the Last Year: Never true  Transportation Needs: No Transportation Needs (07/25/2021)   PRAPARE - Hydrologist (Medical): No    Lack of Transportation (Non-Medical): No  Physical Activity: Insufficiently Active (07/25/2021)   Exercise Vital Sign    Days of Exercise per Week: 7 days    Minutes of Exercise per Session: 10 min  Stress: Stress Concern Present (07/25/2021)   [place]    Feeling of Stress : Rather much  Social Connections: Moderately Integrated (07/25/2021)   Social Connection and Isolation Panel [NHANES]     Frequency of Communication with Friends and Family: More than three times a week    Frequency of Social Gatherings with Friends and Family: More than three times a week    Attends Religious Services: More than 4 times per year    Active Member of [employer] or Organizations: Yes    Attends Archivist Meetings: More than 4 times per year    Marital Status: Widowed  Intimate Partner Violence: Not At Risk (07/25/2021)   Humiliation, Afraid, Rape, and Kick questionnaire    Fear of Current or Ex-Partner: No    Emotionally Abused: No    Physically Abused: No    Sexually Abused: No    Outpatient Encounter Medications as of 07/07/2022  Medication Sig   acetaminophen (TYLENOL) 325 MG tablet Take 650 mg by mouth every 6 (six) hours as needed for headache (pain).   amLODipine (NORVASC) 10  MG tablet Take 1 tablet (10 mg total) by mouth daily.   apixaban (ELIQUIS) 2.5 MG TABS tablet TAKE  (1)  TABLET TWICE A DAY.   carvedilol (COREG) 3.125 MG tablet TAKE  (1)  TABLET TWICE A DAY WITH MEALS (BREAKFAST AND SUPPER)   docusate sodium (COLACE) 100 MG capsule Take 1 capsule (100 mg total) by mouth 2 (two) times daily.   fluticasone (FLONASE) 50 MCG/ACT nasal spray Place 2 sprays into both nostrils as needed for allergies or rhinitis.   losartan (COZAAR) 100 MG tablet Take 1 tablet (100 mg total) by mouth daily.   methimazole (TAPAZOLE) 5 MG tablet Take 1 tablet (5 mg total) by mouth daily.   nitroGLYCERIN (NITROSTAT) 0.4 MG SL tablet PLACE 1 TABLET UNDER THE TONGUE AT ONSET OF CHEST PAIN EVERY 5 MINTUES UP TO 3 TIMES AS NEEDED   ondansetron (ZOFRAN) 4 MG tablet Take 1 tablet (4 mg total) by mouth every 8 (eight) hours as needed for nausea or vomiting.   pantoprazole (PROTONIX) 40 MG tablet Take 1 tablet (40 mg total) by mouth daily.   polyethylene glycol (MIRALAX / GLYCOLAX) 17 g packet Take 17 g by mouth daily.   polyvinyl alcohol (LIQUIFILM TEARS) 1.4 % ophthalmic solution Place 1 drop into both eyes  as needed for dry eyes.   pravastatin (PRAVACHOL) 10 MG tablet Take 1 tablet (10 mg total) by mouth daily.   predniSONE (STERAPRED UNI-PAK 21 TAB) 10 MG (21) TBPK tablet Use as directed on back of pill pack   propranolol (INDERAL) 10 MG tablet Take 1 tablet by mouth up to four times daily as needed for palpitations   traMADol (ULTRAM) 50 MG tablet Take 1 tablet (50 mg total) by mouth every 6 (six) hours as needed for moderate pain.   Vitamin D, Ergocalciferol, (DRISDOL) 1.25 MG (50000 UNIT) CAPS capsule Take 1 capsule (50,000 Units total) by mouth every 7 (seven) days.   [DISCONTINUED] polyethylene glycol (MIRALAX / GLYCOLAX) 17 g packet Take 17 g by mouth daily as needed for mild constipation.   No facility-administered encounter medications on file as of 07/07/2022.    Allergies  Allergen Reactions   Aricept [Donepezil Hcl] Other (See Comments)    Nightmares, near syncope, weak, decreased appetite.   Lisinopril Other (See Comments)    Hair loss   Sulfa Antibiotics Nausea Only   Sulfasalazine Nausea Only    Review of Systems  Constitutional:  Positive for activity change and appetite change. Negative for chills, diaphoresis, fatigue, fever, unexpected weight change and weight loss.  Eyes:  Negative for photophobia and visual disturbance.  Respiratory:  Negative for cough and shortness of breath.   Cardiovascular:  Negative for chest pain.  Gastrointestinal:  Positive for constipation. Negative for abdominal distention, abdominal pain, anal bleeding, blood in stool, bowel incontinence, diarrhea, nausea, rectal pain and vomiting.  Endocrine: Negative for polydipsia, polyphagia and polyuria.  Genitourinary:  Negative for bladder incontinence, decreased urine volume, difficulty urinating, dysuria and pelvic pain.  Musculoskeletal:  Positive for arthralgias, back pain, gait problem and myalgias. Negative for joint swelling, neck pain and neck stiffness.  Neurological:  Positive for weakness  (generalized). Negative for dizziness, tingling, tremors, seizures, syncope, facial asymmetry, speech difficulty, light-headedness, numbness, headaches and paresthesias.  Psychiatric/Behavioral:  Negative for confusion.   All other systems reviewed and are negative.        Observations/Objective: No vital signs or physical exam, this was a virtual health encounter.  Pt alert and oriented, answers  all questions appropriately, and able to speak in full sentences.    Assessment and Plan: Zonie was seen today for back pain.  Diagnoses and all orders for this visit:  Chronic bilateral low back pain without sciatica Will refer for home PT as pt continues to have pain. No red flags reported.  -     Ambulatory referral to Home Health  Constipation due to opioid therapy Has not been taking Miralax, will resend today. Aware to report continued symptoms.  -     polyethylene glycol (MIRALAX / GLYCOLAX) 17 g packet; Take 17 g by mouth daily.     Follow Up Instructions: Return if symptoms worsen or fail to improve.    I discussed the assessment and treatment plan with the patient. The patient was provided an opportunity to ask questions and all were answered. The patient agreed with the plan and demonstrated an understanding of the instructions.   The patient was advised to call back or seek an in-person evaluation if the symptoms worsen or if the condition fails to improve as anticipated.  The above assessment and management plan was discussed with the patient. The patient verbalized understanding of and has agreed to the management plan. Patient is aware to call the clinic if they develop any new symptoms or if symptoms persist or worsen. Patient is aware when to return to the clinic for a follow-up visit. Patient educated on when it is appropriate to go to the emergency department.    I provided 15 minutes of time during this telephone encounter.   Monia Pouch, FNP-C [facility] [address] [place], [place] [address] [phone] 07/07/2022

## 2022-07-09 NOTE — Telephone Encounter (Signed)
Calling back about back. Said they have not heard back and need to know what can be done to help with the pain. Please call back.

## 2022-07-10 ENCOUNTER — Telehealth: Payer: Self-pay | Admitting: Nurse Practitioner

## 2022-07-10 NOTE — Telephone Encounter (Signed)
Tried calling multiple numbers in the chart without answer and can't leave VM.

## 2022-07-10 NOTE — Telephone Encounter (Signed)
Multiple calls open regarding this. Will close encounter. 

## 2022-07-20 ENCOUNTER — Telehealth: Payer: Self-pay | Admitting: Nurse Practitioner

## 2022-07-20 NOTE — Telephone Encounter (Signed)
TC from [place] w/ [facility] For coding for [facility] referral needs more definitive Dx than chronic LBP Is patient diagnosed with T11 and T12 compression fracture for coding.

## 2022-07-21 DIAGNOSIS — S22080A Wedge compression fracture of T11-T12 vertebra, initial encounter for closed fracture: Secondary | ICD-10-CM | POA: Insufficient documentation

## 2022-07-21 NOTE — Telephone Encounter (Signed)
Baruch Gouty, FNP  You 1 hour ago (8:02 AM) Yes she has the compression fractures, I will add to chart.  LMOVM to Valerie [name] w/ [facility] w/ the above information.

## 2022-07-22 ENCOUNTER — Ambulatory Visit (INDEPENDENT_AMBULATORY_CARE_PROVIDER_SITE_OTHER): Payer: Medicare PPO

## 2022-07-22 DIAGNOSIS — M47816 Spondylosis without myelopathy or radiculopathy, lumbar region: Secondary | ICD-10-CM | POA: Diagnosis not present

## 2022-07-22 DIAGNOSIS — I2510 Atherosclerotic heart disease of native coronary artery without angina pectoris: Secondary | ICD-10-CM | POA: Diagnosis not present

## 2022-07-22 DIAGNOSIS — M545 Low back pain, unspecified: Secondary | ICD-10-CM

## 2022-07-22 DIAGNOSIS — M8588 Other specified disorders of bone density and structure, other site: Secondary | ICD-10-CM | POA: Diagnosis not present

## 2022-07-22 DIAGNOSIS — F419 Anxiety disorder, unspecified: Secondary | ICD-10-CM | POA: Diagnosis not present

## 2022-07-22 DIAGNOSIS — M2578 Osteophyte, vertebrae: Secondary | ICD-10-CM

## 2022-07-22 DIAGNOSIS — I10 Essential (primary) hypertension: Secondary | ICD-10-CM

## 2022-07-22 DIAGNOSIS — G8929 Other chronic pain: Secondary | ICD-10-CM | POA: Diagnosis not present

## 2022-07-22 DIAGNOSIS — S22080D Wedge compression fracture of T11-T12 vertebra, subsequent encounter for fracture with routine healing: Secondary | ICD-10-CM | POA: Diagnosis not present

## 2022-07-22 DIAGNOSIS — K5730 Diverticulosis of large intestine without perforation or abscess without bleeding: Secondary | ICD-10-CM

## 2022-07-22 DIAGNOSIS — M1990 Unspecified osteoarthritis, unspecified site: Secondary | ICD-10-CM

## 2022-07-22 DIAGNOSIS — K219 Gastro-esophageal reflux disease without esophagitis: Secondary | ICD-10-CM

## 2022-07-22 DIAGNOSIS — F32A Depression, unspecified: Secondary | ICD-10-CM

## 2022-07-27 ENCOUNTER — Ambulatory Visit: Payer: Medicare PPO | Admitting: Nurse Practitioner

## 2022-07-27 ENCOUNTER — Encounter: Payer: Self-pay | Admitting: Nurse Practitioner

## 2022-07-27 VITALS — BP 156/82 | HR 67 | Temp 96.8°F | Resp 20 | Ht 60.0 in | Wt 97.0 lb

## 2022-07-27 DIAGNOSIS — I10 Essential (primary) hypertension: Secondary | ICD-10-CM

## 2022-07-27 DIAGNOSIS — R300 Dysuria: Secondary | ICD-10-CM

## 2022-07-27 DIAGNOSIS — K222 Esophageal obstruction: Secondary | ICD-10-CM

## 2022-07-27 DIAGNOSIS — E538 Deficiency of other specified B group vitamins: Secondary | ICD-10-CM

## 2022-07-27 DIAGNOSIS — K5730 Diverticulosis of large intestine without perforation or abscess without bleeding: Secondary | ICD-10-CM

## 2022-07-27 DIAGNOSIS — I509 Heart failure, unspecified: Secondary | ICD-10-CM

## 2022-07-27 DIAGNOSIS — I6523 Occlusion and stenosis of bilateral carotid arteries: Secondary | ICD-10-CM | POA: Diagnosis not present

## 2022-07-27 DIAGNOSIS — I480 Paroxysmal atrial fibrillation: Secondary | ICD-10-CM

## 2022-07-27 DIAGNOSIS — E0590 Thyrotoxicosis, unspecified without thyrotoxic crisis or storm: Secondary | ICD-10-CM

## 2022-07-27 DIAGNOSIS — I119 Hypertensive heart disease without heart failure: Secondary | ICD-10-CM

## 2022-07-27 DIAGNOSIS — E782 Mixed hyperlipidemia: Secondary | ICD-10-CM

## 2022-07-27 DIAGNOSIS — Z23 Encounter for immunization: Secondary | ICD-10-CM

## 2022-07-27 DIAGNOSIS — D509 Iron deficiency anemia, unspecified: Secondary | ICD-10-CM

## 2022-07-27 LAB — URINALYSIS, COMPLETE
Bilirubin, UA: NEGATIVE
Glucose, UA: NEGATIVE
Ketones, UA: NEGATIVE
Nitrite, UA: NEGATIVE
Specific Gravity, UA: 1.02 (ref 1.005–1.030)
Urobilinogen, Ur: 0.2 mg/dL (ref 0.2–1.0)
pH, UA: 5.5 (ref 5.0–7.5)

## 2022-07-27 LAB — MICROSCOPIC EXAMINATION: Renal Epithel, UA: NONE SEEN /hpf

## 2022-07-27 MED ORDER — PROPRANOLOL HCL 10 MG PO TABS: ORAL_TABLET | ORAL | 6 refills | Status: DC

## 2022-07-27 MED ORDER — APIXABAN 2.5 MG PO TABS: ORAL_TABLET | ORAL | 1 refills | Status: DC

## 2022-07-27 MED ORDER — LOSARTAN POTASSIUM 100 MG PO TABS: 100.0000 mg | ORAL_TABLET | Freq: Every day | ORAL | 3 refills | Status: DC

## 2022-07-27 MED ORDER — CARVEDILOL 3.125 MG PO TABS: ORAL_TABLET | ORAL | 1 refills | Status: DC

## 2022-07-27 MED ORDER — PRAVASTATIN SODIUM 10 MG PO TABS: 10.0000 mg | ORAL_TABLET | Freq: Every day | ORAL | 1 refills | Status: DC

## 2022-07-27 MED ORDER — AMLODIPINE BESYLATE 10 MG PO TABS: 10.0000 mg | ORAL_TABLET | Freq: Every day | ORAL | 1 refills | Status: DC

## 2022-07-27 MED ORDER — PANTOPRAZOLE SODIUM 40 MG PO TBEC: 40.0000 mg | DELAYED_RELEASE_TABLET | Freq: Every day | ORAL | 1 refills | Status: DC

## 2022-07-27 NOTE — Patient Instructions (Signed)

## 2022-07-27 NOTE — Progress Notes (Signed)
Subjective:    Patient ID: Valerie [name], female    DOB: March 25, 1928, 88 y.o.   MRN: [id number]   Chief Complaint: medical management of chronic issues     HPI:  Valerie [name] is a 88 y.o. who identifies as a female who was assigned female at birth.   Social history: Lives with: her grandson lives with her Work history: retired   Scientist, forensic in today for follow up of the following chronic medical issues:  1. Essential hypertension No c/o chest pain, sob or headache. Does not check blood pressure at home. Sh eha snot taken her meds this morning BP Readings from Last 3 Encounters:  07/27/22 (!) 156/70  07/02/22 (!) 155/65  05/12/22 (!) 140/75      2. Asymptomatic bilateral carotid artery stenosis Last carotic duplex study was done in 2019. Less that 39 % stenosis bil. Patient has not wanted to repeat study due to her age.  3. Paroxysmal atrial fibrillation (HCC) Denies any palpitations or heart racing. Is on eliquis with no bleeding issues.  4. Congestive heart failure, unspecified HF chronicity, unspecified heart failure type ([place]) Has occasional lower extremity edema but otherwise is dong well.  5. Diverticulosis of colon Denies any recent flare ups  6. Hyperthyroidism Occasional fatigue but other wise no issue sthat sheis awareof. Lab Results  Component Value Date   TSH 1.520 01/23/2022     7. Mixed hyperlipidemia Does not really watch diet , but also has a very poor appetite. Lab Results  Component Value Date   CHOL 188 01/23/2022   HDL 83 01/23/2022   LDLCALC 90 01/23/2022   TRIG 82 01/23/2022   CHOLHDL 2.3 01/23/2022     8. Iron deficiency anemia, unspecified iron deficiency anemia type Lab Results  Component Value Date   HGB 13.0 01/23/2022     9. Vitamin B12 deficiency Lab Results  Component Value Date   VITAMINB12 268 01/23/2022    Wt Readings from Last 3 Encounters:  07/27/22 97 lb (44 kg)  07/02/22 98 lb (44.5 kg)  05/12/22 102 lb (46.3  kg)   BMI Readings from Last 3 Encounters:  07/27/22 18.94 kg/m  07/02/22 19.14 kg/m  05/12/22 19.92 kg/m     New complaints: None today  Allergies  Allergen Reactions   Aricept [Donepezil Hcl] Other (See Comments)    Nightmares, near syncope, weak, decreased appetite.   Lisinopril Other (See Comments)    Hair loss   Sulfa Antibiotics Nausea Only   Sulfasalazine Nausea Only   Outpatient Encounter Medications as of 07/27/2022  Medication Sig   acetaminophen (TYLENOL) 325 MG tablet Take 650 mg by mouth every 6 (six) hours as needed for headache (pain).   amLODipine (NORVASC) 10 MG tablet Take 1 tablet (10 mg total) by mouth daily.   apixaban (ELIQUIS) 2.5 MG TABS tablet TAKE  (1)  TABLET TWICE A DAY.   carvedilol (COREG) 3.125 MG tablet TAKE  (1)  TABLET TWICE A DAY WITH MEALS (BREAKFAST AND SUPPER)   docusate sodium (COLACE) 100 MG capsule Take 1 capsule (100 mg total) by mouth 2 (two) times daily.   fluticasone (FLONASE) 50 MCG/ACT nasal spray Place 2 sprays into both nostrils as needed for allergies or rhinitis.   losartan (COZAAR) 100 MG tablet Take 1 tablet (100 mg total) by mouth daily.   methimazole (TAPAZOLE) 5 MG tablet Take 1 tablet (5 mg total) by mouth daily.   nitroGLYCERIN (NITROSTAT) 0.4 MG SL tablet PLACE 1 TABLET  UNDER THE TONGUE AT ONSET OF CHEST PAIN EVERY 5 MINTUES UP TO 3 TIMES AS NEEDED   ondansetron (ZOFRAN) 4 MG tablet Take 1 tablet (4 mg total) by mouth every 8 (eight) hours as needed for nausea or vomiting.   pantoprazole (PROTONIX) 40 MG tablet Take 1 tablet (40 mg total) by mouth daily.   polyethylene glycol (MIRALAX / GLYCOLAX) 17 g packet Take 17 g by mouth daily.   polyvinyl alcohol (LIQUIFILM TEARS) 1.4 % ophthalmic solution Place 1 drop into both eyes as needed for dry eyes.   pravastatin (PRAVACHOL) 10 MG tablet Take 1 tablet (10 mg total) by mouth daily.   predniSONE (STERAPRED UNI-PAK 21 TAB) 10 MG (21) TBPK tablet Use as directed on back of  pill pack   propranolol (INDERAL) 10 MG tablet Take 1 tablet by mouth up to four times daily as needed for palpitations   traMADol (ULTRAM) 50 MG tablet Take 1 tablet (50 mg total) by mouth every 6 (six) hours as needed for moderate pain.   Vitamin D, Ergocalciferol, (DRISDOL) 1.25 MG (50000 UNIT) CAPS capsule Take 1 capsule (50,000 Units total) by mouth every 7 (seven) days.   No facility-administered encounter medications on file as of 07/27/2022.    Past Surgical History:  Procedure Laterality Date   CHOLECYSTECTOMY     COLONOSCOPY     DILATION AND CURETTAGE OF UTERUS     EYE SURGERY Bilateral    total of 5   HIP ARTHROPLASTY Right 03/16/2021   Procedure: POSTERIOR ARTHROPLASTY BIPOLAR HIP (HEMIARTHROPLASTY);  Surgeon: [name], MD;  Location: [facility];  Service: Orthopedics;  Laterality: Right;   LASER PHOTO ABLATION Right 10/20/2016   Procedure: LASER PHOTO ABLATION;  Surgeon: Hayden Pedro, MD;  Location: [facility];  Service: Ophthalmology;  Laterality: Right;  Headscope laser   LEFT ROTATOR CUFF REPAIR X2 Left    PARS PLANA VITRECTOMY  10/20/2016   WITH 25G REMOVAL/SUTURE SECONDARY INTRAOCULAR LENS, GAS FLUID EXCHANGE    PARS PLANA VITRECTOMY Right 10/20/2016   Procedure: PARS PLANA VITRECTOMY WITH 25G REMOVAL/SUTURE SECONDARY INTRAOCULAR LENS, GAS FLUID EXCHANGE;  Surgeon: Hayden Pedro, MD;  Location: [facility];  Service: Ophthalmology;  Laterality: Right;   RIGHT ROTATOR CUFF REPAIR X1 Right    UPPER GASTROINTESTINAL ENDOSCOPY     with dilation    Family History  Problem Relation Age of Onset   Leukemia Mother    Colon cancer Father 78   Kidney cancer Brother    Bladder Cancer Brother    Stroke Sister    Esophageal cancer Neg Hx    Stomach cancer Neg Hx       Controlled substance contract: n/a     Review of Systems  Constitutional:  Negative for diaphoresis.  Eyes:  Negative for pain.  Respiratory:  Negative for shortness of breath.   Cardiovascular:  Negative  for chest pain, palpitations and leg swelling.  Gastrointestinal:  Negative for abdominal pain.  Endocrine: Negative for polydipsia.  Skin:  Negative for rash.  Neurological:  Negative for dizziness, weakness and headaches.  Hematological:  Does not bruise/bleed easily.  All other systems reviewed and are negative.      Objective:   Physical Exam Vitals and nursing note reviewed.  Constitutional:      General: She is not in acute distress.    Appearance: Normal appearance. She is well-developed.  HENT:     Head: Normocephalic.     Right Ear: Tympanic membrane normal.  Left Ear: Tympanic membrane normal.     Nose: Nose normal.     Mouth/Throat:     Mouth: Mucous membranes are moist.  Eyes:     Pupils: Pupils are equal, round, and reactive to light.  Neck:     Vascular: No carotid bruit or JVD.  Cardiovascular:     Rate and Rhythm: Normal rate and regular rhythm.     Heart sounds: Murmur (2/6 systolic) heard.  Pulmonary:     Effort: Pulmonary effort is normal. No respiratory distress.     Breath sounds: Normal breath sounds. No wheezing or rales.  Chest:     Chest wall: No tenderness.  Abdominal:     General: Bowel sounds are normal. There is no distension or abdominal bruit.     Palpations: Abdomen is soft. There is no hepatomegaly, splenomegaly, mass or pulsatile mass.     Tenderness: There is no abdominal tenderness.  Musculoskeletal:        General: Normal range of motion.     Cervical back: Normal range of motion and neck supple.  Lymphadenopathy:     Cervical: No cervical adenopathy.  Skin:    General: Skin is warm and dry.  Neurological:     Mental Status: She is alert and oriented to person, place, and time.     Deep Tendon Reflexes: Reflexes are normal and symmetric.  Psychiatric:        Behavior: Behavior normal.        Thought Content: Thought content normal.        Judgment: Judgment normal.    BP (!) 156/82   Pulse 67   Temp (!) 96.8 F (36 C)  (Temporal)   Resp 20   Ht 5' (1.524 m)   Wt 97 lb (44 kg)   SpO2 98%   BMI 18.94 kg/m         Assessment & Plan:  Valerie [name] comes in today with chief complaint of Medical Management of Chronic Issues   Diagnosis and orders addressed:  1. Essential hypertension Low sodium diet - CBC with Differential/Platelet - CMP14+EGFR  2. Asymptomatic bilateral carotid artery stenosis Declines scan  3. Paroxysmal atrial fibrillation (HCC) Avoid caffeine  4. Congestive heart failure, unspecified HF chronicity, unspecified heart failure type ([place]) - apixaban (ELIQUIS) 2.5 MG TABS tablet; TAKE  (1)  TABLET TWICE A DAY.  Dispense: 180 tablet; Refill: 1 - propranolol (INDERAL) 10 MG tablet; Take 1 tablet by mouth up to four times daily as needed for palpitations  Dispense: 120 tablet; Refill: 6  5. Diverticulosis of colon Watch diet to prevent flare up  6. Hyperthyroidism Labs pending - Thyroid Panel With TSH  7. Mixed hyperlipidemia Low fat diet - Lipid panel - pravastatin (PRAVACHOL) 10 MG tablet; Take 1 tablet (10 mg total) by mouth daily.  Dispense: 90 tablet; Refill: 1  8. Iron deficiency anemia, unspecified iron deficiency anemia type Labs pending  9. Vitamin B12 deficiency Labs pending  10. Dysuria Urine clear - Urinalysis, Complete  11. Benign hypertensive heart disease without heart failure - amLODipine (NORVASC) 10 MG tablet; Take 1 tablet (10 mg total) by mouth daily.  Dispense: 90 tablet; Refill: 1 - carvedilol (COREG) 3.125 MG tablet; TAKE  (1)  TABLET TWICE A DAY WITH MEALS (BREAKFAST AND SUPPER)  Dispense: 180 tablet; Refill: 1 - losartan (COZAAR) 100 MG tablet; Take 1 tablet (100 mg total) by mouth daily.  Dispense: 90 tablet; Refill: 3  12. Stricture esophagus Chew  food good - pantoprazole (PROTONIX) 40 MG tablet; Take 1 tablet (40 mg total) by mouth daily.  Dispense: 90 tablet; Refill: 1   Labs pending Health Maintenance reviewed Diet and exercise  encouraged  Follow up plan: 6 months   Mary-Margaret Hassell Done, FNP

## 2022-07-28 LAB — CMP14+EGFR
ALT: 9 IU/L (ref 0–32)
AST: 15 IU/L (ref 0–40)
Albumin/Globulin Ratio: 2 (ref 1.2–2.2)
Albumin: 4.5 g/dL (ref 3.6–4.6)
Alkaline Phosphatase: 85 IU/L (ref 44–121)
BUN/Creatinine Ratio: 21 (ref 12–28)
BUN: 19 mg/dL (ref 10–36)
Bilirubin Total: 0.5 mg/dL (ref 0.0–1.2)
CO2: 23 mmol/L (ref 20–29)
Calcium: 10.2 mg/dL (ref 8.7–10.3)
Chloride: 105 mmol/L (ref 96–106)
Creatinine, Ser: 0.92 mg/dL (ref 0.57–1.00)
Globulin, Total: 2.3 g/dL (ref 1.5–4.5)
Glucose: 87 mg/dL (ref 70–99)
Potassium: 4.3 mmol/L (ref 3.5–5.2)
Sodium: 142 mmol/L (ref 134–144)
Total Protein: 6.8 g/dL (ref 6.0–8.5)
eGFR: 58 mL/min/{1.73_m2} — ABNORMAL LOW (ref >59)

## 2022-07-28 LAB — THYROID PANEL WITH TSH
Free Thyroxine Index: 1.7 (ref 1.2–4.9)
T3 Uptake Ratio: 24 % (ref 24–39)
T4, Total: 7 ug/dL (ref 4.5–12.0)
TSH: 2.36 u[IU]/mL (ref 0.450–4.500)

## 2022-07-28 LAB — LIPID PANEL
Chol/HDL Ratio: 3 ratio (ref 0.0–4.4)
Cholesterol, Total: 203 mg/dL — ABNORMAL HIGH (ref 100–199)
HDL: 67 mg/dL (ref >39)
LDL Chol Calc (NIH): 115 mg/dL — ABNORMAL HIGH (ref 0–99)
Triglycerides: 119 mg/dL (ref 0–149)
VLDL Cholesterol Cal: 21 mg/dL (ref 5–40)

## 2022-07-28 LAB — CBC WITH DIFFERENTIAL/PLATELET
Basophils Absolute: 0 10*3/uL (ref 0.0–0.2)
Basos: 0 %
EOS (ABSOLUTE): 0.1 10*3/uL (ref 0.0–0.4)
Eos: 2 %
Hematocrit: 38.3 % (ref 34.0–46.6)
Hemoglobin: 13 g/dL (ref 11.1–15.9)
Immature Grans (Abs): 0 10*3/uL (ref 0.0–0.1)
Immature Granulocytes: 0 %
Lymphocytes Absolute: 1.5 10*3/uL (ref 0.7–3.1)
Lymphs: 25 %
MCH: 33.8 pg — ABNORMAL HIGH (ref 26.6–33.0)
MCHC: 33.9 g/dL (ref 31.5–35.7)
MCV: 100 fL — ABNORMAL HIGH (ref 79–97)
Monocytes Absolute: 0.4 10*3/uL (ref 0.1–0.9)
Monocytes: 6 %
Neutrophils Absolute: 4 10*3/uL (ref 1.4–7.0)
Neutrophils: 67 %
Platelets: 344 10*3/uL (ref 150–450)
RBC: 3.85 x10E6/uL (ref 3.77–5.28)
RDW: 13.1 % (ref 11.7–15.4)
WBC: 6 10*3/uL (ref 3.4–10.8)

## 2022-07-29 LAB — URINE CULTURE

## 2022-07-31 ENCOUNTER — Encounter: Payer: Self-pay | Admitting: Family Medicine

## 2022-07-31 ENCOUNTER — Ambulatory Visit (INDEPENDENT_AMBULATORY_CARE_PROVIDER_SITE_OTHER): Payer: Medicare PPO | Admitting: Family Medicine

## 2022-07-31 ENCOUNTER — Other Ambulatory Visit: Payer: Medicare PPO

## 2022-07-31 DIAGNOSIS — R410 Disorientation, unspecified: Secondary | ICD-10-CM | POA: Diagnosis not present

## 2022-07-31 DIAGNOSIS — R300 Dysuria: Secondary | ICD-10-CM | POA: Diagnosis not present

## 2022-07-31 DIAGNOSIS — R443 Hallucinations, unspecified: Secondary | ICD-10-CM | POA: Diagnosis not present

## 2022-07-31 DIAGNOSIS — R109 Unspecified abdominal pain: Secondary | ICD-10-CM | POA: Diagnosis not present

## 2022-07-31 LAB — URINALYSIS, ROUTINE W REFLEX MICROSCOPIC
Bilirubin, UA: NEGATIVE
Glucose, UA: NEGATIVE
Ketones, UA: NEGATIVE
Leukocytes,UA: NEGATIVE
Nitrite, UA: NEGATIVE
Protein,UA: NEGATIVE
RBC, UA: NEGATIVE
Specific Gravity, UA: 1.01 (ref 1.005–1.030)
Urobilinogen, Ur: 0.2 mg/dL (ref 0.2–1.0)
pH, UA: 7 (ref 5.0–7.5)

## 2022-07-31 NOTE — Progress Notes (Signed)
Virtual Visit via telephone Note Due to COVID-19 pandemic this visit was conducted virtually. This visit type was conducted due to national recommendations for restrictions regarding the COVID-19 Pandemic (e.g. social distancing, sheltering in place) in an effort to limit this patient's exposure and mitigate transmission in our community. All issues noted in this document were discussed and addressed.  A physical exam was not performed with this format.   I connected with Valerie [name]'s daughter on 07/31/2022 at 1347 by telephone and verified that I am speaking with the correct person using two identifiers. Valerie [name] is currently located at home and family is currently with them during visit. The provider, Monia Pouch, FNP is located in their office at time of visit.  I discussed the limitations, risks, security and privacy concerns of performing an evaluation and management service by virtual visit and the availability of in person appointments. I also discussed with the patient that there may be a patient responsible charge related to this service. The patient expressed understanding and agreed to proceed.  Subjective:  Patient ID: Valerie [name], female    DOB: 08/13/1928, 88 y.o.   MRN: [id number]  Chief Complaint:  Dysuria and Altered Mental Status   HPI: Valerie [name] is a 88 y.o. female presenting on 07/31/2022 for Dysuria and Altered Mental Status   Daughter reports pt has had increased confusion with visual hallucinations over the last 2 days. States she has also complained of lower abdominal pain. Concerned she has another UTI. No fever or chills reported. Denies back or flank pain.      Relevant past medical, surgical, family, and social history reviewed and updated as indicated.  Allergies and medications reviewed and updated.   Past Medical History:  Diagnosis Date   Anxiety    Arthritis    Broken ribs    Carotid artery disease ([place])    Cataract    Collar bone fracture     Right   Depression    Diverticulosis of colon with hemorrhage    Duodenitis    Esophageal reflux    Esophageal stricture    Essential hypertension    Glaucoma    Hiatal hernia    History of kidney stones     x1    Hyperlipidemia    IBS (irritable bowel syndrome)    Palpitations    UTI (urinary tract infection) March 2015    Past Surgical History:  Procedure Laterality Date   CHOLECYSTECTOMY     COLONOSCOPY     DILATION AND CURETTAGE OF UTERUS     EYE SURGERY Bilateral    total of 5   HIP ARTHROPLASTY Right 03/16/2021   Procedure: POSTERIOR ARTHROPLASTY BIPOLAR HIP (HEMIARTHROPLASTY);  Surgeon: [name], MD;  Location: [facility];  Service: Orthopedics;  Laterality: Right;   LASER PHOTO ABLATION Right 10/20/2016   Procedure: LASER PHOTO ABLATION;  Surgeon: Hayden Pedro, MD;  Location: [facility];  Service: Ophthalmology;  Laterality: Right;  Headscope laser   LEFT ROTATOR CUFF REPAIR X2 Left    PARS PLANA VITRECTOMY  10/20/2016   WITH 25G REMOVAL/SUTURE SECONDARY INTRAOCULAR LENS, GAS FLUID EXCHANGE    PARS PLANA VITRECTOMY Right 10/20/2016   Procedure: PARS PLANA VITRECTOMY WITH 25G REMOVAL/SUTURE SECONDARY INTRAOCULAR LENS, GAS FLUID EXCHANGE;  Surgeon: Hayden Pedro, MD;  Location: [facility];  Service: Ophthalmology;  Laterality: Right;   RIGHT ROTATOR CUFF REPAIR X1 Right    UPPER GASTROINTESTINAL ENDOSCOPY     with  dilation    Social History   Socioeconomic History   Marital status: Widowed    Spouse name: Not on file   Number of children: 4   Years of education: Not on file   Highest education level: Not on file  Occupational History   Occupation: retired    Fish farm manager: RETIRED  Tobacco Use   Smoking status: Never   Smokeless tobacco: Never  Vaping Use   Vaping Use: Never used  Substance and Sexual Activity   Alcohol use: No   Drug use: No   Sexual activity: Not on file  Other Topics Concern   Not on file  Social History Narrative   Her grandson lives with  her   She is legally blind   Social Determinants of Health   Financial Resource Strain: Medium Risk (07/25/2021)   Overall Financial Resource Strain (CARDIA)    Difficulty of Paying Living Expenses: Somewhat hard  Food Insecurity: No Food Insecurity (07/25/2021)   Hunger Vital Sign    Worried About Running Out of Food in the Last Year: Never true    Ran Out of Food in the Last Year: Never true  Transportation Needs: No Transportation Needs (07/25/2021)   PRAPARE - Hydrologist (Medical): No    Lack of Transportation (Non-Medical): No  Physical Activity: Insufficiently Active (07/25/2021)   Exercise Vital Sign    Days of Exercise per Week: 7 days    Minutes of Exercise per Session: 10 min  Stress: Stress Concern Present (07/25/2021)   [name]    Feeling of Stress : Rather much  Social Connections: Moderately Integrated (07/25/2021)   Social Connection and Isolation Panel [NHANES]    Frequency of Communication with Friends and Family: More than three times a week    Frequency of Social Gatherings with Friends and Family: More than three times a week    Attends Religious Services: More than 4 times per year    Active Member of [employer] or Organizations: Yes    Attends Archivist Meetings: More than 4 times per year    Marital Status: Widowed  Intimate Partner Violence: Not At Risk (07/25/2021)   Humiliation, Afraid, Rape, and Kick questionnaire    Fear of Current or Ex-Partner: No    Emotionally Abused: No    Physically Abused: No    Sexually Abused: No    Outpatient Encounter Medications as of 07/31/2022  Medication Sig   acetaminophen (TYLENOL) 325 MG tablet Take 650 mg by mouth every 6 (six) hours as needed for headache (pain).   amLODipine (NORVASC) 10 MG tablet Take 1 tablet (10 mg total) by mouth daily.   apixaban (ELIQUIS) 2.5 MG TABS tablet TAKE  (1)  TABLET TWICE A  DAY.   carvedilol (COREG) 3.125 MG tablet TAKE  (1)  TABLET TWICE A DAY WITH MEALS (BREAKFAST AND SUPPER)   fluticasone (FLONASE) 50 MCG/ACT nasal spray Place 2 sprays into both nostrils as needed for allergies or rhinitis.   losartan (COZAAR) 100 MG tablet Take 1 tablet (100 mg total) by mouth daily.   methimazole (TAPAZOLE) 5 MG tablet Take 1 tablet (5 mg total) by mouth daily.   nitroGLYCERIN (NITROSTAT) 0.4 MG SL tablet PLACE 1 TABLET UNDER THE TONGUE AT ONSET OF CHEST PAIN EVERY 5 MINTUES UP TO 3 TIMES AS NEEDED   ondansetron (ZOFRAN) 4 MG tablet Take 1 tablet (4 mg total) by mouth every 8 (eight)  hours as needed for nausea or vomiting.   pantoprazole (PROTONIX) 40 MG tablet Take 1 tablet (40 mg total) by mouth daily.   polyvinyl alcohol (LIQUIFILM TEARS) 1.4 % ophthalmic solution Place 1 drop into both eyes as needed for dry eyes.   pravastatin (PRAVACHOL) 10 MG tablet Take 1 tablet (10 mg total) by mouth daily.   propranolol (INDERAL) 10 MG tablet Take 1 tablet by mouth up to four times daily as needed for palpitations   Vitamin D, Ergocalciferol, (DRISDOL) 1.25 MG (50000 UNIT) CAPS capsule Take 1 capsule (50,000 Units total) by mouth every 7 (seven) days.   No facility-administered encounter medications on file as of 07/31/2022.    Allergies  Allergen Reactions   Aricept [Donepezil Hcl] Other (See Comments)    Nightmares, near syncope, weak, decreased appetite.   Lisinopril Other (See Comments)    Hair loss   Sulfa Antibiotics Nausea Only   Sulfasalazine Nausea Only    Review of Systems  Unable to perform ROS: Mental status change (ROS per daughter)  Constitutional:  Negative for activity change, appetite change, chills, diaphoresis, fatigue, fever and unexpected weight change.  Gastrointestinal:  Positive for abdominal pain.  Genitourinary:  Positive for dysuria.  Psychiatric/Behavioral:  Positive for confusion and hallucinations.   All other systems reviewed and are  negative.        Observations/Objective: No vital signs or physical exam, this was a virtual health encounter.  Daughter provided information as pt is confused.    Assessment and Plan: Valerie [name] was seen today for dysuria and altered mental status.  Diagnoses and all orders for this visit:  Dysuria Urinalysis unremarkable. No indications of UTI. -     Urinalysis, Routine w reflex microscopic  Confusion No UTI per urinalysis. Unable to determine cause of increased confusion over the phone. Daughter aware that if symptoms progress or worsen, pt needs to be seen in person. If they worsen over the weekend, aware she needs to take her to the ED for evaluation.     Follow Up Instructions: Return if symptoms worsen or fail to improve.    I discussed the assessment and treatment plan with the patient. The patient was provided an opportunity to ask questions and all were answered. The patient agreed with the plan and demonstrated an understanding of the instructions.   The patient was advised to call back or seek an in-person evaluation if the symptoms worsen or if the condition fails to improve as anticipated.  The above assessment and management plan was discussed with the patient. The patient verbalized understanding of and has agreed to the management plan. Patient is aware to call the clinic if they develop any new symptoms or if symptoms persist or worsen. Patient is aware when to return to the clinic for a follow-up visit. Patient educated on when it is appropriate to go to the emergency department.    I provided 14 minutes of time during this telephone encounter.   Monia Pouch, FNP-C [facility] [address] [place], [place] [address] [phone] 07/31/2022

## 2022-08-18 ENCOUNTER — Encounter: Payer: Self-pay | Admitting: Family

## 2022-08-18 ENCOUNTER — Ambulatory Visit: Payer: Medicare PPO | Admitting: Family

## 2022-08-18 ENCOUNTER — Ambulatory Visit (INDEPENDENT_AMBULATORY_CARE_PROVIDER_SITE_OTHER): Payer: Medicare PPO | Admitting: Family

## 2022-08-18 ENCOUNTER — Telehealth: Payer: Medicare PPO | Admitting: Family

## 2022-08-18 DIAGNOSIS — K5903 Drug induced constipation: Secondary | ICD-10-CM

## 2022-08-18 DIAGNOSIS — T402X5A Adverse effect of other opioids, initial encounter: Secondary | ICD-10-CM | POA: Diagnosis not present

## 2022-08-18 DIAGNOSIS — M545 Low back pain, unspecified: Secondary | ICD-10-CM | POA: Diagnosis not present

## 2022-08-18 MED ORDER — TRAMADOL HCL 50 MG PO TABS: 50.0000 mg | ORAL_TABLET | Freq: Two times a day (BID) | ORAL | 0 refills | Status: AC | PRN

## 2022-08-18 NOTE — Progress Notes (Signed)
Virtual Visit  Note Due to COVID-19 pandemic this visit was conducted virtually. This visit type was conducted due to national recommendations for restrictions regarding the COVID-19 Pandemic (e.g. social distancing, sheltering in place) in an effort to limit this patient's exposure and mitigate transmission in our community. All issues noted in this document were discussed and addressed.  A physical exam was not performed with this format.  I connected with Valerie [name] on 08/18/22 at 10:45 AM  by telephone and verified that I am speaking with the correct person using two identifiers. Valerie [name] is currently located at home and no one is currently with her during visit. The provider, Evelina Dun, FNP is located in their office at time of visit.  I discussed the limitations, risks, security and privacy concerns of performing an evaluation and management service by telephone and the availability of in person appointments. I also discussed with the patient that there may be a patient responsible charge related to this service. The patient expressed understanding and agreed to proceed.  [name], Valerie [name] are scheduled for a virtual visit with your provider today.    Just as we do with appointments in the office, we must obtain your consent to participate.  Your consent will be active for this visit and any virtual visit you may have with one of our providers in the next 365 days.    If you have a MyChart account, I can also send a copy of this consent to you electronically.  All virtual visits are billed to your insurance company just like a traditional visit in the office.  As this is a virtual visit, video technology does not allow for your provider to perform a traditional examination.  This may limit your provider's ability to fully assess your condition.  If your provider identifies any concerns that need to be evaluated in person or the need to arrange testing such as labs, EKG, etc, we will make  arrangements to do so.    Although advances in technology are sophisticated, we cannot ensure that it will always work on either your end or our end.  If the connection with a video visit is poor, we may have to switch to a telephone visit.  With either a video or telephone visit, we are not always able to ensure that we have a secure connection.   I need to obtain your verbal consent now.   Are you willing to proceed with your visit today?   Valerie [name] has provided verbal consent on 08/18/2022 for a virtual visit (video or telephone).   Evelina Dun, [place] 08/18/2022  10:49 AM    History and Present Illness:  Pt and son call today with lower back pain. She sent to the ED on 05/12/22 and was diagnosed with MSK pain. She was given Ultram. She reports this was too strong and did help. She was then seen 07/02/22 and was prednisone. Reports this helped mildly. She had a telephone visit on 07/07/22 for constipation and back pain. Was given Miralax with mild relief.  Back Pain This is a new problem. The current episode started more than 1 month ago. The problem occurs intermittently. The problem has been waxing and waning since onset. The pain is present in the lumbar spine. The quality of the pain is described as aching. The pain is at a severity of 10/10. The pain is moderate. The symptoms are aggravated by twisting.     Review of Systems  Musculoskeletal:  Positive for back pain.     Observations/Objective: No SOB or distress noted   Assessment and Plan: 1. Lumbar back pain - traMADol (ULTRAM) 50 MG tablet; Take 1 tablet (50 mg total) by mouth every 12 (twelve) hours as needed for up to 10 days.  Dispense: 20 tablet; Refill: 0  2. Constipation due to opioid therapy  Long discussion with patient about pain management. Does not want Ortho referral at this time.  Discussed sedation precautions  Continue Miralax daily for constipation Follow up with PCP     I discussed the  assessment and treatment plan with the patient. The patient was provided an opportunity to ask questions and all were answered. The patient agreed with the plan and demonstrated an understanding of the instructions.   The patient was advised to call back or seek an in-person evaluation if the symptoms worsen or if the condition fails to improve as anticipated.  The above assessment and management plan was discussed with the patient. The patient verbalized understanding of and has agreed to the management plan. Patient is aware to call the clinic if symptoms persist or worsen. Patient is aware when to return to the clinic for a follow-up visit. Patient educated on when it is appropriate to go to the emergency department.     I provided 24 minutes of  non face-to-face time during this encounter.    Evelina Dun, FNP

## 2022-08-19 ENCOUNTER — Telehealth: Payer: Medicare PPO

## 2022-08-30 ENCOUNTER — Emergency Department (HOSPITAL_COMMUNITY): Payer: Medicare PPO

## 2022-08-30 ENCOUNTER — Observation Stay (HOSPITAL_COMMUNITY)
Admission: EM | Admit: 2022-08-30 | Discharge: 2022-08-31 | Disposition: A | Discharge status: Home Health | Payer: Medicare PPO | Attending: Family Medicine | Admitting: Family Medicine

## 2022-08-30 ENCOUNTER — Other Ambulatory Visit: Payer: Self-pay

## 2022-08-30 ENCOUNTER — Encounter (HOSPITAL_COMMUNITY): Payer: Self-pay

## 2022-08-30 ENCOUNTER — Observation Stay (HOSPITAL_BASED_OUTPATIENT_CLINIC_OR_DEPARTMENT_OTHER): Payer: Medicare PPO

## 2022-08-30 DIAGNOSIS — G319 Degenerative disease of nervous system, unspecified: Secondary | ICD-10-CM | POA: Diagnosis not present

## 2022-08-30 DIAGNOSIS — W19XXXA Unspecified fall, initial encounter: Secondary | ICD-10-CM

## 2022-08-30 DIAGNOSIS — Z96641 Presence of right artificial hip joint: Secondary | ICD-10-CM | POA: Insufficient documentation

## 2022-08-30 DIAGNOSIS — R55 Syncope and collapse: Secondary | ICD-10-CM | POA: Diagnosis not present

## 2022-08-30 DIAGNOSIS — I10 Essential (primary) hypertension: Secondary | ICD-10-CM | POA: Diagnosis not present

## 2022-08-30 DIAGNOSIS — I2510 Atherosclerotic heart disease of native coronary artery without angina pectoris: Secondary | ICD-10-CM | POA: Insufficient documentation

## 2022-08-30 DIAGNOSIS — E039 Hypothyroidism, unspecified: Secondary | ICD-10-CM | POA: Insufficient documentation

## 2022-08-30 DIAGNOSIS — S2241XA Multiple fractures of ribs, right side, initial encounter for closed fracture: Secondary | ICD-10-CM | POA: Diagnosis not present

## 2022-08-30 DIAGNOSIS — S2239XA Fracture of one rib, unspecified side, initial encounter for closed fracture: Secondary | ICD-10-CM | POA: Diagnosis present

## 2022-08-30 DIAGNOSIS — I480 Paroxysmal atrial fibrillation: Secondary | ICD-10-CM | POA: Diagnosis not present

## 2022-08-30 DIAGNOSIS — Z7901 Long term (current) use of anticoagulants: Secondary | ICD-10-CM | POA: Diagnosis not present

## 2022-08-30 DIAGNOSIS — Z79899 Other long term (current) drug therapy: Secondary | ICD-10-CM | POA: Insufficient documentation

## 2022-08-30 DIAGNOSIS — Z043 Encounter for examination and observation following other accident: Secondary | ICD-10-CM | POA: Diagnosis not present

## 2022-08-30 DIAGNOSIS — R002 Palpitations: Secondary | ICD-10-CM

## 2022-08-30 DIAGNOSIS — I4891 Unspecified atrial fibrillation: Secondary | ICD-10-CM | POA: Diagnosis present

## 2022-08-30 DIAGNOSIS — R0789 Other chest pain: Secondary | ICD-10-CM | POA: Diagnosis not present

## 2022-08-30 DIAGNOSIS — M5030 Other cervical disc degeneration, unspecified cervical region: Secondary | ICD-10-CM | POA: Diagnosis not present

## 2022-08-30 DIAGNOSIS — I351 Nonrheumatic aortic (valve) insufficiency: Secondary | ICD-10-CM | POA: Diagnosis present

## 2022-08-30 DIAGNOSIS — R079 Chest pain, unspecified: Secondary | ICD-10-CM | POA: Diagnosis not present

## 2022-08-30 DIAGNOSIS — E0590 Thyrotoxicosis, unspecified without thyrotoxic crisis or storm: Secondary | ICD-10-CM | POA: Diagnosis present

## 2022-08-30 LAB — CBC
HCT: 38.1 % (ref 36.0–46.0)
Hemoglobin: 13 g/dL (ref 12.0–15.0)
MCH: 34 pg (ref 26.0–34.0)
MCHC: 34.1 g/dL (ref 30.0–36.0)
MCV: 99.7 fL (ref 80.0–100.0)
Platelets: 339 10*3/uL (ref 150–400)
RBC: 3.82 MIL/uL — ABNORMAL LOW (ref 3.87–5.11)
RDW: 12.5 % (ref 11.5–15.5)
WBC: 11 10*3/uL — ABNORMAL HIGH (ref 4.0–10.5)
nRBC: 0 % (ref 0.0–0.2)

## 2022-08-30 LAB — BASIC METABOLIC PANEL
Anion gap: 8 (ref 5–15)
BUN: 21 mg/dL (ref 8–23)
CO2: 23 mmol/L (ref 22–32)
Calcium: 8.9 mg/dL (ref 8.9–10.3)
Chloride: 109 mmol/L (ref 98–111)
Creatinine, Ser: 0.8 mg/dL (ref 0.44–1.00)
GFR, Estimated: >60 mL/min (ref >60)
Glucose, Bld: 105 mg/dL — ABNORMAL HIGH (ref 70–99)
Potassium: 3.6 mmol/L (ref 3.5–5.1)
Sodium: 140 mmol/L (ref 135–145)

## 2022-08-30 LAB — ECHOCARDIOGRAM COMPLETE
Area-P 1/2: 2.24 cm2
Calc EF: 62.9 %
Height: 60 in
P 1/2 time: 370 msec
S' Lateral: 2 cm
Single Plane A2C EF: 60 %
Single Plane A4C EF: 67.5 %
Weight: 1552 oz

## 2022-08-30 LAB — TROPONIN I (HIGH SENSITIVITY)
Troponin I (High Sensitivity): 7 ng/L (ref <18)
Troponin I (High Sensitivity): 10 ng/L (ref <18)

## 2022-08-30 LAB — TSH: TSH: 3.072 u[IU]/mL (ref 0.350–4.500)

## 2022-08-30 MED ORDER — ACETAMINOPHEN 500 MG PO TABS
1000.0000 mg | ORAL_TABLET | Freq: Once | ORAL | Status: AC
  Administered 2022-08-30: 1000 mg via ORAL
  Filled 2022-08-30: qty 2

## 2022-08-30 MED ORDER — SODIUM CHLORIDE 0.9% FLUSH
3.0000 mL | Freq: Two times a day (BID) | INTRAVENOUS | Status: DC
  Administered 2022-08-30 – 2022-08-31 (×2): 3 mL via INTRAVENOUS

## 2022-08-30 MED ORDER — POLYVINYL ALCOHOL 1.4 % OP SOLN: 1.0000 [drp] | OPHTHALMIC | Status: DC | PRN

## 2022-08-30 MED ORDER — OXYCODONE HCL 5 MG PO TABS
5.0000 mg | ORAL_TABLET | ORAL | Status: DC | PRN
  Administered 2022-08-30 – 2022-08-31 (×2): 5 mg via ORAL
  Filled 2022-08-30 (×2): qty 1

## 2022-08-30 MED ORDER — FENTANYL CITRATE PF 50 MCG/ML IJ SOSY: 25.0000 ug | PREFILLED_SYRINGE | INTRAMUSCULAR | Status: DC | PRN

## 2022-08-30 MED ORDER — ACETAMINOPHEN 325 MG PO TABS: 650.0000 mg | ORAL_TABLET | Freq: Four times a day (QID) | ORAL | Status: DC | PRN

## 2022-08-30 MED ORDER — METHOCARBAMOL 500 MG PO TABS
500.0000 mg | ORAL_TABLET | Freq: Three times a day (TID) | ORAL | Status: DC
  Administered 2022-08-30 (×3): 500 mg via ORAL
  Filled 2022-08-30 (×4): qty 1

## 2022-08-30 MED ORDER — ONDANSETRON HCL 4 MG PO TABS: 4.0000 mg | ORAL_TABLET | Freq: Four times a day (QID) | ORAL | Status: DC | PRN

## 2022-08-30 MED ORDER — SODIUM CHLORIDE 0.9% FLUSH
3.0000 mL | Freq: Two times a day (BID) | INTRAVENOUS | Status: DC
  Administered 2022-08-30: 3 mL via INTRAVENOUS

## 2022-08-30 MED ORDER — ONDANSETRON HCL 4 MG/2ML IJ SOLN: 4.0000 mg | Freq: Four times a day (QID) | INTRAMUSCULAR | Status: DC | PRN

## 2022-08-30 MED ORDER — HYDRALAZINE HCL 20 MG/ML IJ SOLN: 10.0000 mg | Freq: Four times a day (QID) | INTRAMUSCULAR | Status: DC | PRN

## 2022-08-30 MED ORDER — SODIUM CHLORIDE 0.9 % IV SOLN: INTRAVENOUS | Status: DC | PRN

## 2022-08-30 MED ORDER — METHIMAZOLE 5 MG PO TABS
5.0000 mg | ORAL_TABLET | Freq: Every day | ORAL | Status: DC
  Administered 2022-08-30: 5 mg via ORAL
  Filled 2022-08-30 (×3): qty 1

## 2022-08-30 MED ORDER — BISACODYL 10 MG RE SUPP: 10.0000 mg | Freq: Every day | RECTAL | Status: DC | PRN

## 2022-08-30 MED ORDER — TRAZODONE HCL 50 MG PO TABS: 50.0000 mg | ORAL_TABLET | Freq: Every evening | ORAL | Status: DC | PRN

## 2022-08-30 MED ORDER — FENTANYL CITRATE PF 50 MCG/ML IJ SOSY: 50.0000 ug | PREFILLED_SYRINGE | INTRAMUSCULAR | Status: DC | PRN

## 2022-08-30 MED ORDER — APIXABAN 2.5 MG PO TABS: 2.5000 mg | ORAL_TABLET | Freq: Two times a day (BID) | ORAL | Status: DC

## 2022-08-30 MED ORDER — POLYETHYLENE GLYCOL 3350 17 G PO PACK
17.0000 g | PACK | Freq: Every day | ORAL | Status: DC
  Administered 2022-08-30: 17 g via ORAL
  Filled 2022-08-30 (×2): qty 1

## 2022-08-30 MED ORDER — PANTOPRAZOLE SODIUM 40 MG PO TBEC
40.0000 mg | DELAYED_RELEASE_TABLET | Freq: Every day | ORAL | Status: DC
  Administered 2022-08-30: 40 mg via ORAL
  Filled 2022-08-30 (×2): qty 1

## 2022-08-30 MED ORDER — CARVEDILOL 3.125 MG PO TABS
6.2500 mg | ORAL_TABLET | Freq: Two times a day (BID) | ORAL | Status: DC
  Administered 2022-08-30: 6.25 mg via ORAL
  Filled 2022-08-30 (×2): qty 2

## 2022-08-30 MED ORDER — TIZANIDINE HCL 4 MG PO TABS
4.0000 mg | ORAL_TABLET | Freq: Once | ORAL | Status: AC
  Administered 2022-08-30: 4 mg via ORAL
  Filled 2022-08-30: qty 1

## 2022-08-30 MED ORDER — LIDOCAINE 5 % EX PTCH
1.0000 | MEDICATED_PATCH | CUTANEOUS | Status: DC
  Administered 2022-08-30: 1 via TRANSDERMAL
  Filled 2022-08-30 (×2): qty 1

## 2022-08-30 MED ORDER — SODIUM CHLORIDE 0.9% FLUSH: 3.0000 mL | INTRAVENOUS | Status: DC | PRN

## 2022-08-30 MED ORDER — PRAVASTATIN SODIUM 10 MG PO TABS
10.0000 mg | ORAL_TABLET | Freq: Every day | ORAL | Status: DC
  Administered 2022-08-30: 10 mg via ORAL
  Filled 2022-08-30 (×2): qty 1

## 2022-08-30 NOTE — ED Triage Notes (Signed)
Pt arrived from home via [facility] s/p unwitnessed fall on thinners with chest pain 7/10 on pain scale

## 2022-08-30 NOTE — ED Provider Notes (Signed)
[facility] Emergency Department Provider Note MRN:  [id number]  Arrival date & time: 08/30/22     Chief Complaint   Fall and Chest Pain   History of Present Illness   Valerie [name] is a 88 y.o. year-old female with a history of carotid artery disease presenting to the ED with chief complaint of fall and chest pain.  Patient was sitting in bed when she began experiencing palpitations, felt like her heart was racing.  Also felt a mild chest pressure.  She decided to take some nitroglycerin tablets.  After the second tablet, she does not remember what happened but she ended up on the ground.  She is endorsing some soreness to the right ribs from the fall.  She denies head trauma or loss of consciousness, no neck or back pain, no further palpitations or chest pressure, no abdominal pain, no injuries to the arms or legs.  Review of Systems  A thorough review of systems was obtained and all systems are negative except as noted in the HPI and PMH.   Patient's Health History    Past Medical History:  Diagnosis Date   Anxiety    Arthritis    Broken ribs    Carotid artery disease ([place])    Cataract    Collar bone fracture    Right   Depression    Diverticulosis of colon with hemorrhage    Duodenitis    Esophageal reflux    Esophageal stricture    Essential hypertension    Glaucoma    Hiatal hernia    History of kidney stones     x1    Hyperlipidemia    IBS (irritable bowel syndrome)    Palpitations    UTI (urinary tract infection) March 2015    Past Surgical History:  Procedure Laterality Date   CHOLECYSTECTOMY     COLONOSCOPY     DILATION AND CURETTAGE OF UTERUS     EYE SURGERY Bilateral    total of 5   HIP ARTHROPLASTY Right 03/16/2021   Procedure: POSTERIOR ARTHROPLASTY BIPOLAR HIP (HEMIARTHROPLASTY);  Surgeon: [name], MD;  Location: [facility];  Service: Orthopedics;  Laterality: Right;   LASER PHOTO ABLATION Right 10/20/2016   Procedure: LASER  PHOTO ABLATION;  Surgeon: Hayden Pedro, MD;  Location: [facility];  Service: Ophthalmology;  Laterality: Right;  Headscope laser   LEFT ROTATOR CUFF REPAIR X2 Left    PARS PLANA VITRECTOMY  10/20/2016   WITH 25G REMOVAL/SUTURE SECONDARY INTRAOCULAR LENS, GAS FLUID EXCHANGE    PARS PLANA VITRECTOMY Right 10/20/2016   Procedure: PARS PLANA VITRECTOMY WITH 25G REMOVAL/SUTURE SECONDARY INTRAOCULAR LENS, GAS FLUID EXCHANGE;  Surgeon: Hayden Pedro, MD;  Location: [facility];  Service: Ophthalmology;  Laterality: Right;   RIGHT ROTATOR CUFF REPAIR X1 Right    UPPER GASTROINTESTINAL ENDOSCOPY     with dilation    Family History  Problem Relation Age of Onset   Leukemia Mother    Colon cancer Father 49   Kidney cancer Brother    Bladder Cancer Brother    Stroke Sister    Esophageal cancer Neg Hx    Stomach cancer Neg Hx     Social History   Socioeconomic History   Marital status: Widowed    Spouse name: Not on file   Number of children: 4   Years of education: Not on file   Highest education level: Not on file  Occupational History   Occupation: retired  Employer: RETIRED  Tobacco Use   Smoking status: Never   Smokeless tobacco: Never  Vaping Use   Vaping Use: Never used  Substance and Sexual Activity   Alcohol use: No   Drug use: No   Sexual activity: Not on file  Other Topics Concern   Not on file  Social History Narrative   Her grandson lives with her   She is legally blind   Social Determinants of Health   Financial Resource Strain: Medium Risk (07/25/2021)   Overall Financial Resource Strain (CARDIA)    Difficulty of Paying Living Expenses: Somewhat hard  Food Insecurity: No Food Insecurity (07/25/2021)   Hunger Vital Sign    Worried About Running Out of Food in the Last Year: Never true    Ran Out of Food in the Last Year: Never true  Transportation Needs: No Transportation Needs (07/25/2021)   PRAPARE - Hydrologist (Medical): No     Lack of Transportation (Non-Medical): No  Physical Activity: Insufficiently Active (07/25/2021)   Exercise Vital Sign    Days of Exercise per Week: 7 days    Minutes of Exercise per Session: 10 min  Stress: Stress Concern Present (07/25/2021)   [place]    Feeling of Stress : Rather much  Social Connections: Moderately Integrated (07/25/2021)   Social Connection and Isolation Panel [NHANES]    Frequency of Communication with Friends and Family: More than three times a week    Frequency of Social Gatherings with Friends and Family: More than three times a week    Attends Religious Services: More than 4 times per year    Active Member of [employer] or Organizations: Yes    Attends Archivist Meetings: More than 4 times per year    Marital Status: Widowed  Intimate Partner Violence: Not At Risk (07/25/2021)   Humiliation, Afraid, Rape, and Kick questionnaire    Fear of Current or Ex-Partner: No    Emotionally Abused: No    Physically Abused: No    Sexually Abused: No     Physical Exam   Vitals:   08/30/22 0630 08/30/22 0633  BP: (!) 173/64   Pulse: 77 75  Resp: 18 (!) 21  Temp: 98.4 F (36.9 C)   SpO2: 96% 95%    CONSTITUTIONAL: Well-appearing, NAD NEURO/PSYCH:  Alert and oriented x 3, no focal deficits EYES:  eyes equal and reactive ENT/NECK:  no LAD, no JVD CARDIO: Regular rate, well-perfused, normal S1 and S2 PULM:  CTAB no wheezing or rhonchi GI/GU:  non-distended, non-tender MSK/SPINE:  No gross deformities, no edema SKIN:  no rash, atraumatic   *Additional and/or pertinent findings included in MDM below  Diagnostic and Interventional Summary    EKG Interpretation  Date/Time:  Sunday August 30 2022 06:33:32 EST Ventricular Rate:  78 PR Interval:  163 QRS Duration: 86 QT Interval:  405 QTC Calculation: 462 R Axis:   65 Text Interpretation: Sinus rhythm Baseline wander in lead(s) V5  Confirmed by Gerlene Fee [id number]) on 08/30/2022 6:53:10 AM       Labs Reviewed  CBC  BASIC METABOLIC PANEL  TROPONIN I (HIGH SENSITIVITY)    CT HEAD WO CONTRAST (5MM)    (Results Pending)  CT CERVICAL SPINE WO CONTRAST    (Results Pending)  DG Chest Port 1 View    (Results Pending)    Medications - No data to display   Procedures  /  Critical Care Procedures  ED Course and Medical Decision Making  Initial Impression and Ddx Palpitations, mild chest pain, differential diagnosis includes arrhythmia, A-fib with RVR, ACS.  Patient then took some nitroglycerin tablets and then passed out.  Suspect this is due to the associated blood pressure changes.  Considering rib fractures from the fall given patient's lateral rib pain.  She is anticoagulated and so we will also obtain CT head to exclude intracranial bleeding.  Otherwise nontraumatic exam.  Awaiting labs, imaging and troponin.  Past medical/surgical history that increases complexity of ED encounter: Carotid artery disease  Interpretation of Diagnostics I personally reviewed the EKG and my interpretation is as follows: Sinus rhythm, no significant change from prior  Labs imaging pending  Patient Reassessment and Ultimate Disposition/Management     Signed out to oncoming provider, patient wishes to go home if workup is reassuring.  Patient management required discussion with the following services or consulting groups:  None  Complexity of Problems Addressed Acute illness or injury that poses threat of life of bodily function  Additional Data Reviewed and Analyzed Further history obtained from: Further history from spouse/family member  Additional Factors Impacting ED Encounter Risk Consideration of hospitalization  Barth Kirks. Sedonia Small, MD [place] [email or web address]  Final Clinical Impressions(s) / ED Diagnoses     ICD-10-CM   1. Chest pain, unspecified type  R07.9      2. Syncope, unspecified syncope type  R55     3. Fall, initial encounter  W19.XXXA     4. Palpitations  R00.2       ED Discharge Orders     None        Discharge Instructions Discussed with and Provided to Patient:   Discharge Instructions   None      Maudie Flakes, MD 08/30/22 [phone]

## 2022-08-30 NOTE — ED Notes (Signed)
Called patient's daughter [name] updates given

## 2022-08-30 NOTE — Discharge Instructions (Addendum)
1)Oxycodone and other pain medications can cause confusion, disorientation and constipation----so Please use oxycodone for pain control very sparingly  2)Please Avoid constipation  3) please note that your blood pressure medication and other medications have been adjusted  4)A physical therapist to come to the house to help you get stronger and improve your gait instability

## 2022-08-30 NOTE — ED Notes (Signed)
Pt ambulated to bathroom in ER room with minimal assist.

## 2022-08-30 NOTE — H&P (Signed)
Patient Demographics:    Valerie [name], is a 88 y.o. female  MRN: [id number]   DOB - 1928-02-25  Admit Date - 08/30/2022  Outpatient Primary MD for the patient is Chevis Pretty, FNP   Assessment & Plan:   Assessment and Plan:  1) syncope/status post fall--- suspect this is due to nitroglycerin induced hypotension -CT head and CT C-spine without acute findings -Patient endorses palpitations and dizziness since he took nitroglycerin x 2 tablets and probably experienced hypotension and then fell fracturing her ribs -Patient endorses loss of consciousness and poor recall of events after she passed out -Troponin 7 >>10 -EKG sinus rhythm without acute findings --Glucose 105 -Echo today with EF of 65 to 70% with grade 1 diastolic dysfunction with moderate aortic insufficiency  2)Rt sided Rib Fx--- Dedicated rib x-rays shows Acute minimally displaced fractures of the lateral right third through seventh ribs. -Also relaxants pain medications as ordered -Incentive spirometry -Physical therapy eval  3)PAFib--continue Eliquis for stroke prophylaxis, Coreg for rate control  4)H/o moderate aortic insufficiency--- Per cardiology notes manage medically  5)Carotid artery stenosis/PAD--continue Eliquis and pravastatin  6)Hypothyroidism--- continue methimazole and Coreg -TSH 3.0  7)HTN-restart Coreg 6.5 mg twice daily -IV hydralazine as needed elevated BP  8) social/ethics ---she  lives alone but has family living next door and they check on her frequently --Additional history obtained from patient's son Claiborne Billings at bedside -Await PT eval to determine disposition  Dispo: The patient is from: Home              Anticipated d/c is to: Home with [facility]               Anticipated d/c date is: 1 day              Patient currently  is not medically stable to d/c. Barriers: Not Clinically Stable-    With History of - Reviewed by me  Past Medical History:  Diagnosis Date   Anxiety    Arthritis    Broken ribs    Carotid artery disease ([place])    Cataract    Collar bone fracture    Right   Depression    Diverticulosis of colon with hemorrhage    Duodenitis    Esophageal reflux    Esophageal stricture    Essential hypertension    Glaucoma    Hiatal hernia    History of kidney stones     x1    Hyperlipidemia    IBS (irritable bowel syndrome)    Palpitations    UTI (urinary tract infection) March 2015      Past Surgical History:  Procedure Laterality Date   CHOLECYSTECTOMY     COLONOSCOPY     DILATION AND CURETTAGE OF UTERUS     EYE SURGERY Bilateral    total of 5   HIP ARTHROPLASTY Right 03/16/2021   Procedure: POSTERIOR ARTHROPLASTY BIPOLAR HIP (HEMIARTHROPLASTY);  Surgeon: [name], MD;  Location:  [place];  Service: Orthopedics;  Laterality: Right;   LASER PHOTO ABLATION Right 10/20/2016   Procedure: LASER PHOTO ABLATION;  Surgeon: Hayden Pedro, MD;  Location: [place];  Service: Ophthalmology;  Laterality: Right;  Headscope laser   LEFT ROTATOR CUFF REPAIR X2 Left    PARS PLANA VITRECTOMY  10/20/2016   WITH 25G REMOVAL/SUTURE SECONDARY INTRAOCULAR LENS, GAS FLUID EXCHANGE    PARS PLANA VITRECTOMY Right 10/20/2016   Procedure: PARS PLANA VITRECTOMY WITH 25G REMOVAL/SUTURE SECONDARY INTRAOCULAR LENS, GAS FLUID EXCHANGE;  Surgeon: Hayden Pedro, MD;  Location: [place];  Service: Ophthalmology;  Laterality: Right;   RIGHT ROTATOR CUFF REPAIR X1 Right    UPPER GASTROINTESTINAL ENDOSCOPY     with dilation    Chief Complaint  Patient presents with   Fall   Chest Pain      HPI:    Valerie [name]  is a 88 y.o. female with PMHx relevant for HTN, HLD, anxiety, depression, GERD, CAD, glaucoma-underwent intervention , chronic back pain/arthritis, opiate induced constipation concerns, atrial fibrillation,  moderate to severe aortic insufficiency, carotid artery stenosis/PAD she  lives alone but has family living next door and they check on her frequently presents to the ED by EMS after unwitnessed fall while on Eliquis--- complains of chest pain and is found to have rib fractures -Patient endorses palpitations and dizziness since he took nitroglycerin x 2 tablets and probably experienced hypotension and then fell fracturing her ribs -Patient endorses loss of consciousness and poor recall of events after she passed out -Additional history obtained from patient's son Claiborne Billings at bedside -Dedicated rib x-rays shows Acute minimally displaced fractures of the lateral right third through seventh ribs. -CT head and CT C-spine without acute findings -Plain film chest x-ray without acute cardiopulmonary findings -TSH 3.0 -Creatinine 0.8, electrolytes WNL -Glucose 105 Troponin 7 >>10 -EKG sinus rhythm without acute findings   Review of systems:    In addition to the HPI above,   A full Review of  Systems was done, all other systems reviewed are negative except as noted above in HPI , .    Social History:  Reviewed by me    Social History   Tobacco Use   Smoking status: Never   Smokeless tobacco: Never  Substance Use Topics   Alcohol use: No     Family History :  Reviewed by me    Family History  Problem Relation Age of Onset   Leukemia Mother    Colon cancer Father 2   Kidney cancer Brother    Bladder Cancer Brother    Stroke Sister    Esophageal cancer Neg Hx    Stomach cancer Neg Hx     Home Medications:   Prior to Admission medications   Medication Sig Start Date End Date Taking? Authorizing Provider  amLODipine (NORVASC) 10 MG tablet Take 1 tablet (10 mg total) by mouth daily. 07/27/22  Yes Hassell Done, Mary-Margaret, FNP  apixaban (ELIQUIS) 2.5 MG TABS tablet TAKE  (1)  TABLET TWICE A DAY. 07/27/22  Yes Martin, Mary-Margaret, FNP  carvedilol (COREG) 3.125 MG tablet TAKE  (1)   TABLET TWICE A DAY WITH MEALS (BREAKFAST AND SUPPER) 07/27/22  Yes Hassell Done, Mary-Margaret, FNP  Cholecalciferol (VITAMIN D3) 1.25 MG (50000 UT) TABS Take 1 capsule by mouth every 7 (seven) days. Monday   Yes [provider]  fluticasone (FLONASE) 50 MCG/ACT nasal spray Place 2 sprays into both nostrils as needed for allergies or rhinitis. 11/20/20  Yes Hassell Done,  Mary-Margaret, FNP  losartan (COZAAR) 100 MG tablet Take 1 tablet (100 mg total) by mouth daily. Patient taking differently: Take 50 mg by mouth daily. 07/27/22  Yes Martin, Mary-Margaret, FNP  methimazole (TAPAZOLE) 5 MG tablet Take 1 tablet (5 mg total) by mouth daily. 12/02/21  Yes Shamleffer, Melanie Crazier, MD  nitroGLYCERIN (NITROSTAT) 0.4 MG SL tablet PLACE 1 TABLET UNDER THE TONGUE AT ONSET OF CHEST PAIN EVERY 5 MINTUES UP TO 3 TIMES AS NEEDED 01/23/22  Yes Hassell Done, Mary-Margaret, FNP  pantoprazole (PROTONIX) 40 MG tablet Take 1 tablet (40 mg total) by mouth daily. 07/27/22  Yes Martin, Mary-Margaret, FNP  polyvinyl alcohol (LIQUIFILM TEARS) 1.4 % ophthalmic solution Place 1 drop into both eyes as needed for dry eyes.   Yes [provider]  pravastatin (PRAVACHOL) 10 MG tablet Take 1 tablet (10 mg total) by mouth daily. 07/27/22  Yes Martin, Mary-Margaret, FNP  acetaminophen (TYLENOL) 325 MG tablet Take 650 mg by mouth every 6 (six) hours as needed for headache (pain).    [provider]  ondansetron (ZOFRAN) 4 MG tablet Take 1 tablet (4 mg total) by mouth every 8 (eight) hours as needed for nausea or vomiting. Patient not taking: Reported on 08/30/2022 06/25/22   Gwenlyn Perking, FNP  propranolol (INDERAL) 10 MG tablet Take 1 tablet by mouth up to four times daily as needed for palpitations Patient not taking: Reported on 08/30/2022 07/27/22   Chevis Pretty, FNP     Allergies:     Allergies  Allergen Reactions   Aricept [Donepezil Hcl] Other (See Comments)    Nightmares, near syncope, weak, decreased  appetite.   Lisinopril Other (See Comments)    Hair loss   Sulfa Antibiotics Nausea Only   Sulfasalazine Nausea Only     Physical Exam:   Vitals  Blood pressure (!) 178/68, pulse 75, temperature 98.2 F (36.8 C), temperature source Oral, resp. rate 19, height 5' (1.524 m), weight 44 kg, SpO2 93 %.  Physical Examination: General appearance - alert,  in no distress, and elderly appearing Mental status - alert, oriented to person, place, and time,  Eyes - sclera anicteric Neck - supple, no JVD elevation , Chest -slightly diminished breath sounds, no wheezing, right-sided chest discomfort with deep inspiration and positional change Heart - S1 and S2 normal, irregular, not irregularly irregular , 3/6 SM abdomen - soft, nontender, nondistended, +BS Neurological - screening mental status exam normal, neck supple without rigidity, cranial nerves II through XII intact, DTR's normal and symmetric Extremities - no pedal edema noted, intact peripheral pulses  Skin - warm, dry     Data Review:    CBC Recent Labs  Lab 08/30/22 0727  WBC 11.0*  HGB 13.0  HCT 38.1  PLT 339  MCV 99.7  MCH 34.0  MCHC 34.1  RDW 12.5   ------------------------------------------------------------------------------------------------------------------  Chemistries  Recent Labs  Lab 08/30/22 0727  NA 140  K 3.6  CL 109  CO2 23  GLUCOSE 105*  BUN 21  CREATININE 0.80  CALCIUM 8.9   ------------------------------------------------------------------------------------------------------------------ estimated creatinine clearance is 31.2 mL/min (by C-G formula based on SCr of 0.8 mg/dL). ------------------------------------------------------------------------------------------------------------------ Recent Labs    08/30/22 0727  TSH 3.072    ------------------------------------------------------------------------------------------------------------------    Component Value Date/Time   BNP 914.9  (H) 02/17/2019 1542   Urinalysis    Component Value Date/Time   COLORURINE YELLOW 01/13/2019 2120   APPEARANCEUR Clear 07/31/2022 0910   LABSPEC 1.014 01/13/2019 2120   PHURINE 6.0  01/13/2019 2120   GLUCOSEU Negative 07/31/2022 0910   HGBUR NEGATIVE 01/13/2019 2120   BILIRUBINUR Negative 07/31/2022 0910   KETONESUR NEGATIVE 01/13/2019 2120   PROTEINUR Negative 07/31/2022 0910   PROTEINUR NEGATIVE 01/13/2019 2120   UROBILINOGEN negative 11/08/2013 0908   UROBILINOGEN 0.2 11/30/2008 1105   NITRITE Negative 07/31/2022 0910   NITRITE NEGATIVE 01/13/2019 2120   LEUKOCYTESUR Negative 07/31/2022 0910   LEUKOCYTESUR NEGATIVE 01/13/2019 2120    ----------------------------------------------------------------------------------------------------------------   Imaging Results:    ECHOCARDIOGRAM COMPLETE  Result Date: 08/30/2022    ECHOCARDIOGRAM REPORT   Patient Name:   Valerie [name] Date of Exam: 08/30/2022 Medical Rec #:  [id number]    Height:       60.0 in Accession #:    [id number]   Weight:       97.0 lb Date of Birth:  09/18/1928     BSA:          1.372 m Patient Age:    89 years     BP:           112/50 mmHg Patient Gender: F            HR:           64 bpm. Exam Location:  [facility] Procedure: 2D Echo, Cardiac Doppler and Color Doppler Indications:    R55 Syncope  History:        Patient has prior history of Echocardiogram examinations, most                 recent 01/14/2019. Signs/Symptoms:Syncope; Risk                 Factors:Dyslipidemia and Hypertension.  Sonographer:    Bernadene Person RDCS Referring Phys: [place]  1. Left ventricular ejection fraction, by estimation, is 65 to 70%. The left ventricle has normal function. The left ventricle has no regional wall motion abnormalities. Left ventricular diastolic parameters are consistent with Grade I diastolic dysfunction (impaired relaxation). Elevated left atrial pressure.  2. Right ventricular systolic function is  normal. The right ventricular size is normal. There is normal pulmonary artery systolic pressure.  3. Mild mitral valve regurgitation. Moderate mitral annular calcification.  4. The aortic valve is tricuspid. Aortic valve regurgitation is mild to moderate. Comparison(s): The left ventricular function is unchanged. FINDINGS  Left Ventricle: Left ventricular ejection fraction, by estimation, is 65 to 70%. The left ventricle has normal function. The left ventricle has no regional wall motion abnormalities. The left ventricular internal cavity size was small. There is no left ventricular hypertrophy. Left ventricular diastolic parameters are consistent with Grade I diastolic dysfunction (impaired relaxation). Elevated left atrial pressure. Right Ventricle: The right ventricular size is normal. Right vetricular wall thickness was not assessed. Right ventricular systolic function is normal. There is normal pulmonary artery systolic pressure. The tricuspid regurgitant velocity is 2.13 m/s, and with an assumed right atrial pressure of 3 mmHg, the estimated right ventricular systolic pressure is 44.3 mmHg. Left Atrium: Left atrial size was normal in size. Right Atrium: Right atrial size was normal in size. Pericardium: Trivial pericardial effusion is present. Mitral Valve: There is mild thickening of the mitral valve leaflet(s). There is mild calcification of the mitral valve leaflet(s). Moderate mitral annular calcification. Mild mitral valve regurgitation. Tricuspid Valve: The tricuspid valve is normal in structure. Tricuspid valve regurgitation is mild. Aortic Valve: The aortic valve is tricuspid. Aortic valve regurgitation is mild to moderate. Aortic regurgitation PHT measures 370  msec. Pulmonic Valve: The pulmonic valve was not well visualized. Pulmonic valve regurgitation is not visualized. Aorta: The aortic root and ascending aorta are structurally normal, with no evidence of dilitation. IAS/Shunts: No atrial level  shunt detected by color flow Doppler.  LEFT VENTRICLE PLAX 2D LVIDd:         3.00 cm     Diastology LVIDs:         2.00 cm     LV e' medial:    3.71 cm/s LV PW:         0.80 cm     LV E/e' medial:  18.7 LV IVS:        0.90 cm     LV e' lateral:   4.74 cm/s LVOT diam:     1.90 cm     LV E/e' lateral: 14.6 LV SV:         83 LV SV Index:   61 LVOT Area:     2.84 cm  LV Volumes (MOD) LV vol d, MOD A2C: 56.0 ml LV vol d, MOD A4C: 61.3 ml LV vol s, MOD A2C: 22.4 ml LV vol s, MOD A4C: 19.9 ml LV SV MOD A2C:     33.6 ml LV SV MOD A4C:     61.3 ml LV SV MOD BP:      38.1 ml RIGHT VENTRICLE RV S prime:     14.10 cm/s TAPSE (M-mode): 2.1 cm LEFT ATRIUM             Index        RIGHT ATRIUM           Index LA diam:        3.20 cm 2.33 cm/m   RA Area:     12.30 cm LA Vol (A2C):   37.3 ml 27.18 ml/m  RA Volume:   25.70 ml  18.73 ml/m LA Vol (A4C):   43.7 ml 31.84 ml/m LA Biplane Vol: 42.2 ml 30.75 ml/m  AORTIC VALVE LVOT Vmax:   110.00 cm/s LVOT Vmean:  74.900 cm/s LVOT VTI:    0.294 m AI PHT:      370 msec  AORTA Ao Root diam: 2.80 cm Ao Asc diam:  2.70 cm MITRAL VALVE                TRICUSPID VALVE MV Area (PHT): 2.24 cm     TR Peak grad:   18.1 mmHg MV Decel Time: 338 msec     TR Vmax:        213.00 cm/s MV E velocity: 69.20 cm/s MV A velocity: 149.00 cm/s  SHUNTS MV E/A ratio:  0.46         Systemic VTI:  0.29 m                             Systemic Diam: 1.90 cm Dorris Carnes MD Electronically signed by Dorris Carnes MD Signature Date/Time: 08/30/2022/4:06:27 PM    Final    DG Ribs Unilateral Right  Result Date: 08/30/2022 CLINICAL DATA:  88 year old female with history of trauma from a fall. Chest pain. EXAM: RIGHT RIBS - 2 VIEW COMPARISON:  Chest x-ray 08/30/2022. FINDINGS: Two views of the right ribs demonstrate acute minimally displaced fractures of the lateral right third, fourth, fifth, sixth and seventh ribs. Status post right shoulder arthroplasty. Surgical clips project over the right upper quadrant of the  abdomen, likely from prior cholecystectomy. Extensive aortic  atherosclerosis. IMPRESSION: 1. Acute minimally displaced fractures of the lateral right third through seventh ribs. Electronically Signed   By: Vinnie Langton M.D.   On: 08/30/2022 09:42   CT HEAD WO CONTRAST (5MM)  Result Date: 08/30/2022 CLINICAL DATA:  Unwitnessed fall today EXAM: CT HEAD WITHOUT CONTRAST CT CERVICAL SPINE WITHOUT CONTRAST TECHNIQUE: Multidetector CT imaging of the head and cervical spine was performed following the standard protocol without intravenous contrast. Multiplanar CT image reconstructions of the cervical spine were also generated. RADIATION DOSE REDUCTION: This exam was performed according to the departmental dose-optimization program which includes automated exposure control, adjustment of the mA and/or kV according to patient size and/or use of iterative reconstruction technique. COMPARISON:  None Available. FINDINGS: CT HEAD FINDINGS Brain: No evidence of acute infarction, hemorrhage, hydrocephalus, extra-axial collection or mass lesion/mass effect. Generalized atrophy with chronic small vessel ischemia in the cerebral white matter. Vascular: No hyperdense vessel or unexpected calcification. Skull: Negative for fracture.  Hyperostosis interna Sinuses/Orbits: No evidence of injury. Bilateral cataract resection and glaucoma reservoir. CT CERVICAL SPINE FINDINGS Alignment: No traumatic malalignment.  Dextrocurvature Skull base and vertebrae: Pronounced osteopenia that is generalized. No acute fracture. Soft tissues and spinal canal: No prevertebral fluid or swelling. No visible canal hematoma. Disc levels: Cervical fusion from C2-C4. Generalized cervical spine degeneration. No visible cord impingement Upper chest: No visible injury IMPRESSION: No evidence of acute intracranial or cervical spine injury. Electronically Signed   By: Jorje Guild M.D.   On: 08/30/2022 07:39   CT CERVICAL SPINE WO CONTRAST  Result  Date: 08/30/2022 CLINICAL DATA:  Unwitnessed fall today EXAM: CT HEAD WITHOUT CONTRAST CT CERVICAL SPINE WITHOUT CONTRAST TECHNIQUE: Multidetector CT imaging of the head and cervical spine was performed following the standard protocol without intravenous contrast. Multiplanar CT image reconstructions of the cervical spine were also generated. RADIATION DOSE REDUCTION: This exam was performed according to the departmental dose-optimization program which includes automated exposure control, adjustment of the mA and/or kV according to patient size and/or use of iterative reconstruction technique. COMPARISON:  None Available. FINDINGS: CT HEAD FINDINGS Brain: No evidence of acute infarction, hemorrhage, hydrocephalus, extra-axial collection or mass lesion/mass effect. Generalized atrophy with chronic small vessel ischemia in the cerebral white matter. Vascular: No hyperdense vessel or unexpected calcification. Skull: Negative for fracture.  Hyperostosis interna Sinuses/Orbits: No evidence of injury. Bilateral cataract resection and glaucoma reservoir. CT CERVICAL SPINE FINDINGS Alignment: No traumatic malalignment.  Dextrocurvature Skull base and vertebrae: Pronounced osteopenia that is generalized. No acute fracture. Soft tissues and spinal canal: No prevertebral fluid or swelling. No visible canal hematoma. Disc levels: Cervical fusion from C2-C4. Generalized cervical spine degeneration. No visible cord impingement Upper chest: No visible injury IMPRESSION: No evidence of acute intracranial or cervical spine injury. Electronically Signed   By: Jorje Guild M.D.   On: 08/30/2022 07:39   DG Chest Port 1 View  Result Date: 08/30/2022 CLINICAL DATA:  Chest pain EXAM: PORTABLE CHEST 1 VIEW COMPARISON:  05/12/2022 FINDINGS: Chronic interstitial coarsening. There is no edema, consolidation, effusion, or pneumothorax. Normal heart size. Aortic tortuosity. Artifact from EKG leads. Bilateral glenohumeral arthroplasty.  Generalized osteopenia. IMPRESSION: Stable exam.  No evidence of acute disease. Electronically Signed   By: Jorje Guild M.D.   On: 08/30/2022 07:23    Radiological Exams on Admission: ECHOCARDIOGRAM COMPLETE  Result Date: 08/30/2022    ECHOCARDIOGRAM REPORT   Patient Name:   Valerie [name] Date of Exam: 08/30/2022 Medical Rec #:  [id number]  Height:       60.0 in Accession #:    [id number]   Weight:       97.0 lb Date of Birth:  [birth year]     BSA:          1.372 m Patient Age:    63 years     BP:           112/50 mmHg Patient Gender: F            HR:           64 bpm. Exam Location:  [facility] Procedure: 2D Echo, Cardiac Doppler and Color Doppler Indications:    R55 Syncope  History:        Patient has prior history of Echocardiogram examinations, most                 recent 01/14/2019. Signs/Symptoms:Syncope; Risk                 Factors:Dyslipidemia and Hypertension.  Sonographer:    Bernadene Person RDCS Referring Phys: [place]  1. Left ventricular ejection fraction, by estimation, is 65 to 70%. The left ventricle has normal function. The left ventricle has no regional wall motion abnormalities. Left ventricular diastolic parameters are consistent with Grade I diastolic dysfunction (impaired relaxation). Elevated left atrial pressure.  2. Right ventricular systolic function is normal. The right ventricular size is normal. There is normal pulmonary artery systolic pressure.  3. Mild mitral valve regurgitation. Moderate mitral annular calcification.  4. The aortic valve is tricuspid. Aortic valve regurgitation is mild to moderate. Comparison(s): The left ventricular function is unchanged. FINDINGS  Left Ventricle: Left ventricular ejection fraction, by estimation, is 65 to 70%. The left ventricle has normal function. The left ventricle has no regional wall motion abnormalities. The left ventricular internal cavity size was small. There is no left ventricular hypertrophy. Left  ventricular diastolic parameters are consistent with Grade I diastolic dysfunction (impaired relaxation). Elevated left atrial pressure. Right Ventricle: The right ventricular size is normal. Right vetricular wall thickness was not assessed. Right ventricular systolic function is normal. There is normal pulmonary artery systolic pressure. The tricuspid regurgitant velocity is 2.13 m/s, and with an assumed right atrial pressure of 3 mmHg, the estimated right ventricular systolic pressure is 09.6 mmHg. Left Atrium: Left atrial size was normal in size. Right Atrium: Right atrial size was normal in size. Pericardium: Trivial pericardial effusion is present. Mitral Valve: There is mild thickening of the mitral valve leaflet(s). There is mild calcification of the mitral valve leaflet(s). Moderate mitral annular calcification. Mild mitral valve regurgitation. Tricuspid Valve: The tricuspid valve is normal in structure. Tricuspid valve regurgitation is mild. Aortic Valve: The aortic valve is tricuspid. Aortic valve regurgitation is mild to moderate. Aortic regurgitation PHT measures 370 msec. Pulmonic Valve: The pulmonic valve was not well visualized. Pulmonic valve regurgitation is not visualized. Aorta: The aortic root and ascending aorta are structurally normal, with no evidence of dilitation. IAS/Shunts: No atrial level shunt detected by color flow Doppler.  LEFT VENTRICLE PLAX 2D LVIDd:         3.00 cm     Diastology LVIDs:         2.00 cm     LV e' medial:    3.71 cm/s LV PW:         0.80 cm     LV E/e' medial:  18.7 LV IVS:        0.90 cm  LV e' lateral:   4.74 cm/s LVOT diam:     1.90 cm     LV E/e' lateral: 14.6 LV SV:         83 LV SV Index:   61 LVOT Area:     2.84 cm  LV Volumes (MOD) LV vol d, MOD A2C: 56.0 ml LV vol d, MOD A4C: 61.3 ml LV vol s, MOD A2C: 22.4 ml LV vol s, MOD A4C: 19.9 ml LV SV MOD A2C:     33.6 ml LV SV MOD A4C:     61.3 ml LV SV MOD BP:      38.1 ml RIGHT VENTRICLE RV S prime:     14.10  cm/s TAPSE (M-mode): 2.1 cm LEFT ATRIUM             Index        RIGHT ATRIUM           Index LA diam:        3.20 cm 2.33 cm/m   RA Area:     12.30 cm LA Vol (A2C):   37.3 ml 27.18 ml/m  RA Volume:   25.70 ml  18.73 ml/m LA Vol (A4C):   43.7 ml 31.84 ml/m LA Biplane Vol: 42.2 ml 30.75 ml/m  AORTIC VALVE LVOT Vmax:   110.00 cm/s LVOT Vmean:  74.900 cm/s LVOT VTI:    0.294 m AI PHT:      370 msec  AORTA Ao Root diam: 2.80 cm Ao Asc diam:  2.70 cm MITRAL VALVE                TRICUSPID VALVE MV Area (PHT): 2.24 cm     TR Peak grad:   18.1 mmHg MV Decel Time: 338 msec     TR Vmax:        213.00 cm/s MV E velocity: 69.20 cm/s MV A velocity: 149.00 cm/s  SHUNTS MV E/A ratio:  0.46         Systemic VTI:  0.29 m                             Systemic Diam: 1.90 cm Dorris Carnes MD Electronically signed by Dorris Carnes MD Signature Date/Time: 08/30/2022/4:06:27 PM    Final    DG Ribs Unilateral Right  Result Date: 08/30/2022 CLINICAL DATA:  88 year old female with history of trauma from a fall. Chest pain. EXAM: RIGHT RIBS - 2 VIEW COMPARISON:  Chest x-ray 08/30/2022. FINDINGS: Two views of the right ribs demonstrate acute minimally displaced fractures of the lateral right third, fourth, fifth, sixth and seventh ribs. Status post right shoulder arthroplasty. Surgical clips project over the right upper quadrant of the abdomen, likely from prior cholecystectomy. Extensive aortic atherosclerosis. IMPRESSION: 1. Acute minimally displaced fractures of the lateral right third through seventh ribs. Electronically Signed   By: Vinnie Langton M.D.   On: 08/30/2022 09:42   CT HEAD WO CONTRAST (5MM)  Result Date: 08/30/2022 CLINICAL DATA:  Unwitnessed fall today EXAM: CT HEAD WITHOUT CONTRAST CT CERVICAL SPINE WITHOUT CONTRAST TECHNIQUE: Multidetector CT imaging of the head and cervical spine was performed following the standard protocol without intravenous contrast. Multiplanar CT image reconstructions of the cervical spine  were also generated. RADIATION DOSE REDUCTION: This exam was performed according to the departmental dose-optimization program which includes automated exposure control, adjustment of the mA and/or kV according to patient size and/or use of iterative reconstruction technique. COMPARISON:  None Available. FINDINGS: CT HEAD FINDINGS Brain: No evidence of acute infarction, hemorrhage, hydrocephalus, extra-axial collection or mass lesion/mass effect. Generalized atrophy with chronic small vessel ischemia in the cerebral white matter. Vascular: No hyperdense vessel or unexpected calcification. Skull: Negative for fracture.  Hyperostosis interna Sinuses/Orbits: No evidence of injury. Bilateral cataract resection and glaucoma reservoir. CT CERVICAL SPINE FINDINGS Alignment: No traumatic malalignment.  Dextrocurvature Skull base and vertebrae: Pronounced osteopenia that is generalized. No acute fracture. Soft tissues and spinal canal: No prevertebral fluid or swelling. No visible canal hematoma. Disc levels: Cervical fusion from C2-C4. Generalized cervical spine degeneration. No visible cord impingement Upper chest: No visible injury IMPRESSION: No evidence of acute intracranial or cervical spine injury. Electronically Signed   By: Jorje Guild M.D.   On: 08/30/2022 07:39   CT CERVICAL SPINE WO CONTRAST  Result Date: 08/30/2022 CLINICAL DATA:  Unwitnessed fall today EXAM: CT HEAD WITHOUT CONTRAST CT CERVICAL SPINE WITHOUT CONTRAST TECHNIQUE: Multidetector CT imaging of the head and cervical spine was performed following the standard protocol without intravenous contrast. Multiplanar CT image reconstructions of the cervical spine were also generated. RADIATION DOSE REDUCTION: This exam was performed according to the departmental dose-optimization program which includes automated exposure control, adjustment of the mA and/or kV according to patient size and/or use of iterative reconstruction technique. COMPARISON:   None Available. FINDINGS: CT HEAD FINDINGS Brain: No evidence of acute infarction, hemorrhage, hydrocephalus, extra-axial collection or mass lesion/mass effect. Generalized atrophy with chronic small vessel ischemia in the cerebral white matter. Vascular: No hyperdense vessel or unexpected calcification. Skull: Negative for fracture.  Hyperostosis interna Sinuses/Orbits: No evidence of injury. Bilateral cataract resection and glaucoma reservoir. CT CERVICAL SPINE FINDINGS Alignment: No traumatic malalignment.  Dextrocurvature Skull base and vertebrae: Pronounced osteopenia that is generalized. No acute fracture. Soft tissues and spinal canal: No prevertebral fluid or swelling. No visible canal hematoma. Disc levels: Cervical fusion from C2-C4. Generalized cervical spine degeneration. No visible cord impingement Upper chest: No visible injury IMPRESSION: No evidence of acute intracranial or cervical spine injury. Electronically Signed   By: Jorje Guild M.D.   On: 08/30/2022 07:39   DG Chest Port 1 View  Result Date: 08/30/2022 CLINICAL DATA:  Chest pain EXAM: PORTABLE CHEST 1 VIEW COMPARISON:  05/12/2022 FINDINGS: Chronic interstitial coarsening. There is no edema, consolidation, effusion, or pneumothorax. Normal heart size. Aortic tortuosity. Artifact from EKG leads. Bilateral glenohumeral arthroplasty. Generalized osteopenia. IMPRESSION: Stable exam.  No evidence of acute disease. Electronically Signed   By: Jorje Guild M.D.   On: 08/30/2022 07:23    DVT Prophylaxis -SCD /Eliquis AM Labs Ordered, also please review Full Orders  Family Communication: Admission, patients condition and plan of care including tests being ordered have been discussed with the patient and son Claiborne Billings who indicate understanding and agree with the plan   Condition  -stable  Roxan Hockey M.D on 08/30/2022 at 6:00 PM Go to [email or web address] -  for contact info  [facility] - Office  [phone]

## 2022-08-30 NOTE — Progress Notes (Signed)
Echocardiogram 2D Echocardiogram has been performed.  Valerie [name] 08/30/2022, 3:48 PM

## 2022-08-30 NOTE — ED Provider Notes (Addendum)
  Physical Exam  BP (!) 125/52   Pulse 65   Temp 98 F (36.7 C)   Resp 16   Ht 5' (1.524 m)   Wt 44 kg   SpO2 96%   BMI 18.94 kg/m   Physical Exam  Procedures  .Critical Care  Performed by: Varney Biles, MD Authorized by: Varney Biles, MD   Critical care provider statement:    Critical care time (minutes):  32   Critical care was necessary to treat or prevent imminent or life-threatening deterioration of the following conditions:  Trauma   Critical care was time spent personally by me on the following activities:  Development of treatment plan with patient or surrogate, discussions with consultants, evaluation of patient's response to treatment, examination of patient, ordering and review of laboratory studies, ordering and review of radiographic studies, ordering and performing treatments and interventions, pulse oximetry, re-evaluation of patient's condition and review of old charts   ED Course / MDM    Medical Decision Making Amount and/or Complexity of Data Reviewed Labs: ordered. Radiology: ordered.  Risk OTC drugs. Prescription drug management. Decision regarding hospitalization.   Cc: chest pain, palpitations. [place] due to syncope after taking nitro. Trauma - CT ordered, she is on eliquis.   Syncope - if trops are normal. Echo - 2020 - no chf.  Per Dr. Sedonia Small - pt wants to go home. She is hemodynamically fine. Symptoms of palpitations, chest pain resolved. If tele is normal, tn is normal - pt can be discharged.   Reassessment: Patient reassessed.  Son is at the bedside.  Patient is complaining of right-sided pain.  On my evaluation, it is noted that she is having right anterolateral pain.  X-ray of the chest independently interpreted.  CT scan of the brain independently interpreted.  There is no evidence of brain bleed and there is no evidence of pneumothorax. Patient has pain with deep inspiration as well.  She is frail-appearing, suspect rib  fractures.  We will get x-ray of the ribs and reassess the patient.  Second troponin is pending.  Initial high-sensitivity troponin is normal.   Reassessment: Patient was given oral Tylenol 1000 mg, Zanaflex and lidocaine patch.  She still has pain with deep inspiration.  X-ray of the ribs indicates ribs 2 through 7 that are broken. Discussed finding with the patient.  She still having difficulty with deep inspiration.  Informed her that it is likely best that she gets admitted for pain control, to avoid complication like pneumonia.  Patient agrees with this recommendation.  Medicine consulted.           Varney Biles, MD 08/30/22 [id number]    Varney Biles, MD 08/30/22 1054

## 2022-08-31 ENCOUNTER — Encounter (HOSPITAL_COMMUNITY): Payer: Self-pay | Admitting: Family Medicine

## 2022-08-31 ENCOUNTER — Observation Stay (HOSPITAL_COMMUNITY): Payer: Medicare PPO

## 2022-08-31 DIAGNOSIS — R55 Syncope and collapse: Secondary | ICD-10-CM | POA: Diagnosis not present

## 2022-08-31 DIAGNOSIS — R0600 Dyspnea, unspecified: Secondary | ICD-10-CM | POA: Diagnosis not present

## 2022-08-31 LAB — CBC
HCT: 37.9 % (ref 36.0–46.0)
Hemoglobin: 12.8 g/dL (ref 12.0–15.0)
MCH: 33.8 pg (ref 26.0–34.0)
MCHC: 33.8 g/dL (ref 30.0–36.0)
MCV: 100 fL (ref 80.0–100.0)
Platelets: 304 10*3/uL (ref 150–400)
RBC: 3.79 MIL/uL — ABNORMAL LOW (ref 3.87–5.11)
RDW: 12.5 % (ref 11.5–15.5)
WBC: 8.3 10*3/uL (ref 4.0–10.5)
nRBC: 0 % (ref 0.0–0.2)

## 2022-08-31 LAB — BASIC METABOLIC PANEL WITH GFR
Anion gap: 8 (ref 5–15)
BUN: 21 mg/dL (ref 8–23)
CO2: 26 mmol/L (ref 22–32)
Calcium: 9.2 mg/dL (ref 8.9–10.3)
Chloride: 103 mmol/L (ref 98–111)
Creatinine, Ser: 0.95 mg/dL (ref 0.44–1.00)
GFR, Estimated: 56 mL/min — ABNORMAL LOW
Glucose, Bld: 104 mg/dL — ABNORMAL HIGH (ref 70–99)
Potassium: 4.2 mmol/L (ref 3.5–5.1)
Sodium: 137 mmol/L (ref 135–145)

## 2022-08-31 MED ORDER — OXYCODONE HCL 5 MG PO TABS: 5.0000 mg | ORAL_TABLET | Freq: Three times a day (TID) | ORAL | 0 refills | Status: DC | PRN

## 2022-08-31 MED ORDER — LOSARTAN POTASSIUM 50 MG PO TABS: 50.0000 mg | ORAL_TABLET | Freq: Every day | ORAL | 2 refills | Status: DC

## 2022-08-31 MED ORDER — ACETAMINOPHEN 325 MG PO TABS: 650.0000 mg | ORAL_TABLET | Freq: Four times a day (QID) | ORAL | 0 refills | Status: DC | PRN

## 2022-08-31 MED ORDER — LIDOCAINE 5 % EX PTCH: 1.0000 | MEDICATED_PATCH | CUTANEOUS | 0 refills | Status: DC

## 2022-08-31 MED ORDER — AMLODIPINE BESYLATE 5 MG PO TABS: 5.0000 mg | ORAL_TABLET | Freq: Every day | ORAL | 11 refills | Status: DC

## 2022-08-31 MED ORDER — NITROGLYCERIN 0.4 MG SL SUBL: 0.4000 mg | SUBLINGUAL_TABLET | SUBLINGUAL | 0 refills | Status: AC | PRN

## 2022-08-31 MED ORDER — METHOCARBAMOL 500 MG PO TABS: 500.0000 mg | ORAL_TABLET | Freq: Two times a day (BID) | ORAL | 1 refills | Status: DC

## 2022-08-31 MED ORDER — POLYETHYLENE GLYCOL 3350 17 G PO PACK: 17.0000 g | PACK | Freq: Every day | ORAL | 3 refills | Status: DC

## 2022-08-31 NOTE — TOC Transition Note (Signed)
Transition of Care [facility]) - CM/SW Discharge Note   Patient Details  Name: Valerie [name] MRN: [id number] Date of Birth: [birth year]  Transition of Care [facility] CM/SW Contact:  Boneta Lucks, RN Phone Number: 08/31/2022, 12:30 PM   Clinical Narrative:   Patient discharging home. MD ordering HHRN/PT. Patient has used [facility] in the past. Referral sent to [place], added to AVS and left daughter a message.   Final next level of care: [place] Barriers to Discharge: Barriers Resolved   Patient Goals and CMS Choice Patient states their goals for this hospitalization and ongoing recovery are:: to go home. CMS Medicare.gov Compare Post Acute Care list provided to:: Patient    Discharge Placement          Patient and family notified of of transfer: 08/31/22  Discharge Plan and Services       HH Arranged: RN, PT [facility] Agency: [place] Date [place]: 08/31/22 Time [place]: 0865 Representative spoke with at [place]: Georgina Snell

## 2022-08-31 NOTE — Discharge Summary (Signed)
Valerie [name], is a 88 y.o. female  DOB 28-May-1928  MRN [id number].  Admission date:  08/30/2022  Admitting Physician  Roxan Hockey, MD  Discharge Date:  08/31/2022   Primary MD  Chevis Pretty, FNP  Recommendations for primary care physician for things to follow:   1)Oxycodone and other pain medications can cause confusion, disorientation and constipation----so Please use oxycodone for pain control very sparingly  2)Please Avoid constipation  3) please note that your blood pressure medication and other medications have been adjusted  4)A physical therapist to come to the house to help you get stronger and improve your gait instability  Admission Diagnosis  Syncope and collapse [R55] Palpitations [R00.2] Fall, initial encounter [W19.XXXA] Closed fracture of multiple ribs of right side, initial encounter [S22.41XA] Syncope, unspecified syncope type [R55] Chest pain, unspecified type [R07.9]   Discharge Diagnosis  Syncope and collapse [R55] Palpitations [R00.2] Fall, initial encounter [W19.XXXA] Closed fracture of multiple ribs of right side, initial encounter [S22.41XA] Syncope, unspecified syncope type [R55] Chest pain, unspecified type [R07.9]    Principal Problem:   Syncope and collapse Active Problems:   Moderate to severe aortic insufficiency   Closed Rt rib fracture -3rd thru 7th   Paroxysmal Atrial fibrillation (HCC)   Hyperthyroidism   Essential hypertension      Past Medical History:  Diagnosis Date   Anxiety    Arthritis    Broken ribs    Carotid artery disease (HCC)    Cataract    Collar bone fracture    Right   Depression    Diverticulosis of colon with hemorrhage    Duodenitis    Esophageal reflux    Esophageal stricture    Essential hypertension    Glaucoma    Hiatal hernia    History of kidney stones     x1    Hyperlipidemia    IBS (irritable bowel  syndrome)    Palpitations    UTI (urinary tract infection) March 2015    Past Surgical History:  Procedure Laterality Date   CHOLECYSTECTOMY     COLONOSCOPY     DILATION AND CURETTAGE OF UTERUS     EYE SURGERY Bilateral    total of 5   HIP ARTHROPLASTY Right 03/16/2021   Procedure: POSTERIOR ARTHROPLASTY BIPOLAR HIP (HEMIARTHROPLASTY);  Surgeon: [name], MD;  Location: [facility];  Service: Orthopedics;  Laterality: Right;   LASER PHOTO ABLATION Right 10/20/2016   Procedure: LASER PHOTO ABLATION;  Surgeon: Hayden Pedro, MD;  Location: [facility];  Service: Ophthalmology;  Laterality: Right;  Headscope laser   LEFT ROTATOR CUFF REPAIR X2 Left    PARS PLANA VITRECTOMY  10/20/2016   WITH 25G REMOVAL/SUTURE SECONDARY INTRAOCULAR LENS, GAS FLUID EXCHANGE    PARS PLANA VITRECTOMY Right 10/20/2016   Procedure: PARS PLANA VITRECTOMY WITH 25G REMOVAL/SUTURE SECONDARY INTRAOCULAR LENS, GAS FLUID EXCHANGE;  Surgeon: Hayden Pedro, MD;  Location: [facility];  Service: Ophthalmology;  Laterality: Right;   RIGHT ROTATOR CUFF REPAIR X1 Right  UPPER GASTROINTESTINAL ENDOSCOPY     with dilation       HPI  from the history and physical done on the day of admission:   Valerie [name]  is a 88 y.o. female with PMHx relevant for HTN, HLD, anxiety, depression, GERD, CAD, glaucoma-underwent intervention , chronic back pain/arthritis, opiate induced constipation concerns, atrial fibrillation, moderate to severe aortic insufficiency, carotid artery stenosis/PAD she  lives alone but has family living next door and they check on her frequently presents to the ED by EMS after unwitnessed fall while on Eliquis--- complains of chest pain and is found to have rib fractures -Patient endorses palpitations and dizziness since he took nitroglycerin x 2 tablets and probably experienced hypotension and then fell fracturing her ribs -Patient endorses loss of consciousness and poor recall of events after she passed out -Additional  history obtained from patient's son Claiborne Billings at bedside -Dedicated rib x-rays shows Acute minimally displaced fractures of the lateral right third through seventh ribs. -CT head and CT C-spine without acute findings -Plain film chest x-ray without acute cardiopulmonary findings -TSH 3.0 -Creatinine 0.8, electrolytes WNL -Glucose 105 Troponin 7 >>10 -EKG sinus rhythm without acute findings     Hospital Course:     1) syncope/status post fall--- suspect this is due to nitroglycerin induced hypotension -CT head and CT C-spine without acute findings -Patient endorses palpitations and dizziness since he took nitroglycerin x 2 tablets and probably experienced hypotension and then fell fracturing her ribs -Patient endorses loss of consciousness and poor recall of events after she passed out -Troponin 7 >>10 -EKG sinus rhythm without acute findings --Glucose 105 -Echo today with EF of 65 to 70% with grade 1 diastolic dysfunction with moderate aortic insufficiency   2)Rt sided Rib Fx--- Dedicated rib x-rays shows Acute minimally displaced fractures of the lateral right third through seventh ribs. -Also relaxants pain medications as ordered -Incentive spirometry -Physical therapy eval appreciated   3)PAFib--continue Eliquis for stroke prophylaxis, Coreg for rate control   4)H/o moderate aortic insufficiency--- Per cardiology notes manage medically   5)Carotid artery stenosis/PAD--continue Eliquis and pravastatin   6)Hypothyroidism--- continue methimazole and Coreg -TSH 3.0   7)HTN-restart Coreg 6.5 mg twice daily -IV hydralazine as needed elevated BP   8) social/ethics ---she  lives alone but has family living next door and they check on her frequently --Additional history obtained from patient's son Claiborne Billings and son Tim at bedside   Dispo: The patient is from: Home              Anticipated d/c is to: Home with [facility]   Discharge Condition: ***  Follow UP     Consults obtained -  ***  Diet and Activity recommendation:  As advised  Discharge Instructions    **** Discharge Instructions     Call MD for:  difficulty breathing, headache or visual disturbances   Complete by: As directed    Call MD for:  persistant dizziness or light-headedness   Complete by: As directed    Call MD for:  persistant nausea and vomiting   Complete by: As directed    Call MD for:  severe uncontrolled pain   Complete by: As directed    Call MD for:  temperature >100.4   Complete by: As directed    Diet - low sodium heart healthy   Complete by: As directed    Discharge instructions   Complete by: As directed    1)Oxycodone and other pain medications can cause confusion, disorientation  and constipation----so Please use oxycodone for pain control very sparingly  2)Please Avoid constipation  3) please note that your blood pressure medication and other medications have been adjusted  4)A physical therapist to come to the house to help you get stronger and improve your gait instability   Increase activity slowly   Complete by: As directed          Discharge Medications     Allergies as of 08/31/2022       Reactions   Aricept [donepezil Hcl] Other (See Comments)   Nightmares, near syncope, weak, decreased appetite.   Lisinopril Other (See Comments)   Hair loss   Sulfa Antibiotics Nausea Only   Sulfasalazine Nausea Only        Medication List     STOP taking these medications    ondansetron 4 MG tablet Commonly known as: ZOFRAN   propranolol 10 MG tablet Commonly known as: INDERAL       TAKE these medications    acetaminophen 325 MG tablet Commonly known as: TYLENOL Take 2 tablets (650 mg total) by mouth every 6 (six) hours as needed for headache or mild pain (Rib cage pain). What changed: reasons to take this   amLODipine 5 MG tablet Commonly known as: NORVASC Take 1 tablet (5 mg total) by mouth daily. For BP What changed:  medication strength how  much to take additional instructions   apixaban 2.5 MG Tabs tablet Commonly known as: Eliquis TAKE  (1)  TABLET TWICE A DAY.   carvedilol 3.125 MG tablet Commonly known as: COREG TAKE  (1)  TABLET TWICE A DAY WITH MEALS (BREAKFAST AND SUPPER)   fluticasone 50 MCG/ACT nasal spray Commonly known as: FLONASE Place 2 sprays into both nostrils as needed for allergies or rhinitis.   lidocaine 5 % Commonly known as: LIDODERM Place 1 patch onto the skin daily. Apply to Rib  pain area daily ----Remove & Discard patch within 12 hours or as directed by MD Start taking on: September 01, 2022   losartan 50 MG tablet Commonly known as: Cozaar Take 1 tablet (50 mg total) by mouth daily. FOR BP What changed:  medication strength how much to take additional instructions   methimazole 5 MG tablet Commonly known as: TAPAZOLE Take 1 tablet (5 mg total) by mouth daily.   methocarbamol 500 MG tablet Commonly known as: ROBAXIN Take 1 tablet (500 mg total) by mouth 2 (two) times daily. Muscle relaxant   nitroGLYCERIN 0.4 MG SL tablet Commonly known as: Nitrostat Place 1 tablet (0.4 mg total) under the tongue every 5 (five) minutes as needed for chest pain. MAX 2 TAB What changed:  how much to take how to take this when to take this reasons to take this additional instructions   oxyCODONE 5 MG immediate release tablet Commonly known as: Oxy IR/ROXICODONE Take 1 tablet (5 mg total) by mouth every 8 (eight) hours as needed for moderate pain.   pantoprazole 40 MG tablet Commonly known as: PROTONIX Take 1 tablet (40 mg total) by mouth daily.   polyethylene glycol 17 g packet Commonly known as: MIRALAX / GLYCOLAX Take 17 g by mouth daily. Start taking on: September 01, 2022   polyvinyl alcohol 1.4 % ophthalmic solution Commonly known as: LIQUIFILM TEARS Place 1 drop into both eyes as needed for dry eyes.   pravastatin 10 MG tablet Commonly known as: PRAVACHOL Take 1 tablet (10 mg  total) by mouth daily.   Vitamin D3 1.25 MG (50000  UT) Tabs Take 1 capsule by mouth every 7 (seven) days. Monday        Major procedures and Radiology Reports - PLEASE review detailed and final reports for all details, in brief -   ***  DG Chest 2 View  Result Date: 08/31/2022 CLINICAL DATA:  Dyspnea and respiratory abnormalities. EXAM: CHEST - 2 VIEW COMPARISON:  08/30/2022 FINDINGS: Lungs are hyperexpanded. No edema, focal consolidation, or substantial pleural effusion. There may be some trace pleural fluid at the right base. Multiple right-sided rib fractures again noted. Status post bilateral shoulder replacement. IMPRESSION: Multiple right-sided rib fractures with trace pleural fluid at the right base. No evidence for pneumothorax or pulmonary edema. Electronically Signed   By: Misty Stanley M.D.   On: 08/31/2022 06:32   ECHOCARDIOGRAM COMPLETE  Result Date: 08/30/2022    ECHOCARDIOGRAM REPORT   Patient Name:   Valerie [name] Date of Exam: 08/30/2022 Medical Rec #:  [id number]    Height:       60.0 in Accession #:    [id number]   Weight:       97.0 lb Date of Birth:  Feb 17, 1928     BSA:          1.372 m Patient Age:    72 years     BP:           112/50 mmHg Patient Gender: F            HR:           64 bpm. Exam Location:  [facility] Procedure: 2D Echo, Cardiac Doppler and Color Doppler Indications:    R55 Syncope  History:        Patient has prior history of Echocardiogram examinations, most                 recent 01/14/2019. Signs/Symptoms:Syncope; Risk                 Factors:Dyslipidemia and Hypertension.  Sonographer:    Bernadene Person RDCS Referring Phys: [place]  1. Left ventricular ejection fraction, by estimation, is 65 to 70%. The left ventricle has normal function. The left ventricle has no regional wall motion abnormalities. Left ventricular diastolic parameters are consistent with Grade I diastolic dysfunction (impaired relaxation). Elevated left atrial  pressure.  2. Right ventricular systolic function is normal. The right ventricular size is normal. There is normal pulmonary artery systolic pressure.  3. Mild mitral valve regurgitation. Moderate mitral annular calcification.  4. The aortic valve is tricuspid. Aortic valve regurgitation is mild to moderate. Comparison(s): The left ventricular function is unchanged. FINDINGS  Left Ventricle: Left ventricular ejection fraction, by estimation, is 65 to 70%. The left ventricle has normal function. The left ventricle has no regional wall motion abnormalities. The left ventricular internal cavity size was small. There is no left ventricular hypertrophy. Left ventricular diastolic parameters are consistent with Grade I diastolic dysfunction (impaired relaxation). Elevated left atrial pressure. Right Ventricle: The right ventricular size is normal. Right vetricular wall thickness was not assessed. Right ventricular systolic function is normal. There is normal pulmonary artery systolic pressure. The tricuspid regurgitant velocity is 2.13 m/s, and with an assumed right atrial pressure of 3 mmHg, the estimated right ventricular systolic pressure is 71.0 mmHg. Left Atrium: Left atrial size was normal in size. Right Atrium: Right atrial size was normal in size. Pericardium: Trivial pericardial effusion is present. Mitral Valve: There is mild thickening of the mitral valve leaflet(s). There  is mild calcification of the mitral valve leaflet(s). Moderate mitral annular calcification. Mild mitral valve regurgitation. Tricuspid Valve: The tricuspid valve is normal in structure. Tricuspid valve regurgitation is mild. Aortic Valve: The aortic valve is tricuspid. Aortic valve regurgitation is mild to moderate. Aortic regurgitation PHT measures 370 msec. Pulmonic Valve: The pulmonic valve was not well visualized. Pulmonic valve regurgitation is not visualized. Aorta: The aortic root and ascending aorta are structurally normal, with no  evidence of dilitation. IAS/Shunts: No atrial level shunt detected by color flow Doppler.  LEFT VENTRICLE PLAX 2D LVIDd:         3.00 cm     Diastology LVIDs:         2.00 cm     LV e' medial:    3.71 cm/s LV PW:         0.80 cm     LV E/e' medial:  18.7 LV IVS:        0.90 cm     LV e' lateral:   4.74 cm/s LVOT diam:     1.90 cm     LV E/e' lateral: 14.6 LV SV:         83 LV SV Index:   61 LVOT Area:     2.84 cm  LV Volumes (MOD) LV vol d, MOD A2C: 56.0 ml LV vol d, MOD A4C: 61.3 ml LV vol s, MOD A2C: 22.4 ml LV vol s, MOD A4C: 19.9 ml LV SV MOD A2C:     33.6 ml LV SV MOD A4C:     61.3 ml LV SV MOD BP:      38.1 ml RIGHT VENTRICLE RV S prime:     14.10 cm/s TAPSE (M-mode): 2.1 cm LEFT ATRIUM             Index        RIGHT ATRIUM           Index LA diam:        3.20 cm 2.33 cm/m   RA Area:     12.30 cm LA Vol (A2C):   37.3 ml 27.18 ml/m  RA Volume:   25.70 ml  18.73 ml/m LA Vol (A4C):   43.7 ml 31.84 ml/m LA Biplane Vol: 42.2 ml 30.75 ml/m  AORTIC VALVE LVOT Vmax:   110.00 cm/s LVOT Vmean:  74.900 cm/s LVOT VTI:    0.294 m AI PHT:      370 msec  AORTA Ao Root diam: 2.80 cm Ao Asc diam:  2.70 cm MITRAL VALVE                TRICUSPID VALVE MV Area (PHT): 2.24 cm     TR Peak grad:   18.1 mmHg MV Decel Time: 338 msec     TR Vmax:        213.00 cm/s MV E velocity: 69.20 cm/s MV A velocity: 149.00 cm/s  SHUNTS MV E/A ratio:  0.46         Systemic VTI:  0.29 m                             Systemic Diam: 1.90 cm Dorris Carnes MD Electronically signed by Dorris Carnes MD Signature Date/Time: 08/30/2022/4:06:27 PM    Final    DG Ribs Unilateral Right  Result Date: 08/30/2022 CLINICAL DATA:  88 year old female with history of trauma from a fall. Chest pain. EXAM: RIGHT RIBS - 2 VIEW COMPARISON:  Chest x-ray 08/30/2022. FINDINGS: Two views of the right ribs demonstrate acute minimally displaced fractures of the lateral right third, fourth, fifth, sixth and seventh ribs. Status post right shoulder arthroplasty. Surgical  clips project over the right upper quadrant of the abdomen, likely from prior cholecystectomy. Extensive aortic atherosclerosis. IMPRESSION: 1. Acute minimally displaced fractures of the lateral right third through seventh ribs. Electronically Signed   By: Vinnie Langton M.D.   On: 08/30/2022 09:42   CT HEAD WO CONTRAST (5MM)  Result Date: 08/30/2022 CLINICAL DATA:  Unwitnessed fall today EXAM: CT HEAD WITHOUT CONTRAST CT CERVICAL SPINE WITHOUT CONTRAST TECHNIQUE: Multidetector CT imaging of the head and cervical spine was performed following the standard protocol without intravenous contrast. Multiplanar CT image reconstructions of the cervical spine were also generated. RADIATION DOSE REDUCTION: This exam was performed according to the departmental dose-optimization program which includes automated exposure control, adjustment of the mA and/or kV according to patient size and/or use of iterative reconstruction technique. COMPARISON:  None Available. FINDINGS: CT HEAD FINDINGS Brain: No evidence of acute infarction, hemorrhage, hydrocephalus, extra-axial collection or mass lesion/mass effect. Generalized atrophy with chronic small vessel ischemia in the cerebral white matter. Vascular: No hyperdense vessel or unexpected calcification. Skull: Negative for fracture.  Hyperostosis interna Sinuses/Orbits: No evidence of injury. Bilateral cataract resection and glaucoma reservoir. CT CERVICAL SPINE FINDINGS Alignment: No traumatic malalignment.  Dextrocurvature Skull base and vertebrae: Pronounced osteopenia that is generalized. No acute fracture. Soft tissues and spinal canal: No prevertebral fluid or swelling. No visible canal hematoma. Disc levels: Cervical fusion from C2-C4. Generalized cervical spine degeneration. No visible cord impingement Upper chest: No visible injury IMPRESSION: No evidence of acute intracranial or cervical spine injury. Electronically Signed   By: Jorje Guild M.D.   On: 08/30/2022  07:39   CT CERVICAL SPINE WO CONTRAST  Result Date: 08/30/2022 CLINICAL DATA:  Unwitnessed fall today EXAM: CT HEAD WITHOUT CONTRAST CT CERVICAL SPINE WITHOUT CONTRAST TECHNIQUE: Multidetector CT imaging of the head and cervical spine was performed following the standard protocol without intravenous contrast. Multiplanar CT image reconstructions of the cervical spine were also generated. RADIATION DOSE REDUCTION: This exam was performed according to the departmental dose-optimization program which includes automated exposure control, adjustment of the mA and/or kV according to patient size and/or use of iterative reconstruction technique. COMPARISON:  None Available. FINDINGS: CT HEAD FINDINGS Brain: No evidence of acute infarction, hemorrhage, hydrocephalus, extra-axial collection or mass lesion/mass effect. Generalized atrophy with chronic small vessel ischemia in the cerebral white matter. Vascular: No hyperdense vessel or unexpected calcification. Skull: Negative for fracture.  Hyperostosis interna Sinuses/Orbits: No evidence of injury. Bilateral cataract resection and glaucoma reservoir. CT CERVICAL SPINE FINDINGS Alignment: No traumatic malalignment.  Dextrocurvature Skull base and vertebrae: Pronounced osteopenia that is generalized. No acute fracture. Soft tissues and spinal canal: No prevertebral fluid or swelling. No visible canal hematoma. Disc levels: Cervical fusion from C2-C4. Generalized cervical spine degeneration. No visible cord impingement Upper chest: No visible injury IMPRESSION: No evidence of acute intracranial or cervical spine injury. Electronically Signed   By: Jorje Guild M.D.   On: 08/30/2022 07:39   DG Chest Port 1 View  Result Date: 08/30/2022 CLINICAL DATA:  Chest pain EXAM: PORTABLE CHEST 1 VIEW COMPARISON:  05/12/2022 FINDINGS: Chronic interstitial coarsening. There is no edema, consolidation, effusion, or pneumothorax. Normal heart size. Aortic tortuosity. Artifact from  EKG leads. Bilateral glenohumeral arthroplasty. Generalized osteopenia. IMPRESSION: Stable exam.  No evidence of acute disease. Electronically Signed  By: Jorje Guild M.D.   On: 08/30/2022 07:23    Micro Results   *** No results found for this or any previous visit (from the past 240 hour(s)).  Today   Subjective    Valerie [name] today has no ***          Patient has been seen and examined prior to discharge   Objective   Blood pressure (!) 139/59, pulse 66, temperature 97.8 F (36.6 C), temperature source Oral, resp. rate 15, height 5' (1.524 m), weight 44 kg, SpO2 93 %.   Intake/Output Summary (Last 24 hours) at 08/31/2022 1238 Last data filed at 08/30/2022 2213 Gross per 24 hour  Intake 6 ml  Output --  Net 6 ml    Exam Gen:- Awake Alert, no acute distress *** HEENT:- [place].AT, No sclera icterus Neck-Supple Neck,No JVD,.  Lungs-  CTAB , good air movement bilaterally CV- S1, S2 normal, regular Abd-  +ve B.Sounds, Abd Soft, No tenderness,    Extremity/Skin:- No  edema,   good pulses Psych-affect is appropriate, oriented x3 Neuro-no new focal deficits, no tremors ***   Data Review   CBC w Diff:  Lab Results  Component Value Date   WBC 8.3 08/31/2022   HGB 12.8 08/31/2022   HGB 13.0 07/27/2022   HCT 37.9 08/31/2022   HCT 38.3 07/27/2022   PLT 304 08/31/2022   PLT 344 07/27/2022   LYMPHOPCT 7 03/17/2021   MONOPCT 4 03/17/2021   EOSPCT 0 03/17/2021   BASOPCT 0 03/17/2021    CMP:  Lab Results  Component Value Date   NA 137 08/31/2022   NA 142 07/27/2022   K 4.2 08/31/2022   CL 103 08/31/2022   CO2 26 08/31/2022   BUN 21 08/31/2022   BUN 19 07/27/2022   CREATININE 0.95 08/31/2022   CREATININE 0.80 07/10/2016   PROT 6.8 07/27/2022   ALBUMIN 4.5 07/27/2022   BILITOT 0.5 07/27/2022   ALKPHOS 85 07/27/2022   AST 15 07/27/2022   ALT 9 07/27/2022  .  Total Discharge time is about 33 minutes  Roxan Hockey M.D on 08/31/2022 at 12:38 PM  Go to  [email or web address] -  for contact info  [facility] - Office  [phone]

## 2022-08-31 NOTE — Plan of Care (Signed)
  Problem: Acute Rehab PT Goals(only PT should resolve) Goal: Pt Will Go Supine/Side To Sit 08/31/2022 1523 by Alveda Reasons D, PT Outcome: Progressing 08/31/2022 1522 by Alveda Reasons D, PT Flowsheets (Taken 08/31/2022 1522) Pt will go Supine/Side to Sit: with supervision   Problem: Acute Rehab PT Goals(only PT should resolve) Goal: Patient Will Transfer Sit To/From Stand 08/31/2022 1523 by Alveda Reasons D, PT Outcome: Progressing 08/31/2022 1522 by Alveda Reasons D, PT Flowsheets (Taken 08/31/2022 1522) Patient will transfer sit to/from stand: with supervision   Problem: Acute Rehab PT Goals(only PT should resolve) Goal: Pt Will Ambulate 08/31/2022 1522 by Alveda Reasons D, PT Flowsheets (Taken 08/31/2022 1522) Pt will Ambulate:  25 feet  with supervision  with rolling walker   Wonda Olds PT, DPT Physical Therapist with [facility] [phone] office

## 2022-08-31 NOTE — Progress Notes (Signed)
Patient back in room. Patient positioned comfortably in bed. Bed in low position and bed alarm on. Telemetry called and patient placed off standby. Will continue to monitor.

## 2022-08-31 NOTE — Progress Notes (Signed)
Patient off the floor at this time. Patient taken down for DG chest.

## 2022-08-31 NOTE — Evaluation (Addendum)
Physical Therapy Evaluation Patient Details Name: Valerie [name] MRN: [id number] DOB: [birth year] Today's Date: 08/31/2022  History of Present Illness  Valerie [name]  is a 88 y.o. female with PMHx relevant for HTN, HLD, anxiety, depression, GERD, CAD, glaucoma-underwent intervention , chronic back pain/arthritis, opiate induced constipation concerns, atrial fibrillation, moderate to severe aortic insufficiency, carotid artery stenosis/PAD she  lives alone but has family living next door and they check on her frequently presents to the ED by EMS after unwitnessed fall while on Eliquis--- complains of chest pain and is found to have rib fractures  Clinical Impression  Pt is a 88yo female presenting to [facility] due to reason stated in the HPI.   Pt tolerated today's Physical Therapy Evaluation and slightly agitated and confused throughout session but was able to complete session. Pt performed Mod assist x 1 w/ bed mobility, Min assist x 1 w/RW w/ functional transfers, and 69f @ min assist w/ RW for gait/ambulation.   Pt is demonstrating mild balance and conditioning impairments, but is near functional baseline. Pt's son reaffirmed that pt is close to baseline and is able to be cared for at discharge home at current level. Pt was educated on use of RW and was agreeable to HUblyfor mild balance and activity tolerance impairments. While admitted pt will continue to benefit from skilled PT services to address functional impairments and prepare for safe DC home.             Recommendations for follow up therapy are one component of a multi-disciplinary discharge planning process, led by the attending physician.  Recommendations may be updated based on patient status, additional functional criteria and insurance authorization.  Follow Up Recommendations Home health PT      Assistance Recommended at Discharge Set up Supervision/Assistance  Patient can return home  with the following  A little help with walking and/or transfers    Equipment Recommendations    Recommendations for Other Services       Functional Status Assessment Patient has had a recent decline in their functional status and demonstrates the ability to make significant improvements in function in a reasonable and predictable amount of time.     Precautions / Restrictions Precautions Precautions: None Restrictions Weight Bearing Restrictions: No      Mobility  Bed Mobility Overal bed mobility: Needs Assistance Bed Mobility: Sidelying to Sit   Sidelying to sit: Min assist       General bed mobility comments: Pt given tactile cues for bedrail placement as pt reaching for therapist lifting assistant.    Transfers Overall transfer level: Needs assistance Equipment used: Rolling walker (2 wheels) Transfers: Sit to/from Stand Sit to Stand: Min assist           General transfer comment: Min assist to powerup from EOB, cues given for anterior weight shift.    Ambulation/Gait Ambulation/Gait assistance: Min assist Gait Distance (Feet): 5 Feet Assistive device: Rolling walker (2 wheels) Gait Pattern/deviations: Decreased step length - right, Decreased step length - left, Shuffle, Trunk flexed Gait velocity: decreased     General Gait Details: kyphotic posture with RW for 568ffrom EOB to recliner. min assist for RW management and to reduce posterior lean in standing.        Balance Overall balance assessment: Needs assistance Sitting-balance support: Feet supported, Single extremity supported Sitting balance-Leahy Scale: Fair Sitting balance - Comments: increased kyphotic posture. Postural control: Posterior lean Standing balance support: Bilateral upper  extremity supported Standing balance-Leahy Scale: Fair Standing balance comment: Pt requiring intermittment tactile assist to maintain standing with BUE on RW.                              Pertinent Vitals/Pain Pain Assessment Pain Assessment: No/denies pain    Home Living Family/patient expects to be discharged to:: Private residence Living Arrangements: Children Available Help at Discharge: Family;Available 24 hours/day Type of Home: House Home Access: Stairs to enter Entrance Stairs-Rails: None Entrance Stairs-Number of Steps: 3   Home Layout: One level Home Equipment: Conservation officer, nature (2 wheels);Cane - single point;Wheelchair - manual;Grab bars - tub/shower Additional Comments: Pt's son reports nephew lives with pt and would be available 24/7.    Prior Function Prior Level of Function : Independent/Modified Independent             Mobility Comments: Pt's son reports using SPC for househould ambulation but "should use her rolling walker."       Hand Dominance        Extremity/Trunk Assessment   Upper Extremity Assessment Upper Extremity Assessment: Generalized weakness    Lower Extremity Assessment Lower Extremity Assessment: Generalized weakness    Cervical / Trunk Assessment Cervical / Trunk Assessment: Kyphotic  Communication      Cognition Arousal/Alertness: Awake/alert Behavior During Therapy: WFL for tasks assessed/performed Overall Cognitive Status: Impaired/Different from baseline Area of Impairment: Orientation                 Orientation Level: Person, Place             General Comments: Pt with occasional agitation when requested for functional mobility with simple cuing and direction.               Assessment/Plan Discharge Home with Home Health PT services   PT Assessment All further PT needs can be met in the next venue of care  PT Problem List Decreased activity tolerance;Decreased balance       PT Treatment Interventions   Therapeutic Activities, therapeutic exercises, gait training, neuromuscular re-education   PT Goals (Current goals can be found in the Care Plan section)  Acute Rehab PT  Goals Patient Stated Goal: "walk without a rolling walker." PT Goal Formulation: With patient Time For Goal Achievement: 09/14/22 Potential to Achieve Goals: Good    Frequency  2x week     AM-PAC PT "6 Clicks" Mobility  Outcome Measure Help needed turning from your back to your side while in a flat bed without using bedrails?: A Lot Help needed moving from lying on your back to sitting on the side of a flat bed without using bedrails?: None Help needed moving to and from a bed to a chair (including a wheelchair)?: A Little Help needed standing up from a chair using your arms (e.g., wheelchair or bedside chair)?: A Little Help needed to walk in hospital room?: A Little Help needed climbing 3-5 steps with a railing? : Total 6 Click Score: 16    End of Session Equipment Utilized During Treatment: Gait belt Activity Tolerance: Patient tolerated treatment well Patient left: in chair;with chair alarm set;with family/visitor present;with call bell/phone within reach Nurse Communication: Mobility status PT Visit Diagnosis: Unsteadiness on feet (R26.81);Muscle weakness (generalized) (M62.81);History of falling (Z91.81)    Time: 1740-8144 PT Time Calculation (min) (ACUTE ONLY): 13 min   Charges:   PT Evaluation $PT Eval Low Complexity: 1 Low PT Treatments $Therapeutic Activity: 8-22  mins        Wonda Olds PT, DPT Physical Therapist with [facility] [phone] office  Wonda Olds 08/31/2022, 3:16 PM

## 2022-09-01 ENCOUNTER — Telehealth: Payer: Self-pay | Admitting: Nurse Practitioner

## 2022-09-03 ENCOUNTER — Telehealth: Payer: Self-pay | Admitting: Nurse Practitioner

## 2022-09-03 NOTE — Telephone Encounter (Signed)
Calling to request new order for skilled nursing evaluation due to wound on left lower leg and skin tear on right leg

## 2022-09-04 NOTE — Telephone Encounter (Signed)
Spoke with patient's daughter, Dondra Spry.  She said patient is not able to come in right now because of pain but should be better in a couple of weeks.  We scheduled a hospital follow up on 09/22/22 with Shelah Lewandowsky.

## 2022-09-04 NOTE — Telephone Encounter (Signed)
TC w/ Allie PT from [facility] gave VO to resume PT and to add nursing for wound & skin tears. She encouraged son to call office for HFU appt since a couple of medications have been changed. Will forward message to our triage nurse so that she can reach out to pt/son to get this scheduled as well.

## 2022-09-16 ENCOUNTER — Other Ambulatory Visit: Payer: Self-pay | Admitting: Nurse Practitioner

## 2022-09-16 DIAGNOSIS — R0602 Shortness of breath: Secondary | ICD-10-CM

## 2022-09-22 ENCOUNTER — Inpatient Hospital Stay: Payer: Medicare PPO | Admitting: Nurse Practitioner

## 2022-09-24 ENCOUNTER — Ambulatory Visit (INDEPENDENT_AMBULATORY_CARE_PROVIDER_SITE_OTHER): Payer: Medicare PPO

## 2022-09-24 DIAGNOSIS — F32A Depression, unspecified: Secondary | ICD-10-CM | POA: Diagnosis not present

## 2022-09-24 DIAGNOSIS — S2241XD Multiple fractures of ribs, right side, subsequent encounter for fracture with routine healing: Secondary | ICD-10-CM | POA: Diagnosis not present

## 2022-09-24 DIAGNOSIS — M8588 Other specified disorders of bone density and structure, other site: Secondary | ICD-10-CM

## 2022-09-24 DIAGNOSIS — I2510 Atherosclerotic heart disease of native coronary artery without angina pectoris: Secondary | ICD-10-CM

## 2022-09-24 DIAGNOSIS — M47818 Spondylosis without myelopathy or radiculopathy, sacral and sacrococcygeal region: Secondary | ICD-10-CM | POA: Diagnosis not present

## 2022-09-24 DIAGNOSIS — M2578 Osteophyte, vertebrae: Secondary | ICD-10-CM

## 2022-09-24 DIAGNOSIS — I4891 Unspecified atrial fibrillation: Secondary | ICD-10-CM

## 2022-09-24 DIAGNOSIS — I10 Essential (primary) hypertension: Secondary | ICD-10-CM | POA: Diagnosis not present

## 2022-09-24 DIAGNOSIS — S22080D Wedge compression fracture of T11-T12 vertebra, subsequent encounter for fracture with routine healing: Secondary | ICD-10-CM

## 2022-09-24 DIAGNOSIS — I351 Nonrheumatic aortic (valve) insufficiency: Secondary | ICD-10-CM

## 2022-09-24 DIAGNOSIS — K219 Gastro-esophageal reflux disease without esophagitis: Secondary | ICD-10-CM

## 2022-09-24 DIAGNOSIS — M1990 Unspecified osteoarthritis, unspecified site: Secondary | ICD-10-CM

## 2022-09-24 DIAGNOSIS — F419 Anxiety disorder, unspecified: Secondary | ICD-10-CM | POA: Diagnosis not present

## 2022-09-24 DIAGNOSIS — K5730 Diverticulosis of large intestine without perforation or abscess without bleeding: Secondary | ICD-10-CM

## 2022-09-24 DIAGNOSIS — M545 Low back pain, unspecified: Secondary | ICD-10-CM

## 2022-09-24 DIAGNOSIS — G8929 Other chronic pain: Secondary | ICD-10-CM

## 2022-09-29 ENCOUNTER — Telehealth: Payer: Self-pay | Admitting: Nurse Practitioner

## 2022-09-29 NOTE — Telephone Encounter (Signed)
Left message for patient to call back and schedule Medicare Annual Wellness Visit (AWV) to be completed by video or phone.   Last AWV: 07/25/2021   Please schedule at anytime with [place]     45 minute appointment  Any questions, please contact me at [phone]   Thank you,   [facility] Ambulatory Clinical Support for [place] [facility]. [facility] Are[facility] ??[phone] or ??[phone]

## 2022-11-08 DIAGNOSIS — R0789 Other chest pain: Secondary | ICD-10-CM | POA: Diagnosis not present

## 2022-11-08 DIAGNOSIS — R079 Chest pain, unspecified: Secondary | ICD-10-CM | POA: Diagnosis not present

## 2022-11-08 DIAGNOSIS — Z882 Allergy status to sulfonamides status: Secondary | ICD-10-CM | POA: Diagnosis not present

## 2022-11-13 ENCOUNTER — Telehealth: Payer: Self-pay | Admitting: Nurse Practitioner

## 2022-11-13 NOTE — Telephone Encounter (Signed)
Called patient to schedule Medicare Annual Wellness Visit (AWV). No voicemail available to leave a message.  Last date of AWV: 07/25/2021   Please schedule an appointment at any time with [facility].  If any questions, please contact me at [phone].  Thank you ,  Thank you,  [facility] ??[phone]

## 2022-11-20 ENCOUNTER — Encounter: Payer: Self-pay | Admitting: Cardiovascular Disease

## 2022-11-30 ENCOUNTER — Encounter: Payer: Self-pay | Admitting: Cardiovascular Disease

## 2022-11-30 NOTE — Progress Notes (Unsigned)
Cardiology Office Note   Date:  12/01/2022   ID:  Valerie [name], DOB 1927/10/05, MRN [id number]  PCP:  Chevis Pretty, FNP  Cardiologist:  Dr. Acie Fredrickson    Chief Complaint  Patient presents with   Hypertension        Atrial Fibrillation   Hyperlipidemia     Please note pt was followed by Dr. Mare Ferrari and with his retirement was seen once by Dr. Domenic Polite in [place], but her daughter who is followed by Dr. Acie Fredrickson would like her seen by Dr. Acie Fredrickson.  He has agreed to see pt.  Previous notes from [place]:  JOYLYNN NEWBERG is a 89 y.o. female who presents for HTN, bradycardia and dizziness.  PMH of HTN, HLD, anxiety, depression, GERD, CAD, glaucoma-underwent intervention approximately 3 months ago, lives alone but has family living next door who provide close supervision, presented to [facility] ED on 06/30/16 with complaints of ongoing blurred vision for several weeks post procedure,? Intermittent dyspnea and arm pain. Along with chest pain that she states is arthritic.  She checks her blood pressures regularly at home and apparently were elevated in the 220/95 range despite compliance with medications. She apparently has not felt well for a couple of days, headache, poor oral intake and visual changes since surgery for which she has recently been seen by her ophthalmologist and has follow-up in 2 months. In the ED, CT head without acute abnormalities, initial blood pressure 220/77. Admitted for hypertensive urgency.  Her troponin was negative.  Hx of neg nuc study in 2015.     She had HR to 46 (though looking back she has been slow before)..  Meds adjusted and plan for outpt echo.  She had complained about SOB but was not clear today she does have at times.   Was with hypokalemia and follow up was with K+ 3.3.     Hx of carotid disease with last check in 09/2015 with 1-39% bil stenosis. To be seen by vascular in Dec.   On her last visit she complained of dizziness.  Her HR was low so I stopped  cardizem and added amlodipine.  Also on 48 hour holter she was in SR 56 to 60 with occ episodes of sinus brady brief to 42 and occ PACs and PVCs.  Her echo with EF of 55 to 60% G2DD, mild Aortic regurg. LA mildly dialted. PA pk pressure 37 mmHg.   Today she feels better with less palpitations and less dizziness.  She does complain of hair loss.  This began with amlodipine.  She had hair loss with lisinopril as well.  No chest pain and no SOB.     November 26, 2016:  Ms. Cloke is seen for the first time today.  Seen with [name] ( sister)  She is a transfer from Dr. Mare Ferrari. She has a history of hypertension and bradycardia.  BP has been up and down.  She takes it at home  BP has been as high as 185 and as low as 125 Goes out to eat once a week .   Does not pay attention to the salt in her food.   Tries to avoid fried foods.    Has rare episodes of palpitations . Perhaps twice a month. Worse when she is lying down  Had a 48 hr monitor  - revealed PACS and PVCs   August 30, 2017:  Seen with Son,  Claiborne Billings  Hx of HTN , blood pressure is high  today.  She makes no effort to avoid salt. No cardiac issues [place] , broke some ribs  Was in the hospital recently for HTN , ( felt some eye pressure )  Still eating some canned foods.   March 09, 2018:  Dona is seen today for follow-up of her hypertension. Has had some swimmyheadedness - has a cold. Has felt poorly for the past for 3 weeks.  Has had a URI for those 3 weeks No syncope   Dec. 11, 2019:   Has a cold today No syncope No dizziness HR is much better off the metoprolol   Dec. 1, 2020  Has felt well until yesterday  Has not felt well today  No fever, no cough. Has some indigestion  Belched lots yesterday  Has been found to have Graves disease since I last saw her .  Has been hyperthyroid and is on Mithimazole   Hospitalized in May, 2020 with acute diastolic congestive heart failure.  She was found to have atrial fibrillation at  that time.  He was started on Eliquis.  December 05, 2019  Sibylla is seen for follow up of her PAF and Graves disease .  Seen with her daughter, [name]. Has glaucoma - getting shots  Had a skin cancer on her face  Has some chronic stasis changes  December 06, 2020: Seen with daughter, Ema Sines is seen today for follow up of her PAF,  Her echocardiogram from April, 2020 she has normal left ventricular systolic function.  She has grade 1 diastolic dysfunction. She has moderate to severe aortic insufficiency. Given her age and frail health, we have treated her conservatively and do not anticipate any surgery for her AI   No cp or dyspnea  Has severe glaucoma ,   Lives up in [place]  No eating as much as she used to  [employer] is 109 lbs  Has mild bilateral carotid artery disease   December 01, 2021 Kadiatu is seen today with her daughter, Octaviano Batty Has PAF, [place]te - severe AI Given her age and fragility, we have made the decision to treat her medically and not anticipate surgery for her AI  Broke her Right hip,  had partial hip replacement  Cannot see very well  Wt is 102 lbs   December 01, 2022 Aydin is seen for follow up of her PAF, moderate - severe aortic insufficiency  Seen with daughter, Octaviano Batty   Has been having some palpitations Has known Graves disease Takes propranolol periodically through the day     Past Medical History:  Diagnosis Date   Anxiety    Arthritis    Broken ribs    Carotid artery disease ([place])    Cataract    Collar bone fracture    Right   Depression    Diverticulosis of colon with hemorrhage    Duodenitis    Esophageal reflux    Esophageal stricture    Essential hypertension    Glaucoma    Hiatal hernia    History of kidney stones     x1    Hyperlipidemia    IBS (irritable bowel syndrome)    Palpitations    UTI (urinary tract infection) March 2015    Past Surgical History:  Procedure Laterality Date    CHOLECYSTECTOMY     COLONOSCOPY     DILATION AND CURETTAGE OF UTERUS     EYE SURGERY Bilateral    total of 5   HIP ARTHROPLASTY  Right 03/16/2021   Procedure: POSTERIOR ARTHROPLASTY BIPOLAR HIP (HEMIARTHROPLASTY);  Surgeon: [name], MD;  Location: [facility];  Service: Orthopedics;  Laterality: Right;   LASER PHOTO ABLATION Right 10/20/2016   Procedure: LASER PHOTO ABLATION;  Surgeon: Hayden Pedro, MD;  Location: [facility];  Service: Ophthalmology;  Laterality: Right;  Headscope laser   LEFT ROTATOR CUFF REPAIR X2 Left    PARS PLANA VITRECTOMY  10/20/2016   WITH 25G REMOVAL/SUTURE SECONDARY INTRAOCULAR LENS, GAS FLUID EXCHANGE    PARS PLANA VITRECTOMY Right 10/20/2016   Procedure: PARS PLANA VITRECTOMY WITH 25G REMOVAL/SUTURE SECONDARY INTRAOCULAR LENS, GAS FLUID EXCHANGE;  Surgeon: Hayden Pedro, MD;  Location: [facility];  Service: Ophthalmology;  Laterality: Right;   RIGHT ROTATOR CUFF REPAIR X1 Right    UPPER GASTROINTESTINAL ENDOSCOPY     with dilation     Current Outpatient Medications  Medication Sig Dispense Refill   acetaminophen (TYLENOL) 325 MG tablet Take 2 tablets (650 mg total) by mouth every 6 (six) hours as needed for headache or mild pain (Rib cage pain). 100 tablet 0   amLODipine (NORVASC) 5 MG tablet Take 1 tablet (5 mg total) by mouth daily. For BP 30 tablet 11   apixaban (ELIQUIS) 2.5 MG TABS tablet TAKE  (1)  TABLET TWICE A DAY. 180 tablet 1   carvedilol (COREG) 3.125 MG tablet TAKE  (1)  TABLET TWICE A DAY WITH MEALS (BREAKFAST AND SUPPER) 180 tablet 1   Cholecalciferol (VITAMIN D3) 1.25 MG (50000 UT) TABS Take 1 capsule by mouth every 7 (seven) days. Monday     fluticasone (FLONASE) 50 MCG/ACT nasal spray PLACE 2 SPRAYS INTO BOTH NOSTRILS AS NEEDED FOR ALLERGIES OR RHINITIS 16 g 5   lidocaine (LIDODERM) 5 % Place 1 patch onto the skin daily. Apply to Rib  pain area daily ----Remove & Discard patch within 12 hours or as directed by MD 30 patch 0   losartan (COZAAR) 50 MG  tablet Take 1 tablet (50 mg total) by mouth daily. FOR BP 30 tablet 2   methimazole (TAPAZOLE) 5 MG tablet Take 1 tablet (5 mg total) by mouth daily. 90 tablet 3   methocarbamol (ROBAXIN) 500 MG tablet Take 1 tablet (500 mg total) by mouth 2 (two) times daily. Muscle relaxant 60 tablet 1   nitroGLYCERIN (NITROSTAT) 0.4 MG SL tablet Place 1 tablet (0.4 mg total) under the tongue every 5 (five) minutes as needed for chest pain. MAX 2 TAB 20 tablet 0   pantoprazole (PROTONIX) 40 MG tablet Take 1 tablet (40 mg total) by mouth daily. 90 tablet 1   polyethylene glycol (MIRALAX / GLYCOLAX) 17 g packet Take 17 g by mouth daily. 30 each 3   polyvinyl alcohol (LIQUIFILM TEARS) 1.4 % ophthalmic solution Place 1 drop into both eyes as needed for dry eyes.     pravastatin (PRAVACHOL) 10 MG tablet Take 1 tablet (10 mg total) by mouth daily. 90 tablet 1   No current facility-administered medications for this visit.    Allergies:   Aricept [donepezil hcl], Lisinopril, Sulfa antibiotics, and Sulfasalazine    Social History:  The patient  reports that she has never smoked. She has never used smokeless tobacco. She reports that she does not drink alcohol and does not use drugs.   Family History:  The patient's family history includes Bladder Cancer in her brother; Colon cancer (age of onset: 63) in her father; Kidney cancer in her brother; Leukemia in her mother; Stroke in  her sister.    Review of systems:    As per current history.  Otherwise the review of systems is negative.   Physical Exam: Blood pressure 120/80, pulse 67, height 5' (1.524 m), weight 97 lb 6.4 oz (44.2 kg), SpO2 94 %.       GEN:  Well nourished, well developed in no acute distress HEENT: Normal NECK: No JVD; No carotid bruits LYMPHATICS: No lymphadenopathy CARDIAC: irreg.  , frequent premature beats  RESPIRATORY:  Clear to auscultation without rales, wheezing or rhonchi  ABDOMEN: Soft, non-tender, non-distended MUSCULOSKELETAL:   No edema; No deformity  SKIN: Warm and dry NEUROLOGIC:  Alert and oriented x 3     EKG:       Recent Labs: 07/27/2022: ALT 9 08/30/2022: TSH 3.072 08/31/2022: BUN 21; Creatinine, Ser 0.95; Hemoglobin 12.8; Platelets 304; Potassium 4.2; Sodium 137   Lipid Panel    Component Value Date/Time   CHOL 203 (H) 07/27/2022 0907   TRIG 119 07/27/2022 0907   HDL 67 07/27/2022 0907   CHOLHDL 3.0 07/27/2022 0907   CHOLHDL 4 12/05/2014 0946   VLDL 26.0 12/05/2014 0946   LDLCALC 115 (H) 07/27/2022 0907     Other studies Reviewed: Additional studies/ records that were reviewed today include:  Echo 07/31/16 Study Conclusions   - Left ventricle: The cavity size was normal. There was mild focal   basal hypertrophy of the septum. Systolic function was normal.   The estimated ejection fraction was in the range of 55% to 60%.   Wall motion was normal; there were no regional wall motion   abnormalities. Features are consistent with a pseudonormal left   ventricular filling pattern, with concomitant abnormal relaxation   and increased filling pressure (grade 2 diastolic dysfunction). - Aortic valve: There was mild regurgitation. - Mitral valve: Calcified annulus. There was mild regurgitation. - Left atrium: The atrium was mildly dilated. - Pulmonary arteries: Systolic pressure was mildly increased. PA   peak pressure: 37 mm Hg (S).    ASSESSMENT AND PLAN:  1.  HTN:        2.  Paroxysmal atrial fibrillation:     3. Aortic Insufficiency :       4. Hyperlipidemia:      4. Carotid disease:       5.  Generalized fatigue.     Current medicines are reviewed with the patient today.  The patient Has no concerns regarding medicines.  The following changes have been made:  See above Labs/ tests ordered today include:see above    Signed, Mertie Moores, MD  12/01/2022 3:36 PM    [address], [place], [place] Phone: [phone]; Fax: [phone]

## 2022-12-01 ENCOUNTER — Encounter: Payer: Self-pay | Admitting: Cardiovascular Disease

## 2022-12-01 ENCOUNTER — Ambulatory Visit: Payer: Medicare PPO | Attending: Cardiovascular Disease | Admitting: Cardiovascular Disease

## 2022-12-01 VITALS — BP 120/80 | HR 67 | Ht 60.0 in | Wt 97.4 lb

## 2022-12-01 DIAGNOSIS — I10 Essential (primary) hypertension: Secondary | ICD-10-CM

## 2022-12-01 DIAGNOSIS — Z87898 Personal history of other specified conditions: Secondary | ICD-10-CM | POA: Diagnosis not present

## 2022-12-01 NOTE — Patient Instructions (Signed)
Medication Instructions:  Your physician recommends that you continue on your current medications as directed. Please refer to the Current Medication list given to you today.  *If you need a refill on your cardiac medications before your next appointment, please call your pharmacy*   Lab Work: NONE If you have labs (blood work) drawn today and your tests are completely normal, you will receive your results only by: [place] (if you have MyChart) OR A paper copy in the mail If you have any lab test that is abnormal or we need to change your treatment, we will call you to review the results.   Testing/Procedures: NONE   Follow-Up: At [facility], you and your health needs are our priority.  As part of our continuing mission to provide you with exceptional heart care, we have created designated Provider Care Teams.  These Care Teams include your primary Cardiologist (physician) and Advanced Practice Providers (APPs -  Physician Assistants and Nurse Practitioners) who all work together to provide you with the care you need, when you need it.  We recommend signing up for the patient portal called "MyChart".  Sign up information is provided on this After Visit Summary.  MyChart is used to connect with patients for Virtual Visits (Telemedicine).  Patients are able to view lab/test results, encounter notes, upcoming appointments, etc.  Non-urgent messages can be sent to your provider as well.   To learn more about what you can do with MyChart, go to [email or web address].    Your next appointment:   1 year(s)  Provider:   Mertie Moores, MD

## 2022-12-03 ENCOUNTER — Ambulatory Visit: Payer: Medicare PPO | Admitting: Internal Medicine

## 2022-12-03 ENCOUNTER — Encounter: Payer: Self-pay | Admitting: Internal Medicine

## 2022-12-03 VITALS — BP 128/72 | HR 72 | Ht 60.0 in | Wt 97.0 lb

## 2022-12-03 DIAGNOSIS — E0500 Thyrotoxicosis with diffuse goiter without thyrotoxic crisis or storm: Secondary | ICD-10-CM | POA: Diagnosis not present

## 2022-12-03 DIAGNOSIS — E0590 Thyrotoxicosis, unspecified without thyrotoxic crisis or storm: Secondary | ICD-10-CM | POA: Diagnosis not present

## 2022-12-03 LAB — T4, FREE: Free T4: 0.9 ng/dL (ref 0.60–1.60)

## 2022-12-03 LAB — TSH: TSH: 1.69 u[IU]/mL (ref 0.35–5.50)

## 2022-12-03 MED ORDER — METHIMAZOLE 5 MG PO TABS: 2.5000 mg | ORAL_TABLET | Freq: Every day | ORAL | 3 refills | Status: DC

## 2022-12-03 NOTE — Progress Notes (Signed)
Name: Valerie [name]  MRN/ DOB: [id number], 08-21-1928    Age/ Sex: 89 y.o., female     PCP: Chevis Pretty, FNP   Reason for Endocrinology Evaluation: hyperthyroidism     Initial Endocrinology Clinic Visit: 01/24/2019    PATIENT IDENTIFIER: Valerie [name] is a 89 y.o., female with a past medical history of HTN, Hyperlipidemia, CHF/CAD and carotid artery stenosis . She has followed with [facility] since 01/24/2019 for consultative assistance with management of her hyperthyroidism.   HISTORICAL SUMMARY: The patient was first diagnosed with hyperthyroidism in 12/2018 with a TSH of 0.179 uIU/mL with an elevated FT4 and T3 during evaluation for near syncope in the ED. She was started on Atenolol and Methimazole at the time.   She did present to ED with SoB in 01/2019 and was diagnosed with new onset A.Fib    SUBJECTIVE:    Today (12/03/2022):  [name] is here for a follow up on hyperthyroidism secondary to graves' disease. She is accompanied by a family member   She had a follow up with cardiology 11/2022 and ED visit for chest pain 10/2022  Weight stable  She is blind due to glaucoma  Continues with  palpitations  Continues with occasional diarrhea  Has occasional local  neck popping and swelling at the back  She does have anxiety   Methimazole 5 mg , half a tablet daily     HISTORY:  Past Medical History:  Past Medical History:  Diagnosis Date   Anxiety    Arthritis    Broken ribs    Carotid artery disease ([place])    Cataract    Collar bone fracture    Right   Depression    Diverticulosis of colon with hemorrhage    Duodenitis    Esophageal reflux    Esophageal stricture    Essential hypertension    Glaucoma    Hiatal hernia    History of kidney stones     x1    Hyperlipidemia    IBS (irritable bowel syndrome)    Palpitations    UTI (urinary tract infection) March 2015   Past Surgical History:  Past Surgical History:  Procedure  Laterality Date   CHOLECYSTECTOMY     COLONOSCOPY     DILATION AND CURETTAGE OF UTERUS     EYE SURGERY Bilateral    total of 5   HIP ARTHROPLASTY Right 03/16/2021   Procedure: POSTERIOR ARTHROPLASTY BIPOLAR HIP (HEMIARTHROPLASTY);  Surgeon: [name], MD;  Location: [facility];  Service: Orthopedics;  Laterality: Right;   LASER PHOTO ABLATION Right 10/20/2016   Procedure: LASER PHOTO ABLATION;  Surgeon: Hayden Pedro, MD;  Location: [facility];  Service: Ophthalmology;  Laterality: Right;  Headscope laser   LEFT ROTATOR CUFF REPAIR X2 Left    PARS PLANA VITRECTOMY  10/20/2016   WITH 25G REMOVAL/SUTURE SECONDARY INTRAOCULAR LENS, GAS FLUID EXCHANGE    PARS PLANA VITRECTOMY Right 10/20/2016   Procedure: PARS PLANA VITRECTOMY WITH 25G REMOVAL/SUTURE SECONDARY INTRAOCULAR LENS, GAS FLUID EXCHANGE;  Surgeon: Hayden Pedro, MD;  Location: [facility];  Service: Ophthalmology;  Laterality: Right;   RIGHT ROTATOR CUFF REPAIR X1 Right    UPPER GASTROINTESTINAL ENDOSCOPY     with dilation   Social History:  reports that she has never smoked. She has never used smokeless tobacco. She reports that she does not drink alcohol and does not use drugs. Family History:  Family History  Problem Relation Age of  Onset   Leukemia Mother    Colon cancer Father 74   Kidney cancer Brother    Bladder Cancer Brother    Stroke Sister    Esophageal cancer Neg Hx    Stomach cancer Neg Hx      HOME MEDICATIONS: Allergies as of 12/03/2022       Reactions   Aricept [donepezil Hcl] Other (See Comments)   Nightmares, near syncope, weak, decreased appetite.   Lisinopril Other (See Comments)   Hair loss   Sulfa Antibiotics Nausea Only   Sulfasalazine Nausea Only        Medication List        Accurate as of December 03, 2022  2:54 PM. If you have any questions, ask your nurse or doctor.          acetaminophen 325 MG tablet Commonly known as: TYLENOL Take 2 tablets (650 mg total) by mouth every 6 (six) hours as  needed for headache or mild pain (Rib cage pain).   amLODipine 5 MG tablet Commonly known as: NORVASC Take 1 tablet (5 mg total) by mouth daily. For BP   apixaban 2.5 MG Tabs tablet Commonly known as: Eliquis TAKE  (1)  TABLET TWICE A DAY.   carvedilol 3.125 MG tablet Commonly known as: COREG TAKE  (1)  TABLET TWICE A DAY WITH MEALS (BREAKFAST AND SUPPER)   fluticasone 50 MCG/ACT nasal spray Commonly known as: FLONASE PLACE 2 SPRAYS INTO BOTH NOSTRILS AS NEEDED FOR ALLERGIES OR RHINITIS   lidocaine 5 % Commonly known as: LIDODERM Place 1 patch onto the skin daily. Apply to Rib  pain area daily ----Remove & Discard patch within 12 hours or as directed by MD   losartan 50 MG tablet Commonly known as: Cozaar Take 1 tablet (50 mg total) by mouth daily. FOR BP   methimazole 5 MG tablet Commonly known as: TAPAZOLE Take 1 tablet (5 mg total) by mouth daily.   methocarbamol 500 MG tablet Commonly known as: ROBAXIN Take 1 tablet (500 mg total) by mouth 2 (two) times daily. Muscle relaxant   nitroGLYCERIN 0.4 MG SL tablet Commonly known as: Nitrostat Place 1 tablet (0.4 mg total) under the tongue every 5 (five) minutes as needed for chest pain. MAX 2 TAB   pantoprazole 40 MG tablet Commonly known as: PROTONIX Take 1 tablet (40 mg total) by mouth daily.   polyethylene glycol 17 g packet Commonly known as: MIRALAX / GLYCOLAX Take 17 g by mouth daily.   polyvinyl alcohol 1.4 % ophthalmic solution Commonly known as: LIQUIFILM TEARS Place 1 drop into both eyes as needed for dry eyes.   pravastatin 10 MG tablet Commonly known as: PRAVACHOL Take 1 tablet (10 mg total) by mouth daily.   propranolol 10 MG tablet Commonly known as: INDERAL Take 10 mg by mouth 4 (four) times daily as needed.   Vitamin D3 1.25 MG (50000 UT) Tabs Generic drug: Cholecalciferol Take 1 capsule by mouth every 7 (seven) days. Monday          DATA REVIEWED:   Latest Reference Range & Units  12/03/22 08:44  TSH 0.35 - 5.50 uIU/mL 1.69  T4,Free(Direct) 0.60 - 1.60 ng/dL 0.90    Latest Reference Range & Units 08/31/22 05:27  Sodium 135 - 145 mmol/L 137  Potassium 3.5 - 5.1 mmol/L 4.2  Chloride 98 - 111 mmol/L 103  CO2 22 - 32 mmol/L 26  Glucose 70 - 99 mg/dL 104 (H)  BUN 8 - 23 mg/dL  21  Creatinine 0.44 - 1.00 mg/dL 0.95  Calcium 8.9 - 10.3 mg/dL 9.2  Anion gap 5 - 15  8  GFR, Estimated >60 mL/min 56 (L)    Latest Reference Range & Units 08/31/22 05:27  WBC 4.0 - 10.5 K/uL 8.3  RBC 3.87 - 5.11 MIL/uL 3.79 (L)  Hemoglobin 12.0 - 15.0 g/dL 12.8  HCT 36.0 - 46.0 % 37.9  MCV 80.0 - 100.0 fL 100.0  MCH 26.0 - 34.0 pg 33.8  MCHC 30.0 - 36.0 g/dL 33.8  RDW 11.5 - 15.5 % 12.5  Platelets 150 - 400 K/uL 304  nRBC 0.0 - 0.2 % 0.0     Results for DEAVIAN, SAWADA (MRN [id number]) as of 03/22/2019 08:11  Ref. Range 02/06/2019 15:57  Thyrotropin Receptor Ab Latest Ref Range: 0.00 - 1.75 IU/L 3.12 (H)    ASSESSMENT / PLAN / RECOMMENDATIONS:   Hyperthyroidism Secondary to Graves' Disease:  - Pt with palpitations, she is under the impression this is caused by her thyroid condition, I assured the patient that with normal thyroid function test, palpitations cannot be attributed to her thyroid - No local neck symptoms.  - No side effects to methimazole - Repeat TFT's are normal, no change    Medications  Continue Methimazole 2.5 mg daily    2. Graves' Disease:   - No extra-thyroidal manifestations of graves' disease.     F/u in 1 yr    Signed electronically by: Mack Guise, MD  [facility]  [address] [place], [place] [address] Phone: [phone] FAX: [phone]      CC: Chevis Pretty, [place] [address] Phone: [phone]  Fax: [phone]   Return to Endocrinology clinic as below: Future Appointments  Date Time Provider [place]  01/25/2023   8:30 AM Chevis Pretty, FNP [facility] None  12/03/2023  9:10 AM Tamikka Pilger, Melanie Crazier, MD [facility] None

## 2022-12-04 LAB — T3: T3, Total: 102 ng/dL (ref 76–181)

## 2022-12-11 ENCOUNTER — Telehealth: Payer: Medicare PPO | Admitting: Family Medicine

## 2022-12-11 DIAGNOSIS — R399 Unspecified symptoms and signs involving the genitourinary system: Secondary | ICD-10-CM

## 2022-12-11 DIAGNOSIS — N390 Urinary tract infection, site not specified: Secondary | ICD-10-CM | POA: Diagnosis not present

## 2022-12-11 DIAGNOSIS — M549 Dorsalgia, unspecified: Secondary | ICD-10-CM | POA: Diagnosis not present

## 2022-12-11 NOTE — Progress Notes (Signed)
I tried to contact the patient 3-4 times and sent video links on 3 different occasions throughout the day, this point if they call to see if we can reschedule them.

## 2022-12-22 ENCOUNTER — Ambulatory Visit (INDEPENDENT_AMBULATORY_CARE_PROVIDER_SITE_OTHER): Payer: Medicare PPO | Admitting: Nurse Practitioner

## 2022-12-22 ENCOUNTER — Telehealth: Payer: Medicare PPO | Admitting: Nurse Practitioner

## 2022-12-22 ENCOUNTER — Encounter: Payer: Self-pay | Admitting: Nurse Practitioner

## 2022-12-22 ENCOUNTER — Other Ambulatory Visit: Payer: Self-pay | Admitting: *Deleted

## 2022-12-22 VITALS — BP 144/80 | HR 59 | Temp 97.7°F | Resp 20 | Ht 60.0 in | Wt 98.0 lb

## 2022-12-22 DIAGNOSIS — R399 Unspecified symptoms and signs involving the genitourinary system: Secondary | ICD-10-CM

## 2022-12-22 DIAGNOSIS — Z87440 Personal history of urinary (tract) infections: Secondary | ICD-10-CM | POA: Diagnosis not present

## 2022-12-22 DIAGNOSIS — N3000 Acute cystitis without hematuria: Secondary | ICD-10-CM

## 2022-12-22 DIAGNOSIS — N390 Urinary tract infection, site not specified: Secondary | ICD-10-CM | POA: Diagnosis not present

## 2022-12-22 LAB — MICROSCOPIC EXAMINATION: Renal Epithel, UA: NONE SEEN /hpf

## 2022-12-22 LAB — URINALYSIS, COMPLETE
Bilirubin, UA: NEGATIVE
Glucose, UA: NEGATIVE
Ketones, UA: NEGATIVE
Nitrite, UA: NEGATIVE
Specific Gravity, UA: 1.02 (ref 1.005–1.030)
Urobilinogen, Ur: 0.2 mg/dL (ref 0.2–1.0)
pH, UA: 6 (ref 5.0–7.5)

## 2022-12-22 MED ORDER — DOXYCYCLINE HYCLATE 100 MG PO TABS: 100.0000 mg | ORAL_TABLET | Freq: Two times a day (BID) | ORAL | 0 refills | Status: DC

## 2022-12-22 MED ORDER — LOSARTAN POTASSIUM 50 MG PO TABS: 50.0000 mg | ORAL_TABLET | Freq: Every day | ORAL | 0 refills | Status: DC

## 2022-12-22 NOTE — Progress Notes (Signed)
Subjective:    Patient ID: Valerie [name], female    DOB: [birth year], 89 y.o.   MRN: [id number]   Chief Complaint: dysuria   Patient went to urgent care on 11/12/22 and was treated for UTI with keflex. She says she is no better . Having hallucinations- seeing flowers.   Urinary Tract Infection  This is a new problem. The current episode started in the past 7 days. The problem occurs every urination. The problem has been waxing and waning. The quality of the pain is described as burning. The pain is moderate. She is Not sexually active. There is No history of pyelonephritis. Associated symptoms include chills, frequency, hesitancy and urgency. Pertinent negatives include no flank pain. She has tried nothing for the symptoms. The treatment provided mild relief.    Patient Active Problem List   Diagnosis Date Noted   Syncope and collapse 08/30/2022   Closed Rt rib fracture -3rd thru 7th 08/30/2022   Compression fracture of T11 vertebra ([place]) 07/21/2022   Malnutrition of moderate degree 03/18/2021   Essential hypertension 03/16/2021   Femoral neck fracture ([place]) 03/15/2021   Hip fracture ([place]) 03/15/2021   Moderate to severe aortic insufficiency 12/05/2019   Paroxysmal Atrial fibrillation (HCC) 03/06/2019   Congestive heart failure (CHF) ([place]) 02/17/2019   Left thyroid nodule 01/24/2019   Hyperthyroidism 01/14/2019   Bradycardia 04/06/2018   PVC (premature ventricular contraction) 11/26/2016   Hyperlipidemia 11/26/2016   Dislocated IOL (intraocular lens), posterior, right 10/20/2016   Posterior dislocation of iol (intraocular lens), right eye 10/20/2016   History of palpitations 06/30/2016   Blurry vision, bilateral    Glaucoma 05/25/2016   Thyroid nodule 05/20/2015   Carotid stenosis, non-symptomatic 09/14/2012   Internal and external hemorrhoids without complication 99991111   Vitamin B12 deficiency 09/15/2011   Benign hypertensive heart disease without heart failure 05/22/2011    Stricture esophagus 05/18/2011   Stricture of duodenum 05/18/2011   Iron deficiency anemia 05/15/2011   Diverticulosis of colon 02/03/2008       Review of Systems  Constitutional:  Positive for chills.  Genitourinary:  Positive for frequency, hesitancy and urgency. Negative for flank pain.       Objective:   Physical Exam Vitals reviewed.  Constitutional:      Appearance: Normal appearance.  Cardiovascular:     Rate and Rhythm: Normal rate and regular rhythm.     Heart sounds: Normal heart sounds.  Pulmonary:     Effort: Pulmonary effort is normal.     Breath sounds: Normal breath sounds.  Abdominal:     Tenderness: There is abdominal tenderness (suprapubic pressure). There is no right CVA tenderness or left CVA tenderness.  Skin:    General: Skin is warm.  Neurological:     General: No focal deficit present.     Mental Status: She is alert and oriented to person, place, and time.  Psychiatric:        Mood and Affect: Mood normal.        Behavior: Behavior normal.     BP (!) 144/80   Pulse (!) 59   Temp 97.7 F (36.5 C) (Temporal)   Resp 20   Ht 5' (1.524 m)   Wt 98 lb (44.5 kg)   SpO2 95%   BMI 19.14 kg/m         Assessment & Plan:   Valerie [name] in today with chief complaint of No chief complaint on file.   1. Recent urinary tract infection -  Urinalysis, Complete - Urine Culture  2. UTI symptoms Take medication as prescribe Cotton underwear Take shower not bath Cranberry juice, yogurt Force fluids AZO over the counter X2 days Culture pending RTO prn  - doxycycline (VIBRA-TABS) 100 MG tablet; Take 1 tablet (100 mg total) by mouth 2 (two) times daily. 1 po bid  Dispense: 20 tablet; Refill: 0    The above assessment and management plan was discussed with the patient. The patient verbalized understanding of and has agreed to the management plan. Patient is aware to call the clinic if symptoms persist or worsen. Patient is aware when to  return to the clinic for a follow-up visit. Patient educated on when it is appropriate to go to the emergency department.   Mary-Margaret Hassell Done, FNP

## 2022-12-22 NOTE — Progress Notes (Signed)
Spoke with patient and Daughter and explained limitation of E-visits. Encouraged follow up with Primary care given currently symptoms   Because of your symptoms, I feel your condition warrants further evaluation and I recommend that you be seen for a face to face visit.    Please contact your primary care physician practice to be seen. Many offices offer virtual options to be seen via video if you are not comfortable going in person to a medical facility at this time.  NOTE: You will NOT be charged for this eVisit.  If you do not have a PCP, [facility] offers a free physician referral service available at [phone]. Our trained staff has the experience, knowledge and resources to put you in touch with a physician who is right for you.   If you cannot be seen in office today, please visit a local Urgent Care   If you are having a true medical emergency please call 911.   Your e-visit answers were reviewed by a board certified advanced clinical practitioner to complete your personal care plan.  Thank you for using e-Visits.

## 2022-12-23 LAB — URINE CULTURE

## 2022-12-24 ENCOUNTER — Telehealth: Payer: Self-pay | Admitting: Nurse Practitioner

## 2022-12-24 NOTE — Telephone Encounter (Signed)
Pt says the medicine that MMM prescribed her for her UTI is not working or she is having a reaction from it or something.. She says she doesn't know exactly but says she feels dizzy and is seeing things. Needs to know what MMM thinks and advises.

## 2022-12-24 NOTE — Telephone Encounter (Signed)
Probably not coming from antibiotic. Waiting on urine culture to see if need to change antibiotic

## 2022-12-24 NOTE — Telephone Encounter (Signed)
Spoke with patient and advised to continue the medication and patient verbalized understanding. Advised that her symptoms were probably coming from the infection. Advised patient to contact the office if symptoms worsen. Patient verbalized understanding

## 2022-12-28 ENCOUNTER — Telehealth: Payer: Self-pay | Admitting: Nurse Practitioner

## 2022-12-28 NOTE — Telephone Encounter (Signed)
  Incoming Patient Call  12/28/2022  What symptoms do you have? Seeing things and flowers, pain when she urinates, and pees alot  How long have you been sick? Since last week  Have you been seen for this problem? Yes last week by MMM, told to call if not better and pt is not better  If your provider decides to give you a prescription, which pharmacy would you like for it to be sent to? [facility]   Patient informed that this information will be sent to the clinical staff for review and that they should receive a follow up call.

## 2022-12-28 NOTE — Telephone Encounter (Signed)
Spanish interpreter left a detailed message on pts vmail of urine culture results and that macrobid has been sent to pts pharmacy. Informed that all blood work was normal.

## 2022-12-28 NOTE — Telephone Encounter (Signed)
Valerie [name] informed. She is not sure if pt has a specimen cup. She will come by the office and pick up if not.

## 2022-12-28 NOTE — Telephone Encounter (Signed)
Patient calling back because she is in pain and wants to know what can be done. Made her aware of previous message.

## 2022-12-28 NOTE — Telephone Encounter (Signed)
Her urine culture did not grow anything. Please have family bring in another urine specimen.

## 2022-12-29 ENCOUNTER — Other Ambulatory Visit: Payer: Self-pay

## 2022-12-29 ENCOUNTER — Other Ambulatory Visit: Payer: Medicare PPO

## 2022-12-29 DIAGNOSIS — R399 Unspecified symptoms and signs involving the genitourinary system: Secondary | ICD-10-CM

## 2022-12-29 LAB — URINALYSIS, COMPLETE
Bilirubin, UA: NEGATIVE
Glucose, UA: NEGATIVE
Ketones, UA: NEGATIVE
Leukocytes,UA: NEGATIVE
Nitrite, UA: NEGATIVE
Protein,UA: NEGATIVE
RBC, UA: NEGATIVE
Specific Gravity, UA: 1.01 (ref 1.005–1.030)
Urobilinogen, Ur: 0.2 mg/dL (ref 0.2–1.0)
pH, UA: 6.5 (ref 5.0–7.5)

## 2022-12-29 LAB — MICROSCOPIC EXAMINATION
Bacteria, UA: NONE SEEN
Epithelial Cells (non renal): NONE SEEN /hpf (ref 0–10)
RBC, Urine: NONE SEEN /hpf (ref 0–2)
Renal Epithel, UA: NONE SEEN /hpf
WBC, UA: NONE SEEN /hpf (ref 0–5)

## 2022-12-31 ENCOUNTER — Encounter: Payer: Self-pay | Admitting: Nurse Practitioner

## 2022-12-31 ENCOUNTER — Ambulatory Visit (INDEPENDENT_AMBULATORY_CARE_PROVIDER_SITE_OTHER): Payer: Medicare PPO | Admitting: Nurse Practitioner

## 2022-12-31 VITALS — BP 154/78 | HR 71 | Temp 97.3°F | Resp 20 | Ht 60.0 in | Wt 98.0 lb

## 2022-12-31 DIAGNOSIS — F419 Anxiety disorder, unspecified: Secondary | ICD-10-CM

## 2022-12-31 LAB — URINE CULTURE: Organism ID, Bacteria: NO GROWTH

## 2022-12-31 MED ORDER — CITALOPRAM HYDROBROMIDE 20 MG PO TABS: 20.0000 mg | ORAL_TABLET | Freq: Every day | ORAL | 5 refills | Status: DC

## 2022-12-31 NOTE — Patient Instructions (Signed)
Fall Prevention in the Home, Adult Falls can cause injuries and can happen to people of all ages. There are many things you can do to make your home safer and to help prevent falls. What actions can I take to prevent falls? General information Use good lighting in all rooms. Make sure to: Replace any light bulbs that burn out. Turn on the lights in dark areas and use night-lights. Keep items that you use often in easy-to-reach places. Lower the shelves around your home if needed. Move furniture so that there are clear paths around it. Do not use throw rugs or other things on the floor that can make you trip. If any of your floors are uneven, fix them. Add color or contrast paint or tape to clearly mark and help you see: Grab bars or handrails. First and last steps of staircases. Where the edge of each step is. If you use a ladder or stepladder: Make sure that it is fully opened. Do not climb a closed ladder. Make sure the sides of the ladder are locked in place. Have someone hold the ladder while you use it. Know where your pets are as you move through your home. What can I do in the bathroom?     Keep the floor dry. Clean up any water on the floor right away. Remove soap buildup in the bathtub or shower. Buildup makes bathtubs and showers slippery. Use non-skid mats or decals on the floor of the bathtub or shower. Attach bath mats securely with double-sided, non-slip rug tape. If you need to sit down in the shower, use a non-slip stool. Install grab bars by the toilet and in the bathtub and shower. Do not use towel bars as grab bars. What can I do in the bedroom? Make sure that you have a light by your bed that is easy to reach. Do not use any sheets or blankets on your bed that hang to the floor. Have a firm chair or bench with side arms that you can use for support when you get dressed. What can I do in the kitchen? Clean up any spills right away. If you need to reach something  above you, use a step stool with a grab bar. Keep electrical cords out of the way. Do not use floor polish or wax that makes floors slippery. What can I do with my stairs? Do not leave anything on the stairs. Make sure that you have a light switch at the top and the bottom of the stairs. Make sure that there are handrails on both sides of the stairs. Fix handrails that are broken or loose. Install non-slip stair treads on all your stairs if they do not have carpet. Avoid having throw rugs at the top or bottom of the stairs. Choose a carpet that does not hide the edge of the steps on the stairs. Make sure that the carpet is firmly attached to the stairs. Fix carpet that is loose or worn. What can I do on the outside of my home? Use bright outdoor lighting. Fix the edges of walkways and driveways and fix any cracks. Clear paths of anything that can make you trip, such as tools or rocks. Add color or contrast paint or tape to clearly mark and help you see anything that might make you trip as you walk through a door, such as a raised step or threshold. Trim any bushes or trees on paths to your home. Check to see if handrails are loose   or broken and that both sides of all steps have handrails. Install guardrails along the edges of any raised decks and porches. Have leaves, snow, or ice cleared regularly. Use sand, salt, or ice melter on paths if you live where there is ice and snow during the winter. Clean up any spills in your garage right away. This includes grease or oil spills. What other actions can I take? Review your medicines with your doctor. Some medicines can cause dizziness or changes in blood pressure, which increase your risk of falling. Wear shoes that: Have a low heel. Do not wear high heels. Have rubber bottoms and are closed at the toe. Feel good on your feet and fit well. Use tools that help you move around if needed. These include: Canes. Walkers. Scooters. Crutches. Ask  your doctor what else you can do to help prevent falls. This may include seeing a physical therapist to learn to do exercises to move better and get stronger. Where to find more information Centers for Disease Control and Prevention, [employer]: cdc.gov National Institute on Aging: nia.nih.gov National Institute on Aging: nia.nih.gov Contact a doctor if: You are afraid of falling at home. You feel weak, drowsy, or dizzy at home. You fall at home. Get help right away if you: Lose consciousness or have trouble moving after a fall. Have a fall that causes a head injury. These symptoms may be an emergency. Get help right away. Call 911. Do not wait to see if the symptoms will go away. Do not drive yourself to the hospital. This information is not intended to replace advice given to you by your health care provider. Make sure you discuss any questions you have with your health care provider. Document Revised: 05/18/2022 Document Reviewed: 05/18/2022 Elsevier Patient Education  2023 [employer]vier Inc.  

## 2022-12-31 NOTE — Progress Notes (Signed)
Subjective:    Patient ID: Valerie [name], female    DOB: 24-Mar-1928, 89 y.o.   MRN: [id number]   Chief Complaint: Anxiety    HPI:  Valerie [name] is a 89 y.o. who identifies as a female who was assigned female at birth.   Social history: Lives with: by herself Work history: retired   Scientist, forensic in today for follow up of the following chronic medical issues:  There are no diagnoses linked to this encounter.  New complaints: Patient has seen [place], her heart doctor and her endocrinologist in the last several weeks. She has been having hallucinations and anxious. Cardiology and endocrinology both think that she needs some meds for anxiety. Her family says she is stlll hallucinating at times and that makes her very anxious.      12/31/2022    8:47 AM 07/27/2022    8:37 AM 07/02/2022   11:20 AM 01/23/2022   10:53 AM  GAD 7 : Generalized Anxiety Score  Nervous, Anxious, on Edge 2 0 2 2  Control/stop worrying 2 0 2 2  Worry too much - different things 2 0 2 2  Trouble relaxing 2 0 3 0  Restless 1 0 2 0  Easily annoyed or irritable 1 0 1 0  Afraid - awful might happen 2 0 0 0  Total GAD 7 Score 12 0 12 6  Anxiety Difficulty Somewhat difficult Not difficult at all Not difficult at all Not difficult at all       12/31/2022    8:47 AM 07/27/2022    8:37 AM 07/02/2022   11:20 AM  Depression screen PHQ 2/9  Decreased Interest 1 0 3  Down, Depressed, Hopeless 1 0 2  PHQ - 2 Score 2 0 5  Altered sleeping 1 2 2   Tired, decreased energy 1 0 3  Change in appetite 2 0 2  Feeling bad or failure about yourself  0 0 2  Trouble concentrating 1 0 3  Moving slowly or fidgety/restless 1 0 3  Suicidal thoughts 0 0 0  PHQ-9 Score 8 2 20   Difficult doing work/chores Somewhat difficult Not difficult at all Very difficult    Allergies  Allergen Reactions   Aricept [Donepezil Hcl] Other (See Comments)    Nightmares, near syncope, weak, decreased appetite.   Lisinopril Other (See Comments)    Hair  loss   Sulfa Antibiotics Nausea Only   Sulfasalazine Nausea Only   Outpatient Encounter Medications as of 12/31/2022  Medication Sig   acetaminophen (TYLENOL) 325 MG tablet Take 2 tablets (650 mg total) by mouth every 6 (six) hours as needed for headache or mild pain (Rib cage pain).   amLODipine (NORVASC) 5 MG tablet Take 1 tablet (5 mg total) by mouth daily. For BP   apixaban (ELIQUIS) 2.5 MG TABS tablet TAKE  (1)  TABLET TWICE A DAY.   carvedilol (COREG) 3.125 MG tablet TAKE  (1)  TABLET TWICE A DAY WITH MEALS (BREAKFAST AND SUPPER)   Cholecalciferol (VITAMIN D3) 1.25 MG (50000 UT) TABS Take 1 capsule by mouth every 7 (seven) days. Monday   doxycycline (VIBRA-TABS) 100 MG tablet Take 1 tablet (100 mg total) by mouth 2 (two) times daily. 1 po bid   fluticasone (FLONASE) 50 MCG/ACT nasal spray PLACE 2 SPRAYS INTO BOTH NOSTRILS AS NEEDED FOR ALLERGIES OR RHINITIS   lidocaine (LIDODERM) 5 % Place 1 patch onto the skin daily. Apply to Rib  pain area daily ----Remove & Discard patch  within 12 hours or as directed by MD   losartan (COZAAR) 50 MG tablet Take 1 tablet (50 mg total) by mouth daily. FOR BP   methimazole (TAPAZOLE) 5 MG tablet Take 0.5 tablets (2.5 mg total) by mouth daily.   methocarbamol (ROBAXIN) 500 MG tablet Take 1 tablet (500 mg total) by mouth 2 (two) times daily. Muscle relaxant   nitroGLYCERIN (NITROSTAT) 0.4 MG SL tablet Place 1 tablet (0.4 mg total) under the tongue every 5 (five) minutes as needed for chest pain. MAX 2 TAB   pantoprazole (PROTONIX) 40 MG tablet Take 1 tablet (40 mg total) by mouth daily.   polyethylene glycol (MIRALAX / GLYCOLAX) 17 g packet Take 17 g by mouth daily.   polyvinyl alcohol (LIQUIFILM TEARS) 1.4 % ophthalmic solution Place 1 drop into both eyes as needed for dry eyes.   pravastatin (PRAVACHOL) 10 MG tablet Take 1 tablet (10 mg total) by mouth daily.   propranolol (INDERAL) 10 MG tablet Take 10 mg by mouth 4 (four) times daily as needed.   No  facility-administered encounter medications on file as of 12/31/2022.    Past Surgical History:  Procedure Laterality Date   CHOLECYSTECTOMY     COLONOSCOPY     DILATION AND CURETTAGE OF UTERUS     EYE SURGERY Bilateral    total of 5   HIP ARTHROPLASTY Right 03/16/2021   Procedure: POSTERIOR ARTHROPLASTY BIPOLAR HIP (HEMIARTHROPLASTY);  Surgeon: [name], MD;  Location: [facility];  Service: Orthopedics;  Laterality: Right;   LASER PHOTO ABLATION Right 10/20/2016   Procedure: LASER PHOTO ABLATION;  Surgeon: Hayden Pedro, MD;  Location: [facility];  Service: Ophthalmology;  Laterality: Right;  Headscope laser   LEFT ROTATOR CUFF REPAIR X2 Left    PARS PLANA VITRECTOMY  10/20/2016   WITH 25G REMOVAL/SUTURE SECONDARY INTRAOCULAR LENS, GAS FLUID EXCHANGE    PARS PLANA VITRECTOMY Right 10/20/2016   Procedure: PARS PLANA VITRECTOMY WITH 25G REMOVAL/SUTURE SECONDARY INTRAOCULAR LENS, GAS FLUID EXCHANGE;  Surgeon: Hayden Pedro, MD;  Location: [facility];  Service: Ophthalmology;  Laterality: Right;   RIGHT ROTATOR CUFF REPAIR X1 Right    UPPER GASTROINTESTINAL ENDOSCOPY     with dilation    Family History  Problem Relation Age of Onset   Leukemia Mother    Colon cancer Father 26   Kidney cancer Brother    Bladder Cancer Brother    Stroke Sister    Esophageal cancer Neg Hx    Stomach cancer Neg Hx       Controlled substance contract: n/a     Review of Systems  Constitutional:  Negative for diaphoresis.  Eyes:  Negative for pain.  Respiratory:  Negative for shortness of breath.   Cardiovascular:  Negative for chest pain, palpitations and leg swelling.  Gastrointestinal:  Negative for abdominal pain.  Endocrine: Negative for polydipsia.  Skin:  Negative for rash.  Neurological:  Negative for dizziness, weakness and headaches.  Hematological:  Does not bruise/bleed easily.  All other systems reviewed and are negative.      Objective:   Physical Exam Vitals and nursing note  reviewed.  Constitutional:      General: She is not in acute distress.    Appearance: Normal appearance. She is well-developed.  Neck:     Vascular: No carotid bruit or JVD.  Cardiovascular:     Rate and Rhythm: Normal rate and regular rhythm.     Heart sounds: Normal heart sounds.  Pulmonary:  Effort: Pulmonary effort is normal. No respiratory distress.     Breath sounds: Normal breath sounds. No wheezing or rales.  Chest:     Chest wall: No tenderness.  Abdominal:     General: Bowel sounds are normal. There is no distension or abdominal bruit.     Palpations: Abdomen is soft. There is no hepatomegaly, splenomegaly, mass or pulsatile mass.     Tenderness: There is no abdominal tenderness.  Musculoskeletal:        General: Normal range of motion.     Cervical back: Normal range of motion and neck supple.  Lymphadenopathy:     Cervical: No cervical adenopathy.  Skin:    General: Skin is warm and dry.  Neurological:     Mental Status: She is alert and oriented to person, place, and time.     Deep Tendon Reflexes: Reflexes are normal and symmetric.  Psychiatric:        Behavior: Behavior normal.        Thought Content: Thought content normal.        Judgment: Judgment normal.     BP (!) 154/78   Pulse 71   Temp (!) 97.3 F (36.3 C) (Temporal)   Resp 20   Ht 5' (1.524 m)   Wt 98 lb (44.5 kg)   SpO2 94%   BMI 19.14 kg/m         Assessment & Plan:   Valerie [name] in today with chief complaint of Anxiety   1. Anxiety Orient daily Keep close check on her while she is starting her meds  Meds ordered this encounter  Medications   citalopram (CELEXA) 20 MG tablet    Sig: Take 1 tablet (20 mg total) by mouth daily.    Dispense:  30 tablet    Refill:  5    Order Specific Question:   Supervising Provider    Answer:   Caryl Pina A [id number]       The above assessment and management plan was discussed with the patient. The patient verbalized  understanding of and has agreed to the management plan. Patient is aware to call the clinic if symptoms persist or worsen. Patient is aware when to return to the clinic for a follow-up visit. Patient educated on when it is appropriate to go to the emergency department.   Mary-Margaret Hassell Done, FNP

## 2023-01-04 ENCOUNTER — Other Ambulatory Visit: Payer: Self-pay | Admitting: Nurse Practitioner

## 2023-01-07 ENCOUNTER — Telehealth: Payer: Self-pay | Admitting: Nurse Practitioner

## 2023-01-07 DIAGNOSIS — R399 Unspecified symptoms and signs involving the genitourinary system: Secondary | ICD-10-CM

## 2023-01-07 NOTE — Telephone Encounter (Signed)
This probably dementia

## 2023-01-08 NOTE — Telephone Encounter (Signed)
Left message to call back  

## 2023-01-08 NOTE — Telephone Encounter (Signed)
Patient's grandson informed.  He said when her urine was tested on 12/29/22 she was actually on an antibiotic at the time and he wants to retest now that she has not taken it in about 5 or 6 days.  A urine cup will be placed at the front for them to pick up and the lab test was ordered.

## 2023-01-11 ENCOUNTER — Other Ambulatory Visit: Payer: Self-pay | Admitting: Nurse Practitioner

## 2023-01-11 ENCOUNTER — Other Ambulatory Visit: Payer: Medicare PPO

## 2023-01-11 DIAGNOSIS — R399 Unspecified symptoms and signs involving the genitourinary system: Secondary | ICD-10-CM | POA: Diagnosis not present

## 2023-01-11 LAB — URINALYSIS, COMPLETE
Bilirubin, UA: NEGATIVE
Glucose, UA: NEGATIVE
Ketones, UA: NEGATIVE
Leukocytes,UA: NEGATIVE
Nitrite, UA: NEGATIVE
Protein,UA: NEGATIVE
RBC, UA: NEGATIVE
Specific Gravity, UA: 1.01 (ref 1.005–1.030)
Urobilinogen, Ur: 0.2 mg/dL (ref 0.2–1.0)
pH, UA: 7 (ref 5.0–7.5)

## 2023-01-11 LAB — MICROSCOPIC EXAMINATION
Bacteria, UA: NONE SEEN
Epithelial Cells (non renal): NONE SEEN /hpf (ref 0–10)
RBC, Urine: NONE SEEN /hpf (ref 0–2)
Renal Epithel, UA: NONE SEEN /hpf
WBC, UA: NONE SEEN /hpf (ref 0–5)

## 2023-01-12 LAB — URINE CULTURE: Organism ID, Bacteria: NO GROWTH

## 2023-01-25 ENCOUNTER — Encounter: Payer: Self-pay | Admitting: Nurse Practitioner

## 2023-01-25 ENCOUNTER — Ambulatory Visit: Payer: Medicare PPO | Admitting: Nurse Practitioner

## 2023-01-25 VITALS — BP 162/78 | HR 78 | Temp 97.4°F | Resp 20 | Ht 60.0 in | Wt 95.0 lb

## 2023-01-25 DIAGNOSIS — K222 Esophageal obstruction: Secondary | ICD-10-CM

## 2023-01-25 DIAGNOSIS — K5730 Diverticulosis of large intestine without perforation or abscess without bleeding: Secondary | ICD-10-CM | POA: Diagnosis not present

## 2023-01-25 DIAGNOSIS — I6523 Occlusion and stenosis of bilateral carotid arteries: Secondary | ICD-10-CM | POA: Diagnosis not present

## 2023-01-25 DIAGNOSIS — I351 Nonrheumatic aortic (valve) insufficiency: Secondary | ICD-10-CM | POA: Diagnosis not present

## 2023-01-25 DIAGNOSIS — E0590 Thyrotoxicosis, unspecified without thyrotoxic crisis or storm: Secondary | ICD-10-CM

## 2023-01-25 DIAGNOSIS — I119 Hypertensive heart disease without heart failure: Secondary | ICD-10-CM | POA: Diagnosis not present

## 2023-01-25 DIAGNOSIS — E782 Mixed hyperlipidemia: Secondary | ICD-10-CM

## 2023-01-25 DIAGNOSIS — I509 Heart failure, unspecified: Secondary | ICD-10-CM | POA: Diagnosis not present

## 2023-01-25 DIAGNOSIS — E559 Vitamin D deficiency, unspecified: Secondary | ICD-10-CM

## 2023-01-25 DIAGNOSIS — F339 Major depressive disorder, recurrent, unspecified: Secondary | ICD-10-CM

## 2023-01-25 DIAGNOSIS — D509 Iron deficiency anemia, unspecified: Secondary | ICD-10-CM | POA: Diagnosis not present

## 2023-01-25 DIAGNOSIS — E538 Deficiency of other specified B group vitamins: Secondary | ICD-10-CM | POA: Diagnosis not present

## 2023-01-25 DIAGNOSIS — I480 Paroxysmal atrial fibrillation: Secondary | ICD-10-CM

## 2023-01-25 DIAGNOSIS — I10 Essential (primary) hypertension: Secondary | ICD-10-CM | POA: Diagnosis not present

## 2023-01-25 MED ORDER — CARVEDILOL 3.125 MG PO TABS
ORAL_TABLET | ORAL | 1 refills | Status: DC
Start: 2023-01-25 — End: 2023-07-26

## 2023-01-25 MED ORDER — APIXABAN 2.5 MG PO TABS
ORAL_TABLET | ORAL | 1 refills | Status: DC
Start: 2023-01-25 — End: 2023-07-26

## 2023-01-25 MED ORDER — CITALOPRAM HYDROBROMIDE 20 MG PO TABS
20.0000 mg | ORAL_TABLET | Freq: Every day | ORAL | 5 refills | Status: DC
Start: 2023-01-25 — End: 2023-05-24

## 2023-01-25 MED ORDER — AMLODIPINE BESYLATE 5 MG PO TABS
5.0000 mg | ORAL_TABLET | Freq: Every day | ORAL | 11 refills | Status: DC
Start: 2023-01-25 — End: 2023-07-26

## 2023-01-25 MED ORDER — PRAVASTATIN SODIUM 10 MG PO TABS: 10.0000 mg | ORAL_TABLET | Freq: Every day | ORAL | 1 refills | Status: DC

## 2023-01-25 MED ORDER — METHIMAZOLE 5 MG PO TABS
2.5000 mg | ORAL_TABLET | Freq: Every day | ORAL | 3 refills | Status: DC
Start: 2023-01-25 — End: 2023-07-26

## 2023-01-25 MED ORDER — PANTOPRAZOLE SODIUM 40 MG PO TBEC
40.0000 mg | DELAYED_RELEASE_TABLET | Freq: Every day | ORAL | 1 refills | Status: DC
Start: 2023-01-25 — End: 2023-07-26

## 2023-01-25 MED ORDER — PROPRANOLOL HCL 10 MG PO TABS: 10.0000 mg | ORAL_TABLET | Freq: Four times a day (QID) | ORAL | 1 refills | Status: DC | PRN

## 2023-01-25 MED ORDER — VITAMIN D3 1.25 MG (50000 UT) PO TABS: 1.0000 | ORAL_TABLET | ORAL | 1 refills | Status: DC

## 2023-01-25 MED ORDER — LOSARTAN POTASSIUM 50 MG PO TABS: ORAL_TABLET | ORAL | 1 refills | Status: DC

## 2023-01-25 MED ORDER — METHOCARBAMOL 500 MG PO TABS: 500.0000 mg | ORAL_TABLET | Freq: Two times a day (BID) | ORAL | 1 refills | Status: DC

## 2023-01-25 NOTE — Patient Instructions (Signed)
Fall Prevention in the Home, Adult Falls can cause injuries and can happen to people of all ages. There are many things you can do to make your home safer and to help prevent falls. What actions can I take to prevent falls? General information Use good lighting in all rooms. Make sure to: Replace any light bulbs that burn out. Turn on the lights in dark areas and use night-lights. Keep items that you use often in easy-to-reach places. Lower the shelves around your home if needed. Move furniture so that there are clear paths around it. Do not use throw rugs or other things on the floor that can make you trip. If any of your floors are uneven, fix them. Add color or contrast paint or tape to clearly mark and help you see: Grab bars or handrails. First and last steps of staircases. Where the edge of each step is. If you use a ladder or stepladder: Make sure that it is fully opened. Do not climb a closed ladder. Make sure the sides of the ladder are locked in place. Have someone hold the ladder while you use it. Know where your pets are as you move through your home. What can I do in the bathroom?     Keep the floor dry. Clean up any water on the floor right away. Remove soap buildup in the bathtub or shower. Buildup makes bathtubs and showers slippery. Use non-skid mats or decals on the floor of the bathtub or shower. Attach bath mats securely with double-sided, non-slip rug tape. If you need to sit down in the shower, use a non-slip stool. Install grab bars by the toilet and in the bathtub and shower. Do not use towel bars as grab bars. What can I do in the bedroom? Make sure that you have a light by your bed that is easy to reach. Do not use any sheets or blankets on your bed that hang to the floor. Have a firm chair or bench with side arms that you can use for support when you get dressed. What can I do in the kitchen? Clean up any spills right away. If you need to reach something  above you, use a step stool with a grab bar. Keep electrical cords out of the way. Do not use floor polish or wax that makes floors slippery. What can I do with my stairs? Do not leave anything on the stairs. Make sure that you have a light switch at the top and the bottom of the stairs. Make sure that there are handrails on both sides of the stairs. Fix handrails that are broken or loose. Install non-slip stair treads on all your stairs if they do not have carpet. Avoid having throw rugs at the top or bottom of the stairs. Choose a carpet that does not hide the edge of the steps on the stairs. Make sure that the carpet is firmly attached to the stairs. Fix carpet that is loose or worn. What can I do on the outside of my home? Use bright outdoor lighting. Fix the edges of walkways and driveways and fix any cracks. Clear paths of anything that can make you trip, such as tools or rocks. Add color or contrast paint or tape to clearly mark and help you see anything that might make you trip as you walk through a door, such as a raised step or threshold. Trim any bushes or trees on paths to your home. Check to see if handrails are loose   or broken and that both sides of all steps have handrails. Install guardrails along the edges of any raised decks and porches. Have leaves, snow, or ice cleared regularly. Use sand, salt, or ice melter on paths if you live where there is ice and snow during the winter. Clean up any spills in your garage right away. This includes grease or oil spills. What other actions can I take? Review your medicines with your doctor. Some medicines can cause dizziness or changes in blood pressure, which increase your risk of falling. Wear shoes that: Have a low heel. Do not wear high heels. Have rubber bottoms and are closed at the toe. Feel good on your feet and fit well. Use tools that help you move around if needed. These include: Canes. Walkers. Scooters. Crutches. Ask  your doctor what else you can do to help prevent falls. This may include seeing a physical therapist to learn to do exercises to move better and get stronger. Where to find more information Centers for Disease Control and Prevention, [employer]: cdc.gov National Institute on Aging: nia.nih.gov National Institute on Aging: nia.nih.gov Contact a doctor if: You are afraid of falling at home. You feel weak, drowsy, or dizzy at home. You fall at home. Get help right away if you: Lose consciousness or have trouble moving after a fall. Have a fall that causes a head injury. These symptoms may be an emergency. Get help right away. Call 911. Do not wait to see if the symptoms will go away. Do not drive yourself to the hospital. This information is not intended to replace advice given to you by your health care provider. Make sure you discuss any questions you have with your health care provider. Document Revised: 05/18/2022 Document Reviewed: 05/18/2022 Elsevier Patient Education  2023 [employer]vier Inc.  

## 2023-01-25 NOTE — Progress Notes (Signed)
Subjective:    Patient ID: Valerie [name], female    DOB: [birth year], 89 y.o.   MRN: [id number]   Chief Complaint: medical management of chronic issues     HPI:  Valerie [name] is a 89 y.o. who identifies as a female who was assigned female at birth.   Social history: Lives with: by herself but her son stays with her alot Work history: retired   Water engineer in today for follow up of the following chronic medical issues:  1. Essential hypertension No c/o chest pain, sob or headache. Does check blood pressure at home. She has not taken her blood pressure meds this morning BP Readings from Last 3 Encounters:  12/31/22 (!) 154/78  12/22/22 (!) 144/80  12/03/22 128/72     2. Benign hypertensive heart disease without heart failure 3. Congestive heart failure, unspecified HF chronicity, unspecified heart failure type (HCC) 4. Nonrheumatic aortic valve insufficiency 5. Paroxysmal atrial fibrillation [facility]) Last cardiology visit was on 12/01/22. She saw DR.Nahser because her cardiologist has retired. He made no changes  to her current plan of care.  6. Asymptomatic bilateral carotid artery stenosis Last carotid duplex was done 10/21/17 with less than 39% stenosis.  7. Hyperthyroidism No issues that she is aware of. Lab Results  Component Value Date   TSH 1.69 12/03/2022     8. Mixed hyperlipidemia Does not watch diet and does no dedicated  exercise. Lab Results  Component Value Date   CHOL 203 (H) 07/27/2022   HDL 67 07/27/2022   LDLCALC 115 (H) 07/27/2022   TRIG 119 07/27/2022   CHOLHDL 3.0 07/27/2022     9. Diverticulosis of colon No recent flare ups  10. Iron deficiency anemia, unspecified iron deficiency anemia type Has constant fatigue but she does sit around a lot. Lab Results  Component Value Date   HGB 12.8 08/31/2022     11. Vitamin B12 deficiency Is currently on no b12 supplement. Will recheck labs today. Lab Results  Component Value Date   VITAMINB12 268  01/23/2022      New complaints: None today  Allergies  Allergen Reactions   Aricept [Donepezil Hcl] Other (See Comments)    Nightmares, near syncope, weak, decreased appetite.   Lisinopril Other (See Comments)    Hair loss   Sulfa Antibiotics Nausea Only   Sulfasalazine Nausea Only   Outpatient Encounter Medications as of 01/25/2023  Medication Sig   acetaminophen (TYLENOL) 325 MG tablet Take 2 tablets (650 mg total) by mouth every 6 (six) hours as needed for headache or mild pain (Rib cage pain).   amLODipine (NORVASC) 5 MG tablet Take 1 tablet (5 mg total) by mouth daily. For BP   apixaban (ELIQUIS) 2.5 MG TABS tablet TAKE  (1)  TABLET TWICE A DAY.   carvedilol (COREG) 3.125 MG tablet TAKE  (1)  TABLET TWICE A DAY WITH MEALS (BREAKFAST AND SUPPER)   Cholecalciferol (VITAMIN D3) 1.25 MG (50000 UT) TABS Take 1 capsule by mouth every 7 (seven) days. Monday   citalopram (CELEXA) 20 MG tablet Take 1 tablet (20 mg total) by mouth daily.   fluticasone (FLONASE) 50 MCG/ACT nasal spray PLACE 2 SPRAYS INTO BOTH NOSTRILS AS NEEDED FOR ALLERGIES OR RHINITIS   lidocaine (LIDODERM) 5 % Place 1 patch onto the skin daily. Apply to Rib  pain area daily ----Remove & Discard patch within 12 hours or as directed by MD   losartan (COZAAR) 50 MG tablet TAKE ONE TABLET DAILY FOR BLOOD  PRESSURE   methimazole (TAPAZOLE) 5 MG tablet Take 0.5 tablets (2.5 mg total) by mouth daily.   methocarbamol (ROBAXIN) 500 MG tablet Take 1 tablet (500 mg total) by mouth 2 (two) times daily. Muscle relaxant   nitroGLYCERIN (NITROSTAT) 0.4 MG SL tablet Place 1 tablet (0.4 mg total) under the tongue every 5 (five) minutes as needed for chest pain. MAX 2 TAB   pantoprazole (PROTONIX) 40 MG tablet Take 1 tablet (40 mg total) by mouth daily.   polyethylene glycol (MIRALAX / GLYCOLAX) 17 g packet Take 17 g by mouth daily.   polyvinyl alcohol (LIQUIFILM TEARS) 1.4 % ophthalmic solution Place 1 drop into both eyes as needed for  dry eyes.   pravastatin (PRAVACHOL) 10 MG tablet Take 1 tablet (10 mg total) by mouth daily.   propranolol (INDERAL) 10 MG tablet Take 10 mg by mouth 4 (four) times daily as needed.   No facility-administered encounter medications on file as of 01/25/2023.    Past Surgical History:  Procedure Laterality Date   CHOLECYSTECTOMY     COLONOSCOPY     DILATION AND CURETTAGE OF UTERUS     EYE SURGERY Bilateral    total of 5   HIP ARTHROPLASTY Right 03/16/2021   Procedure: POSTERIOR ARTHROPLASTY BIPOLAR HIP (HEMIARTHROPLASTY);  Surgeon: Eldred Manges, MD;  Location: [facility] OR;  Service: Orthopedics;  Laterality: Right;   LASER PHOTO ABLATION Right 10/20/2016   Procedure: LASER PHOTO ABLATION;  Surgeon: Sherrie George, MD;  Location: [facility] OR;  Service: Ophthalmology;  Laterality: Right;  Headscope laser   LEFT ROTATOR CUFF REPAIR X2 Left    PARS PLANA VITRECTOMY  10/20/2016   WITH 25G REMOVAL/SUTURE SECONDARY INTRAOCULAR LENS, GAS FLUID EXCHANGE    PARS PLANA VITRECTOMY Right 10/20/2016   Procedure: PARS PLANA VITRECTOMY WITH 25G REMOVAL/SUTURE SECONDARY INTRAOCULAR LENS, GAS FLUID EXCHANGE;  Surgeon: Sherrie George, MD;  Location: [facility] OR;  Service: Ophthalmology;  Laterality: Right;   RIGHT ROTATOR CUFF REPAIR X1 Right    UPPER GASTROINTESTINAL ENDOSCOPY     with dilation    Family History  Problem Relation Age of Onset   Leukemia Mother    Colon cancer Father 21   Kidney cancer Brother    Bladder Cancer Brother    Stroke Sister    Esophageal cancer Neg Hx    Stomach cancer Neg Hx       Controlled substance contract: n/a     Review of Systems  Constitutional:  Negative for diaphoresis.  Eyes:  Negative for pain.  Respiratory:  Negative for shortness of breath.   Cardiovascular:  Negative for chest pain, palpitations and leg swelling.  Gastrointestinal:  Negative for abdominal pain.  Endocrine: Negative for polydipsia.  Skin:  Negative for rash.  Neurological:  Negative for  dizziness, weakness and headaches.  Hematological:  Does not bruise/bleed easily.  All other systems reviewed and are negative.      Objective:   Physical Exam Vitals and nursing note reviewed.  Constitutional:      General: She is not in acute distress.    Appearance: Normal appearance. She is well-developed.  HENT:     Head: Normocephalic.     Right Ear: Tympanic membrane normal.     Left Ear: Tympanic membrane normal.     Nose: Nose normal.     Mouth/Throat:     Mouth: Mucous membranes are moist.  Eyes:     Pupils: Pupils are equal, round, and reactive to light.  Neck:  Vascular: No carotid bruit or JVD.  Cardiovascular:     Rate and Rhythm: Normal rate. Rhythm irregular.     Heart sounds: Murmur (2/6) heard.  Pulmonary:     Effort: Pulmonary effort is normal. No respiratory distress.     Breath sounds: Normal breath sounds. No wheezing or rales.  Chest:     Chest wall: No tenderness.  Abdominal:     General: Bowel sounds are normal. There is no distension or abdominal bruit.     Palpations: Abdomen is soft. There is no hepatomegaly, splenomegaly, mass or pulsatile mass.     Tenderness: There is no abdominal tenderness.  Musculoskeletal:        General: Normal range of motion.     Cervical back: Normal range of motion and neck supple.  Lymphadenopathy:     Cervical: No cervical adenopathy.  Skin:    General: Skin is warm and dry.  Neurological:     Mental Status: She is alert and oriented to person, place, and time.     Deep Tendon Reflexes: Reflexes are normal and symmetric.  Psychiatric:        Behavior: Behavior normal.        Thought Content: Thought content normal.        Judgment: Judgment normal.     BP (!) 162/78   Pulse 78   Temp (!) 97.4 F (36.3 C) (Temporal)   Resp 20   Ht 5' (1.524 m)   Wt 95 lb (43.1 kg)   BMI 18.55 kg/m        Assessment & Plan:   DANAYSHA KIRN comes in today with chief complaint of Medical Management of Chronic  Issues   Diagnosis and orders addressed:  1. Essential hypertension Low sodium diet - CBC with Differential/Platelet - CMP14+EGFR - amLODipine (NORVASC) 5 MG tablet; Take 1 tablet (5 mg total) by mouth daily. For BP  Dispense: 30 tablet; Refill: 11 - losartan (COZAAR) 50 MG tablet; TAKE ONE TABLET DAILY FOR BLOOD PRESSURE  Dispense: 90 tablet; Refill: 1  2. Benign hypertensive heart disease without heart failure - carvedilol (COREG) 3.125 MG tablet; TAKE  (1)  TABLET TWICE A DAY WITH MEALS (BREAKFAST AND SUPPER)  Dispense: 180 tablet; Refill: 1  3. Congestive heart failure, unspecified HF chronicity, unspecified heart failure type (HCC) - apixaban (ELIQUIS) 2.5 MG TABS tablet; TAKE  (1)  TABLET TWICE A DAY.  Dispense: 180 tablet; Refill: 1  4. Nonrheumatic aortic valve insufficiency  5. Paroxysmal atrial fibrillation (HCC) Keep yearly follow up with cardiology - propranolol (INDERAL) 10 MG tablet; Take 1 tablet (10 mg total) by mouth 4 (four) times daily as needed.  Dispense: 90 tablet; Refill: 1  6. Asymptomatic bilateral carotid artery stenosis No need to repeat doppler study  7. Hyperthyroidism Labs pending - Thyroid Panel With TSH - methimazole (TAPAZOLE) 5 MG tablet; Take 0.5 tablets (2.5 mg total) by mouth daily.  Dispense: 45 tablet; Refill: 3  8. Mixed hyperlipidemia Low fat diet - Lipid panel - pravastatin (PRAVACHOL) 10 MG tablet; Take 1 tablet (10 mg total) by mouth daily.  Dispense: 90 tablet; Refill: 1  9. Diverticulosis of colon Continue to watch diet to prevent flare up  10. Iron deficiency anemia, unspecified iron deficiency anemia type Labs pending  11. Vitamin B12 deficiency Labs oending - Vitamin B12  12. Stricture esophagus - pantoprazole (PROTONIX) 40 MG tablet; Take 1 tablet (40 mg total) by mouth daily.  Dispense: 90 tablet; Refill: 1  13. Vitamin D deficiency - Cholecalciferol (VITAMIN D3) 1.25 MG (50000 UT) TABS; Take 1 capsule by mouth  every 7 (seven) days. Monday  Dispense: 12 tablet; Refill: 1  14. Depression, recurrent (HCC) Stress management - citalopram (CELEXA) 20 MG tablet; Take 1 tablet (20 mg total) by mouth daily.  Dispense: 30 tablet; Refill: 5  Orders Placed This Encounter  Procedures   CBC with Differential/Platelet   CMP14+EGFR   Lipid panel   Thyroid Panel With TSH   Vitamin B12   VITAMIN D 25 Hydroxy (Vit-D Deficiency, Fractures)     Labs pending Health Maintenance reviewed Diet and exercise encouraged  Follow up plan: 6 months   [name], FNP

## 2023-01-26 ENCOUNTER — Telehealth: Payer: Self-pay | Admitting: Nurse Practitioner

## 2023-01-26 LAB — CBC WITH DIFFERENTIAL/PLATELET
Basophils Absolute: 0 10*3/uL (ref 0.0–0.2)
Basos: 0 %
EOS (ABSOLUTE): 0.1 10*3/uL (ref 0.0–0.4)
Eos: 1 %
Hematocrit: 42.2 % (ref 34.0–46.6)
Hemoglobin: 14.2 g/dL (ref 11.1–15.9)
Immature Grans (Abs): 0 10*3/uL (ref 0.0–0.1)
Immature Granulocytes: 0 %
Lymphocytes Absolute: 1.6 10*3/uL (ref 0.7–3.1)
Lymphs: 21 %
MCH: 33.4 pg — ABNORMAL HIGH (ref 26.6–33.0)
MCHC: 33.6 g/dL (ref 31.5–35.7)
MCV: 99 fL — ABNORMAL HIGH (ref 79–97)
Monocytes Absolute: 0.4 10*3/uL (ref 0.1–0.9)
Monocytes: 6 %
Neutrophils Absolute: 5.3 10*3/uL (ref 1.4–7.0)
Neutrophils: 72 %
Platelets: 302 10*3/uL (ref 150–450)
RBC: 4.25 x10E6/uL (ref 3.77–5.28)
RDW: 12 % (ref 11.7–15.4)
WBC: 7.4 10*3/uL (ref 3.4–10.8)

## 2023-01-26 LAB — LIPID PANEL
Chol/HDL Ratio: 3 ratio (ref 0.0–4.4)
Cholesterol, Total: 200 mg/dL — ABNORMAL HIGH (ref 100–199)
HDL: 66 mg/dL (ref >39)
LDL Chol Calc (NIH): 110 mg/dL — ABNORMAL HIGH (ref 0–99)
Triglycerides: 138 mg/dL (ref 0–149)
VLDL Cholesterol Cal: 24 mg/dL (ref 5–40)

## 2023-01-26 LAB — THYROID PANEL WITH TSH
Free Thyroxine Index: 2 (ref 1.2–4.9)
T3 Uptake Ratio: 27 % (ref 24–39)
T4, Total: 7.5 ug/dL (ref 4.5–12.0)
TSH: 2.64 u[IU]/mL (ref 0.450–4.500)

## 2023-01-26 LAB — CMP14+EGFR
ALT: 13 IU/L (ref 0–32)
AST: 20 IU/L (ref 0–40)
Albumin/Globulin Ratio: 1.7 (ref 1.2–2.2)
Albumin: 4.3 g/dL (ref 3.6–4.6)
Alkaline Phosphatase: 91 IU/L (ref 44–121)
BUN/Creatinine Ratio: 20 (ref 12–28)
BUN: 21 mg/dL (ref 10–36)
Bilirubin Total: 0.5 mg/dL (ref 0.0–1.2)
CO2: 23 mmol/L (ref 20–29)
Calcium: 10.3 mg/dL (ref 8.7–10.3)
Chloride: 101 mmol/L (ref 96–106)
Creatinine, Ser: 1.07 mg/dL — ABNORMAL HIGH (ref 0.57–1.00)
Globulin, Total: 2.6 g/dL (ref 1.5–4.5)
Glucose: 86 mg/dL (ref 70–99)
Potassium: 4.7 mmol/L (ref 3.5–5.2)
Sodium: 140 mmol/L (ref 134–144)
Total Protein: 6.9 g/dL (ref 6.0–8.5)
eGFR: 48 mL/min/{1.73_m2} — ABNORMAL LOW (ref >59)

## 2023-01-26 LAB — VITAMIN D 25 HYDROXY (VIT D DEFICIENCY, FRACTURES): Vit D, 25-Hydroxy: 50.7 ng/mL (ref 30.0–100.0)

## 2023-01-26 LAB — VITAMIN B12: Vitamin B-12: 416 pg/mL (ref 232–1245)

## 2023-01-26 NOTE — Telephone Encounter (Signed)
Left detailed message on daughters voicemail with instructions.  

## 2023-01-26 NOTE — Telephone Encounter (Signed)
Pts daughter called wanting to know if it is ok to take the Methocarbamol Rx while taking her anxiety medication. Says she is worried because pt is a fall risk.

## 2023-01-26 NOTE — Telephone Encounter (Signed)
No you should not take both at same time.

## 2023-03-18 ENCOUNTER — Encounter: Payer: Self-pay | Admitting: Family

## 2023-03-18 ENCOUNTER — Telehealth (INDEPENDENT_AMBULATORY_CARE_PROVIDER_SITE_OTHER): Payer: Medicare PPO | Admitting: Family

## 2023-03-18 DIAGNOSIS — N390 Urinary tract infection, site not specified: Secondary | ICD-10-CM | POA: Diagnosis not present

## 2023-03-18 DIAGNOSIS — R399 Unspecified symptoms and signs involving the genitourinary system: Secondary | ICD-10-CM

## 2023-03-18 LAB — URINALYSIS, COMPLETE
Bilirubin, UA: NEGATIVE
Glucose, UA: NEGATIVE
Ketones, UA: NEGATIVE
Nitrite, UA: NEGATIVE
RBC, UA: NEGATIVE
Specific Gravity, UA: 1.01 (ref 1.005–1.030)
Urobilinogen, Ur: 0.2 mg/dL (ref 0.2–1.0)
pH, UA: 6 (ref 5.0–7.5)

## 2023-03-18 LAB — MICROSCOPIC EXAMINATION
RBC, Urine: NONE SEEN /hpf (ref 0–2)
Renal Epithel, UA: NONE SEEN /hpf

## 2023-03-18 MED ORDER — CEPHALEXIN 500 MG PO CAPS
500.0000 mg | ORAL_CAPSULE | Freq: Two times a day (BID) | ORAL | 0 refills | Status: DC
Start: 2023-03-18 — End: 2023-03-22

## 2023-03-18 NOTE — Progress Notes (Signed)
Virtual Visit Consent   Valerie [name], you are scheduled for a virtual visit with a [facility] provider today. Just as with appointments in the office, your consent must be obtained to participate. Your consent will be active for this visit and any virtual visit you may have with one of our providers in the next 365 days. If you have a MyChart account, a copy of this consent can be sent to you electronically.  As this is a virtual visit, video technology does not allow for your provider to perform a traditional examination. This may limit your provider's ability to fully assess your condition. If your provider identifies any concerns that need to be evaluated in person or the need to arrange testing (such as labs, EKG, etc.), we will make arrangements to do so. Although advances in technology are sophisticated, we cannot ensure that it will always work on either your end or our end. If the connection with a video visit is poor, the visit may have to be switched to a telephone visit. With either a video or telephone visit, we are not always able to ensure that we have a secure connection.  By engaging in this virtual visit, you consent to the provision of healthcare and authorize for your insurance to be billed (if applicable) for the services provided during this visit. Depending on your insurance coverage, you may receive a charge related to this service.  I need to obtain your verbal consent now. Are you willing to proceed with your visit today? Valerie [name] has provided verbal consent on 03/18/2023 for a virtual visit (video or telephone). Jannifer Rodney, FNP  Date: 03/18/2023 12:19 PM  Virtual Visit via Video Note   I, Jannifer Rodney, connected with  Valerie [name]  ([id number], 08-02-1928) on 03/18/23 at  4:30 PM EDT by a video-enabled telemedicine application and verified that I am speaking with the correct person using two identifiers.  Location: Patient: Virtual Visit Location Patient:  Home Provider: Virtual Visit Location Provider: Office/Clinic   I discussed the limitations of evaluation and management by telemedicine and the availability of in person appointments. The patient expressed understanding and agreed to proceed.    History of Present Illness: Valerie [name] is a 89 y.o. who identifies as a female who was assigned female at birth, and is being seen today for uti symptoms.  HPI: Dysuria  This is a new problem. The current episode started in the past 7 days. The problem occurs intermittently. The problem has been gradually worsening. The quality of the pain is described as burning. The pain is mild. Associated symptoms include flank pain, frequency and urgency. Pertinent negatives include no nausea or vomiting. She has tried increased fluids for the symptoms. The treatment provided mild relief.    Problems:  Patient Active Problem List   Diagnosis Date Noted   Vitamin D deficiency 01/25/2023   Depression, recurrent (HCC) 01/25/2023   Syncope and collapse 08/30/2022   Closed Rt rib fracture -3rd thru 7th 08/30/2022   Compression fracture of T11 vertebra (HCC) 07/21/2022   Malnutrition of moderate degree 03/18/2021   Essential hypertension 03/16/2021   Femoral neck fracture (HCC) 03/15/2021   Hip fracture (HCC) 03/15/2021   Moderate to severe aortic insufficiency 12/05/2019   Paroxysmal Atrial fibrillation (HCC) 03/06/2019   Congestive heart failure (CHF) (HCC) 02/17/2019   Left thyroid nodule 01/24/2019   Hyperthyroidism 01/14/2019   Bradycardia 04/06/2018   PVC (premature ventricular contraction) 11/26/2016   Hyperlipidemia 11/26/2016  Dislocated IOL (intraocular lens), posterior, right 10/20/2016   Posterior dislocation of iol (intraocular lens), right eye 10/20/2016   History of palpitations 06/30/2016   Blurry vision, bilateral    Glaucoma 05/25/2016   Thyroid nodule 05/20/2015   Carotid stenosis, non-symptomatic 09/14/2012   Internal and external  hemorrhoids without complication 10/05/2011   Vitamin B12 deficiency 09/15/2011   Benign hypertensive heart disease without heart failure 05/22/2011   Stricture esophagus 05/18/2011   Stricture of duodenum 05/18/2011   Iron deficiency anemia 05/15/2011   Diverticulosis of colon 02/03/2008    Allergies:  Allergies  Allergen Reactions   Aricept [Donepezil Hcl] Other (See Comments)    Nightmares, near syncope, weak, decreased appetite.   Lisinopril Other (See Comments)    Hair loss   Sulfa Antibiotics Nausea Only   Sulfasalazine Nausea Only   Medications:  Current Outpatient Medications:    cephALEXin (KEFLEX) 500 MG capsule, Take 1 capsule (500 mg total) by mouth 2 (two) times daily., Disp: 14 capsule, Rfl: 0   acetaminophen (TYLENOL) 325 MG tablet, Take 2 tablets (650 mg total) by mouth every 6 (six) hours as needed for headache or mild pain (Rib cage pain)., Disp: 100 tablet, Rfl: 0   amLODipine (NORVASC) 5 MG tablet, Take 1 tablet (5 mg total) by mouth daily. For BP, Disp: 30 tablet, Rfl: 11   apixaban (ELIQUIS) 2.5 MG TABS tablet, TAKE  (1)  TABLET TWICE A DAY., Disp: 180 tablet, Rfl: 1   carvedilol (COREG) 3.125 MG tablet, TAKE  (1)  TABLET TWICE A DAY WITH MEALS (BREAKFAST AND SUPPER), Disp: 180 tablet, Rfl: 1   Cholecalciferol (VITAMIN D3) 1.25 MG (50000 UT) TABS, Take 1 capsule by mouth every 7 (seven) days. Monday, Disp: 12 tablet, Rfl: 1   citalopram (CELEXA) 20 MG tablet, Take 1 tablet (20 mg total) by mouth daily., Disp: 30 tablet, Rfl: 5   fluticasone (FLONASE) 50 MCG/ACT nasal spray, PLACE 2 SPRAYS INTO BOTH NOSTRILS AS NEEDED FOR ALLERGIES OR RHINITIS, Disp: 16 g, Rfl: 5   lidocaine (LIDODERM) 5 %, Place 1 patch onto the skin daily. Apply to Rib  pain area daily ----Remove & Discard patch within 12 hours or as directed by MD, Disp: 30 patch, Rfl: 0   losartan (COZAAR) 50 MG tablet, TAKE ONE TABLET DAILY FOR BLOOD PRESSURE, Disp: 90 tablet, Rfl: 1   methimazole (TAPAZOLE) 5  MG tablet, Take 0.5 tablets (2.5 mg total) by mouth daily., Disp: 45 tablet, Rfl: 3   methocarbamol (ROBAXIN) 500 MG tablet, Take 1 tablet (500 mg total) by mouth 2 (two) times daily. Muscle relaxant, Disp: 60 tablet, Rfl: 1   nitroGLYCERIN (NITROSTAT) 0.4 MG SL tablet, Place 1 tablet (0.4 mg total) under the tongue every 5 (five) minutes as needed for chest pain. MAX 2 TAB, Disp: 20 tablet, Rfl: 0   pantoprazole (PROTONIX) 40 MG tablet, Take 1 tablet (40 mg total) by mouth daily., Disp: 90 tablet, Rfl: 1   polyethylene glycol (MIRALAX / GLYCOLAX) 17 g packet, Take 17 g by mouth daily., Disp: 30 each, Rfl: 3   polyvinyl alcohol (LIQUIFILM TEARS) 1.4 % ophthalmic solution, Place 1 drop into both eyes as needed for dry eyes., Disp: , Rfl:    pravastatin (PRAVACHOL) 10 MG tablet, Take 1 tablet (10 mg total) by mouth daily., Disp: 90 tablet, Rfl: 1   propranolol (INDERAL) 10 MG tablet, Take 1 tablet (10 mg total) by mouth 4 (four) times daily as needed., Disp: 90 tablet, Rfl: 1  Observations/Objective: Patient is well-developed, well-nourished in no acute distress.  Resting comfortably  at home.  Head is normocephalic, atraumatic.  No labored breathing.  Speech is clear and coherent with logical content.  Patient is alert and oriented at baseline.  Weakness and hoarse voice  Assessment and Plan: 1. UTI symptoms - Urinalysis, Complete - Urine Culture - cephALEXin (KEFLEX) 500 MG capsule; Take 1 capsule (500 mg total) by mouth 2 (two) times daily.  Dispense: 14 capsule; Refill: 0  Force fluids AZO over the counter X2 days Follow up if symptoms worsen or do not improve  Culture pending  Follow Up Instructions: I discussed the assessment and treatment plan with the patient. The patient was provided an opportunity to ask questions and all were answered. The patient agreed with the plan and demonstrated an understanding of the instructions.  A copy of instructions were sent to the patient via  MyChart unless otherwise noted below.    The patient was advised to call back or seek an in-person evaluation if the symptoms worsen or if the condition fails to improve as anticipated.  Time:  I spent 11 minutes with the patient via telehealth technology discussing the above problems/concerns.    Jannifer Rodney, FNP

## 2023-03-20 LAB — URINE CULTURE

## 2023-03-21 LAB — URINE CULTURE

## 2023-03-22 ENCOUNTER — Other Ambulatory Visit: Payer: Self-pay | Admitting: Family

## 2023-03-22 MED ORDER — CIPROFLOXACIN HCL 500 MG PO TABS: 500.0000 mg | ORAL_TABLET | Freq: Two times a day (BID) | ORAL | 0 refills | Status: DC

## 2023-03-29 ENCOUNTER — Telehealth (INDEPENDENT_AMBULATORY_CARE_PROVIDER_SITE_OTHER): Payer: Medicare PPO | Admitting: Family Medicine

## 2023-03-29 NOTE — Progress Notes (Unsigned)
Virtual Visit via MyChart video note  I connected with Valerie [name] on 03/29/23 at *** by video and verified that I am speaking with the correct person using two identifiers. Valerie [name] is currently located at *** and {family members:[phone]} are currently with her during visit. The provider, Elige Radon Vernida Mcnicholas, MD is located in their office at time of visit.  Call ended at ***  I discussed the limitations, risks, security and privacy concerns of performing an evaluation and management service by video and the availability of in person appointments. I also discussed with the patient that there may be a patient responsible charge related to this service. The patient expressed understanding and agreed to proceed.   History and Present Illness:   1. UTI symptoms     Outpatient Encounter Medications as of 03/29/2023  Medication Sig   ciprofloxacin (CIPRO) 500 MG tablet Take 1 tablet (500 mg total) by mouth 2 (two) times daily.   acetaminophen (TYLENOL) 325 MG tablet Take 2 tablets (650 mg total) by mouth every 6 (six) hours as needed for headache or mild pain (Rib cage pain).   amLODipine (NORVASC) 5 MG tablet Take 1 tablet (5 mg total) by mouth daily. For BP   apixaban (ELIQUIS) 2.5 MG TABS tablet TAKE  (1)  TABLET TWICE A DAY.   carvedilol (COREG) 3.125 MG tablet TAKE  (1)  TABLET TWICE A DAY WITH MEALS (BREAKFAST AND SUPPER)   Cholecalciferol (VITAMIN D3) 1.25 MG (50000 UT) TABS Take 1 capsule by mouth every 7 (seven) days. Monday   citalopram (CELEXA) 20 MG tablet Take 1 tablet (20 mg total) by mouth daily.   fluticasone (FLONASE) 50 MCG/ACT nasal spray PLACE 2 SPRAYS INTO BOTH NOSTRILS AS NEEDED FOR ALLERGIES OR RHINITIS   lidocaine (LIDODERM) 5 % Place 1 patch onto the skin daily. Apply to Rib  pain area daily ----Remove & Discard patch within 12 hours or as directed by MD   losartan (COZAAR) 50 MG tablet TAKE ONE TABLET DAILY FOR BLOOD PRESSURE   methimazole (TAPAZOLE) 5 MG tablet  Take 0.5 tablets (2.5 mg total) by mouth daily.   methocarbamol (ROBAXIN) 500 MG tablet Take 1 tablet (500 mg total) by mouth 2 (two) times daily. Muscle relaxant   nitroGLYCERIN (NITROSTAT) 0.4 MG SL tablet Place 1 tablet (0.4 mg total) under the tongue every 5 (five) minutes as needed for chest pain. MAX 2 TAB   pantoprazole (PROTONIX) 40 MG tablet Take 1 tablet (40 mg total) by mouth daily.   polyethylene glycol (MIRALAX / GLYCOLAX) 17 g packet Take 17 g by mouth daily.   polyvinyl alcohol (LIQUIFILM TEARS) 1.4 % ophthalmic solution Place 1 drop into both eyes as needed for dry eyes.   pravastatin (PRAVACHOL) 10 MG tablet Take 1 tablet (10 mg total) by mouth daily.   propranolol (INDERAL) 10 MG tablet Take 1 tablet (10 mg total) by mouth 4 (four) times daily as needed.   No facility-administered encounter medications on file as of 03/29/2023.    Review of Systems  Observations/Objective:   Assessment and Plan: Problem List Items Addressed This Visit   None Visit Diagnoses     UTI symptoms    -  Primary   Relevant Orders   Urine Culture   Urinalysis, Complete        Follow up plan: No follow-ups on file.     I discussed the assessment and treatment plan with the patient. The patient was provided an opportunity  to ask questions and all were answered. The patient agreed with the plan and demonstrated an understanding of the instructions.   The patient was advised to call back or seek an in-person evaluation if the symptoms worsen or if the condition fails to improve as anticipated.  The above assessment and management plan was discussed with the patient. The patient verbalized understanding of and has agreed to the management plan. Patient is aware to call the clinic if symptoms persist or worsen. Patient is aware when to return to the clinic for a follow-up visit. Patient educated on when it is appropriate to go to the emergency department.    I provided *** minutes of  non-face-to-face time during this encounter.    Nils Pyle, MD

## 2023-04-08 ENCOUNTER — Telehealth (INDEPENDENT_AMBULATORY_CARE_PROVIDER_SITE_OTHER): Payer: Medicare PPO | Admitting: Nurse Practitioner

## 2023-04-08 ENCOUNTER — Encounter: Payer: Self-pay | Admitting: Nurse Practitioner

## 2023-04-08 DIAGNOSIS — R300 Dysuria: Secondary | ICD-10-CM

## 2023-04-08 NOTE — Patient Instructions (Signed)
Sadie Haber, thank you for joining Bennie Pierini, FNP for today's virtual visit.  While this provider is not your primary care provider (PCP), if your PCP is located in our provider database this encounter information will be shared with them immediately following your visit.   A [facility] MyChart account gives you access to today's visit and all your visits, tests, and labs performed at [facility] " click here if you don't have a [facility] MyChart account or go to [email or web address]  Consent: (Patient) Sadie Haber provided verbal consent for this virtual visit at the beginning of the encounter.  Current Medications:  Current Outpatient Medications:    ciprofloxacin (CIPRO) 500 MG tablet, Take 1 tablet (500 mg total) by mouth 2 (two) times daily., Disp: 10 tablet, Rfl: 0   acetaminophen (TYLENOL) 325 MG tablet, Take 2 tablets (650 mg total) by mouth every 6 (six) hours as needed for headache or mild pain (Rib cage pain)., Disp: 100 tablet, Rfl: 0   amLODipine (NORVASC) 5 MG tablet, Take 1 tablet (5 mg total) by mouth daily. For BP, Disp: 30 tablet, Rfl: 11   apixaban (ELIQUIS) 2.5 MG TABS tablet, TAKE  (1)  TABLET TWICE A DAY., Disp: 180 tablet, Rfl: 1   carvedilol (COREG) 3.125 MG tablet, TAKE  (1)  TABLET TWICE A DAY WITH MEALS (BREAKFAST AND SUPPER), Disp: 180 tablet, Rfl: 1   Cholecalciferol (VITAMIN D3) 1.25 MG (50000 UT) TABS, Take 1 capsule by mouth every 7 (seven) days. Monday, Disp: 12 tablet, Rfl: 1   citalopram (CELEXA) 20 MG tablet, Take 1 tablet (20 mg total) by mouth daily., Disp: 30 tablet, Rfl: 5   fluticasone (FLONASE) 50 MCG/ACT nasal spray, PLACE 2 SPRAYS INTO BOTH NOSTRILS AS NEEDED FOR ALLERGIES OR RHINITIS, Disp: 16 g, Rfl: 5   lidocaine (LIDODERM) 5 %, Place 1 patch onto the skin daily. Apply to Rib  pain area daily ----Remove & Discard patch within 12 hours or as directed by MD, Disp: 30 patch, Rfl: 0   losartan (COZAAR) 50 MG tablet, TAKE  ONE TABLET DAILY FOR BLOOD PRESSURE, Disp: 90 tablet, Rfl: 1   methimazole (TAPAZOLE) 5 MG tablet, Take 0.5 tablets (2.5 mg total) by mouth daily., Disp: 45 tablet, Rfl: 3   methocarbamol (ROBAXIN) 500 MG tablet, Take 1 tablet (500 mg total) by mouth 2 (two) times daily. Muscle relaxant, Disp: 60 tablet, Rfl: 1   nitroGLYCERIN (NITROSTAT) 0.4 MG SL tablet, Place 1 tablet (0.4 mg total) under the tongue every 5 (five) minutes as needed for chest pain. MAX 2 TAB, Disp: 20 tablet, Rfl: 0   pantoprazole (PROTONIX) 40 MG tablet, Take 1 tablet (40 mg total) by mouth daily., Disp: 90 tablet, Rfl: 1   polyethylene glycol (MIRALAX / GLYCOLAX) 17 g packet, Take 17 g by mouth daily., Disp: 30 each, Rfl: 3   polyvinyl alcohol (LIQUIFILM TEARS) 1.4 % ophthalmic solution, Place 1 drop into both eyes as needed for dry eyes., Disp: , Rfl:    pravastatin (PRAVACHOL) 10 MG tablet, Take 1 tablet (10 mg total) by mouth daily., Disp: 90 tablet, Rfl: 1   propranolol (INDERAL) 10 MG tablet, Take 1 tablet (10 mg total) by mouth 4 (four) times daily as needed., Disp: 90 tablet, Rfl: 1   Medications ordered in this encounter:  No orders of the defined types were placed in this encounter.    *If you need refills on other medications prior to your next appointment, please contact your  pharmacy*  Follow-Up: Call back or seek an in-person evaluation if the symptoms worsen or if the condition fails to improve as anticipated.  [facility] [phone]  Other Instructions [facility] OTC until can seen urine results that will be brought in tomorrow.   If you have been instructed to have an in-person evaluation today at a local Urgent Care facility, please use the link below. It will take you to a list of all of our available [facility], including address, phone number and hours of operation. Please do not delay care.  [facility]  If you or a family member do not have a primary care  provider, use the link below to schedule a visit and establish care. When you choose a [facility] primary care physician or advanced practice provider, you gain a long-term partner in health. Find a Primary Care Provider  Learn more about [facility]'s in-office and virtual care options: [facility] - Get Care Now

## 2023-04-08 NOTE — Progress Notes (Addendum)
Virtual Visit Consent   Valerie [name], you are scheduled for a virtual visit with [name], FNP, a [facility] provider, today.     Just as with appointments in the office, your consent must be obtained to participate.  Your consent will be active for this visit and any virtual visit you may have with one of our providers in the next 365 days.     If you have a MyChart account, a copy of this consent can be sent to you electronically.  All virtual visits are billed to your insurance company just like a traditional visit in the office.    As this is a virtual visit, video technology does not allow for your provider to perform a traditional examination.  This may limit your provider's ability to fully assess your condition.  If your provider identifies any concerns that need to be evaluated in person or the need to arrange testing (such as labs, EKG, etc.), we will make arrangements to do so.     Although advances in technology are sophisticated, we cannot ensure that it will always work on either your end or our end.  If the connection with a video visit is poor, the visit may have to be switched to a telephone visit.  With either a video or telephone visit, we are not always able to ensure that we have a secure connection.     I need to obtain your verbal consent now.   Are you willing to proceed with your visit today? YES   Valerie [name] has provided verbal consent on 04/08/2023 for a virtual visit (video or telephone).   [name], FNP   Date: 04/08/2023 3:10 PM   Virtual Visit via Video Note   I, [name], connected with Valerie [name] ([id number], 1928/09/07) on 04/08/23 at  4:45 PM EDT by a video-enabled telemedicine application and verified that I am speaking with the correct person using two identifiers.  Location: Patient: Virtual Visit Location Patient: Home Provider: Virtual Visit Location Provider: Mobile   I discussed the limitations of  evaluation and management by telemedicine and the availability of in person appointments. The patient expressed understanding and agreed to proceed.    History of Present Illness: Valerie [name] is a 89 y.o. who identifies as a female who was assigned female at birth, and is being seen today for unresolved UTI.  HPI: Patient had a uti several weeks ago. She was treated with keflex. Grew e coli and keflex ws resistance. Was sent prescription of cipro. Says he is no better. Has urinary frequency and urgency.     Review of Systems  Genitourinary:  Positive for frequency and urgency. Negative for flank pain and hematuria.    Problems:  Patient Active Problem List   Diagnosis Date Noted   Vitamin D deficiency 01/25/2023   Depression, recurrent (HCC) 01/25/2023   Syncope and collapse 08/30/2022   Closed Rt rib fracture -3rd thru 7th 08/30/2022   Compression fracture of T11 vertebra (HCC) 07/21/2022   Malnutrition of moderate degree 03/18/2021   Essential hypertension 03/16/2021   Femoral neck fracture (HCC) 03/15/2021   Hip fracture (HCC) 03/15/2021   Moderate to severe aortic insufficiency 12/05/2019   Paroxysmal Atrial fibrillation (HCC) 03/06/2019   Congestive heart failure (CHF) (HCC) 02/17/2019   Left thyroid nodule 01/24/2019   Hyperthyroidism 01/14/2019   Bradycardia 04/06/2018   PVC (premature ventricular contraction) 11/26/2016   Hyperlipidemia 11/26/2016   Dislocated IOL (intraocular lens),  posterior, right 10/20/2016   Posterior dislocation of iol (intraocular lens), right eye 10/20/2016   History of palpitations 06/30/2016   Blurry vision, bilateral    Glaucoma 05/25/2016   Thyroid nodule 05/20/2015   Carotid stenosis, non-symptomatic 09/14/2012   Internal and external hemorrhoids without complication 10/05/2011   Vitamin B12 deficiency 09/15/2011   Benign hypertensive heart disease without heart failure 05/22/2011   Stricture esophagus 05/18/2011   Stricture of  duodenum 05/18/2011   Iron deficiency anemia 05/15/2011   Diverticulosis of colon 02/03/2008    Allergies:  Allergies  Allergen Reactions   Aricept [Donepezil Hcl] Other (See Comments)    Nightmares, near syncope, weak, decreased appetite.   Lisinopril Other (See Comments)    Hair loss   Sulfa Antibiotics Nausea Only   Sulfasalazine Nausea Only   Medications:  Current Outpatient Medications:    ciprofloxacin (CIPRO) 500 MG tablet, Take 1 tablet (500 mg total) by mouth 2 (two) times daily., Disp: 10 tablet, Rfl: 0   acetaminophen (TYLENOL) 325 MG tablet, Take 2 tablets (650 mg total) by mouth every 6 (six) hours as needed for headache or mild pain (Rib cage pain)., Disp: 100 tablet, Rfl: 0   amLODipine (NORVASC) 5 MG tablet, Take 1 tablet (5 mg total) by mouth daily. For BP, Disp: 30 tablet, Rfl: 11   apixaban (ELIQUIS) 2.5 MG TABS tablet, TAKE  (1)  TABLET TWICE A DAY., Disp: 180 tablet, Rfl: 1   carvedilol (COREG) 3.125 MG tablet, TAKE  (1)  TABLET TWICE A DAY WITH MEALS (BREAKFAST AND SUPPER), Disp: 180 tablet, Rfl: 1   Cholecalciferol (VITAMIN D3) 1.25 MG (50000 UT) TABS, Take 1 capsule by mouth every 7 (seven) days. Monday, Disp: 12 tablet, Rfl: 1   citalopram (CELEXA) 20 MG tablet, Take 1 tablet (20 mg total) by mouth daily., Disp: 30 tablet, Rfl: 5   fluticasone (FLONASE) 50 MCG/ACT nasal spray, PLACE 2 SPRAYS INTO BOTH NOSTRILS AS NEEDED FOR ALLERGIES OR RHINITIS, Disp: 16 g, Rfl: 5   lidocaine (LIDODERM) 5 %, Place 1 patch onto the skin daily. Apply to Rib  pain area daily ----Remove & Discard patch within 12 hours or as directed by MD, Disp: 30 patch, Rfl: 0   losartan (COZAAR) 50 MG tablet, TAKE ONE TABLET DAILY FOR BLOOD PRESSURE, Disp: 90 tablet, Rfl: 1   methimazole (TAPAZOLE) 5 MG tablet, Take 0.5 tablets (2.5 mg total) by mouth daily., Disp: 45 tablet, Rfl: 3   methocarbamol (ROBAXIN) 500 MG tablet, Take 1 tablet (500 mg total) by mouth 2 (two) times daily. Muscle relaxant,  Disp: 60 tablet, Rfl: 1   nitroGLYCERIN (NITROSTAT) 0.4 MG SL tablet, Place 1 tablet (0.4 mg total) under the tongue every 5 (five) minutes as needed for chest pain. MAX 2 TAB, Disp: 20 tablet, Rfl: 0   pantoprazole (PROTONIX) 40 MG tablet, Take 1 tablet (40 mg total) by mouth daily., Disp: 90 tablet, Rfl: 1   polyethylene glycol (MIRALAX / GLYCOLAX) 17 g packet, Take 17 g by mouth daily., Disp: 30 each, Rfl: 3   polyvinyl alcohol (LIQUIFILM TEARS) 1.4 % ophthalmic solution, Place 1 drop into both eyes as needed for dry eyes., Disp: , Rfl:    pravastatin (PRAVACHOL) 10 MG tablet, Take 1 tablet (10 mg total) by mouth daily., Disp: 90 tablet, Rfl: 1   propranolol (INDERAL) 10 MG tablet, Take 1 tablet (10 mg total) by mouth 4 (four) times daily as needed., Disp: 90 tablet, Rfl: 1  Observations/Objective: Patient is  well-developed, well-nourished in no acute distress.  Resting comfortably  at home.  Head is normocephalic, atraumatic.  No labored breathing.  Speech is clear and coherent with logical content.  Patient is alert and oriented at baseline.    Assessment and Plan:  Valerie [name] in today with chief complaint of No chief complaint on file.   1. Dysuria Azo OTC Will bring urine in tomorrow to be checked - Urinalysis, Complete; Future   Follow Up Instructions: I discussed the assessment and treatment plan with the patient. The patient was provided an opportunity to ask questions and all were answered. The patient agreed with the plan and demonstrated an understanding of the instructions.  A copy of instructions were sent to the patient via MyChart.  The patient was advised to call back or seek an in-person evaluation if the symptoms worsen or if the condition fails to improve as anticipated.  Time:  I spent 6 minutes with the patient via telehealth technology discussing the above problems/concerns.    [name], FNP   Addendum_ urine brought in on 04/09/23- Positive  for nitrites- Meds ordered this encounter  Medications   doxycycline (VIBRA-TABS) 100 MG tablet    Sig: Take 1 tablet (100 mg total) by mouth 2 (two) times daily. 1 po bid    Dispense:  20 tablet    Refill:  0    Order Specific Question:   Supervising Provider    Answer:   Nils Pyle [[id number]]   [name], FNP

## 2023-04-09 ENCOUNTER — Telehealth: Payer: Self-pay

## 2023-04-09 ENCOUNTER — Other Ambulatory Visit: Payer: Medicare PPO

## 2023-04-09 DIAGNOSIS — R300 Dysuria: Secondary | ICD-10-CM | POA: Diagnosis not present

## 2023-04-09 LAB — MICROSCOPIC EXAMINATION
Renal Epithel, UA: NONE SEEN /hpf
WBC, UA: NONE SEEN /hpf (ref 0–5)

## 2023-04-09 LAB — URINALYSIS, COMPLETE
Bilirubin, UA: NEGATIVE
Ketones, UA: NEGATIVE
Leukocytes,UA: NEGATIVE
Nitrite, UA: POSITIVE — AB
Protein,UA: NEGATIVE
RBC, UA: NEGATIVE
Specific Gravity, UA: 1.015 (ref 1.005–1.030)
Urobilinogen, Ur: 0.2 mg/dL (ref 0.2–1.0)
pH, UA: 6.5 (ref 5.0–7.5)

## 2023-04-09 MED ORDER — DOXYCYCLINE HYCLATE 100 MG PO TABS: 100.0000 mg | ORAL_TABLET | Freq: Two times a day (BID) | ORAL | 0 refills | Status: DC

## 2023-04-09 NOTE — Telephone Encounter (Signed)
Received urine results back from lab. MMM reviewed and sent in doxycycline BID for 10 days. Contacted daughter Charlton Amor and advised her that rx was sent in. Daughter verbalized understanding

## 2023-04-09 NOTE — Addendum Note (Signed)
Addended by: Bennie Pierini on: 04/09/2023 10:44 AM   Modules accepted: Orders

## 2023-05-14 ENCOUNTER — Encounter: Payer: Self-pay | Admitting: Nurse Practitioner

## 2023-05-14 ENCOUNTER — Telehealth (INDEPENDENT_AMBULATORY_CARE_PROVIDER_SITE_OTHER): Payer: Medicare PPO | Admitting: Nurse Practitioner

## 2023-05-14 DIAGNOSIS — R300 Dysuria: Secondary | ICD-10-CM

## 2023-05-14 LAB — URINALYSIS, COMPLETE
Bilirubin, UA: NEGATIVE
Glucose, UA: NEGATIVE
Ketones, UA: NEGATIVE
Leukocytes,UA: NEGATIVE
Nitrite, UA: NEGATIVE
Protein,UA: NEGATIVE
Specific Gravity, UA: <1.005 — ABNORMAL LOW (ref 1.005–1.030)
Urobilinogen, Ur: 0.2 mg/dL (ref 0.2–1.0)
pH, UA: 6 (ref 5.0–7.5)

## 2023-05-14 LAB — MICROSCOPIC EXAMINATION
Epithelial Cells (non renal): NONE SEEN /hpf (ref 0–10)
RBC, Urine: NONE SEEN /hpf (ref 0–2)
Renal Epithel, UA: NONE SEEN /hpf
WBC, UA: NONE SEEN /hpf (ref 0–5)

## 2023-05-14 MED ORDER — CIPROFLOXACIN HCL 500 MG PO TABS: 500.0000 mg | ORAL_TABLET | Freq: Two times a day (BID) | ORAL | 0 refills | Status: DC

## 2023-05-14 NOTE — Progress Notes (Signed)
Virtual Visit Consent   [name], you are scheduled for a virtual visit with Valerie Daphine Deutscher, FNP, a [facility] provider, today.     Just as with appointments in the office, your consent must be obtained to participate.  Your consent will be active for this visit and any virtual visit you may have with one of our providers in the next 365 days.     If you have a MyChart account, a copy of this consent can be sent to you electronically.  All virtual visits are billed to your insurance company just like a traditional visit in the office.    As this is a virtual visit, video technology does not allow for your provider to perform a traditional examination.  This may limit your provider's ability to fully assess your condition.  If your provider identifies any concerns that need to be evaluated in person or the need to arrange testing (such as labs, EKG, etc.), we will make arrangements to do so.     Although advances in technology are sophisticated, we cannot ensure that it will always work on either your end or our end.  If the connection with a video visit is poor, the visit may have to be switched to a telephone visit.  With either a video or telephone visit, we are not always able to ensure that we have a secure connection.     I need to obtain your verbal consent now.   Are you willing to proceed with your visit today? YES   LACONDA HERSKOVITZ has provided verbal consent on 05/14/2023 for a virtual visit (video or telephone).   Valerie Daphine Deutscher, FNP   Date: 05/14/2023 3:10 PM   Virtual Visit via Video Note   I, Valerie [name], connected with Valerie [name] ([id number], May 30, 1928) on 05/14/23 at  5:45 PM EDT by a video-enabled telemedicine application and verified that I am speaking with the correct person using two identifiers.  Location: Patient: Virtual Visit Location Patient: Home Provider: Virtual Visit Location Provider: Mobile   I discussed the limitations of  evaluation and management by telemedicine and the availability of in person appointments. The patient expressed understanding and agreed to proceed.    History of Present Illness: Valerie [name] is a 89 y.o. who identifies as a female who was assigned female at birth, and is being seen today for uti .  HPI: Urinary Tract Infection  This is a new problem. The current episode started in the past 7 days. The problem occurs every urination. The problem has been waxing and waning. The quality of the pain is described as burning. The pain is at a severity of 4/10. There has been no fever. She is Not sexually active. There is No history of pyelonephritis. Associated symptoms include frequency, hesitancy and urgency. Pertinent negatives include no flank pain. She has tried nothing for the symptoms. The treatment provided mild relief.    Review of Systems  Genitourinary:  Positive for frequency, hesitancy and urgency. Negative for flank pain.    Problems:  Patient Active Problem List   Diagnosis Date Noted   Vitamin D deficiency 01/25/2023   Depression, recurrent (HCC) 01/25/2023   Syncope and collapse 08/30/2022   Closed Rt rib fracture -3rd thru 7th 08/30/2022   Compression fracture of T11 vertebra (HCC) 07/21/2022   Malnutrition of moderate degree 03/18/2021   Essential hypertension 03/16/2021   Femoral neck fracture (HCC) 03/15/2021   Hip fracture (HCC) 03/15/2021  Moderate to severe aortic insufficiency 12/05/2019   Paroxysmal Atrial fibrillation (HCC) 03/06/2019   Congestive heart failure (CHF) (HCC) 02/17/2019   Left thyroid nodule 01/24/2019   Hyperthyroidism 01/14/2019   Bradycardia 04/06/2018   PVC (premature ventricular contraction) 11/26/2016   Hyperlipidemia 11/26/2016   Dislocated IOL (intraocular lens), posterior, right 10/20/2016   Posterior dislocation of iol (intraocular lens), right eye 10/20/2016   History of palpitations 06/30/2016   Blurry vision, bilateral     Glaucoma 05/25/2016   Thyroid nodule 05/20/2015   Carotid stenosis, non-symptomatic 09/14/2012   Internal and external hemorrhoids without complication 10/05/2011   Vitamin B12 deficiency 09/15/2011   Benign hypertensive heart disease without heart failure 05/22/2011   Stricture esophagus 05/18/2011   Stricture of duodenum 05/18/2011   Iron deficiency anemia 05/15/2011   Diverticulosis of colon 02/03/2008    Allergies:  Allergies  Allergen Reactions   Aricept [Donepezil Hcl] Other (See Comments)    Nightmares, near syncope, weak, decreased appetite.   Lisinopril Other (See Comments)    Hair loss   Sulfa Antibiotics Nausea Only   Sulfasalazine Nausea Only   Medications:  Current Outpatient Medications:    ciprofloxacin (CIPRO) 500 MG tablet, Take 1 tablet (500 mg total) by mouth 2 (two) times daily., Disp: 10 tablet, Rfl: 0   acetaminophen (TYLENOL) 325 MG tablet, Take 2 tablets (650 mg total) by mouth every 6 (six) hours as needed for headache or mild pain (Rib cage pain)., Disp: 100 tablet, Rfl: 0   amLODipine (NORVASC) 5 MG tablet, Take 1 tablet (5 mg total) by mouth daily. For BP, Disp: 30 tablet, Rfl: 11   apixaban (ELIQUIS) 2.5 MG TABS tablet, TAKE  (1)  TABLET TWICE A DAY., Disp: 180 tablet, Rfl: 1   carvedilol (COREG) 3.125 MG tablet, TAKE  (1)  TABLET TWICE A DAY WITH MEALS (BREAKFAST AND SUPPER), Disp: 180 tablet, Rfl: 1   Cholecalciferol (VITAMIN D3) 1.25 MG (50000 UT) TABS, Take 1 capsule by mouth every 7 (seven) days. Monday, Disp: 12 tablet, Rfl: 1   citalopram (CELEXA) 20 MG tablet, Take 1 tablet (20 mg total) by mouth daily., Disp: 30 tablet, Rfl: 5   doxycycline (VIBRA-TABS) 100 MG tablet, Take 1 tablet (100 mg total) by mouth 2 (two) times daily. 1 po bid, Disp: 20 tablet, Rfl: 0   fluticasone (FLONASE) 50 MCG/ACT nasal spray, PLACE 2 SPRAYS INTO BOTH NOSTRILS AS NEEDED FOR ALLERGIES OR RHINITIS, Disp: 16 g, Rfl: 5   lidocaine (LIDODERM) 5 %, Place 1 patch onto the  skin daily. Apply to Rib  pain area daily ----Remove & Discard patch within 12 hours or as directed by MD, Disp: 30 patch, Rfl: 0   losartan (COZAAR) 50 MG tablet, TAKE ONE TABLET DAILY FOR BLOOD PRESSURE, Disp: 90 tablet, Rfl: 1   methimazole (TAPAZOLE) 5 MG tablet, Take 0.5 tablets (2.5 mg total) by mouth daily., Disp: 45 tablet, Rfl: 3   methocarbamol (ROBAXIN) 500 MG tablet, Take 1 tablet (500 mg total) by mouth 2 (two) times daily. Muscle relaxant, Disp: 60 tablet, Rfl: 1   nitroGLYCERIN (NITROSTAT) 0.4 MG SL tablet, Place 1 tablet (0.4 mg total) under the tongue every 5 (five) minutes as needed for chest pain. MAX 2 TAB, Disp: 20 tablet, Rfl: 0   pantoprazole (PROTONIX) 40 MG tablet, Take 1 tablet (40 mg total) by mouth daily., Disp: 90 tablet, Rfl: 1   polyethylene glycol (MIRALAX / GLYCOLAX) 17 g packet, Take 17 g by mouth daily., Disp: 30  each, Rfl: 3   polyvinyl alcohol (LIQUIFILM TEARS) 1.4 % ophthalmic solution, Place 1 drop into both eyes as needed for dry eyes., Disp: , Rfl:    pravastatin (PRAVACHOL) 10 MG tablet, Take 1 tablet (10 mg total) by mouth daily., Disp: 90 tablet, Rfl: 1   propranolol (INDERAL) 10 MG tablet, Take 1 tablet (10 mg total) by mouth 4 (four) times daily as needed., Disp: 90 tablet, Rfl: 1  Observations/Objective: Patient is well-developed, well-nourished in no acute distress.  Resting comfortably  at home.  Head is normocephalic, atraumatic.  No labored breathing.  Speech is clear and coherent with logical content.  Patient is alert and oriented at baseline.  Mild suprapubic pain on palpation.  Assessment and Plan:  KETZIA FRIEDE in today with chief complaint of Urinary Tract Infection   1. Dysuria Take medication as prescribe Cotton underwear Take shower not bath Cranberry juice, yogurt Force fluids AZO over the counter X2 days Culture pending-patient will bring in specimen RTO prn    Follow Up Instructions: I discussed the assessment and  treatment plan with the patient. The patient was provided an opportunity to ask questions and all were answered. The patient agreed with the plan and demonstrated an understanding of the instructions.  A copy of instructions were sent to the patient via MyChart.  The patient was advised to call back or seek an in-person evaluation if the symptoms worsen or if the condition fails to improve as anticipated.  Time:  I spent 9 minutes with the patient via telehealth technology discussing the above problems/concerns.    Valerie Daphine Deutscher, FNP

## 2023-05-14 NOTE — Patient Instructions (Signed)
Valerie [name], thank you for joining Bennie Pierini, FNP for today's virtual visit.  While this provider is not your primary care provider (PCP), if your PCP is located in our provider database this encounter information will be shared with them immediately following your visit.   A [facility] MyChart account gives you access to today's visit and all your visits, tests, and labs performed at [facility] " click here if you don't have a [facility] MyChart account or go to [email or web address]  Consent: (Patient) Valerie [name] provided verbal consent for this virtual visit at the beginning of the encounter.  Current Medications:  Current Outpatient Medications:    acetaminophen (TYLENOL) 325 MG tablet, Take 2 tablets (650 mg total) by mouth every 6 (six) hours as needed for headache or mild pain (Rib cage pain)., Disp: 100 tablet, Rfl: 0   amLODipine (NORVASC) 5 MG tablet, Take 1 tablet (5 mg total) by mouth daily. For BP, Disp: 30 tablet, Rfl: 11   apixaban (ELIQUIS) 2.5 MG TABS tablet, TAKE  (1)  TABLET TWICE A DAY., Disp: 180 tablet, Rfl: 1   carvedilol (COREG) 3.125 MG tablet, TAKE  (1)  TABLET TWICE A DAY WITH MEALS (BREAKFAST AND SUPPER), Disp: 180 tablet, Rfl: 1   Cholecalciferol (VITAMIN D3) 1.25 MG (50000 UT) TABS, Take 1 capsule by mouth every 7 (seven) days. Monday, Disp: 12 tablet, Rfl: 1   ciprofloxacin (CIPRO) 500 MG tablet, Take 1 tablet (500 mg total) by mouth 2 (two) times daily., Disp: 10 tablet, Rfl: 0   citalopram (CELEXA) 20 MG tablet, Take 1 tablet (20 mg total) by mouth daily., Disp: 30 tablet, Rfl: 5   doxycycline (VIBRA-TABS) 100 MG tablet, Take 1 tablet (100 mg total) by mouth 2 (two) times daily. 1 po bid, Disp: 20 tablet, Rfl: 0   fluticasone (FLONASE) 50 MCG/ACT nasal spray, PLACE 2 SPRAYS INTO BOTH NOSTRILS AS NEEDED FOR ALLERGIES OR RHINITIS, Disp: 16 g, Rfl: 5   lidocaine (LIDODERM) 5 %, Place 1 patch onto the skin daily. Apply to Rib  pain  area daily ----Remove & Discard patch within 12 hours or as directed by MD, Disp: 30 patch, Rfl: 0   losartan (COZAAR) 50 MG tablet, TAKE ONE TABLET DAILY FOR BLOOD PRESSURE, Disp: 90 tablet, Rfl: 1   methimazole (TAPAZOLE) 5 MG tablet, Take 0.5 tablets (2.5 mg total) by mouth daily., Disp: 45 tablet, Rfl: 3   methocarbamol (ROBAXIN) 500 MG tablet, Take 1 tablet (500 mg total) by mouth 2 (two) times daily. Muscle relaxant, Disp: 60 tablet, Rfl: 1   nitroGLYCERIN (NITROSTAT) 0.4 MG SL tablet, Place 1 tablet (0.4 mg total) under the tongue every 5 (five) minutes as needed for chest pain. MAX 2 TAB, Disp: 20 tablet, Rfl: 0   pantoprazole (PROTONIX) 40 MG tablet, Take 1 tablet (40 mg total) by mouth daily., Disp: 90 tablet, Rfl: 1   polyethylene glycol (MIRALAX / GLYCOLAX) 17 g packet, Take 17 g by mouth daily., Disp: 30 each, Rfl: 3   polyvinyl alcohol (LIQUIFILM TEARS) 1.4 % ophthalmic solution, Place 1 drop into both eyes as needed for dry eyes., Disp: , Rfl:    pravastatin (PRAVACHOL) 10 MG tablet, Take 1 tablet (10 mg total) by mouth daily., Disp: 90 tablet, Rfl: 1   propranolol (INDERAL) 10 MG tablet, Take 1 tablet (10 mg total) by mouth 4 (four) times daily as needed., Disp: 90 tablet, Rfl: 1   Medications ordered in this encounter:  Meds ordered  this encounter  Medications   ciprofloxacin (CIPRO) 500 MG tablet    Sig: Take 1 tablet (500 mg total) by mouth 2 (two) times daily.    Dispense:  10 tablet    Refill:  0    Order Specific Question:   Supervising Provider    Answer:   Arville Care A [[id number]]     *If you need refills on other medications prior to your next appointment, please contact your pharmacy*  Follow-Up: Call back or seek an in-person evaluation if the symptoms worsen or if the condition fails to improve as anticipated.  [facility] [phone]  Other Instructions Take medication as prescribe Cotton underwear Take shower not bath Cranberry juice,  yogurt Force fluids AZO over the counter X2 days Culture pending RTO prn    If you have been instructed to have an in-person evaluation today at a local Urgent Care facility, please use the link below. It will take you to a list of all of our available [facility], including address, phone number and hours of operation. Please do not delay care.  [facility]  If you or a family member do not have a primary care provider, use the link below to schedule a visit and establish care. When you choose a [facility] primary care physician or advanced practice provider, you gain a long-term partner in health. Find a Primary Care Provider  Learn more about [facility]'s in-office and virtual care options: [facility] - Get Care Now

## 2023-05-18 ENCOUNTER — Other Ambulatory Visit: Payer: Self-pay

## 2023-05-18 ENCOUNTER — Emergency Department (HOSPITAL_COMMUNITY): Payer: Medicare PPO

## 2023-05-18 ENCOUNTER — Emergency Department (HOSPITAL_COMMUNITY)
Admission: EM | Admit: 2023-05-18 | Discharge: 2023-05-18 | Disposition: A | Discharge status: Home / Self Care | Payer: Medicare PPO | Attending: Emergency Medicine | Admitting: Emergency Medicine

## 2023-05-18 DIAGNOSIS — S81812A Laceration without foreign body, left lower leg, initial encounter: Secondary | ICD-10-CM | POA: Diagnosis not present

## 2023-05-18 DIAGNOSIS — W06XXXA Fall from bed, initial encounter: Secondary | ICD-10-CM | POA: Diagnosis not present

## 2023-05-18 DIAGNOSIS — Z7901 Long term (current) use of anticoagulants: Secondary | ICD-10-CM | POA: Insufficient documentation

## 2023-05-18 DIAGNOSIS — M1711 Unilateral primary osteoarthritis, right knee: Secondary | ICD-10-CM | POA: Diagnosis not present

## 2023-05-18 DIAGNOSIS — S81811A Laceration without foreign body, right lower leg, initial encounter: Secondary | ICD-10-CM | POA: Diagnosis not present

## 2023-05-18 DIAGNOSIS — Z79899 Other long term (current) drug therapy: Secondary | ICD-10-CM | POA: Insufficient documentation

## 2023-05-18 DIAGNOSIS — M1712 Unilateral primary osteoarthritis, left knee: Secondary | ICD-10-CM | POA: Diagnosis not present

## 2023-05-18 DIAGNOSIS — I10 Essential (primary) hypertension: Secondary | ICD-10-CM | POA: Insufficient documentation

## 2023-05-18 DIAGNOSIS — S8991XA Unspecified injury of right lower leg, initial encounter: Secondary | ICD-10-CM | POA: Diagnosis not present

## 2023-05-18 DIAGNOSIS — M85862 Other specified disorders of bone density and structure, left lower leg: Secondary | ICD-10-CM | POA: Diagnosis not present

## 2023-05-18 DIAGNOSIS — S81819A Laceration without foreign body, unspecified lower leg, initial encounter: Secondary | ICD-10-CM

## 2023-05-18 DIAGNOSIS — M79661 Pain in right lower leg: Secondary | ICD-10-CM | POA: Diagnosis not present

## 2023-05-18 DIAGNOSIS — S8992XA Unspecified injury of left lower leg, initial encounter: Secondary | ICD-10-CM | POA: Diagnosis not present

## 2023-05-18 DIAGNOSIS — M79662 Pain in left lower leg: Secondary | ICD-10-CM | POA: Diagnosis not present

## 2023-05-18 DIAGNOSIS — W268XXA Contact with other sharp object(s), not elsewhere classified, initial encounter: Secondary | ICD-10-CM | POA: Diagnosis not present

## 2023-05-18 LAB — URINE CULTURE

## 2023-05-18 MED ORDER — BACITRACIN ZINC 500 UNIT/GM EX OINT
TOPICAL_OINTMENT | Freq: Two times a day (BID) | CUTANEOUS | Status: DC
  Administered 2023-05-18: 2 via TOPICAL
  Filled 2023-05-18: qty 2.7

## 2023-05-18 MED ORDER — CEPHALEXIN 500 MG PO CAPS: 500.0000 mg | ORAL_CAPSULE | Freq: Two times a day (BID) | ORAL | 0 refills | Status: AC

## 2023-05-18 NOTE — ED Notes (Signed)
Wounds cleaned with NS. Right leg wrapped at this time, left leg left uncovered until edp assesses. No active bleeding at this time. Edp made aware

## 2023-05-18 NOTE — ED Provider Notes (Signed)
[facility] EMERGENCY DEPARTMENT AT [facility] Provider Note   CSN: [id number] Arrival date & time: 05/18/23  1935     History  No chief complaint on file.   Valerie [name] is a 89 y.o. female.  HPI   89 year old female, she presents from [facility] by EMS after she had a slide out of her bed, she was trying to get up to use the bathroom when she accidentally caught her legs on the edge of the bed sliding to the ground.  She denies any pain in her head back chest or abdomen, no pain in the hips or knees, she has some pain in her lower extremities where she suffered some skin tears, the paramedics wrapped these up with sterile dressings and transported her to the hospital noting that she was slightly hypertensive but had no other abnormal vital signs.  She evidently lives with her grandson who is and route to the hospital.  The patient is on Eliquis, amlodipine, carvedilol, losartan and a statin.  It is reported that the patient is evidently being treated for a urinary tract infection.  Review of the medical record shows that this patient saw her primary doctor via video visit approximately 4 days ago, she was given ciprofloxacin twice a day, allergies noted to sulfa medications only  I have reviewed the urinary culture which showed Enterococcus faecalis, sensitive to ciprofloxacin  Home Medications Prior to Admission medications   Medication Sig Start Date End Date Taking? Authorizing Provider  cephALEXin (KEFLEX) 500 MG capsule Take 1 capsule (500 mg total) by mouth 2 (two) times daily for 5 days. 05/18/23 05/23/23 Yes Eber Hong, MD  acetaminophen (TYLENOL) 325 MG tablet Take 2 tablets (650 mg total) by mouth every 6 (six) hours as needed for headache or mild pain (Rib cage pain). 08/31/22   Shon Hale, MD  amLODipine (NORVASC) 5 MG tablet Take 1 tablet (5 mg total) by mouth daily. For BP 01/25/23 01/25/24  Daphine Deutscher, Mary-Margaret, FNP  apixaban (ELIQUIS) 2.5 MG TABS  tablet TAKE  (1)  TABLET TWICE A DAY. 01/25/23   Daphine Deutscher, Mary-Margaret, FNP  carvedilol (COREG) 3.125 MG tablet TAKE  (1)  TABLET TWICE A DAY WITH MEALS (BREAKFAST AND SUPPER) 01/25/23   Daphine Deutscher, Mary-Margaret, FNP  Cholecalciferol (VITAMIN D3) 1.25 MG (50000 UT) TABS Take 1 capsule by mouth every 7 (seven) days. Monday 01/25/23   Daphine Deutscher Mary-Margaret, FNP  ciprofloxacin (CIPRO) 500 MG tablet Take 1 tablet (500 mg total) by mouth 2 (two) times daily. 05/14/23   Daphine Deutscher, Mary-Margaret, FNP  citalopram (CELEXA) 20 MG tablet Take 1 tablet (20 mg total) by mouth daily. 01/25/23   Daphine Deutscher, Mary-Margaret, FNP  doxycycline (VIBRA-TABS) 100 MG tablet Take 1 tablet (100 mg total) by mouth 2 (two) times daily. 1 po bid 04/09/23   Daphine Deutscher, Mary-Margaret, FNP  fluticasone (FLONASE) 50 MCG/ACT nasal spray PLACE 2 SPRAYS INTO BOTH NOSTRILS AS NEEDED FOR ALLERGIES OR RHINITIS 09/16/22   Daphine Deutscher, Mary-Margaret, FNP  lidocaine (LIDODERM) 5 % Place 1 patch onto the skin daily. Apply to Rib  pain area daily ----Remove & Discard patch within 12 hours or as directed by MD 09/01/22   Shon Hale, MD  losartan (COZAAR) 50 MG tablet TAKE ONE TABLET DAILY FOR BLOOD PRESSURE 01/25/23   Daphine Deutscher, Mary-Margaret, FNP  methimazole (TAPAZOLE) 5 MG tablet Take 0.5 tablets (2.5 mg total) by mouth daily. 01/25/23   Daphine Deutscher Mary-Margaret, FNP  methocarbamol (ROBAXIN) 500 MG tablet Take 1 tablet (500 mg total)  by mouth 2 (two) times daily. Muscle relaxant 01/25/23   Daphine Deutscher, Mary-Margaret, FNP  nitroGLYCERIN (NITROSTAT) 0.4 MG SL tablet Place 1 tablet (0.4 mg total) under the tongue every 5 (five) minutes as needed for chest pain. MAX 2 TAB 08/31/22 08/31/23  Shon Hale, MD  pantoprazole (PROTONIX) 40 MG tablet Take 1 tablet (40 mg total) by mouth daily. 01/25/23   Daphine Deutscher, Mary-Margaret, FNP  polyethylene glycol (MIRALAX / GLYCOLAX) 17 g packet Take 17 g by mouth daily. 09/01/22   Shon Hale, MD  polyvinyl alcohol (LIQUIFILM TEARS) 1.4 %  ophthalmic solution Place 1 drop into both eyes as needed for dry eyes.    [provider]  pravastatin (PRAVACHOL) 10 MG tablet Take 1 tablet (10 mg total) by mouth daily. 01/25/23   Daphine Deutscher, Mary-Margaret, FNP  propranolol (INDERAL) 10 MG tablet Take 1 tablet (10 mg total) by mouth 4 (four) times daily as needed. 01/25/23   Bennie Pierini, FNP      Allergies    Aricept Palma Holter hcl], Lisinopril, Sulfa antibiotics, and Sulfasalazine    Review of Systems   Review of Systems  All other systems reviewed and are negative.   Physical Exam Updated Vital Signs BP (!) 170/86   Pulse 62   Temp 97.9 F (36.6 C)   Resp 20   Ht 1.524 m (5')   Wt 42 kg   SpO2 97%   BMI 18.08 kg/m  Physical Exam Vitals and nursing note reviewed.  Constitutional:      General: She is not in acute distress.    Appearance: She is well-developed.  HENT:     Head: Normocephalic and atraumatic.     Mouth/Throat:     Pharynx: No oropharyngeal exudate.  Eyes:     General: No scleral icterus.       Right eye: No discharge.        Left eye: No discharge.     Conjunctiva/sclera: Conjunctivae normal.     Pupils: Pupils are equal, round, and reactive to light.  Neck:     Thyroid: No thyromegaly.     Vascular: No JVD.  Cardiovascular:     Rate and Rhythm: Normal rate and regular rhythm.     Heart sounds: Normal heart sounds. No murmur heard.    No friction rub. No gallop.  Pulmonary:     Effort: Pulmonary effort is normal. No respiratory distress.     Breath sounds: Normal breath sounds. No wheezing or rales.  Abdominal:     General: Bowel sounds are normal. There is no distension.     Palpations: Abdomen is soft. There is no mass.     Tenderness: There is no abdominal tenderness.  Musculoskeletal:        General: Signs of injury present. No tenderness. Normal range of motion.     Cervical back: Normal range of motion and neck supple.  Lymphadenopathy:     Cervical: No cervical  adenopathy.  Skin:    General: Skin is warm and dry.     Findings: No erythema or rash.     Comments: Lacerations noted to the right medial lower extremity as well as the left lower extremity just proximal to the medial malleolus, pulses are totally normal at the feet  Neurological:     Mental Status: She is alert.     Coordination: Coordination normal.  Psychiatric:        Behavior: Behavior normal.     ED Results / Procedures / Treatments  Labs (all labs ordered are listed, but only abnormal results are displayed) Labs Reviewed - No data to display  EKG None  Radiology DG Tibia/Fibula Right  Result Date: 05/18/2023 CLINICAL DATA:  Pain after fall EXAM: RIGHT TIBIA AND FIBULA - 2 VIEW COMPARISON:  None Available. FINDINGS: Demineralization. There is no evidence of fracture or other focal bone lesions. Degenerative arthritis right knee. Soft tissues are unremarkable. IMPRESSION: No acute fracture or dislocation. Electronically Signed   By: Minerva Fester M.D.   On: 05/18/2023 20:39   DG Tibia/Fibula Left  Result Date: 05/18/2023 CLINICAL DATA:  Pain after fall EXAM: LEFT TIBIA AND FIBULA - 2 VIEW COMPARISON:  None Available. FINDINGS: There is no evidence of fracture or other focal bone lesions. Soft tissues are unremarkable. The bones are diffusely osteopenic. Mild degenerative changes affect the knee. IMPRESSION: 1. No acute findings. 2. Osteopenia. Electronically Signed   By: Darliss Cheney M.D.   On: 05/18/2023 20:35    Procedures .Marland KitchenLaceration Repair  Date/Time: 05/18/2023 8:48 PM  Performed by: Eber Hong, MD Authorized by: Eber Hong, MD   Consent:    Consent obtained:  Verbal   Consent given by:  Patient   Risks, benefits, and alternatives were discussed: yes     Risks discussed:  Infection, pain, poor cosmetic result, need for additional repair and poor wound healing   Alternatives discussed:  No treatment Universal protocol:    Procedure explained and  questions answered to patient or proxy's satisfaction: yes     Immediately prior to procedure, a time out was called: yes     Patient identity confirmed:  Verbally with patient Anesthesia:    Anesthesia method:  None Laceration details:    Location:  Leg   Leg location:  R lower leg   Length (cm):  6   Depth (mm):  1 Pre-procedure details:    Preparation:  Imaging obtained to evaluate for foreign bodies Exploration:    Limited defect created (wound extended): no     Imaging obtained: x-ray     Imaging outcome: foreign body not noted     Wound exploration: wound explored through full range of motion and entire depth of wound visualized     Contaminated: no   Treatment:    Area cleansed with:  Saline   Amount of cleaning:  Extensive   Irrigation solution:  Sterile saline   Debridement:  None Skin repair:    Repair method:  Steri-Strips   Number of Steri-Strips:  10 Approximation:    Approximation:  Loose Repair type:    Repair type:  Simple Post-procedure details:    Dressing:  Tube gauze   Procedure completion:  Tolerated well, no immediate complications Comments:           Medications Ordered in ED Medications  bacitracin ointment (has no administration in time range)    ED Course/ Medical Decision Making/ A&P                                 Medical Decision Making Amount and/or Complexity of Data Reviewed Radiology: ordered.  Risk OTC drugs. Prescription drug management.    This patient presents to the ED for concern of fall with some mild skin tears differential diagnosis includes loss of balance, she has a totally normal mental status and follows commands with normal strength and is able to sit up in the bed by herself, she is on  the appropriate medication for the urinary infection based on the culture report    Additional history obtained:  Additional history obtained from medical record External records from outside source obtained and reviewed  including outpatient video visit recently    Imaging Studies ordered:  I ordered imaging studies including x-rays of the bilateral tibia and fibula I independently visualized and interpreted imaging which showed no acute fractures I agree with the radiologist interpretation   Medicines ordered and prescription drug management:  I ordered medication including bacitracin for wounds Reevaluation of the patient after these medicines showed that the patient improved, I dressed her wounds myself after repairing with Steri-Strips I have reviewed the patients home medicines and have made adjustments as needed   Problem List / ED Course:  Wound care given Dressings applied after Steri-Strips Patient is well-appearing with unremarkable vital signs except for mild hypertension   Social Determinants of Health:  Elderly           Final Clinical Impression(s) / ED Diagnoses Final diagnoses:  Skin tear of lower leg without complication, initial encounter    Rx / DC Orders ED Discharge Orders          Ordered    cephALEXin (KEFLEX) 500 MG capsule  2 times daily        05/18/23 2047              Eber Hong, MD 05/18/23 2049

## 2023-05-18 NOTE — ED Triage Notes (Signed)
Pt arrives via [facility] from home after she fell getting out of her bed, pt has two skin tears on lower extremities. Denies hitting head or LOC. Pt is on Eliquis.

## 2023-05-18 NOTE — Discharge Instructions (Signed)
Please apply bacitracin ointment, apply a sterile dressing, change this once a day  See your family doctor within 1 week for a recheck of the wound  Cephalexin twice a day to help prevent infection  Emergency department for severe or worsening pain swelling redness pus or fever.  Tylenol 500 mg every 6 hours as needed for severe pain

## 2023-05-20 MED ORDER — DOXYCYCLINE HYCLATE 100 MG PO TABS: 100.0000 mg | ORAL_TABLET | Freq: Two times a day (BID) | ORAL | 0 refills | Status: DC

## 2023-05-20 NOTE — Addendum Note (Signed)
Addended by: Bennie Pierini on: 05/20/2023 09:38 AM   Modules accepted: Orders

## 2023-05-24 ENCOUNTER — Ambulatory Visit (INDEPENDENT_AMBULATORY_CARE_PROVIDER_SITE_OTHER): Payer: Medicare PPO | Admitting: Nurse Practitioner

## 2023-05-24 ENCOUNTER — Encounter: Payer: Self-pay | Admitting: Nurse Practitioner

## 2023-05-24 VITALS — BP 144/65 | HR 59 | Temp 97.0°F | Resp 20 | Ht 60.0 in | Wt 100.0 lb

## 2023-05-24 DIAGNOSIS — Z09 Encounter for follow-up examination after completed treatment for conditions other than malignant neoplasm: Secondary | ICD-10-CM | POA: Diagnosis not present

## 2023-05-24 DIAGNOSIS — S81812D Laceration without foreign body, left lower leg, subsequent encounter: Secondary | ICD-10-CM | POA: Diagnosis not present

## 2023-05-24 NOTE — Progress Notes (Signed)
Subjective:    Patient ID: Valerie [name], female    DOB: 11/12/1927, 89 y.o.   MRN: [id number]   Chief Complaint: hospital follow up  HPI  Patient went to the ED by EMS on 05/18/23. She said she slide out of her bed trying to get up and go to the bathroom. She lacerated her left lower leg. Otherwise was good. No fractures. Patient Active Problem List   Diagnosis Date Noted   Vitamin D deficiency 01/25/2023   Depression, recurrent (HCC) 01/25/2023   Syncope and collapse 08/30/2022   Closed Rt rib fracture -3rd thru 7th 08/30/2022   Compression fracture of T11 vertebra (HCC) 07/21/2022   Malnutrition of moderate degree 03/18/2021   Essential hypertension 03/16/2021   Femoral neck fracture (HCC) 03/15/2021   Hip fracture (HCC) 03/15/2021   Moderate to severe aortic insufficiency 12/05/2019   Paroxysmal Atrial fibrillation (HCC) 03/06/2019   Congestive heart failure (CHF) (HCC) 02/17/2019   Left thyroid nodule 01/24/2019   Hyperthyroidism 01/14/2019   Bradycardia 04/06/2018   PVC (premature ventricular contraction) 11/26/2016   Hyperlipidemia 11/26/2016   Dislocated IOL (intraocular lens), posterior, right 10/20/2016   Posterior dislocation of iol (intraocular lens), right eye 10/20/2016   History of palpitations 06/30/2016   Blurry vision, bilateral    Glaucoma 05/25/2016   Thyroid nodule 05/20/2015   Carotid stenosis, non-symptomatic 09/14/2012   Internal and external hemorrhoids without complication 10/05/2011   Vitamin B12 deficiency 09/15/2011   Benign hypertensive heart disease without heart failure 05/22/2011   Stricture esophagus 05/18/2011   Stricture of duodenum 05/18/2011   Iron deficiency anemia 05/15/2011   Diverticulosis of colon 02/03/2008       Review of Systems  Constitutional:  Negative for diaphoresis.  Eyes:  Negative for pain.  Respiratory:  Negative for shortness of breath.   Cardiovascular:  Negative for chest pain, palpitations and leg swelling.   Gastrointestinal:  Negative for abdominal pain.  Endocrine: Negative for polydipsia.  Skin:  Negative for rash.  Neurological:  Negative for dizziness, weakness and headaches.  Hematological:  Does not bruise/bleed easily.  All other systems reviewed and are negative.      Objective:   Physical Exam Constitutional:      Appearance: Normal appearance.  Cardiovascular:     Rate and Rhythm: Normal rate and regular rhythm.     Heart sounds: Normal heart sounds.  Pulmonary:     Effort: Pulmonary effort is normal.     Breath sounds: Normal breath sounds.  Skin:    General: Skin is warm.     Comments: Wound edges well approximated No erythema or edema Steri strips in place  Neurological:     General: No focal deficit present.     Mental Status: She is alert and oriented to person, place, and time.  Psychiatric:        Mood and Affect: Mood normal.        Behavior: Behavior normal.    BP (!) 144/65   Pulse (!) 59   Temp (!) 97 F (36.1 C) (Temporal)   Resp 20   Ht 5' (1.524 m)   Wt 100 lb (45.4 kg)   SpO2 97%   BMI 19.53 kg/m         Assessment & Plan:   Valerie [name] in today with chief complaint of Hospitalization Follow-up   1. Noninfected skin tear of left lower extremity, subsequent encounter Keep clean and dry Steri strips will fall off on there own  2. Hospital discharge follow-up Hospital records reviewed    The above assessment and management plan was discussed with the patient. The patient verbalized understanding of and has agreed to the management plan. Patient is aware to call the clinic if symptoms persist or worsen. Patient is aware when to return to the clinic for a follow-up visit. Patient educated on when it is appropriate to go to the emergency department.   [name], FNP

## 2023-05-25 ENCOUNTER — Telehealth: Payer: Self-pay

## 2023-05-25 NOTE — Telephone Encounter (Signed)
Transition Care Management Unsuccessful Follow-up Telephone Call  Date of discharge and from where:  Valerie [name] 8/20  Attempts:  1st Attempt  Reason for unsuccessful TCM follow-up call:  No answer/busy   Lenard Forth [place]  [facility], [facility] Guide, Phone: [phone] Website: [email or web address]

## 2023-05-26 ENCOUNTER — Telehealth: Payer: Self-pay

## 2023-05-26 NOTE — Telephone Encounter (Signed)
Transition Care Management Unsuccessful Follow-up Telephone Call  Date of discharge and from where:  Valerie [name] 8/20  Attempts:  2nd Attempt  Reason for unsuccessful TCM follow-up call:  No answer/busy   Valerie [name]  [facility], [facility] Guide, Phone: [phone] Website: [email or web address]

## 2023-06-02 DIAGNOSIS — M545 Low back pain, unspecified: Secondary | ICD-10-CM | POA: Diagnosis not present

## 2023-06-02 DIAGNOSIS — M8580 Other specified disorders of bone density and structure, unspecified site: Secondary | ICD-10-CM | POA: Diagnosis not present

## 2023-06-02 DIAGNOSIS — K219 Gastro-esophageal reflux disease without esophagitis: Secondary | ICD-10-CM | POA: Diagnosis not present

## 2023-06-02 DIAGNOSIS — Z681 Body mass index (BMI) 19 or less, adult: Secondary | ICD-10-CM | POA: Diagnosis not present

## 2023-06-02 DIAGNOSIS — F329 Major depressive disorder, single episode, unspecified: Secondary | ICD-10-CM | POA: Diagnosis not present

## 2023-06-02 DIAGNOSIS — I10 Essential (primary) hypertension: Secondary | ICD-10-CM | POA: Diagnosis not present

## 2023-06-02 DIAGNOSIS — I2510 Atherosclerotic heart disease of native coronary artery without angina pectoris: Secondary | ICD-10-CM | POA: Diagnosis not present

## 2023-06-02 DIAGNOSIS — J309 Allergic rhinitis, unspecified: Secondary | ICD-10-CM | POA: Diagnosis not present

## 2023-06-02 DIAGNOSIS — D6869 Other thrombophilia: Secondary | ICD-10-CM | POA: Diagnosis not present

## 2023-06-02 DIAGNOSIS — E785 Hyperlipidemia, unspecified: Secondary | ICD-10-CM | POA: Diagnosis not present

## 2023-06-02 DIAGNOSIS — M1990 Unspecified osteoarthritis, unspecified site: Secondary | ICD-10-CM | POA: Diagnosis not present

## 2023-06-13 ENCOUNTER — Telehealth: Payer: Medicare PPO | Admitting: Nurse Practitioner

## 2023-06-13 DIAGNOSIS — R399 Unspecified symptoms and signs involving the genitourinary system: Secondary | ICD-10-CM

## 2023-06-13 MED ORDER — CEPHALEXIN 500 MG PO CAPS
500.0000 mg | ORAL_CAPSULE | Freq: Two times a day (BID) | ORAL | 0 refills | Status: AC
Start: 2023-06-13 — End: 2023-06-20

## 2023-06-13 NOTE — Progress Notes (Signed)

## 2023-06-13 NOTE — Progress Notes (Signed)
I have spent 5 minutes in review of e-visit questionnaire, review and updating patient chart, medical decision making and response to patient.  ° °Jerrell Mangel W Secilia Apps, NP ° °  °

## 2023-06-23 ENCOUNTER — Telehealth: Payer: Medicare PPO | Admitting: Physician Assistant

## 2023-06-23 ENCOUNTER — Ambulatory Visit (INDEPENDENT_AMBULATORY_CARE_PROVIDER_SITE_OTHER): Payer: Medicare PPO

## 2023-06-23 ENCOUNTER — Ambulatory Visit (INDEPENDENT_AMBULATORY_CARE_PROVIDER_SITE_OTHER): Payer: Medicare PPO | Admitting: Family Medicine

## 2023-06-23 ENCOUNTER — Encounter: Payer: Self-pay | Admitting: Family Medicine

## 2023-06-23 VITALS — BP 164/67 | HR 55 | Temp 96.9°F | Ht 61.0 in | Wt 99.8 lb

## 2023-06-23 DIAGNOSIS — Z96612 Presence of left artificial shoulder joint: Secondary | ICD-10-CM | POA: Diagnosis not present

## 2023-06-23 DIAGNOSIS — R059 Cough, unspecified: Secondary | ICD-10-CM

## 2023-06-23 DIAGNOSIS — R918 Other nonspecific abnormal finding of lung field: Secondary | ICD-10-CM | POA: Diagnosis not present

## 2023-06-23 DIAGNOSIS — Z96611 Presence of right artificial shoulder joint: Secondary | ICD-10-CM | POA: Diagnosis not present

## 2023-06-23 DIAGNOSIS — J069 Acute upper respiratory infection, unspecified: Secondary | ICD-10-CM

## 2023-06-23 MED ORDER — GUAIFENESIN ER 600 MG PO TB12
600.0000 mg | ORAL_TABLET | Freq: Two times a day (BID) | ORAL | 0 refills | Status: AC
Start: 2023-06-23 — End: 2023-07-03

## 2023-06-23 MED ORDER — FLUTICASONE PROPIONATE 50 MCG/ACT NA SUSP
2.0000 | NASAL | 5 refills | Status: DC | PRN
Start: 2023-06-23 — End: 2024-02-14

## 2023-06-23 NOTE — Progress Notes (Signed)
Because of need for lung exam to rule out concern for pneumonia, I feel your condition warrants further evaluation and I recommend that you be seen in a face to face visit.   NOTE: There will be NO CHARGE for this eVisit   If you are having a true medical emergency please call 911.      For an urgent face to face visit, [place] has eight urgent care centers for your convenience:   NEW!! [facility] Health Urgent Care Center at [facility] Get Driving Directions [phone] [address], [address] [place], [address]    [facility] Health Urgent Care Center at [facility] Get Driving Directions [phone] [address] [place], [place] [address]   [facility] Health Urgent Care Center [facility]) Get Driving Directions [phone] [address] [place], [place] [address]  [facility] Health Urgent Care Center [facility]) Get Driving Directions [phone] [address] [place],  [place]  [address]  [facility] Health Urgent Care Center [facility] - at [employer]  [phone] [address] [place],  [place] [address]   [facility] Health Urgent Care at [facility] Get Driving Directions [phone] [address] [place], [place] [address]   [facility] Health Urgent Care at [facility] Get Driving Directions  [phone] [address] [place], [place] [address]   [facility] Health Urgent Care at [facility] Directions [phone] [address], [address] [place], [place] [address]  Your MyChart E-visit questionnaire answers were reviewed by a board certified advanced clinical practitioner to complete your personal care plan based on your specific symptoms.  Thank you for using e-Visits.

## 2023-06-23 NOTE — Progress Notes (Signed)
Subjective:  Patient ID: Valerie [name], female    DOB: 1927-10-30, 89 y.o.   MRN: [id number]  Patient Care Team: Bennie Pierini, FNP as PCP - General (Family Medicine) Nahser, Deloris Ping, MD as PCP - Cardiology (Cardiology) Earlean Shawl, MD as Referring Physician (Ophthalmology) Danella Maiers, [facility] as [facility] Care Management (Pharmacist)   Chief Complaint:  Cough and Shortness of Breath   HPI: Valerie [name] is a 89 y.o. female presenting on 06/23/2023 for Cough and Shortness of Breath   Ongoing cough with white sputum production. Has not been using Flonase or taking Coricidin HBP as needed for the cough and sputum production. No leg swelling. No fever, chills, weakness, or confusion.   Cough This is a recurrent problem. The problem has been waxing and waning. The cough is Productive of sputum. Associated symptoms include shortness of breath. Pertinent negatives include no chest pain, chills, ear congestion, ear pain, fever, headaches, heartburn, hemoptysis, myalgias, nasal congestion, postnasal drip, rash, rhinorrhea, sore throat, sweats, weight loss or wheezing. Associated symptoms comments: Denies PND or orthopnea.. Nothing aggravates the symptoms. She has tried nothing for the symptoms.  Shortness of Breath This is a recurrent problem. The problem has been waxing and waning. Associated symptoms include sputum production (thick, clear). Pertinent negatives include no abdominal pain, chest pain, claudication, coryza, ear pain, fever, headaches, hemoptysis, leg pain, leg swelling, neck pain, orthopnea, PND, rash, rhinorrhea, sore throat, swollen glands, syncope, vomiting or wheezing. Nothing aggravates the symptoms. She has tried nothing for the symptoms.    Relevant past medical, surgical, family, and social history reviewed and updated as indicated.  Allergies and medications reviewed and updated. Data reviewed: Chart in Epic.   Past Medical History:   Diagnosis Date   Anxiety    Arthritis    Broken ribs    Carotid artery disease (HCC)    Cataract    Collar bone fracture    Right   Depression    Diverticulosis of colon with hemorrhage    Duodenitis    Esophageal reflux    Esophageal stricture    Essential hypertension    Glaucoma    Hiatal hernia    History of kidney stones     x1    Hyperlipidemia    IBS (irritable bowel syndrome)    Palpitations    UTI (urinary tract infection) March 2015    Past Surgical History:  Procedure Laterality Date   CHOLECYSTECTOMY     COLONOSCOPY     DILATION AND CURETTAGE OF UTERUS     EYE SURGERY Bilateral    total of 5   HIP ARTHROPLASTY Right 03/16/2021   Procedure: POSTERIOR ARTHROPLASTY BIPOLAR HIP (HEMIARTHROPLASTY);  Surgeon: Eldred Manges, MD;  Location: [facility] OR;  Service: Orthopedics;  Laterality: Right;   LASER PHOTO ABLATION Right 10/20/2016   Procedure: LASER PHOTO ABLATION;  Surgeon: Sherrie George, MD;  Location: [facility] OR;  Service: Ophthalmology;  Laterality: Right;  Headscope laser   LEFT ROTATOR CUFF REPAIR X2 Left    PARS PLANA VITRECTOMY  10/20/2016   WITH 25G REMOVAL/SUTURE SECONDARY INTRAOCULAR LENS, GAS FLUID EXCHANGE    PARS PLANA VITRECTOMY Right 10/20/2016   Procedure: PARS PLANA VITRECTOMY WITH 25G REMOVAL/SUTURE SECONDARY INTRAOCULAR LENS, GAS FLUID EXCHANGE;  Surgeon: Sherrie George, MD;  Location: [facility] OR;  Service: Ophthalmology;  Laterality: Right;   RIGHT ROTATOR CUFF REPAIR X1 Right    UPPER GASTROINTESTINAL ENDOSCOPY     with  dilation    Social History   Socioeconomic History   Marital status: Widowed    Spouse name: Not on file   Number of children: 4   Years of education: Not on file   Highest education level: 12th grade  Occupational History   Occupation: retired    Associate Professor: RETIRED  Tobacco Use   Smoking status: Never   Smokeless tobacco: Never  Vaping Use   Vaping status: Never Used  Substance and Sexual Activity   Alcohol use: No   Drug  use: No   Sexual activity: Not on file  Other Topics Concern   Not on file  Social History Narrative   Her grandson lives with her   She is legally blind   Social Determinants of Health   Financial Resource Strain: Low Risk  (05/20/2023)   Overall Financial Resource Strain (CARDIA)    Difficulty of Paying Living Expenses: Not hard at all  Food Insecurity: No Food Insecurity (05/20/2023)   Hunger Vital Sign    Worried About Running Out of Food in the Last Year: Never true    Ran Out of Food in the Last Year: Never true  Transportation Needs: No Transportation Needs (05/20/2023)   PRAPARE - Administrator, Civil Service (Medical): No    Lack of Transportation (Non-Medical): No  Physical Activity: Unknown (05/20/2023)   Exercise Vital Sign    Days of Exercise per Week: Patient declined    Minutes of Exercise per Session: Not on file  Stress: No Stress Concern Present (05/20/2023)   [employer] of Occupational Health - Occupational Stress Questionnaire    Feeling of Stress : Only a little  Social Connections: Unknown (05/20/2023)   Social Connection and Isolation Panel [NHANES]    Frequency of Communication with Friends and Family: Patient declined    Frequency of Social Gatherings with Friends and Family: Patient declined    Attends Religious Services: Patient declined    Database administrator or Organizations: Patient declined    Attends Banker Meetings: Not on file    Marital Status: Widowed  Intimate Partner Violence: Not At Risk (08/31/2022)   Humiliation, Afraid, Rape, and Kick questionnaire    Fear of Current or Ex-Partner: No    Emotionally Abused: No    Physically Abused: No    Sexually Abused: No    Outpatient Encounter Medications as of 06/23/2023  Medication Sig   acetaminophen (TYLENOL) 325 MG tablet Take 2 tablets (650 mg total) by mouth every 6 (six) hours as needed for headache or mild pain (Rib cage pain).   amLODipine (NORVASC) 5 MG  tablet Take 1 tablet (5 mg total) by mouth daily. For BP   apixaban (ELIQUIS) 2.5 MG TABS tablet TAKE  (1)  TABLET TWICE A DAY.   carvedilol (COREG) 3.125 MG tablet TAKE  (1)  TABLET TWICE A DAY WITH MEALS (BREAKFAST AND SUPPER)   guaiFENesin (MUCINEX) 600 MG 12 hr tablet Take 1 tablet (600 mg total) by mouth 2 (two) times daily for 10 days.   losartan (COZAAR) 50 MG tablet TAKE ONE TABLET DAILY FOR BLOOD PRESSURE   methimazole (TAPAZOLE) 5 MG tablet Take 0.5 tablets (2.5 mg total) by mouth daily.   methocarbamol (ROBAXIN) 500 MG tablet Take 1 tablet (500 mg total) by mouth 2 (two) times daily. Muscle relaxant   nitroGLYCERIN (NITROSTAT) 0.4 MG SL tablet Place 1 tablet (0.4 mg total) under the tongue every 5 (five) minutes as needed for  chest pain. MAX 2 TAB   pantoprazole (PROTONIX) 40 MG tablet Take 1 tablet (40 mg total) by mouth daily.   polyethylene glycol (MIRALAX / GLYCOLAX) 17 g packet Take 17 g by mouth daily.   polyvinyl alcohol (LIQUIFILM TEARS) 1.4 % ophthalmic solution Place 1 drop into both eyes as needed for dry eyes.   pravastatin (PRAVACHOL) 10 MG tablet Take 1 tablet (10 mg total) by mouth daily.   [DISCONTINUED] fluticasone (FLONASE) 50 MCG/ACT nasal spray PLACE 2 SPRAYS INTO BOTH NOSTRILS AS NEEDED FOR ALLERGIES OR RHINITIS   fluticasone (FLONASE) 50 MCG/ACT nasal spray Place 2 sprays into both nostrils as needed for allergies or rhinitis.   No facility-administered encounter medications on file as of 06/23/2023.    Allergies  Allergen Reactions   Aricept [Donepezil Hcl] Other (See Comments)    Nightmares, near syncope, weak, decreased appetite.   Lisinopril Other (See Comments)    Hair loss   Sulfa Antibiotics Nausea Only   Sulfasalazine Nausea Only    Review of Systems  Constitutional:  Negative for activity change, appetite change, chills, diaphoresis, fatigue, fever, unexpected weight change and weight loss.  HENT:  Negative for ear pain, postnasal drip,  rhinorrhea and sore throat.   Eyes:  Negative for photophobia and visual disturbance.  Respiratory:  Positive for cough, sputum production (thick, clear) and shortness of breath. Negative for apnea, hemoptysis, choking, chest tightness, wheezing and stridor.   Cardiovascular:  Negative for chest pain, palpitations, orthopnea, claudication, leg swelling, syncope and PND.  Gastrointestinal:  Negative for abdominal pain, heartburn and vomiting.  Endocrine: Negative for polydipsia, polyphagia and polyuria.  Genitourinary:  Negative for decreased urine volume and difficulty urinating.  Musculoskeletal:  Negative for myalgias and neck pain.  Skin:  Negative for rash.  Neurological:  Negative for dizziness, tremors, seizures, syncope, facial asymmetry, speech difficulty, weakness, light-headedness, numbness and headaches.  Psychiatric/Behavioral:  Negative for confusion.   All other systems reviewed and are negative.       Objective:  BP (!) 164/67   Pulse (!) 55   Temp (!) 96.9 F (36.1 C) (Temporal)   Ht 5\' 1"  (1.549 m)   Wt 99 lb 12.8 oz (45.3 kg)   SpO2 91%   BMI 18.86 kg/m    Wt Readings from Last 3 Encounters:  06/23/23 99 lb 12.8 oz (45.3 kg)  05/24/23 100 lb (45.4 kg)  05/18/23 92 lb 9.5 oz (42 kg)    Physical Exam Vitals and nursing note reviewed.  Constitutional:      General: She is not in acute distress.    Appearance: She is normal weight. She is ill-appearing (frail elderly). She is not toxic-appearing or diaphoretic.  HENT:     Head: Normocephalic and atraumatic.     Right Ear: Decreased hearing noted.     Left Ear: Decreased hearing noted.     Mouth/Throat:     Mouth: Mucous membranes are moist.     Pharynx: Oropharynx is clear.  Cardiovascular:     Rate and Rhythm: Normal rate. Rhythm irregular.     Pulses: Normal pulses.     Heart sounds: Murmur heard.     Systolic murmur is present with a grade of 2/6.     No S3 or S4 sounds.  Pulmonary:     Effort:  Pulmonary effort is normal.     Breath sounds: Normal breath sounds. No wheezing, rhonchi or rales.  Musculoskeletal:     Cervical back: Normal range of motion  and neck supple.     Right lower leg: No edema.     Left lower leg: No edema.  Skin:    General: Skin is warm and dry.     Capillary Refill: Capillary refill takes less than 2 seconds.  Neurological:     General: No focal deficit present.     Mental Status: She is alert.     Gait: Gait abnormal (using rolling walker).  Psychiatric:        Mood and Affect: Mood normal.        Behavior: Behavior normal.        Thought Content: Thought content normal.        Judgment: Judgment normal.     Results for orders placed or performed in visit on 05/14/23  Urine Culture   Specimen: Urine   UR  Result Value Ref Range   Urine Culture, Routine Final report (A)    Organism ID, Bacteria Enterococcus faecalis (A)    Antimicrobial Susceptibility Comment   Microscopic Examination   Urine  Result Value Ref Range   WBC, UA None seen 0 - 5 /hpf   RBC, Urine None seen 0 - 2 /hpf   Epithelial Cells (non renal) None seen 0 - 10 /hpf   Renal Epithel, UA None seen None seen /hpf   Bacteria, UA Few (A) None seen/Few  Urinalysis, Complete  Result Value Ref Range   Specific Gravity, UA <1.005 (L) 1.005 - 1.030   pH, UA 6.0 5.0 - 7.5   Color, UA Yellow Yellow   Appearance Ur Clear Clear   Leukocytes,UA Negative Negative   Protein,UA Negative Negative/Trace   Glucose, UA Negative Negative   Ketones, UA Negative Negative   RBC, UA Trace (A) Negative   Bilirubin, UA Negative Negative   Urobilinogen, Ur 0.2 0.2 - 1.0 mg/dL   Nitrite, UA Negative Negative   Microscopic Examination See below:      X-Ray: CXR: No acute findings, no new findings when compared to previous imaging. Preliminary x-ray reading by Kari Baars, FNP-C, [facility].   Pertinent labs & imaging results that were available during my care of the patient were reviewed by me  and considered in my medical decision making.  Assessment & Plan:  Johnisha was seen today for cough and shortness of breath.  Diagnoses and all orders for this visit:  Cough in adult CXR without acute findings. Aware to restart Flonase and Coricidin. Add mucinex with plenty of water to help thin secretions. Aware to report new, worsening, or persistent symptoms.  -     DG Chest 2 View; Future -     fluticasone (FLONASE) 50 MCG/ACT nasal spray; Place 2 sprays into both nostrils as needed for allergies or rhinitis. -     guaiFENesin (MUCINEX) 600 MG 12 hr tablet; Take 1 tablet (600 mg total) by mouth 2 (two) times daily for 10 days.     Continue all other maintenance medications.  Follow up plan: Return if symptoms worsen or fail to improve.   Continue healthy lifestyle choices, including diet (rich in fruits, vegetables, and lean proteins, and low in salt and simple carbohydrates) and exercise (at least 30 minutes of moderate physical activity daily).  The above assessment and management plan was discussed with the patient. The patient verbalized understanding of and has agreed to the management plan. Patient is aware to call the clinic if they develop any new symptoms or if symptoms persist or worsen. Patient is aware when  to return to the clinic for a follow-up visit. Patient educated on when it is appropriate to go to the emergency department.   Kari Baars, FNP-C [facility] [phone]

## 2023-06-30 ENCOUNTER — Encounter: Payer: Self-pay | Admitting: Family Medicine

## 2023-06-30 ENCOUNTER — Encounter: Payer: Medicare PPO | Admitting: Family Medicine

## 2023-06-30 ENCOUNTER — Other Ambulatory Visit: Payer: Self-pay | Admitting: Nurse Practitioner

## 2023-06-30 DIAGNOSIS — R300 Dysuria: Secondary | ICD-10-CM

## 2023-06-30 DIAGNOSIS — E782 Mixed hyperlipidemia: Secondary | ICD-10-CM

## 2023-06-30 LAB — URINALYSIS, COMPLETE
Bilirubin, UA: NEGATIVE
Glucose, UA: NEGATIVE
Ketones, UA: NEGATIVE
Nitrite, UA: NEGATIVE
Protein,UA: NEGATIVE
RBC, UA: NEGATIVE
Specific Gravity, UA: 1.015 (ref 1.005–1.030)
Urobilinogen, Ur: 0.2 mg/dL (ref 0.2–1.0)
pH, UA: 6 (ref 5.0–7.5)

## 2023-06-30 LAB — MICROSCOPIC EXAMINATION
Bacteria, UA: NONE SEEN
Yeast, UA: NONE SEEN

## 2023-06-30 NOTE — Progress Notes (Signed)
Erroneous. Her grandson was in [place] when I finally reached him at 5:15. Valerie [name] did not answer her home line with multiple attempts. The message on voicemail cut off and did not allow for a message to be left.

## 2023-07-15 ENCOUNTER — Encounter: Payer: Self-pay | Admitting: Nurse Practitioner

## 2023-07-15 ENCOUNTER — Telehealth: Payer: Medicare PPO | Admitting: Nurse Practitioner

## 2023-07-15 DIAGNOSIS — R300 Dysuria: Secondary | ICD-10-CM | POA: Insufficient documentation

## 2023-07-15 DIAGNOSIS — Z8669 Personal history of other diseases of the nervous system and sense organs: Secondary | ICD-10-CM

## 2023-07-15 LAB — URINALYSIS, ROUTINE W REFLEX MICROSCOPIC
Bilirubin, UA: NEGATIVE
Glucose, UA: NEGATIVE
Ketones, UA: NEGATIVE
Leukocytes,UA: NEGATIVE
Nitrite, UA: NEGATIVE
Protein,UA: NEGATIVE
RBC, UA: NEGATIVE
Specific Gravity, UA: 1.015 (ref 1.005–1.030)
Urobilinogen, Ur: 0.2 mg/dL (ref 0.2–1.0)
pH, UA: 7 (ref 5.0–7.5)

## 2023-07-15 NOTE — Progress Notes (Signed)
Virtual Visit via video Note Due to COVID-19 pandemic this visit was conducted virtually. This visit type was conducted due to national recommendations for restrictions regarding the COVID-19 Pandemic (e.g. social distancing, sheltering in place) in an effort to limit this patient's exposure and mitigate transmission in our community. All issues noted in this document were discussed and addressed.  A physical exam was not performed with this format.   I connected with Valerie [name] on 07/15/2023 at 1155 by name and DOB and verified that I am speaking with the correct person using two identifiers. Valerie [name] is currently located at home and  son  is currently with them during visit. The provider, Martina Sinner, DNP is located in their office at time of visit.  I discussed the limitations, risks, security and privacy concerns of performing an evaluation and management service by virtual visit and the availability of in person appointments. I also discussed with the patient that there may be a patient responsible charge related to this service. The patient expressed understanding and agreed to proceed.  Subjective:  Patient ID: Valerie [name], female    DOB: [birth year], 89 y.o.   MRN: [id number]  Chief Complaint:  Possible UTI   HPI: Valerie [name] is a 89 y.o. female presenting on 07/15/2023 for Urinary Tract Infection (hallucinations) She has history of glaucoma and son reports she thinks she has a UTI " I see blue, yellow, green in my room". Denies urinary frequency, urgency and dysuria, flank pain, fever, chills, or abnormal vaginal discharge or bleeding.   Past Medical History:  Diagnosis Date   Anxiety    Arthritis    Broken ribs    Carotid artery disease (HCC)    Cataract    Collar bone fracture    Right   Depression    Diverticulosis of colon with hemorrhage    Duodenitis    Esophageal reflux    Esophageal stricture    Essential hypertension    Glaucoma    Hiatal hernia     History of kidney stones     x1    Hyperlipidemia    IBS (irritable bowel syndrome)    Palpitations    UTI (urinary tract infection) March 2015    Past Surgical History:  Procedure Laterality Date   CHOLECYSTECTOMY     COLONOSCOPY     DILATION AND CURETTAGE OF UTERUS     EYE SURGERY Bilateral    total of 5   HIP ARTHROPLASTY Right 03/16/2021   Procedure: POSTERIOR ARTHROPLASTY BIPOLAR HIP (HEMIARTHROPLASTY);  Surgeon: Eldred Manges, MD;  Location: [facility] OR;  Service: Orthopedics;  Laterality: Right;   LASER PHOTO ABLATION Right 10/20/2016   Procedure: LASER PHOTO ABLATION;  Surgeon: Sherrie George, MD;  Location: [facility] OR;  Service: Ophthalmology;  Laterality: Right;  Headscope laser   LEFT ROTATOR CUFF REPAIR X2 Left    PARS PLANA VITRECTOMY  10/20/2016   WITH 25G REMOVAL/SUTURE SECONDARY INTRAOCULAR LENS, GAS FLUID EXCHANGE    PARS PLANA VITRECTOMY Right 10/20/2016   Procedure: PARS PLANA VITRECTOMY WITH 25G REMOVAL/SUTURE SECONDARY INTRAOCULAR LENS, GAS FLUID EXCHANGE;  Surgeon: Sherrie George, MD;  Location: [facility] OR;  Service: Ophthalmology;  Laterality: Right;   RIGHT ROTATOR CUFF REPAIR X1 Right    UPPER GASTROINTESTINAL ENDOSCOPY     with dilation    Social History   Socioeconomic History   Marital status: Widowed    Spouse name: Not on file   Number  of children: 4   Years of education: Not on file   Highest education level: 12th grade  Occupational History   Occupation: retired    Associate Professor: RETIRED  Tobacco Use   Smoking status: Never   Smokeless tobacco: Never  Vaping Use   Vaping status: Never Used  Substance and Sexual Activity   Alcohol use: No   Drug use: No   Sexual activity: Not on file  Other Topics Concern   Not on file  Social History Narrative   Her grandson lives with her   She is legally blind   Social Determinants of Health   Financial Resource Strain: Low Risk  (05/20/2023)   Overall Financial Resource Strain (CARDIA)    Difficulty of Paying  Living Expenses: Not hard at all  Food Insecurity: No Food Insecurity (05/20/2023)   Hunger Vital Sign    Worried About Running Out of Food in the Last Year: Never true    Ran Out of Food in the Last Year: Never true  Transportation Needs: No Transportation Needs (05/20/2023)   PRAPARE - Administrator, Civil Service (Medical): No    Lack of Transportation (Non-Medical): No  Physical Activity: Unknown (05/20/2023)   Exercise Vital Sign    Days of Exercise per Week: Patient declined    Minutes of Exercise per Session: Not on file  Stress: No Stress Concern Present (05/20/2023)   [employer] of Occupational Health - Occupational Stress Questionnaire    Feeling of Stress : Only a little  Social Connections: Unknown (05/20/2023)   Social Connection and Isolation Panel [NHANES]    Frequency of Communication with Friends and Family: Patient declined    Frequency of Social Gatherings with Friends and Family: Patient declined    Attends Religious Services: Patient declined    Database administrator or Organizations: Patient declined    Attends Banker Meetings: Not on file    Marital Status: Widowed  Intimate Partner Violence: Not At Risk (08/31/2022)   Humiliation, Afraid, Rape, and Kick questionnaire    Fear of Current or Ex-Partner: No    Emotionally Abused: No    Physically Abused: No    Sexually Abused: No    Outpatient Encounter Medications as of 07/15/2023  Medication Sig   acetaminophen (TYLENOL) 325 MG tablet Take 2 tablets (650 mg total) by mouth every 6 (six) hours as needed for headache or mild pain (Rib cage pain).   amLODipine (NORVASC) 5 MG tablet Take 1 tablet (5 mg total) by mouth daily. For BP   apixaban (ELIQUIS) 2.5 MG TABS tablet TAKE  (1)  TABLET TWICE A DAY.   carvedilol (COREG) 3.125 MG tablet TAKE  (1)  TABLET TWICE A DAY WITH MEALS (BREAKFAST AND SUPPER)   fluticasone (FLONASE) 50 MCG/ACT nasal spray Place 2 sprays into both nostrils as  needed for allergies or rhinitis.   losartan (COZAAR) 50 MG tablet TAKE ONE TABLET DAILY FOR BLOOD PRESSURE   methimazole (TAPAZOLE) 5 MG tablet Take 0.5 tablets (2.5 mg total) by mouth daily.   methocarbamol (ROBAXIN) 500 MG tablet Take 1 tablet (500 mg total) by mouth 2 (two) times daily. Muscle relaxant   nitroGLYCERIN (NITROSTAT) 0.4 MG SL tablet Place 1 tablet (0.4 mg total) under the tongue every 5 (five) minutes as needed for chest pain. MAX 2 TAB   pantoprazole (PROTONIX) 40 MG tablet Take 1 tablet (40 mg total) by mouth daily.   polyethylene glycol (MIRALAX / GLYCOLAX) 17 g  packet Take 17 g by mouth daily.   polyvinyl alcohol (LIQUIFILM TEARS) 1.4 % ophthalmic solution Place 1 drop into both eyes as needed for dry eyes.   pravastatin (PRAVACHOL) 10 MG tablet TAKE ONE TABLET DAILY   No facility-administered encounter medications on file as of 07/15/2023.    Allergies  Allergen Reactions   Aricept [Donepezil Hcl] Other (See Comments)    Nightmares, near syncope, weak, decreased appetite.   Lisinopril Other (See Comments)    Hair loss   Sulfa Antibiotics Nausea Only   Sulfasalazine Nausea Only    Review of Systems  Constitutional:  Negative for activity change and chills.  Respiratory:  Negative for shortness of breath.   Cardiovascular:  Negative for chest pain.  Gastrointestinal:  Negative for constipation and nausea.  Genitourinary:  Negative for flank pain, frequency and pelvic pain.     Observations/Objective: No vital signs or physical exam, this was a virtual health encounter.  Pt alert and oriented, answers all questions appropriately, and able to speak in full sentences.    Assessment and Plan: Pete was seen today for urinary tract infection.  Diagnoses and all orders for this visit:  Dysuria -     Urinalysis, Routine w reflex microscopic  Hx of glaucoma  . Urine dipstick shows negative for all components.  Micro exam: not done.   Possible exacerbation  of Glaucoma, advise son to call all ophthalmologist and make an appointment   Follow Up Instructions: Return if symptoms worsen or fail to improve.    I discussed the assessment and treatment plan with the patient. The patient was provided an opportunity to ask questions and all were answered. The patient agreed with the plan and demonstrated an understanding of the instructions.   The patient was advised to call back or seek an in-person evaluation if the symptoms worsen or if the condition fails to improve as anticipated.  The above assessment and management plan was discussed with the patient. The patient verbalized understanding of and has agreed to the management plan. Patient is aware to call the clinic if they develop any new symptoms or if symptoms persist or worsen. Patient is aware when to return to the clinic for a follow-up visit. Patient educated on when it is appropriate to go to the emergency department.    I provided 10 minutes of time during this video encounter.   Arrie Aran Santa Lighter, DNP [facility] [address] [place], [place] [address] [phone] 07/15/2023

## 2023-07-26 ENCOUNTER — Encounter: Payer: Self-pay | Admitting: Nurse Practitioner

## 2023-07-26 ENCOUNTER — Ambulatory Visit (INDEPENDENT_AMBULATORY_CARE_PROVIDER_SITE_OTHER): Payer: Medicare PPO | Admitting: Nurse Practitioner

## 2023-07-26 VITALS — BP 144/78 | HR 58 | Temp 97.5°F | Resp 20 | Ht 61.0 in | Wt 99.0 lb

## 2023-07-26 DIAGNOSIS — E440 Moderate protein-calorie malnutrition: Secondary | ICD-10-CM | POA: Diagnosis not present

## 2023-07-26 DIAGNOSIS — I480 Paroxysmal atrial fibrillation: Secondary | ICD-10-CM

## 2023-07-26 DIAGNOSIS — I10 Essential (primary) hypertension: Secondary | ICD-10-CM

## 2023-07-26 DIAGNOSIS — Z23 Encounter for immunization: Secondary | ICD-10-CM | POA: Diagnosis not present

## 2023-07-26 DIAGNOSIS — K222 Esophageal obstruction: Secondary | ICD-10-CM

## 2023-07-26 DIAGNOSIS — I509 Heart failure, unspecified: Secondary | ICD-10-CM

## 2023-07-26 DIAGNOSIS — E0590 Thyrotoxicosis, unspecified without thyrotoxic crisis or storm: Secondary | ICD-10-CM

## 2023-07-26 DIAGNOSIS — I119 Hypertensive heart disease without heart failure: Secondary | ICD-10-CM

## 2023-07-26 DIAGNOSIS — K5730 Diverticulosis of large intestine without perforation or abscess without bleeding: Secondary | ICD-10-CM

## 2023-07-26 DIAGNOSIS — E538 Deficiency of other specified B group vitamins: Secondary | ICD-10-CM

## 2023-07-26 DIAGNOSIS — E559 Vitamin D deficiency, unspecified: Secondary | ICD-10-CM | POA: Diagnosis not present

## 2023-07-26 DIAGNOSIS — R001 Bradycardia, unspecified: Secondary | ICD-10-CM | POA: Diagnosis not present

## 2023-07-26 DIAGNOSIS — E782 Mixed hyperlipidemia: Secondary | ICD-10-CM

## 2023-07-26 DIAGNOSIS — I6523 Occlusion and stenosis of bilateral carotid arteries: Secondary | ICD-10-CM

## 2023-07-26 MED ORDER — METHIMAZOLE 5 MG PO TABS
2.5000 mg | ORAL_TABLET | Freq: Every day | ORAL | 3 refills | Status: DC
Start: 2023-07-26 — End: 2024-02-14

## 2023-07-26 MED ORDER — APIXABAN 2.5 MG PO TABS
ORAL_TABLET | ORAL | 1 refills | Status: DC
Start: 2023-07-26 — End: 2024-02-14

## 2023-07-26 MED ORDER — PANTOPRAZOLE SODIUM 40 MG PO TBEC
40.0000 mg | DELAYED_RELEASE_TABLET | Freq: Every day | ORAL | 1 refills | Status: DC
Start: 2023-07-26 — End: 2024-02-14

## 2023-07-26 MED ORDER — LOSARTAN POTASSIUM 50 MG PO TABS: ORAL_TABLET | ORAL | 1 refills | Status: DC

## 2023-07-26 MED ORDER — PRAVASTATIN SODIUM 10 MG PO TABS
10.0000 mg | ORAL_TABLET | Freq: Every day | ORAL | 1 refills | Status: DC
Start: 2023-07-26 — End: 2024-02-14

## 2023-07-26 MED ORDER — AMLODIPINE BESYLATE 5 MG PO TABS
5.0000 mg | ORAL_TABLET | Freq: Every day | ORAL | 1 refills | Status: DC
Start: 2023-07-26 — End: 2024-02-14

## 2023-07-26 MED ORDER — CARVEDILOL 3.125 MG PO TABS: ORAL_TABLET | ORAL | 1 refills | Status: DC

## 2023-07-26 NOTE — Patient Instructions (Signed)
Fall Prevention in the Home, Adult Falls can cause injuries and can happen to people of all ages. There are many things you can do to make your home safer and to help prevent falls. What actions can I take to prevent falls? General information Use good lighting in all rooms. Make sure to: Replace any light bulbs that burn out. Turn on the lights in dark areas and use night-lights. Keep items that you use often in easy-to-reach places. Lower the shelves around your home if needed. Move furniture so that there are clear paths around it. Do not use throw rugs or other things on the floor that can make you trip. If any of your floors are uneven, fix them. Add color or contrast paint or tape to clearly mark and help you see: Grab bars or handrails. First and last steps of staircases. Where the edge of each step is. If you use a ladder or stepladder: Make sure that it is fully opened. Do not climb a closed ladder. Make sure the sides of the ladder are locked in place. Have someone hold the ladder while you use it. Know where your pets are as you move through your home. What can I do in the bathroom?     Keep the floor dry. Clean up any water on the floor right away. Remove soap buildup in the bathtub or shower. Buildup makes bathtubs and showers slippery. Use non-skid mats or decals on the floor of the bathtub or shower. Attach bath mats securely with double-sided, non-slip rug tape. If you need to sit down in the shower, use a non-slip stool. Install grab bars by the toilet and in the bathtub and shower. Do not use towel bars as grab bars. What can I do in the bedroom? Make sure that you have a light by your bed that is easy to reach. Do not use any sheets or blankets on your bed that hang to the floor. Have a firm chair or bench with side arms that you can use for support when you get dressed. What can I do in the kitchen? Clean up any spills right away. If you need to reach something  above you, use a step stool with a grab bar. Keep electrical cords out of the way. Do not use floor polish or wax that makes floors slippery. What can I do with my stairs? Do not leave anything on the stairs. Make sure that you have a light switch at the top and the bottom of the stairs. Make sure that there are handrails on both sides of the stairs. Fix handrails that are broken or loose. Install non-slip stair treads on all your stairs if they do not have carpet. Avoid having throw rugs at the top or bottom of the stairs. Choose a carpet that does not hide the edge of the steps on the stairs. Make sure that the carpet is firmly attached to the stairs. Fix carpet that is loose or worn. What can I do on the outside of my home? Use bright outdoor lighting. Fix the edges of walkways and driveways and fix any cracks. Clear paths of anything that can make you trip, such as tools or rocks. Add color or contrast paint or tape to clearly mark and help you see anything that might make you trip as you walk through a door, such as a raised step or threshold. Trim any bushes or trees on paths to your home. Check to see if handrails are loose   or broken and that both sides of all steps have handrails. Install guardrails along the edges of any raised decks and porches. Have leaves, snow, or ice cleared regularly. Use sand, salt, or ice melter on paths if you live where there is ice and snow during the winter. Clean up any spills in your garage right away. This includes grease or oil spills. What other actions can I take? Review your medicines with your doctor. Some medicines can cause dizziness or changes in blood pressure, which increase your risk of falling. Wear shoes that: Have a low heel. Do not wear high heels. Have rubber bottoms and are closed at the toe. Feel good on your feet and fit well. Use tools that help you move around if needed. These include: Canes. Walkers. Scooters. Crutches. Ask  your doctor what else you can do to help prevent falls. This may include seeing a physical therapist to learn to do exercises to move better and get stronger. Where to find more information Centers for Disease Control and Prevention, [employer]: cdc.gov National Institute on Aging: nia.nih.gov National Institute on Aging: nia.nih.gov Contact a doctor if: You are afraid of falling at home. You feel weak, drowsy, or dizzy at home. You fall at home. Get help right away if you: Lose consciousness or have trouble moving after a fall. Have a fall that causes a head injury. These symptoms may be an emergency. Get help right away. Call 911. Do not wait to see if the symptoms will go away. Do not drive yourself to the hospital. This information is not intended to replace advice given to you by your health care provider. Make sure you discuss any questions you have with your health care provider. Document Revised: 05/18/2022 Document Reviewed: 05/18/2022 Elsevier Patient Education  2024 Elsevier Inc.  

## 2023-07-26 NOTE — Progress Notes (Addendum)
Subjective:    Patient ID: Valerie [name], female    DOB: Jul 24, 1928, 89 y.o.   MRN: [id number]   Chief Complaint: medical management of chronic issues     HPI:  Valerie [name] is a 89 y.o. who identifies as a female who was assigned female at birth.   Social history: Lives with: Her son stays with her alot Work history: retired   Water engineer in today for follow up of the following chronic medical issues:  1. Essential hypertension No c/o chest pain , sob or headache. Does occasionally check blood pressure at home. Always below 140 systolic. SHe has not taken her blood pressure meds yet this morning. BP Readings from Last 3 Encounters:  06/23/23 (!) 164/67  05/24/23 (!) 144/65  05/18/23 (!) 155/58     2. Bradycardia Denies syncopal or near syncopal episodes Pulse Readings from Last 3 Encounters:  06/23/23 (!) 55  05/24/23 (!) 59  05/18/23 60     3. Congestive heart failure, unspecified HF chronicity, unspecified heart failure type (HCC) 4. Paroxysmal atrial fibrillation (HCC) Last saw cardiology on 12/01/22. According  to office note, no changes were made to plan of care.  5. Asymptomatic bilateral carotid artery stenosis Last carotid US was done on 10/21/17. Showed less than 40% sclerosis  6. Diverticulosis of colon Denies any recent flare ups  7. Hyperthyroidism No issues that she is aware of. Lab Results  Component Value Date   TSH 2.640 01/25/2023     8. Malnutrition of moderate degree She has a very poor appetite . Family has a hard time getting her to eat. Wt Readings from Last 3 Encounters:  07/26/23 99 lb (44.9 kg)  06/23/23 99 lb 12.8 oz (45.3 kg)  05/24/23 100 lb (45.4 kg)   BMI Readings from Last 3 Encounters:  07/26/23 18.71 kg/m  06/23/23 18.86 kg/m  05/24/23 19.53 kg/m     9. Vitamin B12 deficiency Lab Results  Component Value Date   VITAMINB12 416 01/25/2023     10. Vitamin D deficiency Last vitamin D Lab Results  Component Value  Date   VD25OH 50.7 01/25/2023      New complaints: None today  Allergies  Allergen Reactions   Aricept [Donepezil Hcl] Other (See Comments)    Nightmares, near syncope, weak, decreased appetite.   Lisinopril Other (See Comments)    Hair loss   Sulfa Antibiotics Nausea Only   Sulfasalazine Nausea Only   Outpatient Encounter Medications as of 07/26/2023  Medication Sig   acetaminophen (TYLENOL) 325 MG tablet Take 2 tablets (650 mg total) by mouth every 6 (six) hours as needed for headache or mild pain (Rib cage pain).   amLODipine (NORVASC) 5 MG tablet Take 1 tablet (5 mg total) by mouth daily. For BP   apixaban (ELIQUIS) 2.5 MG TABS tablet TAKE  (1)  TABLET TWICE A DAY.   carvedilol (COREG) 3.125 MG tablet TAKE  (1)  TABLET TWICE A DAY WITH MEALS (BREAKFAST AND SUPPER)   fluticasone (FLONASE) 50 MCG/ACT nasal spray Place 2 sprays into both nostrils as needed for allergies or rhinitis.   losartan (COZAAR) 50 MG tablet TAKE ONE TABLET DAILY FOR BLOOD PRESSURE   methimazole (TAPAZOLE) 5 MG tablet Take 0.5 tablets (2.5 mg total) by mouth daily.   methocarbamol (ROBAXIN) 500 MG tablet Take 1 tablet (500 mg total) by mouth 2 (two) times daily. Muscle relaxant   nitroGLYCERIN (NITROSTAT) 0.4 MG SL tablet Place 1 tablet (0.4 mg total) under  the tongue every 5 (five) minutes as needed for chest pain. MAX 2 TAB   pantoprazole (PROTONIX) 40 MG tablet Take 1 tablet (40 mg total) by mouth daily.   polyethylene glycol (MIRALAX / GLYCOLAX) 17 g packet Take 17 g by mouth daily.   polyvinyl alcohol (LIQUIFILM TEARS) 1.4 % ophthalmic solution Place 1 drop into both eyes as needed for dry eyes.   pravastatin (PRAVACHOL) 10 MG tablet TAKE ONE TABLET DAILY   No facility-administered encounter medications on file as of 07/26/2023.    Past Surgical History:  Procedure Laterality Date   CHOLECYSTECTOMY     COLONOSCOPY     DILATION AND CURETTAGE OF UTERUS     EYE SURGERY Bilateral    total of 5    HIP ARTHROPLASTY Right 03/16/2021   Procedure: POSTERIOR ARTHROPLASTY BIPOLAR HIP (HEMIARTHROPLASTY);  Surgeon: Eldred Manges, MD;  Location: [facility] OR;  Service: Orthopedics;  Laterality: Right;   LASER PHOTO ABLATION Right 10/20/2016   Procedure: LASER PHOTO ABLATION;  Surgeon: Sherrie George, MD;  Location: [facility] OR;  Service: Ophthalmology;  Laterality: Right;  Headscope laser   LEFT ROTATOR CUFF REPAIR X2 Left    PARS PLANA VITRECTOMY  10/20/2016   WITH 25G REMOVAL/SUTURE SECONDARY INTRAOCULAR LENS, GAS FLUID EXCHANGE    PARS PLANA VITRECTOMY Right 10/20/2016   Procedure: PARS PLANA VITRECTOMY WITH 25G REMOVAL/SUTURE SECONDARY INTRAOCULAR LENS, GAS FLUID EXCHANGE;  Surgeon: Sherrie George, MD;  Location: [facility] OR;  Service: Ophthalmology;  Laterality: Right;   RIGHT ROTATOR CUFF REPAIR X1 Right    UPPER GASTROINTESTINAL ENDOSCOPY     with dilation    Family History  Problem Relation Age of Onset   Leukemia Mother    Colon cancer Father 73   Kidney cancer Brother    Bladder Cancer Brother    Stroke Sister    Esophageal cancer Neg Hx    Stomach cancer Neg Hx       Controlled substance contract: n/a     Review of Systems  Constitutional:  Negative for diaphoresis.  Eyes:  Negative for pain.  Respiratory:  Negative for shortness of breath.   Cardiovascular:  Negative for chest pain, palpitations and leg swelling.  Gastrointestinal:  Negative for abdominal pain.  Endocrine: Negative for polydipsia.  Skin:  Negative for rash.  Neurological:  Negative for dizziness, weakness and headaches.  Hematological:  Does not bruise/bleed easily.  All other systems reviewed and are negative.      Objective:   Physical Exam Vitals and nursing note reviewed.  Constitutional:      General: She is not in acute distress.    Appearance: Normal appearance. She is well-developed.  HENT:     Head: Normocephalic.     Right Ear: Tympanic membrane normal.     Left Ear: Tympanic membrane normal.      Nose: Nose normal.     Mouth/Throat:     Mouth: Mucous membranes are moist.  Eyes:     Pupils: Pupils are equal, round, and reactive to light.  Neck:     Vascular: No carotid bruit or JVD.  Cardiovascular:     Rate and Rhythm: Normal rate and regular rhythm.     Heart sounds: Murmur (2/6) heard.  Pulmonary:     Effort: Pulmonary effort is normal. No respiratory distress.     Breath sounds: Normal breath sounds. No wheezing or rales.  Chest:     Chest wall: No tenderness.  Abdominal:  General: Bowel sounds are normal. There is no distension or abdominal bruit.     Palpations: Abdomen is soft. There is no hepatomegaly, splenomegaly, mass or pulsatile mass.     Tenderness: There is no abdominal tenderness.  Musculoskeletal:        General: Normal range of motion.     Cervical back: Normal range of motion and neck supple.  Lymphadenopathy:     Cervical: No cervical adenopathy.  Skin:    General: Skin is warm and dry.  Neurological:     Mental Status: She is alert and oriented to person, place, and time.     Deep Tendon Reflexes: Reflexes are normal and symmetric.  Psychiatric:        Behavior: Behavior normal.        Thought Content: Thought content normal.        Judgment: Judgment normal.     BP (!) 144/78   Pulse (!) 58   Temp (!) 97.5 F (36.4 C) (Temporal)   Resp 20   Ht 5\' 1"  (1.549 m)   Wt 99 lb (44.9 kg)   SpO2 99%   BMI 18.71 kg/m         Assessment & Plan:   JYKERIA GRANITO comes in today with chief complaint of Medical Management of Chronic Issues   Diagnosis and orders addressed:  1. Essential hypertension Low sodium diet - amLODipine (NORVASC) 5 MG tablet; Take 1 tablet (5 mg total) by mouth daily. For BP  Dispense: 90 tablet; Refill: 1 - losartan (COZAAR) 50 MG tablet; TAKE ONE TABLET DAILY FOR BLOOD PRESSURE  Dispense: 90 tablet; Refill: 1 - CBC with Differential/Platelet - CMP14+EGFR  2. Bradycardia  3. Congestive heart failure,  unspecified HF chronicity, unspecified heart failure type (HCC) Keep yearly follow up with cardiology - apixaban (ELIQUIS) 2.5 MG TABS tablet; TAKE  (1)  TABLET TWICE A DAY.  Dispense: 180 tablet; Refill: 1  4. Paroxysmal atrial fibrillation (HCC)  5. Asymptomatic bilateral carotid artery stenosis No bruits audible  6. Diverticulosis of colon Continue to wtah cdiet to prevent flare up  7. Hyperthyroidism Labs pending - methimazole (TAPAZOLE) 5 MG tablet; Take 0.5 tablets (2.5 mg total) by mouth daily.  Dispense: 45 tablet; Refill: 3 - Thyroid Panel With TSH  8. Malnutrition of moderate degree Encouraged to increase calories daily  9. Vitamin B12 deficiency Labs pending  10. Vitamin D deficiency labs pending  11. Benign hypertensive heart disease without heart failure - carvedilol (COREG) 3.125 MG tablet; TAKE  (1)  TABLET TWICE A DAY WITH MEALS (BREAKFAST AND SUPPER)  Dispense: 180 tablet; Refill: 1  12. Mixed hyperlipidemia Low fta diet - pravastatin (PRAVACHOL) 10 MG tablet; Take 1 tablet (10 mg total) by mouth daily.  Dispense: 90 tablet; Refill: 1 - Lipid panel  13. Stricture esophagus - pantoprazole (PROTONIX) 40 MG tablet; Take 1 tablet (40 mg total) by mouth daily.  Dispense: 90 tablet; Refill: 1   Labs pending Health Maintenance reviewed Diet and exercise encouraged  Follow up plan: 6 months   [name], FNP

## 2023-07-27 LAB — CMP14+EGFR
ALT: 10 IU/L (ref 0–32)
AST: 20 IU/L (ref 0–40)
Albumin: 4.2 g/dL (ref 3.6–4.6)
Alkaline Phosphatase: 89 [IU]/L (ref 44–121)
BUN/Creatinine Ratio: 23 (ref 12–28)
BUN: 24 mg/dL (ref 10–36)
Bilirubin Total: 0.4 mg/dL (ref 0.0–1.2)
CO2: 23 mmol/L (ref 20–29)
Calcium: 10.1 mg/dL (ref 8.7–10.3)
Chloride: 103 mmol/L (ref 96–106)
Creatinine, Ser: 1.06 mg/dL — ABNORMAL HIGH (ref 0.57–1.00)
Globulin, Total: 2.7 g/dL (ref 1.5–4.5)
Glucose: 81 mg/dL (ref 70–99)
Potassium: 4.2 mmol/L (ref 3.5–5.2)
Sodium: 140 mmol/L (ref 134–144)
Total Protein: 6.9 g/dL (ref 6.0–8.5)
eGFR: 49 mL/min/{1.73_m2} — ABNORMAL LOW (ref >59)

## 2023-07-27 LAB — CBC WITH DIFFERENTIAL/PLATELET
Basophils Absolute: 0 10*3/uL (ref 0.0–0.2)
Basos: 0 %
EOS (ABSOLUTE): 0.2 10*3/uL (ref 0.0–0.4)
Eos: 2 %
Hematocrit: 40.2 % (ref 34.0–46.6)
Hemoglobin: 13.2 g/dL (ref 11.1–15.9)
Immature Grans (Abs): 0 10*3/uL (ref 0.0–0.1)
Immature Granulocytes: 0 %
Lymphocytes Absolute: 1.8 10*3/uL (ref 0.7–3.1)
Lymphs: 25 %
MCH: 33.2 pg — ABNORMAL HIGH (ref 26.6–33.0)
MCHC: 32.8 g/dL (ref 31.5–35.7)
MCV: 101 fL — ABNORMAL HIGH (ref 79–97)
Monocytes Absolute: 0.5 10*3/uL (ref 0.1–0.9)
Monocytes: 8 %
Neutrophils Absolute: 4.5 10*3/uL (ref 1.4–7.0)
Neutrophils: 65 %
Platelets: 355 10*3/uL (ref 150–450)
RBC: 3.97 x10E6/uL (ref 3.77–5.28)
RDW: 12.1 % (ref 11.7–15.4)
WBC: 7 10*3/uL (ref 3.4–10.8)

## 2023-07-27 LAB — THYROID PANEL WITH TSH
Free Thyroxine Index: 1.8 (ref 1.2–4.9)
T3 Uptake Ratio: 25 % (ref 24–39)
T4, Total: 7.1 ug/dL (ref 4.5–12.0)
TSH: 2.12 u[IU]/mL (ref 0.450–4.500)

## 2023-07-27 LAB — LIPID PANEL
Chol/HDL Ratio: 3.2 ratio (ref 0.0–4.4)
Cholesterol, Total: 202 mg/dL — ABNORMAL HIGH (ref 100–199)
HDL: 63 mg/dL (ref >39)
LDL Chol Calc (NIH): 120 mg/dL — ABNORMAL HIGH (ref 0–99)
Triglycerides: 106 mg/dL (ref 0–149)
VLDL Cholesterol Cal: 19 mg/dL (ref 5–40)

## 2023-07-27 LAB — VITAMIN D 25 HYDROXY (VIT D DEFICIENCY, FRACTURES): Vit D, 25-Hydroxy: 51 ng/mL (ref 30.0–100.0)

## 2023-07-27 LAB — VITAMIN B12: Vitamin B-12: 399 pg/mL (ref 232–1245)

## 2023-09-13 ENCOUNTER — Telehealth: Payer: Medicare PPO | Admitting: Family Medicine

## 2023-09-13 DIAGNOSIS — J069 Acute upper respiratory infection, unspecified: Secondary | ICD-10-CM

## 2023-09-13 NOTE — Progress Notes (Signed)
Because testing for both Flu and Covid is recommended given your history your condition warrants further evaluation and I recommend that you be seen in a face to face visit at the local urgent care and or your PCP office.   NOTE: There will be NO CHARGE for this eVisit   If you are having a true medical emergency please call 911.

## 2023-10-02 ENCOUNTER — Other Ambulatory Visit: Payer: Self-pay | Admitting: Nurse Practitioner

## 2023-10-02 DIAGNOSIS — F339 Major depressive disorder, recurrent, unspecified: Secondary | ICD-10-CM

## 2023-10-19 ENCOUNTER — Other Ambulatory Visit: Payer: Self-pay | Admitting: Nurse Practitioner

## 2023-10-19 ENCOUNTER — Ambulatory Visit: Payer: Medicare PPO | Admitting: Nurse Practitioner

## 2023-10-19 ENCOUNTER — Encounter: Payer: Self-pay | Admitting: Nurse Practitioner

## 2023-10-19 VITALS — BP 192/74 | HR 60 | Temp 97.1°F | Ht 61.0 in | Wt 100.0 lb

## 2023-10-19 DIAGNOSIS — M545 Low back pain, unspecified: Secondary | ICD-10-CM | POA: Diagnosis not present

## 2023-10-19 DIAGNOSIS — F339 Major depressive disorder, recurrent, unspecified: Secondary | ICD-10-CM

## 2023-10-19 LAB — MICROSCOPIC EXAMINATION
Bacteria, UA: NONE SEEN
Renal Epithel, UA: NONE SEEN /[HPF]
Yeast, UA: NONE SEEN

## 2023-10-19 LAB — URINALYSIS, COMPLETE
Bilirubin, UA: NEGATIVE
Glucose, UA: NEGATIVE
Ketones, UA: NEGATIVE
Nitrite, UA: NEGATIVE
RBC, UA: NEGATIVE
Specific Gravity, UA: 1.02 (ref 1.005–1.030)
Urobilinogen, Ur: 0.2 mg/dL (ref 0.2–1.0)
pH, UA: 6 (ref 5.0–7.5)

## 2023-10-19 MED ORDER — CITALOPRAM HYDROBROMIDE 20 MG PO TABS: 20.0000 mg | ORAL_TABLET | Freq: Every day | ORAL | 5 refills | Status: DC

## 2023-10-19 NOTE — Patient Instructions (Signed)
Chronic Back Pain Chronic back pain is back pain that lasts longer than 3 months. The pain may get worse at certain times (flare-ups). There are things you can do at home to manage your pain. Follow these instructions at home: Watch for any changes in your symptoms. Take these actions to help with your pain: Managing pain and stiffness     If told, put ice on the painful area. You may be told to use ice for 24-48 hours after a flare-up starts. Put ice in a plastic bag. Place a towel between your skin and the bag. Leave the ice on for 20 minutes, 2-3 times a day. If told, put heat on the painful area. Do this as often as told by your doctor. Use the heat source that your doctor recommends, such as a moist heat pack or a heating pad. Place a towel between your skin and the heat source. Leave the heat on for 20-30 minutes. If your skin turns bright red, take off the ice or heat right away to prevent skin damage. The risk of damage is higher if you cannot feel pain, heat, or cold. Soak in a warm bath. This can help with pain. Activity        Avoid bending and other activities that make the pain worse. When you stand: Keep your upper back and neck straight. Keep your shoulders pulled back. Avoid slouching. When you sit: Keep your back straight. Relax your shoulders. Do not round your shoulders or pull them backward. Do not sit or stand in one place for too long. Take short rest breaks during the day. Lying down or standing is often better than sitting. Resting can help relieve pain. When sitting or lying down for a long time, do some mild activity or stretching. This will help to prevent stiffness and pain. Get regular exercise. Ask your doctor what activities are safe for you. You may have to avoid lifting. Ask your provider how much you can safely lift. If you lift things: Bend your knees. Keep the weight close to your body. Avoid twisting. Medicines Take over-the-counter and  prescription medicines only as told by your doctor. You may need to take medicines for pain and swelling. These may be taken by mouth or put on the skin. You may also be given muscle relaxants. Ask your doctor if the medicine prescribed to you: Requires you to avoid driving or using machinery. Can cause trouble pooping (constipation). You may need to take these actions to prevent or treat trouble pooping: Drink enough fluid to keep your pee (urine) pale yellow. Take over-the-counter or prescription medicines. Eat foods that are high in fiber. These include beans, whole grains, and fresh fruits and vegetables. Limit foods that are high in fat and sugars. These include fried or sweet foods. General instructions  Sleep on a firm mattress. Try lying on your side with your knees slightly bent. If you lie on your back, put a pillow under your knees. Do not smoke or use any products that contain nicotine or tobacco. If you need help quitting, ask your doctor. Contact a doctor if: Your pain does not get better with rest or medicine. You have new pain. You have a fever. You lose weight quickly. You have trouble doing your normal activities. One or both of your legs or feet feel weak. One or both of your legs or feet lose feeling (have numbness). Get help right away if: You are not able to control when you pee   or poop. You have bad back pain and: You feel like you may vomit (nauseous). You vomit. You have pain in your chest or your belly (abdomen). You have shortness of breath. You faint. These symptoms may be an emergency. Get help right away. Call 911. Do not wait to see if the symptoms will go away. Do not drive yourself to the hospital. This information is not intended to replace advice given to you by your health care provider. Make sure you discuss any questions you have with your health care provider. Document Revised: 05/04/2022 Document Reviewed: 05/04/2022 Elsevier Patient Education   2024 Elsevier Inc.  

## 2023-10-19 NOTE — Progress Notes (Signed)
   Subjective:    Patient ID: Valerie [name], female    DOB: Feb 17, 1928, 89 y.o.   MRN: [id number]   Chief Complaint: back pain  Patient has been having back pain for months, she does not get up and move around very much. Rates pain 7-8/10. In lower back. Does not radiate. Wants to make sure that she does not have a UTI.     Patient Active Problem List   Diagnosis Date Noted   Hx of glaucoma 07/15/2023   Vitamin D deficiency 01/25/2023   Depression, recurrent (HCC) 01/25/2023   Closed Rt rib fracture -3rd thru 7th 08/30/2022   Compression fracture of T11 vertebra (HCC) 07/21/2022   Malnutrition of moderate degree 03/18/2021   Essential hypertension 03/16/2021   Femoral neck fracture (HCC) 03/15/2021   Hip fracture (HCC) 03/15/2021   Moderate to severe aortic insufficiency 12/05/2019   Paroxysmal Atrial fibrillation (HCC) 03/06/2019   Congestive heart failure (CHF) (HCC) 02/17/2019   Left thyroid nodule 01/24/2019   Hyperthyroidism 01/14/2019   Bradycardia 04/06/2018   PVC (premature ventricular contraction) 11/26/2016   Hyperlipidemia 11/26/2016   Dislocated IOL (intraocular lens), posterior, right 10/20/2016   Posterior dislocation of iol (intraocular lens), right eye 10/20/2016   History of palpitations 06/30/2016   Glaucoma 05/25/2016   Thyroid nodule 05/20/2015   Carotid stenosis, non-symptomatic 09/14/2012   Internal and external hemorrhoids without complication 10/05/2011   Vitamin B12 deficiency 09/15/2011   Benign hypertensive heart disease without heart failure 05/22/2011   Stricture esophagus 05/18/2011   Stricture of duodenum 05/18/2011   Iron deficiency anemia 05/15/2011   Diverticulosis of colon 02/03/2008       Review of Systems  Musculoskeletal:  Positive for back pain.       Objective:   Physical Exam Constitutional:      Appearance: Normal appearance.  Cardiovascular:     Rate and Rhythm: Normal rate and regular rhythm.     Heart sounds: Normal  heart sounds.  Pulmonary:     Breath sounds: Normal breath sounds.  Musculoskeletal:     Comments: Gait slow and steady  Neurological:     General: No focal deficit present.     Mental Status: She is alert and oriented to person, place, and time.  Psychiatric:        Mood and Affect: Mood normal.        Behavior: Behavior normal.     BP (!) 192/74   Pulse 60   Temp (!) 97.1 F (36.2 C) (Temporal)   Ht 5\' 1"  (1.549 m)   Wt 100 lb (45.4 kg)   SpO2 93%   BMI 18.89 kg/m   Urine clear       Assessment & Plan:   Valerie [name] in today with chief complaint of Back Pain   1. Acute bilateral low back pain without sciatica (Primary) Moist heat Daily stretches RTO prn - Urinalysis, Complete - Urine Culture    The above assessment and management plan was discussed with the patient. The patient verbalized understanding of and has agreed to the management plan. Patient is aware to call the clinic if symptoms persist or worsen. Patient is aware when to return to the clinic for a follow-up visit. Patient educated on when it is appropriate to go to the emergency department.   [name], FNP

## 2023-10-21 LAB — URINE CULTURE

## 2023-11-04 ENCOUNTER — Telehealth: Payer: Medicare PPO | Admitting: Physician Assistant

## 2023-11-04 DIAGNOSIS — R3989 Other symptoms and signs involving the genitourinary system: Secondary | ICD-10-CM

## 2023-11-04 NOTE — Progress Notes (Signed)
  Because of risk of complicated UTI and antibiotic resistance in those over 65, the standard of care is for an examination to be performed and a urine culture obtained to make sure proper treatments are given. As such, I feel your condition warrants further evaluation and I recommend that you be seen in a face-to-face visit.   NOTE: There will be NO CHARGE for this E-Visit   If you are having a true medical emergency, please call 911.     For an urgent face to face visit, [place] has multiple urgent care centers for your convenience.  Click the link below for the full list of locations and hours, walk-in wait times, appointment scheduling options and driving directions:  Urgent Care - [place], [place], [place], [place], [place], [place]  [place]     Your MyChart E-visit questionnaire answers were reviewed by a board certified advanced clinical practitioner to complete your personal care plan based on your specific symptoms.    Thank you for using e-Visits.

## 2023-11-05 DIAGNOSIS — N3000 Acute cystitis without hematuria: Secondary | ICD-10-CM | POA: Diagnosis not present

## 2023-11-23 DIAGNOSIS — M791 Myalgia, unspecified site: Secondary | ICD-10-CM | POA: Diagnosis not present

## 2023-11-23 DIAGNOSIS — R051 Acute cough: Secondary | ICD-10-CM | POA: Diagnosis not present

## 2023-11-23 DIAGNOSIS — Z20828 Contact with and (suspected) exposure to other viral communicable diseases: Secondary | ICD-10-CM | POA: Diagnosis not present

## 2023-11-23 DIAGNOSIS — R0981 Nasal congestion: Secondary | ICD-10-CM | POA: Diagnosis not present

## 2023-11-26 ENCOUNTER — Encounter: Payer: Self-pay | Admitting: Cardiovascular Disease

## 2023-11-26 ENCOUNTER — Encounter: Payer: Self-pay | Admitting: Internal Medicine

## 2023-12-03 ENCOUNTER — Ambulatory Visit: Payer: Medicare PPO | Admitting: Internal Medicine

## 2023-12-03 ENCOUNTER — Ambulatory Visit: Payer: Medicare PPO | Admitting: Cardiovascular Disease

## 2023-12-24 DIAGNOSIS — J020 Streptococcal pharyngitis: Secondary | ICD-10-CM | POA: Diagnosis not present

## 2023-12-24 DIAGNOSIS — F0390 Unspecified dementia without behavioral disturbance: Secondary | ICD-10-CM | POA: Diagnosis not present

## 2023-12-24 DIAGNOSIS — I4891 Unspecified atrial fibrillation: Secondary | ICD-10-CM | POA: Diagnosis not present

## 2023-12-24 DIAGNOSIS — R400 Somnolence: Secondary | ICD-10-CM | POA: Diagnosis not present

## 2023-12-24 DIAGNOSIS — R4182 Altered mental status, unspecified: Secondary | ICD-10-CM | POA: Diagnosis not present

## 2023-12-24 DIAGNOSIS — R531 Weakness: Secondary | ICD-10-CM | POA: Diagnosis not present

## 2023-12-24 DIAGNOSIS — E0500 Thyrotoxicosis with diffuse goiter without thyrotoxic crisis or storm: Secondary | ICD-10-CM | POA: Diagnosis not present

## 2023-12-24 DIAGNOSIS — K219 Gastro-esophageal reflux disease without esophagitis: Secondary | ICD-10-CM | POA: Diagnosis not present

## 2023-12-24 DIAGNOSIS — H409 Unspecified glaucoma: Secondary | ICD-10-CM | POA: Diagnosis not present

## 2023-12-24 DIAGNOSIS — I509 Heart failure, unspecified: Secondary | ICD-10-CM | POA: Diagnosis not present

## 2023-12-24 DIAGNOSIS — E785 Hyperlipidemia, unspecified: Secondary | ICD-10-CM | POA: Diagnosis not present

## 2023-12-24 DIAGNOSIS — R300 Dysuria: Secondary | ICD-10-CM | POA: Diagnosis not present

## 2023-12-27 ENCOUNTER — Other Ambulatory Visit: Payer: Self-pay | Admitting: Nurse Practitioner

## 2023-12-27 DIAGNOSIS — F339 Major depressive disorder, recurrent, unspecified: Secondary | ICD-10-CM

## 2023-12-30 ENCOUNTER — Ambulatory Visit: Payer: Self-pay

## 2023-12-30 ENCOUNTER — Other Ambulatory Visit: Payer: Self-pay

## 2023-12-30 DIAGNOSIS — R300 Dysuria: Secondary | ICD-10-CM

## 2023-12-30 NOTE — Telephone Encounter (Signed)
 Called and left detailed message on daughters voicemail to drop off urine by the office. Future orders placed

## 2023-12-30 NOTE — Telephone Encounter (Signed)
 Please review and advise.

## 2023-12-30 NOTE — Telephone Encounter (Signed)
  Chief Complaint: Schedule Symptoms: See previous nurse triage Frequency: See previous nurse triage Pertinent Negatives: Patient denies see previous nurse triage notes.  Disposition: [] ED /[] Urgent Care (no appt availability in office) / [x] Appointment(In office/virtual)/ []  [place]/ [] Home Care/ [] Refused Recommended Disposition /[] [facility]/ []  Follow-up with PCP Additional Notes:  Previous nurse triage note states message was left with daughter Valerie [name] to see if she could assist with virtual visit. See nurse triage note from 12/30/23 3:41pm discussing ok for virtual visit, drop off urine sample.  This Clinical research associate returned call to daughter Valerie [name], explained situation, Valerie [name] states she is able to assist with video visit however brother Valerie [name] may be the one to assist as he lives closer to Valerie, [name] will coordinate assisting with virtual visit. Virtual visit scheduled on 12/31/23 with PCP. No further questions or needs at this time.    See nurse triage note from 12/30/23 3:41pm discussing virtual visit.   Copied from CRM [id number]. Topic: General - Other >> Dec 30, 2023  3:58 PM Valerie [name] wrote: Reason for CRM: Attention: [place] Dec- Patient'[name] daughter returning missed phone call, requesting a callback. Reason for Disposition . [1] Follow-up call to recent contact AND [2] information only call, no triage required  Protocols used: Information Only Call - No Triage-A-AH

## 2023-12-30 NOTE — Telephone Encounter (Signed)
 Need to at least bring urine into office

## 2023-12-30 NOTE — Telephone Encounter (Signed)
 Chief Complaint: possible UTI Symptoms: hallucinations Frequency: since yesterday Pertinent Negatives: Patient denies dysuria Disposition: [] ED /[] Urgent Care (no appt availability in office) / [x] Appointment(In office/virtual)/ []  [facility]/ [] Home Care/ [x] Refused Recommended Disposition /[] [facility]/ []  Follow-up with PCP Additional Notes:  Pt's son Jorja Loa called in about the pt. Son states pt believes she has another UTI. Pt believes she has a UTI because she is "seeing things" and typically hallucinates with UTIs. Son states pt is hallucinating seeing flowers on the wall. Son also states pt has poor eyesight d/t glaucoma. Son is not aware of all the pt's symptoms and what the pt is experiencing, but denies that she is reported dysuria, frequency, fever, chills, flank pain. RN advised son pt should come into the office. Son states pt will not want to come into the office. Another RN spoke with pt's daughter Cordelia Pen earlier today who said the same thing.  Son denies that pt is exhibiting dangerous or combative behavior and denies a safety concern. RN stated should that happen, he or someone else needs to call 911. Son verbalized understanding.  RN called the CAL. CAL said RN could offer a virtual visit and ask family to drop a urine specimen off at the office. RN called son, son said that he does not have a phone with a camera or the technology to assist with that. RN called pt's daughter Cordelia Pen to see if Cordelia Pen could assist with a virtual visit. Sherry did not answer, RN left a voicemail.     Copied from CRM [id number]. Topic: Clinical - Pink Word Triage >> Dec 30, 2023  2:50 PM Dondra Prader E wrote: Reason for Triage: Pt's son called reporting that the patient has another potential UTI and wants medicine called in for her.   [facility] [place], [place] - [address] [place] [address] Phone: [phone] Fax: [phone] Reason  for Disposition  [1] Longstanding confusion (e.g., dementia, stroke) AND [2] worsening  Answer Assessment - Initial Assessment Questions 1. LEVEL OF CONSCIOUSNESS: "How is he (she, the patient) acting right now?" (e.g., alert-oriented, confused, lethargic, stuporous, comatose)     "Not worse than usual if you know what I mean", "she has spells of being confused" 2. ONSET: "When did the confusion start?"  (minutes, hours, days)     First complained yesterday 3. PATTERN "Does this come and go, or has it been constant since it started?"  "Is it present now?"     Comes and goes  4. ALCOHOL or DRUGS: "Has he been drinking alcohol or taking any drugs?"      No 5. NARCOTIC MEDICINES: "Has he been receiving any narcotic medications?" (e.g., morphine, Vicodin)     No 6. CAUSE: "What do you think is causing the confusion?"      Possible UTI 7. OTHER SYMPTOMS: "Are there any other symptoms?" (e.g., difficulty breathing, headache, fever, weakness) Seeing flowers on the walls. Denies CP. Denies SOB. Hx of glaucoma, bad vision. Denies fever. Denies dysuria.      Ambulance came last week - she was out of it, wouldn't talk. Hospital said she had strep throat, taking medications for that.  Protocols used: Confusion - Delirium-A-AH

## 2023-12-30 NOTE — Telephone Encounter (Signed)
 This RN initially returned call to call listed on initial CRM - patient answered and was very confused and poor historian (daughter mentioned she has baseline dementia). This RN contacted daughter next and she stated patient has been stating she has a UTI because she is seeing things and knows she has a UTI. Daughter mentioned patient was seen in ED for Strep Throat and has been on antibiotics for one week. Daughter states patient refuses to come into the office for an appointment. Daughter is asking if PCP would call in abx for UTI. This Rn suggested encouragement of appointment for patient, and especially if patient runs a fever, complains of back pain, or her baseline mentation changes. Patient's daughter verbalized understanding. Daughter asks if abx can be called in, please contact daughter at [phone].  Reason for Disposition  Caller requesting an appointment, triage offered and declined  Answer Assessment - Initial Assessment Questions 1. REASON FOR CALL or QUESTION: "What is your reason for calling today?" or "How can I best help you?" or "What question do you have that I can help answer?"     See notes 2. CALLER: Document the source of call. (e.g., laboratory, patient).     Daughter  Protocols used: PCP Call - No Triage-A-AH

## 2023-12-31 ENCOUNTER — Telehealth: Payer: Self-pay | Admitting: Nurse Practitioner

## 2023-12-31 ENCOUNTER — Telehealth: Admitting: Nurse Practitioner

## 2023-12-31 ENCOUNTER — Encounter: Payer: Self-pay | Admitting: Nurse Practitioner

## 2023-12-31 ENCOUNTER — Other Ambulatory Visit: Payer: Self-pay | Admitting: *Deleted

## 2023-12-31 ENCOUNTER — Ambulatory Visit: Admitting: Nurse Practitioner

## 2023-12-31 DIAGNOSIS — R300 Dysuria: Secondary | ICD-10-CM

## 2023-12-31 DIAGNOSIS — N390 Urinary tract infection, site not specified: Secondary | ICD-10-CM | POA: Diagnosis not present

## 2023-12-31 LAB — URINALYSIS, COMPLETE
Bilirubin, UA: NEGATIVE
Glucose, UA: NEGATIVE
Ketones, UA: NEGATIVE
Nitrite, UA: NEGATIVE
Protein,UA: NEGATIVE
RBC, UA: NEGATIVE
Specific Gravity, UA: 1.015 (ref 1.005–1.030)
Urobilinogen, Ur: 0.2 mg/dL (ref 0.2–1.0)
pH, UA: 6 (ref 5.0–7.5)

## 2023-12-31 LAB — MICROSCOPIC EXAMINATION
Bacteria, UA: NONE SEEN
Epithelial Cells (non renal): NONE SEEN /HPF (ref 0–10)
Renal Epithel, UA: NONE SEEN /HPF
Yeast, UA: NONE SEEN

## 2023-12-31 NOTE — Progress Notes (Addendum)
 Virtual Visit Consent   Valerie [name], you are scheduled for a virtual visit with [name], FNP, a [facility] provider, today.     Just as with appointments in the office, your consent must be obtained to participate.  Your consent will be active for this visit and any virtual visit you may have with one of our providers in the next 365 days.     If you have a MyChart account, a copy of this consent can be sent to you electronically.  All virtual visits are billed to your insurance company just like a traditional visit in the office.    As this is a virtual visit, video technology does not allow for your provider to perform a traditional examination.  This may limit your provider's ability to fully assess your condition.  If your provider identifies any concerns that need to be evaluated in person or the need to arrange testing (such as labs, EKG, etc.), we will make arrangements to do so.     Although advances in technology are sophisticated, we cannot ensure that it will always work on either your end or our end.  If the connection with a video visit is poor, the visit may have to be switched to a telephone visit.  With either a video or telephone visit, we are not always able to ensure that we have a secure connection.     I need to obtain your verbal consent now.   Are you willing to proceed with your visit today? YES   MADISUN HARGROVE has provided verbal consent on 12/31/2023 for a virtual visit (video or telephone).   [name], FNP   Date: 12/31/2023 8:55 AM   Virtual Visit via Video Note   I, [name], connected with MADELYN TLATELPA ([id number], 01/19/1928) on 12/31/23 at 10:00 AM EDT by a video-enabled telemedicine application and verified that I am speaking with the correct person using two identifiers.  Location: Patient: Virtual Visit Location Patient: Home Provider: Virtual Visit Location Provider: Mobile   I discussed the limitations of evaluation  and management by telemedicine and the availability of in person appointments. The patient expressed understanding and agreed to proceed.    History of Present Illness: Valerie [name] is a 89 y.o. who identifies as a female who was assigned female at birth, and is being seen today for dysuria.  HPI: Patient doing video visit c/o urinary symptoms. Patient has dementia and family thinks patient just thinks she has UTI. They want to give her something daily that they can tell her prevents UTI. Patient has been hallucinations the last several days. Has been voiding a lot at night. * went to ED last week a nd was dx with strep and was given z pak.     Review of Systems  Constitutional:  Negative for diaphoresis and weight loss.  Eyes:  Negative for blurred vision, double vision and pain.  Respiratory:  Negative for shortness of breath.   Cardiovascular:  Negative for chest pain, palpitations, orthopnea and leg swelling.  Gastrointestinal:  Negative for abdominal pain.  Skin:  Negative for rash.  Neurological:  Negative for dizziness, sensory change, loss of consciousness, weakness and headaches.  Endo/Heme/Allergies:  Negative for polydipsia. Does not bruise/bleed easily.  Psychiatric/Behavioral:  Negative for memory loss. The patient does not have insomnia.   All other systems reviewed and are negative.   Problems:  Patient Active Problem List   Diagnosis Date Noted  Hx of glaucoma 07/15/2023   Vitamin D deficiency 01/25/2023   Depression, recurrent (HCC) 01/25/2023   Closed Rt rib fracture -3rd thru 7th 08/30/2022   Compression fracture of T11 vertebra (HCC) 07/21/2022   Malnutrition of moderate degree 03/18/2021   Essential hypertension 03/16/2021   Femoral neck fracture (HCC) 03/15/2021   Hip fracture (HCC) 03/15/2021   Moderate to severe aortic insufficiency 12/05/2019   Paroxysmal Atrial fibrillation (HCC) 03/06/2019   Congestive heart failure (CHF) (HCC) 02/17/2019   Left  thyroid nodule 01/24/2019   Hyperthyroidism 01/14/2019   Bradycardia 04/06/2018   PVC (premature ventricular contraction) 11/26/2016   Hyperlipidemia 11/26/2016   Dislocated IOL (intraocular lens), posterior, right 10/20/2016   Posterior dislocation of iol (intraocular lens), right eye 10/20/2016   History of palpitations 06/30/2016   Glaucoma 05/25/2016   Thyroid nodule 05/20/2015   Carotid stenosis, non-symptomatic 09/14/2012   Internal and external hemorrhoids without complication 10/05/2011   Vitamin B12 deficiency 09/15/2011   Benign hypertensive heart disease without heart failure 05/22/2011   Stricture esophagus 05/18/2011   Stricture of duodenum 05/18/2011   Iron deficiency anemia 05/15/2011   Diverticulosis of colon 02/03/2008    Allergies:  Allergies  Allergen Reactions   Aricept [Donepezil Hcl] Other (See Comments)    Nightmares, near syncope, weak, decreased appetite.   Lisinopril Other (See Comments)    Hair loss   Sulfa Antibiotics Nausea Only   Sulfasalazine Nausea Only   Medications:  Current Outpatient Medications:    acetaminophen (TYLENOL) 325 MG tablet, Take 2 tablets (650 mg total) by mouth every 6 (six) hours as needed for headache or mild pain (Rib cage pain)., Disp: 100 tablet, Rfl: 0   amLODipine (NORVASC) 5 MG tablet, Take 1 tablet (5 mg total) by mouth daily. For BP, Disp: 90 tablet, Rfl: 1   apixaban (ELIQUIS) 2.5 MG TABS tablet, TAKE  (1)  TABLET TWICE A DAY., Disp: 180 tablet, Rfl: 1   carvedilol (COREG) 3.125 MG tablet, TAKE  (1)  TABLET TWICE A DAY WITH MEALS (BREAKFAST AND SUPPER), Disp: 180 tablet, Rfl: 1   citalopram (CELEXA) 20 MG tablet, TAKE ONE TABLET ONCE DAILY, Disp: 30 tablet, Rfl: 1   fluticasone (FLONASE) 50 MCG/ACT nasal spray, Place 2 sprays into both nostrils as needed for allergies or rhinitis., Disp: 16 g, Rfl: 5   losartan (COZAAR) 50 MG tablet, TAKE ONE TABLET DAILY FOR BLOOD PRESSURE, Disp: 90 tablet, Rfl: 1   methimazole  (TAPAZOLE) 5 MG tablet, Take 0.5 tablets (2.5 mg total) by mouth daily., Disp: 45 tablet, Rfl: 3   methocarbamol (ROBAXIN) 500 MG tablet, Take 1 tablet (500 mg total) by mouth 2 (two) times daily. Muscle relaxant, Disp: 60 tablet, Rfl: 1   nitroGLYCERIN (NITROSTAT) 0.4 MG SL tablet, Place 1 tablet (0.4 mg total) under the tongue every 5 (five) minutes as needed for chest pain. MAX 2 TAB, Disp: 20 tablet, Rfl: 0   pantoprazole (PROTONIX) 40 MG tablet, Take 1 tablet (40 mg total) by mouth daily., Disp: 90 tablet, Rfl: 1   polyethylene glycol (MIRALAX / GLYCOLAX) 17 g packet, Take 17 g by mouth daily., Disp: 30 each, Rfl: 3   polyvinyl alcohol (LIQUIFILM TEARS) 1.4 % ophthalmic solution, Place 1 drop into both eyes as needed for dry eyes., Disp: , Rfl:    pravastatin (PRAVACHOL) 10 MG tablet, Take 1 tablet (10 mg total) by mouth daily., Disp: 90 tablet, Rfl: 1  Observations/Objective: Patient is well-developed, well-nourished in no acute distress.  Resting  comfortably  at home.  Head is normocephalic, atraumatic.  No labored breathing.  Speech is clear and coherent with logical content.  Patient is alert and oriented at baseline.    Assessment and Plan:  TENEKA MALMBERG in today with chief complaint of No chief complaint on file.   1. Dysuria (Primary) - Urinalysis, Complete; Future - Urine Culture; Future Will talk after get urine specimen results  2. Frequent UTI Continue cranberry pills daily Force fluids Cranberry juice as can tolerate    Follow Up Instructions: I discussed the assessment and treatment plan with the patient. The patient was provided an opportunity to ask questions and all were answered. The patient agreed with the plan and demonstrated an understanding of the instructions.  A copy of instructions were sent to the patient via MyChart.  The patient was advised to call back or seek an in-person evaluation if the symptoms worsen or if the condition fails to improve as  anticipated.  Time:  I spent 12 minutes with the patient via telehealth technology discussing the above problems/concerns.    [name], FNP   Urine was clear- culture pending [name], FNP

## 2023-12-31 NOTE — Patient Instructions (Signed)

## 2024-01-02 DIAGNOSIS — R441 Visual hallucinations: Secondary | ICD-10-CM | POA: Diagnosis not present

## 2024-01-02 DIAGNOSIS — I4891 Unspecified atrial fibrillation: Secondary | ICD-10-CM | POA: Diagnosis not present

## 2024-01-02 DIAGNOSIS — R300 Dysuria: Secondary | ICD-10-CM | POA: Diagnosis not present

## 2024-01-02 DIAGNOSIS — H409 Unspecified glaucoma: Secondary | ICD-10-CM | POA: Diagnosis not present

## 2024-01-02 DIAGNOSIS — R519 Headache, unspecified: Secondary | ICD-10-CM | POA: Diagnosis not present

## 2024-01-02 DIAGNOSIS — K5730 Diverticulosis of large intestine without perforation or abscess without bleeding: Secondary | ICD-10-CM | POA: Diagnosis not present

## 2024-01-02 DIAGNOSIS — F0390 Unspecified dementia without behavioral disturbance: Secondary | ICD-10-CM | POA: Diagnosis not present

## 2024-01-02 DIAGNOSIS — N289 Disorder of kidney and ureter, unspecified: Secondary | ICD-10-CM | POA: Diagnosis not present

## 2024-01-02 DIAGNOSIS — R410 Disorientation, unspecified: Secondary | ICD-10-CM | POA: Diagnosis not present

## 2024-01-02 DIAGNOSIS — E0590 Thyrotoxicosis, unspecified without thyrotoxic crisis or storm: Secondary | ICD-10-CM | POA: Diagnosis not present

## 2024-01-02 DIAGNOSIS — K838 Other specified diseases of biliary tract: Secondary | ICD-10-CM | POA: Diagnosis not present

## 2024-01-02 DIAGNOSIS — E785 Hyperlipidemia, unspecified: Secondary | ICD-10-CM | POA: Diagnosis not present

## 2024-01-02 DIAGNOSIS — I10 Essential (primary) hypertension: Secondary | ICD-10-CM | POA: Diagnosis not present

## 2024-01-03 LAB — URINE CULTURE

## 2024-01-16 DIAGNOSIS — G4700 Insomnia, unspecified: Secondary | ICD-10-CM | POA: Diagnosis not present

## 2024-01-16 DIAGNOSIS — H409 Unspecified glaucoma: Secondary | ICD-10-CM | POA: Diagnosis not present

## 2024-01-16 DIAGNOSIS — I10 Essential (primary) hypertension: Secondary | ICD-10-CM | POA: Diagnosis not present

## 2024-01-16 DIAGNOSIS — E785 Hyperlipidemia, unspecified: Secondary | ICD-10-CM | POA: Diagnosis not present

## 2024-01-16 DIAGNOSIS — F0390 Unspecified dementia without behavioral disturbance: Secondary | ICD-10-CM | POA: Diagnosis not present

## 2024-01-16 DIAGNOSIS — I4891 Unspecified atrial fibrillation: Secondary | ICD-10-CM | POA: Diagnosis not present

## 2024-01-31 ENCOUNTER — Ambulatory Visit: Payer: Medicare PPO | Admitting: Nurse Practitioner

## 2024-02-27 DEATH — deceased
